# Patient Record
Sex: Female | Born: 1960 | Race: White | Hispanic: No | Marital: Married | Smoking: Current every day smoker
Health system: Southern US, Community
[De-identification: ages and names within clinical notes are randomized; demographics above are authoritative.]

## PROBLEM LIST (undated history)

## (undated) DIAGNOSIS — I890 Lymphedema, not elsewhere classified: Secondary | ICD-10-CM

## (undated) DIAGNOSIS — F329 Major depressive disorder, single episode, unspecified: Secondary | ICD-10-CM

## (undated) DIAGNOSIS — C50919 Malignant neoplasm of unspecified site of unspecified female breast: Secondary | ICD-10-CM

## (undated) DIAGNOSIS — N63 Unspecified lump in unspecified breast: Secondary | ICD-10-CM

## (undated) DIAGNOSIS — B009 Herpesviral infection, unspecified: Secondary | ICD-10-CM

## (undated) DIAGNOSIS — C7951 Secondary malignant neoplasm of bone: Secondary | ICD-10-CM

## (undated) DIAGNOSIS — N950 Postmenopausal bleeding: Secondary | ICD-10-CM

## (undated) DIAGNOSIS — A4902 Methicillin resistant Staphylococcus aureus infection, unspecified site: Secondary | ICD-10-CM

## (undated) DIAGNOSIS — F32A Depression, unspecified: Secondary | ICD-10-CM

## (undated) DIAGNOSIS — R238 Other skin changes: Secondary | ICD-10-CM

## (undated) DIAGNOSIS — A6000 Herpesviral infection of urogenital system, unspecified: Secondary | ICD-10-CM

## (undated) DIAGNOSIS — R609 Edema, unspecified: Secondary | ICD-10-CM

## (undated) DIAGNOSIS — E538 Deficiency of other specified B group vitamins: Secondary | ICD-10-CM

## (undated) DIAGNOSIS — I10 Essential (primary) hypertension: Secondary | ICD-10-CM

## (undated) DIAGNOSIS — T148XXA Other injury of unspecified body region, initial encounter: Secondary | ICD-10-CM

## (undated) DIAGNOSIS — F419 Anxiety disorder, unspecified: Secondary | ICD-10-CM

## (undated) HISTORY — DX: Herpesviral infection of urogenital system, unspecified: A60.00

## (undated) HISTORY — DX: Deficiency of other specified B group vitamins: E53.8

## (undated) HISTORY — PX: WISDOM TOOTH EXTRACTION: SHX21

## (undated) HISTORY — DX: Edema, unspecified: R60.9

## (undated) HISTORY — PX: TUBAL LIGATION: SHX77

## (undated) HISTORY — DX: Unspecified lump in unspecified breast: N63.0

## (undated) HISTORY — DX: Essential (primary) hypertension: I10

## (undated) HISTORY — DX: Major depressive disorder, single episode, unspecified: F32.9

## (undated) HISTORY — DX: Malignant neoplasm of unspecified site of unspecified female breast: C50.919

## (undated) HISTORY — DX: Methicillin resistant Staphylococcus aureus infection, unspecified site: A49.02

## (undated) HISTORY — DX: Lymphedema, not elsewhere classified: I89.0

## (undated) HISTORY — DX: Depression, unspecified: F32.A

## (undated) HISTORY — DX: Other injury of unspecified body region, initial encounter: T14.8XXA

## (undated) HISTORY — DX: Secondary malignant neoplasm of bone: C79.51

## (undated) HISTORY — DX: Postmenopausal bleeding: N95.0

## (undated) HISTORY — DX: Herpesviral infection, unspecified: B00.9

## (undated) HISTORY — DX: Other skin changes: R23.8

## (undated) HISTORY — DX: Anxiety disorder, unspecified: F41.9

---

## 2010-05-09 ENCOUNTER — Other Ambulatory Visit: Payer: Self-pay | Admitting: Family Medicine

## 2010-05-09 DIAGNOSIS — R234 Changes in skin texture: Secondary | ICD-10-CM

## 2010-05-23 ENCOUNTER — Ambulatory Visit
Admission: RE | Admit: 2010-05-23 | Discharge: 2010-05-23 | Disposition: A | Discharge status: Home / Self Care | Payer: Self-pay | Source: Ambulatory Visit | Attending: Family Medicine | Admitting: Family Medicine

## 2010-05-23 ENCOUNTER — Other Ambulatory Visit: Payer: Self-pay | Admitting: Family Medicine

## 2010-05-23 DIAGNOSIS — R234 Changes in skin texture: Secondary | ICD-10-CM

## 2010-05-28 ENCOUNTER — Other Ambulatory Visit: Payer: Self-pay | Admitting: Family Medicine

## 2010-05-28 ENCOUNTER — Ambulatory Visit
Admission: RE | Admit: 2010-05-28 | Discharge: 2010-05-28 | Disposition: A | Discharge status: Home / Self Care | Payer: Self-pay | Source: Ambulatory Visit | Attending: Family Medicine | Admitting: Family Medicine

## 2010-05-28 ENCOUNTER — Other Ambulatory Visit: Payer: Self-pay | Admitting: Diagnostic Radiology

## 2010-05-28 DIAGNOSIS — C50919 Malignant neoplasm of unspecified site of unspecified female breast: Secondary | ICD-10-CM

## 2010-05-28 DIAGNOSIS — N6320 Unspecified lump in the left breast, unspecified quadrant: Secondary | ICD-10-CM

## 2010-05-28 DIAGNOSIS — R234 Changes in skin texture: Secondary | ICD-10-CM

## 2010-05-28 HISTORY — DX: Malignant neoplasm of unspecified site of unspecified female breast: C50.919

## 2010-05-29 ENCOUNTER — Other Ambulatory Visit: Payer: Self-pay | Admitting: Family Medicine

## 2010-05-29 DIAGNOSIS — C50912 Malignant neoplasm of unspecified site of left female breast: Secondary | ICD-10-CM

## 2010-06-04 ENCOUNTER — Other Ambulatory Visit: Payer: Self-pay | Admitting: Physician Assistant

## 2010-06-04 ENCOUNTER — Ambulatory Visit
Admission: RE | Admit: 2010-06-04 | Discharge: 2010-06-04 | Disposition: A | Discharge status: Home / Self Care | Payer: Self-pay | Source: Ambulatory Visit | Attending: Family Medicine | Admitting: Family Medicine

## 2010-06-04 ENCOUNTER — Other Ambulatory Visit: Payer: Self-pay | Admitting: Family Medicine

## 2010-06-04 DIAGNOSIS — C50912 Malignant neoplasm of unspecified site of left female breast: Secondary | ICD-10-CM

## 2010-06-04 DIAGNOSIS — R928 Other abnormal and inconclusive findings on diagnostic imaging of breast: Secondary | ICD-10-CM

## 2010-06-04 MED ORDER — GADOBENATE DIMEGLUMINE 529 MG/ML IV SOLN
14.0000 mL | Freq: Once | INTRAVENOUS | Status: AC | PRN
  Administered 2010-06-04: 14 mL via INTRAVENOUS

## 2010-06-05 ENCOUNTER — Other Ambulatory Visit: Payer: Self-pay | Admitting: Diagnostic Radiology

## 2010-06-05 ENCOUNTER — Ambulatory Visit
Admission: RE | Admit: 2010-06-05 | Discharge: 2010-06-05 | Disposition: A | Discharge status: Home / Self Care | Payer: No Typology Code available for payment source | Source: Ambulatory Visit | Attending: Family Medicine | Admitting: Family Medicine

## 2010-06-05 DIAGNOSIS — R928 Other abnormal and inconclusive findings on diagnostic imaging of breast: Secondary | ICD-10-CM

## 2010-06-06 ENCOUNTER — Other Ambulatory Visit: Payer: Self-pay | Admitting: Oncology

## 2010-06-06 ENCOUNTER — Encounter (HOSPITAL_BASED_OUTPATIENT_CLINIC_OR_DEPARTMENT_OTHER): Payer: No Typology Code available for payment source | Admitting: Oncology

## 2010-06-06 DIAGNOSIS — F17200 Nicotine dependence, unspecified, uncomplicated: Secondary | ICD-10-CM

## 2010-06-06 DIAGNOSIS — C50919 Malignant neoplasm of unspecified site of unspecified female breast: Secondary | ICD-10-CM

## 2010-06-06 DIAGNOSIS — C50119 Malignant neoplasm of central portion of unspecified female breast: Secondary | ICD-10-CM

## 2010-06-06 LAB — COMPREHENSIVE METABOLIC PANEL
ALT: 38 U/L — ABNORMAL HIGH (ref 0–35)
AST: 66 U/L — ABNORMAL HIGH (ref 0–37)
Albumin: 4.2 g/dL (ref 3.5–5.2)
BUN: 7 mg/dL (ref 6–23)
CO2: 30 mEq/L (ref 19–32)
Calcium: 8.8 mg/dL (ref 8.4–10.5)
Chloride: 90 mEq/L — ABNORMAL LOW (ref 96–112)
Potassium: 3 mEq/L — ABNORMAL LOW (ref 3.5–5.3)

## 2010-06-06 LAB — CBC WITH DIFFERENTIAL/PLATELET
BASO%: 1.5 % (ref 0.0–2.0)
Basophils Absolute: 0.1 10*3/uL (ref 0.0–0.1)
EOS%: 1.2 % (ref 0.0–7.0)
HGB: 13.9 g/dL (ref 11.6–15.9)
MCH: 33.9 pg (ref 25.1–34.0)
MONO#: 0.6 10*3/uL (ref 0.1–0.9)
RDW: 13.6 % (ref 11.2–14.5)
WBC: 5.4 10*3/uL (ref 3.9–10.3)
lymph#: 1.3 10*3/uL (ref 0.9–3.3)

## 2010-06-13 ENCOUNTER — Encounter (HOSPITAL_COMMUNITY): Payer: Self-pay

## 2010-06-13 ENCOUNTER — Encounter (HOSPITAL_COMMUNITY)
Admission: RE | Admit: 2010-06-13 | Discharge: 2010-06-13 | Disposition: A | Discharge status: Home / Self Care | Payer: No Typology Code available for payment source | Source: Ambulatory Visit | Attending: Oncology | Admitting: Oncology

## 2010-06-13 DIAGNOSIS — R599 Enlarged lymph nodes, unspecified: Secondary | ICD-10-CM | POA: Insufficient documentation

## 2010-06-13 DIAGNOSIS — C50919 Malignant neoplasm of unspecified site of unspecified female breast: Secondary | ICD-10-CM | POA: Insufficient documentation

## 2010-06-13 DIAGNOSIS — C773 Secondary and unspecified malignant neoplasm of axilla and upper limb lymph nodes: Secondary | ICD-10-CM | POA: Insufficient documentation

## 2010-06-13 DIAGNOSIS — Z79899 Other long term (current) drug therapy: Secondary | ICD-10-CM | POA: Insufficient documentation

## 2010-06-13 DIAGNOSIS — J984 Other disorders of lung: Secondary | ICD-10-CM | POA: Insufficient documentation

## 2010-06-13 MED ORDER — FLUDEOXYGLUCOSE F - 18 (FDG) INJECTION
16.4000 | Freq: Once | INTRAVENOUS | Status: AC | PRN
  Administered 2010-06-13: 16.4 via INTRAVENOUS

## 2010-06-15 ENCOUNTER — Encounter (HOSPITAL_BASED_OUTPATIENT_CLINIC_OR_DEPARTMENT_OTHER): Payer: No Typology Code available for payment source | Admitting: Oncology

## 2010-06-15 DIAGNOSIS — Z5111 Encounter for antineoplastic chemotherapy: Secondary | ICD-10-CM

## 2010-06-15 DIAGNOSIS — C773 Secondary and unspecified malignant neoplasm of axilla and upper limb lymph nodes: Secondary | ICD-10-CM

## 2010-06-15 DIAGNOSIS — C50119 Malignant neoplasm of central portion of unspecified female breast: Secondary | ICD-10-CM

## 2010-06-16 ENCOUNTER — Encounter (HOSPITAL_BASED_OUTPATIENT_CLINIC_OR_DEPARTMENT_OTHER): Payer: No Typology Code available for payment source | Admitting: Oncology

## 2010-06-16 DIAGNOSIS — C50119 Malignant neoplasm of central portion of unspecified female breast: Secondary | ICD-10-CM

## 2010-06-16 DIAGNOSIS — Z5189 Encounter for other specified aftercare: Secondary | ICD-10-CM

## 2010-06-16 DIAGNOSIS — C773 Secondary and unspecified malignant neoplasm of axilla and upper limb lymph nodes: Secondary | ICD-10-CM

## 2010-06-20 ENCOUNTER — Ambulatory Visit (HOSPITAL_COMMUNITY): Payer: No Typology Code available for payment source | Attending: Oncology

## 2010-06-26 ENCOUNTER — Other Ambulatory Visit: Payer: Self-pay | Admitting: Oncology

## 2010-06-26 ENCOUNTER — Encounter (HOSPITAL_BASED_OUTPATIENT_CLINIC_OR_DEPARTMENT_OTHER): Payer: No Typology Code available for payment source | Admitting: Oncology

## 2010-06-26 DIAGNOSIS — C50119 Malignant neoplasm of central portion of unspecified female breast: Secondary | ICD-10-CM

## 2010-06-26 LAB — CBC WITH DIFFERENTIAL/PLATELET
Basophils Absolute: 0 10*3/uL (ref 0.0–0.1)
Eosinophils Absolute: 0 10*3/uL (ref 0.0–0.5)
MCH: 33.8 pg (ref 25.1–34.0)
MONO#: 0.8 10*3/uL (ref 0.1–0.9)
NEUT%: 88.1 % — ABNORMAL HIGH (ref 38.4–76.8)
RDW: 13.6 % (ref 11.2–14.5)
lymph#: 1.5 10*3/uL (ref 0.9–3.3)

## 2010-06-26 LAB — COMPREHENSIVE METABOLIC PANEL
Albumin: 4.2 g/dL (ref 3.5–5.2)
BUN: 3 mg/dL — ABNORMAL LOW (ref 6–23)
CO2: 28 mEq/L (ref 19–32)
Calcium: 9.3 mg/dL (ref 8.4–10.5)
Chloride: 86 mEq/L — ABNORMAL LOW (ref 96–112)
Creatinine, Ser: 0.72 mg/dL (ref 0.40–1.20)
Potassium: 3.2 mEq/L — ABNORMAL LOW (ref 3.5–5.3)

## 2010-06-29 ENCOUNTER — Encounter (HOSPITAL_BASED_OUTPATIENT_CLINIC_OR_DEPARTMENT_OTHER): Payer: No Typology Code available for payment source | Admitting: Oncology

## 2010-06-29 DIAGNOSIS — C50119 Malignant neoplasm of central portion of unspecified female breast: Secondary | ICD-10-CM

## 2010-06-29 DIAGNOSIS — Z5111 Encounter for antineoplastic chemotherapy: Secondary | ICD-10-CM

## 2010-06-29 DIAGNOSIS — C773 Secondary and unspecified malignant neoplasm of axilla and upper limb lymph nodes: Secondary | ICD-10-CM

## 2010-06-30 ENCOUNTER — Encounter: Payer: No Typology Code available for payment source | Admitting: Oncology

## 2010-06-30 DIAGNOSIS — Z5189 Encounter for other specified aftercare: Secondary | ICD-10-CM

## 2010-07-06 ENCOUNTER — Other Ambulatory Visit: Payer: Self-pay | Admitting: Oncology

## 2010-07-06 ENCOUNTER — Encounter (HOSPITAL_BASED_OUTPATIENT_CLINIC_OR_DEPARTMENT_OTHER): Payer: No Typology Code available for payment source | Admitting: Oncology

## 2010-07-06 DIAGNOSIS — C773 Secondary and unspecified malignant neoplasm of axilla and upper limb lymph nodes: Secondary | ICD-10-CM

## 2010-07-06 DIAGNOSIS — F17200 Nicotine dependence, unspecified, uncomplicated: Secondary | ICD-10-CM

## 2010-07-06 DIAGNOSIS — C50119 Malignant neoplasm of central portion of unspecified female breast: Secondary | ICD-10-CM

## 2010-07-06 LAB — BASIC METABOLIC PANEL
CO2: 33 mEq/L — ABNORMAL HIGH (ref 19–32)
Chloride: 87 mEq/L — ABNORMAL LOW (ref 96–112)
Creatinine, Ser: 0.64 mg/dL (ref 0.40–1.20)
Potassium: 2.6 mEq/L — CL (ref 3.5–5.3)

## 2010-07-06 LAB — CBC WITH DIFFERENTIAL/PLATELET
BASO%: 0.3 % (ref 0.0–2.0)
Basophils Absolute: 0.1 10*3/uL (ref 0.0–0.1)
EOS%: 0.1 % (ref 0.0–7.0)
HCT: 36.7 % (ref 34.8–46.6)
HGB: 12.6 g/dL (ref 11.6–15.9)
MCH: 33.7 pg (ref 25.1–34.0)
MCHC: 34.4 g/dL (ref 31.5–36.0)
MONO#: 2.6 10*3/uL — ABNORMAL HIGH (ref 0.1–0.9)
NEUT%: 65.8 % (ref 38.4–76.8)
RDW: 13.6 % (ref 11.2–14.5)
WBC: 15.6 10*3/uL — ABNORMAL HIGH (ref 3.9–10.3)
lymph#: 2.7 10*3/uL (ref 0.9–3.3)

## 2010-07-13 ENCOUNTER — Other Ambulatory Visit: Payer: Self-pay | Admitting: Physician Assistant

## 2010-07-13 ENCOUNTER — Encounter (HOSPITAL_BASED_OUTPATIENT_CLINIC_OR_DEPARTMENT_OTHER): Payer: Self-pay | Admitting: Oncology

## 2010-07-13 DIAGNOSIS — C773 Secondary and unspecified malignant neoplasm of axilla and upper limb lymph nodes: Secondary | ICD-10-CM

## 2010-07-13 DIAGNOSIS — F17200 Nicotine dependence, unspecified, uncomplicated: Secondary | ICD-10-CM

## 2010-07-13 DIAGNOSIS — C50119 Malignant neoplasm of central portion of unspecified female breast: Secondary | ICD-10-CM

## 2010-07-13 DIAGNOSIS — Z5189 Encounter for other specified aftercare: Secondary | ICD-10-CM

## 2010-07-13 DIAGNOSIS — Z5111 Encounter for antineoplastic chemotherapy: Secondary | ICD-10-CM

## 2010-07-13 LAB — CBC WITH DIFFERENTIAL/PLATELET
Basophils Absolute: 0 10*3/uL (ref 0.0–0.1)
EOS%: 0 % (ref 0.0–7.0)
Eosinophils Absolute: 0 10*3/uL (ref 0.0–0.5)
HGB: 12.4 g/dL (ref 11.6–15.9)
LYMPH%: 4.4 % — ABNORMAL LOW (ref 14.0–49.7)
MCH: 32 pg (ref 25.1–34.0)
MCV: 92.8 fL (ref 79.5–101.0)
MONO%: 2.3 % (ref 0.0–14.0)
NEUT#: 27.8 10*3/uL — ABNORMAL HIGH (ref 1.5–6.5)
NEUT%: 93.2 % — ABNORMAL HIGH (ref 38.4–76.8)
Platelets: 211 10*3/uL (ref 145–400)

## 2010-07-13 LAB — COMPREHENSIVE METABOLIC PANEL
Alkaline Phosphatase: 119 U/L — ABNORMAL HIGH (ref 39–117)
BUN: 7 mg/dL (ref 6–23)
CO2: 17 mEq/L — ABNORMAL LOW (ref 19–32)
Creatinine, Ser: 0.52 mg/dL (ref 0.40–1.20)
Glucose, Bld: 124 mg/dL — ABNORMAL HIGH (ref 70–99)
Total Bilirubin: 0.3 mg/dL (ref 0.3–1.2)
Total Protein: 6.4 g/dL (ref 6.0–8.3)

## 2010-07-14 ENCOUNTER — Encounter (HOSPITAL_BASED_OUTPATIENT_CLINIC_OR_DEPARTMENT_OTHER): Payer: Self-pay | Admitting: Oncology

## 2010-07-14 DIAGNOSIS — Z5189 Encounter for other specified aftercare: Secondary | ICD-10-CM

## 2010-07-14 DIAGNOSIS — C773 Secondary and unspecified malignant neoplasm of axilla and upper limb lymph nodes: Secondary | ICD-10-CM

## 2010-07-14 DIAGNOSIS — C50119 Malignant neoplasm of central portion of unspecified female breast: Secondary | ICD-10-CM

## 2010-07-20 ENCOUNTER — Other Ambulatory Visit: Payer: Self-pay | Admitting: Physician Assistant

## 2010-07-20 ENCOUNTER — Encounter (HOSPITAL_BASED_OUTPATIENT_CLINIC_OR_DEPARTMENT_OTHER): Payer: Self-pay | Admitting: Oncology

## 2010-07-20 ENCOUNTER — Encounter (HOSPITAL_COMMUNITY)
Admission: RE | Admit: 2010-07-20 | Discharge: 2010-07-20 | Disposition: A | Discharge status: Home / Self Care | Payer: Self-pay | Source: Ambulatory Visit | Attending: Oncology | Admitting: Oncology

## 2010-07-20 DIAGNOSIS — D649 Anemia, unspecified: Secondary | ICD-10-CM | POA: Insufficient documentation

## 2010-07-20 DIAGNOSIS — C50119 Malignant neoplasm of central portion of unspecified female breast: Secondary | ICD-10-CM

## 2010-07-20 DIAGNOSIS — Z170 Estrogen receptor positive status [ER+]: Secondary | ICD-10-CM

## 2010-07-20 DIAGNOSIS — C773 Secondary and unspecified malignant neoplasm of axilla and upper limb lymph nodes: Secondary | ICD-10-CM

## 2010-07-20 DIAGNOSIS — F17200 Nicotine dependence, unspecified, uncomplicated: Secondary | ICD-10-CM

## 2010-07-20 DIAGNOSIS — Z5189 Encounter for other specified aftercare: Secondary | ICD-10-CM

## 2010-07-20 LAB — CBC WITH DIFFERENTIAL/PLATELET
Basophils Absolute: 0.1 10*3/uL (ref 0.0–0.1)
Eosinophils Absolute: 0 10*3/uL (ref 0.0–0.5)
HGB: 8.4 g/dL — ABNORMAL LOW (ref 11.6–15.9)
LYMPH%: 11.5 % — ABNORMAL LOW (ref 14.0–49.7)
MONO#: 2.4 10*3/uL — ABNORMAL HIGH (ref 0.1–0.9)
NEUT#: 22.5 10*3/uL — ABNORMAL HIGH (ref 1.5–6.5)
Platelets: 416 10*3/uL — ABNORMAL HIGH (ref 145–400)
RBC: 2.53 10*6/uL — ABNORMAL LOW (ref 3.70–5.45)
WBC: 28.4 10*3/uL — ABNORMAL HIGH (ref 3.9–10.3)
nRBC: 0 % (ref 0–0)

## 2010-07-20 LAB — TECHNOLOGIST REVIEW: Technologist Review: 4

## 2010-07-21 ENCOUNTER — Encounter: Payer: Self-pay | Admitting: Oncology

## 2010-07-21 DIAGNOSIS — D649 Anemia, unspecified: Secondary | ICD-10-CM

## 2010-07-22 LAB — CROSSMATCH
ABO/RH(D): O POS
Antibody Screen: NEGATIVE
Unit division: 0

## 2010-07-24 ENCOUNTER — Encounter (HOSPITAL_COMMUNITY): Payer: Self-pay

## 2010-07-30 ENCOUNTER — Encounter (HOSPITAL_COMMUNITY): Payer: Self-pay | Attending: Oncology

## 2010-07-30 DIAGNOSIS — D649 Anemia, unspecified: Secondary | ICD-10-CM | POA: Insufficient documentation

## 2010-08-03 ENCOUNTER — Other Ambulatory Visit: Payer: Self-pay | Admitting: Physician Assistant

## 2010-08-03 ENCOUNTER — Encounter (HOSPITAL_BASED_OUTPATIENT_CLINIC_OR_DEPARTMENT_OTHER): Payer: Self-pay | Admitting: Oncology

## 2010-08-03 ENCOUNTER — Encounter (HOSPITAL_COMMUNITY): Payer: Self-pay

## 2010-08-03 DIAGNOSIS — C773 Secondary and unspecified malignant neoplasm of axilla and upper limb lymph nodes: Secondary | ICD-10-CM

## 2010-08-03 DIAGNOSIS — F17200 Nicotine dependence, unspecified, uncomplicated: Secondary | ICD-10-CM

## 2010-08-03 DIAGNOSIS — C50119 Malignant neoplasm of central portion of unspecified female breast: Secondary | ICD-10-CM

## 2010-08-03 DIAGNOSIS — Z5111 Encounter for antineoplastic chemotherapy: Secondary | ICD-10-CM

## 2010-08-03 DIAGNOSIS — Z5189 Encounter for other specified aftercare: Secondary | ICD-10-CM

## 2010-08-03 DIAGNOSIS — A4902 Methicillin resistant Staphylococcus aureus infection, unspecified site: Secondary | ICD-10-CM

## 2010-08-03 LAB — CBC WITH DIFFERENTIAL/PLATELET
Basophils Absolute: 0 10*3/uL (ref 0.0–0.1)
EOS%: 0.3 % (ref 0.0–7.0)
HCT: 35.2 % (ref 34.8–46.6)
HGB: 11.7 g/dL (ref 11.6–15.9)
LYMPH%: 1.9 % — ABNORMAL LOW (ref 14.0–49.7)
MCH: 31.3 pg (ref 25.1–34.0)
MCV: 93.6 fL (ref 79.5–101.0)
MONO%: 0.2 % (ref 0.0–14.0)
NEUT%: 97.5 % — ABNORMAL HIGH (ref 38.4–76.8)

## 2010-08-03 LAB — COMPREHENSIVE METABOLIC PANEL
AST: 20 U/L (ref 0–37)
Alkaline Phosphatase: 77 U/L (ref 39–117)
BUN: 10 mg/dL (ref 6–23)
Calcium: 9.4 mg/dL (ref 8.4–10.5)
Creatinine, Ser: 0.65 mg/dL (ref 0.50–1.10)

## 2010-08-04 ENCOUNTER — Encounter: Payer: Self-pay | Admitting: Oncology

## 2010-08-09 ENCOUNTER — Ambulatory Visit (HOSPITAL_COMMUNITY)
Admission: RE | Admit: 2010-08-09 | Discharge: 2010-08-09 | Disposition: A | Discharge status: Home / Self Care | Payer: Self-pay | Source: Ambulatory Visit | Attending: Oncology | Admitting: Oncology

## 2010-08-09 ENCOUNTER — Other Ambulatory Visit (HOSPITAL_COMMUNITY): Payer: Self-pay

## 2010-08-09 DIAGNOSIS — C50919 Malignant neoplasm of unspecified site of unspecified female breast: Secondary | ICD-10-CM | POA: Insufficient documentation

## 2010-08-09 DIAGNOSIS — Z5111 Encounter for antineoplastic chemotherapy: Secondary | ICD-10-CM

## 2010-08-09 DIAGNOSIS — I10 Essential (primary) hypertension: Secondary | ICD-10-CM | POA: Insufficient documentation

## 2010-08-10 ENCOUNTER — Other Ambulatory Visit: Payer: Self-pay | Admitting: Physician Assistant

## 2010-08-10 ENCOUNTER — Encounter (HOSPITAL_BASED_OUTPATIENT_CLINIC_OR_DEPARTMENT_OTHER): Payer: Self-pay | Admitting: Oncology

## 2010-08-10 DIAGNOSIS — Z170 Estrogen receptor positive status [ER+]: Secondary | ICD-10-CM

## 2010-08-10 DIAGNOSIS — Z5189 Encounter for other specified aftercare: Secondary | ICD-10-CM

## 2010-08-10 DIAGNOSIS — F17200 Nicotine dependence, unspecified, uncomplicated: Secondary | ICD-10-CM

## 2010-08-10 DIAGNOSIS — C50119 Malignant neoplasm of central portion of unspecified female breast: Secondary | ICD-10-CM

## 2010-08-10 DIAGNOSIS — C773 Secondary and unspecified malignant neoplasm of axilla and upper limb lymph nodes: Secondary | ICD-10-CM

## 2010-08-10 DIAGNOSIS — D709 Neutropenia, unspecified: Secondary | ICD-10-CM

## 2010-08-10 DIAGNOSIS — D696 Thrombocytopenia, unspecified: Secondary | ICD-10-CM

## 2010-08-10 LAB — CBC WITH DIFFERENTIAL/PLATELET
Basophils Absolute: 0 10*3/uL (ref 0.0–0.1)
EOS%: 3.6 % (ref 0.0–7.0)
HCT: 28.2 % — ABNORMAL LOW (ref 34.8–46.6)
HGB: 9.4 g/dL — ABNORMAL LOW (ref 11.6–15.9)
MCH: 31 pg (ref 25.1–34.0)
MCV: 93.1 fL (ref 79.5–101.0)
NEUT%: 18.2 % — ABNORMAL LOW (ref 38.4–76.8)
lymph#: 0.4 10*3/uL — ABNORMAL LOW (ref 0.9–3.3)

## 2010-08-17 ENCOUNTER — Other Ambulatory Visit: Payer: Self-pay | Admitting: Physician Assistant

## 2010-08-17 ENCOUNTER — Encounter (HOSPITAL_BASED_OUTPATIENT_CLINIC_OR_DEPARTMENT_OTHER): Payer: Self-pay | Admitting: Oncology

## 2010-08-17 DIAGNOSIS — C50119 Malignant neoplasm of central portion of unspecified female breast: Secondary | ICD-10-CM

## 2010-08-17 DIAGNOSIS — Z170 Estrogen receptor positive status [ER+]: Secondary | ICD-10-CM

## 2010-08-17 DIAGNOSIS — Z5111 Encounter for antineoplastic chemotherapy: Secondary | ICD-10-CM

## 2010-08-17 LAB — CBC WITH DIFFERENTIAL/PLATELET
Basophils Absolute: 0 10*3/uL (ref 0.0–0.1)
EOS%: 0.1 % (ref 0.0–7.0)
HCT: 27.2 % — ABNORMAL LOW (ref 34.8–46.6)
HGB: 9.1 g/dL — ABNORMAL LOW (ref 11.6–15.9)
LYMPH%: 20.7 % (ref 14.0–49.7)
MCH: 31.6 pg (ref 25.1–34.0)
MCV: 94.4 fL (ref 79.5–101.0)
MONO%: 15.9 % — ABNORMAL HIGH (ref 0.0–14.0)
NEUT%: 63.2 % (ref 38.4–76.8)
Platelets: 277 10*3/uL (ref 145–400)
RDW: 23.1 % — ABNORMAL HIGH (ref 11.2–14.5)

## 2010-08-17 LAB — COMPREHENSIVE METABOLIC PANEL
Alkaline Phosphatase: 85 U/L (ref 39–117)
BUN: 7 mg/dL (ref 6–23)
Creatinine, Ser: 0.61 mg/dL (ref 0.50–1.10)
Glucose, Bld: 78 mg/dL (ref 70–99)
Total Bilirubin: 0.2 mg/dL — ABNORMAL LOW (ref 0.3–1.2)

## 2010-08-18 ENCOUNTER — Encounter (HOSPITAL_BASED_OUTPATIENT_CLINIC_OR_DEPARTMENT_OTHER): Payer: Self-pay | Admitting: Oncology

## 2010-08-18 DIAGNOSIS — Z170 Estrogen receptor positive status [ER+]: Secondary | ICD-10-CM

## 2010-08-18 DIAGNOSIS — C50119 Malignant neoplasm of central portion of unspecified female breast: Secondary | ICD-10-CM

## 2010-08-24 ENCOUNTER — Other Ambulatory Visit: Payer: Self-pay | Admitting: Physician Assistant

## 2010-08-24 ENCOUNTER — Encounter (HOSPITAL_BASED_OUTPATIENT_CLINIC_OR_DEPARTMENT_OTHER): Payer: Self-pay | Admitting: Oncology

## 2010-08-24 DIAGNOSIS — Z170 Estrogen receptor positive status [ER+]: Secondary | ICD-10-CM

## 2010-08-24 DIAGNOSIS — C50119 Malignant neoplasm of central portion of unspecified female breast: Secondary | ICD-10-CM

## 2010-08-24 LAB — CBC WITH DIFFERENTIAL/PLATELET
Basophils Absolute: 0 10*3/uL (ref 0.0–0.1)
Eosinophils Absolute: 0 10*3/uL (ref 0.0–0.5)
HGB: 7.8 g/dL — ABNORMAL LOW (ref 11.6–15.9)
LYMPH%: 60 % — ABNORMAL HIGH (ref 14.0–49.7)
MCV: 94.8 fL (ref 79.5–101.0)
MONO#: 0 10*3/uL — ABNORMAL LOW (ref 0.1–0.9)
MONO%: 2.5 % (ref 0.0–14.0)
NEUT#: 0.2 10*3/uL — CL (ref 1.5–6.5)
Platelets: 156 10*3/uL (ref 145–400)

## 2010-08-27 ENCOUNTER — Encounter (HOSPITAL_BASED_OUTPATIENT_CLINIC_OR_DEPARTMENT_OTHER): Payer: Self-pay | Admitting: Oncology

## 2010-08-27 ENCOUNTER — Other Ambulatory Visit: Payer: Self-pay | Admitting: Oncology

## 2010-08-27 ENCOUNTER — Encounter (HOSPITAL_COMMUNITY): Payer: Self-pay

## 2010-08-27 ENCOUNTER — Other Ambulatory Visit: Payer: Self-pay | Admitting: Physician Assistant

## 2010-08-27 ENCOUNTER — Inpatient Hospital Stay (HOSPITAL_COMMUNITY)
Admission: AD | Admit: 2010-08-27 | Discharge: 2010-09-06 | DRG: 600 | Disposition: A | Discharge status: Home / Self Care | Payer: Self-pay | Source: Ambulatory Visit | Attending: Oncology | Admitting: Oncology

## 2010-08-27 ENCOUNTER — Encounter (HOSPITAL_COMMUNITY)
Admission: RE | Admit: 2010-08-27 | Discharge: 2010-08-27 | Disposition: A | Discharge status: Home / Self Care | Payer: Self-pay | Source: Ambulatory Visit | Attending: Oncology | Admitting: Oncology

## 2010-08-27 DIAGNOSIS — I10 Essential (primary) hypertension: Secondary | ICD-10-CM | POA: Diagnosis present

## 2010-08-27 DIAGNOSIS — A4902 Methicillin resistant Staphylococcus aureus infection, unspecified site: Secondary | ICD-10-CM | POA: Diagnosis present

## 2010-08-27 DIAGNOSIS — R059 Cough, unspecified: Secondary | ICD-10-CM | POA: Diagnosis not present

## 2010-08-27 DIAGNOSIS — Z170 Estrogen receptor positive status [ER+]: Secondary | ICD-10-CM

## 2010-08-27 DIAGNOSIS — N61 Mastitis without abscess: Principal | ICD-10-CM | POA: Diagnosis present

## 2010-08-27 DIAGNOSIS — R062 Wheezing: Secondary | ICD-10-CM | POA: Diagnosis not present

## 2010-08-27 DIAGNOSIS — F17200 Nicotine dependence, unspecified, uncomplicated: Secondary | ICD-10-CM | POA: Diagnosis present

## 2010-08-27 DIAGNOSIS — D649 Anemia, unspecified: Secondary | ICD-10-CM | POA: Insufficient documentation

## 2010-08-27 DIAGNOSIS — L03119 Cellulitis of unspecified part of limb: Secondary | ICD-10-CM | POA: Diagnosis present

## 2010-08-27 DIAGNOSIS — D61818 Other pancytopenia: Secondary | ICD-10-CM | POA: Diagnosis present

## 2010-08-27 DIAGNOSIS — C50919 Malignant neoplasm of unspecified site of unspecified female breast: Secondary | ICD-10-CM | POA: Diagnosis present

## 2010-08-27 DIAGNOSIS — L02419 Cutaneous abscess of limb, unspecified: Secondary | ICD-10-CM | POA: Diagnosis present

## 2010-08-27 DIAGNOSIS — Z9221 Personal history of antineoplastic chemotherapy: Secondary | ICD-10-CM

## 2010-08-27 DIAGNOSIS — C50119 Malignant neoplasm of central portion of unspecified female breast: Secondary | ICD-10-CM

## 2010-08-27 DIAGNOSIS — N390 Urinary tract infection, site not specified: Secondary | ICD-10-CM

## 2010-08-27 DIAGNOSIS — M79609 Pain in unspecified limb: Secondary | ICD-10-CM | POA: Diagnosis present

## 2010-08-27 DIAGNOSIS — IMO0002 Reserved for concepts with insufficient information to code with codable children: Secondary | ICD-10-CM | POA: Diagnosis present

## 2010-08-27 DIAGNOSIS — R7881 Bacteremia: Secondary | ICD-10-CM

## 2010-08-27 DIAGNOSIS — A4901 Methicillin susceptible Staphylococcus aureus infection, unspecified site: Secondary | ICD-10-CM

## 2010-08-27 DIAGNOSIS — R05 Cough: Secondary | ICD-10-CM | POA: Diagnosis not present

## 2010-08-27 LAB — CBC WITH DIFFERENTIAL/PLATELET
Basophils Absolute: 0 10*3/uL (ref 0.0–0.1)
EOS%: 0 % (ref 0.0–7.0)
Eosinophils Absolute: 0 10*3/uL (ref 0.0–0.5)
HCT: 22 % — ABNORMAL LOW (ref 34.8–46.6)
HGB: 7.6 g/dL — ABNORMAL LOW (ref 11.6–15.9)
MCH: 33.3 pg (ref 25.1–34.0)
MCV: 97.1 fL (ref 79.5–101.0)
NEUT#: 0.6 10*3/uL — ABNORMAL LOW (ref 1.5–6.5)
NEUT%: 76.5 % (ref 38.4–76.8)
lymph#: 0.1 10*3/uL — ABNORMAL LOW (ref 0.9–3.3)

## 2010-08-27 LAB — HOLD TUBE, BLOOD BANK

## 2010-08-28 DIAGNOSIS — C50919 Malignant neoplasm of unspecified site of unspecified female breast: Secondary | ICD-10-CM

## 2010-08-28 DIAGNOSIS — A4902 Methicillin resistant Staphylococcus aureus infection, unspecified site: Secondary | ICD-10-CM

## 2010-08-28 LAB — CROSSMATCH
Antibody Screen: NEGATIVE
Unit division: 0

## 2010-08-28 LAB — COMPREHENSIVE METABOLIC PANEL
ALT: 16 U/L (ref 0–35)
AST: 13 U/L (ref 0–37)
Albumin: 2.7 g/dL — ABNORMAL LOW (ref 3.5–5.2)
CO2: 25 mEq/L (ref 19–32)
Calcium: 9 mg/dL (ref 8.4–10.5)
Chloride: 105 mEq/L (ref 96–112)
Sodium: 139 mEq/L (ref 135–145)

## 2010-08-28 LAB — CBC
MCH: 31.4 pg (ref 26.0–34.0)
Platelets: 58 10*3/uL — ABNORMAL LOW (ref 150–400)
RBC: 2.87 MIL/uL — ABNORMAL LOW (ref 3.87–5.11)
RDW: 21.7 % — ABNORMAL HIGH (ref 11.5–15.5)
WBC: 3 10*3/uL — ABNORMAL LOW (ref 4.0–10.5)

## 2010-08-29 ENCOUNTER — Inpatient Hospital Stay (HOSPITAL_COMMUNITY): Payer: Self-pay

## 2010-08-29 DIAGNOSIS — R21 Rash and other nonspecific skin eruption: Secondary | ICD-10-CM

## 2010-08-29 DIAGNOSIS — D61818 Other pancytopenia: Secondary | ICD-10-CM

## 2010-08-29 DIAGNOSIS — C50919 Malignant neoplasm of unspecified site of unspecified female breast: Secondary | ICD-10-CM

## 2010-08-29 DIAGNOSIS — A419 Sepsis, unspecified organism: Secondary | ICD-10-CM

## 2010-08-29 LAB — WOUND CULTURE

## 2010-08-29 LAB — CBC
HCT: 25.1 % — ABNORMAL LOW (ref 36.0–46.0)
Hemoglobin: 8.5 g/dL — ABNORMAL LOW (ref 12.0–15.0)
MCH: 31.5 pg (ref 26.0–34.0)
MCHC: 33.9 g/dL (ref 30.0–36.0)
MCV: 93 fL (ref 78.0–100.0)
RBC: 2.7 MIL/uL — ABNORMAL LOW (ref 3.87–5.11)

## 2010-08-30 LAB — CREATININE, SERUM: GFR calc non Af Amer: >60 mL/min (ref >60)

## 2010-08-30 LAB — CBC
HCT: 25.3 % — ABNORMAL LOW (ref 36.0–46.0)
Hemoglobin: 8.5 g/dL — ABNORMAL LOW (ref 12.0–15.0)
MCH: 31.5 pg (ref 26.0–34.0)
MCHC: 33.6 g/dL (ref 30.0–36.0)

## 2010-08-31 ENCOUNTER — Inpatient Hospital Stay (HOSPITAL_COMMUNITY): Payer: Self-pay

## 2010-08-31 DIAGNOSIS — IMO0002 Reserved for concepts with insufficient information to code with codable children: Secondary | ICD-10-CM

## 2010-08-31 DIAGNOSIS — C50919 Malignant neoplasm of unspecified site of unspecified female breast: Secondary | ICD-10-CM

## 2010-08-31 DIAGNOSIS — L03119 Cellulitis of unspecified part of limb: Secondary | ICD-10-CM

## 2010-08-31 DIAGNOSIS — A4902 Methicillin resistant Staphylococcus aureus infection, unspecified site: Secondary | ICD-10-CM

## 2010-08-31 DIAGNOSIS — Z1611 Resistance to penicillins: Secondary | ICD-10-CM

## 2010-08-31 DIAGNOSIS — L02419 Cutaneous abscess of limb, unspecified: Secondary | ICD-10-CM

## 2010-08-31 LAB — BASIC METABOLIC PANEL
CO2: 24 mEq/L (ref 19–32)
Chloride: 109 mEq/L (ref 96–112)
Creatinine, Ser: 0.48 mg/dL — ABNORMAL LOW (ref 0.50–1.10)
Potassium: 3.6 mEq/L (ref 3.5–5.1)

## 2010-08-31 LAB — CBC
HCT: 27.8 % — ABNORMAL LOW (ref 36.0–46.0)
MCV: 94.6 fL (ref 78.0–100.0)
Platelets: 111 10*3/uL — ABNORMAL LOW (ref 150–400)
RBC: 2.94 MIL/uL — ABNORMAL LOW (ref 3.87–5.11)
WBC: 6.5 10*3/uL (ref 4.0–10.5)

## 2010-09-01 DIAGNOSIS — C50919 Malignant neoplasm of unspecified site of unspecified female breast: Secondary | ICD-10-CM

## 2010-09-01 LAB — CBC
Hemoglobin: 10.2 g/dL — ABNORMAL LOW (ref 12.0–15.0)
MCH: 32 pg (ref 26.0–34.0)
Platelets: 188 10*3/uL (ref 150–400)
RBC: 3.19 MIL/uL — ABNORMAL LOW (ref 3.87–5.11)
WBC: 5.5 10*3/uL (ref 4.0–10.5)

## 2010-09-01 LAB — DIFFERENTIAL
Basophils Relative: 0 % (ref 0–1)
Eosinophils Relative: 0 % (ref 0–5)
Monocytes Absolute: 1.1 10*3/uL — ABNORMAL HIGH (ref 0.1–1.0)
Monocytes Relative: 20 % — ABNORMAL HIGH (ref 3–12)
Neutrophils Relative %: 67 % (ref 43–77)

## 2010-09-02 DIAGNOSIS — C50919 Malignant neoplasm of unspecified site of unspecified female breast: Secondary | ICD-10-CM

## 2010-09-02 LAB — CBC
MCH: 31.5 pg (ref 26.0–34.0)
MCHC: 32.7 g/dL (ref 30.0–36.0)
MCV: 96.4 fL (ref 78.0–100.0)
Platelets: 215 10*3/uL (ref 150–400)

## 2010-09-02 LAB — DIFFERENTIAL
Basophils Absolute: 0 10*3/uL (ref 0.0–0.1)
Lymphs Abs: 1.2 10*3/uL (ref 0.7–4.0)
Monocytes Absolute: 1.5 10*3/uL — ABNORMAL HIGH (ref 0.1–1.0)
Neutro Abs: 5.2 10*3/uL (ref 1.7–7.7)

## 2010-09-03 ENCOUNTER — Inpatient Hospital Stay (HOSPITAL_COMMUNITY): Payer: Self-pay

## 2010-09-03 DIAGNOSIS — D61818 Other pancytopenia: Secondary | ICD-10-CM

## 2010-09-03 DIAGNOSIS — C50919 Malignant neoplasm of unspecified site of unspecified female breast: Secondary | ICD-10-CM

## 2010-09-03 DIAGNOSIS — A4902 Methicillin resistant Staphylococcus aureus infection, unspecified site: Secondary | ICD-10-CM

## 2010-09-03 LAB — DIFFERENTIAL
Basophils Relative: 1 % (ref 0–1)
Eosinophils Absolute: 0 10*3/uL (ref 0.0–0.7)
Eosinophils Relative: 0 % (ref 0–5)
Lymphs Abs: 1.2 10*3/uL (ref 0.7–4.0)
Monocytes Absolute: 0.7 10*3/uL (ref 0.1–1.0)
Neutrophils Relative %: 70 % (ref 43–77)

## 2010-09-03 LAB — COMPREHENSIVE METABOLIC PANEL
CO2: 28 mEq/L (ref 19–32)
Calcium: 8.4 mg/dL (ref 8.4–10.5)
Creatinine, Ser: 0.67 mg/dL (ref 0.50–1.10)
GFR calc Af Amer: >60 mL/min (ref >60)
GFR calc non Af Amer: >60 mL/min (ref >60)
Glucose, Bld: 86 mg/dL (ref 70–99)
Sodium: 140 mEq/L (ref 135–145)
Total Protein: 5.1 g/dL — ABNORMAL LOW (ref 6.0–8.3)

## 2010-09-03 LAB — CBC
Hemoglobin: 9.5 g/dL — ABNORMAL LOW (ref 12.0–15.0)
MCH: 31.3 pg (ref 26.0–34.0)
MCHC: 31.9 g/dL (ref 30.0–36.0)
MCV: 98 fL (ref 78.0–100.0)
RBC: 3.04 MIL/uL — ABNORMAL LOW (ref 3.87–5.11)

## 2010-09-03 NOTE — Consult Note (Signed)
NAMEJOELEEN, WORTLEY NO.:  [id number]  MEDICAL RECORD NO.:  [id number]  LOCATION:  1308                         FACILITY:  [facility]  PHYSICIAN:  Currie Paris, M.D.DATE OF BIRTH:  Jul 27, 1960  DATE OF CONSULTATION: DATE OF DISCHARGE:                                CONSULTATION   REASON FOR CONSULTATION:  Question of breast abscess.  HISTORY OF PRESENT ILLNESS:  This patient was admitted on August 27, 2010 with a recurrent MRSA infection.  She presented in April with the locally advanced breast cancer with ulceration.  She has also had known previous history of MRSA infection and the ulcerated area looked contaminated with MRSA.  There was a discrete mass in the left breast. She was biopsied and found to have a grade II Her2 negative, ER positive breast cancer with some adjacent spiculated masses.  Because of the open area and because of the size of tumor, it was elected to treat her with neoadjuvant chemotherapy using Taxotere and FEC.  She had neutropenia after her most recent treatment was returned and at that point was noted to have discrete fluctuant follicular areas under both axilla, right thigh on the lateral aspect.  She was admitted thinking that this was a more disseminated superficial MRSA.  She has now been on vancomycin and followed on by her oncologist with my ID.  Today, she had an ultrasound of the breast and there are multiple tiny fluid collections within the breast, but a large one measuring about 1.2 cm in largest size and apparently with a clip artifact next to it was found and question an abscess.  We were asked to evaluate for this.  Past history, family history, review of systems are known to me from having seen her originally and recently in the office and have already been dictated into other people notes, so I have not re-dictated them here, but I have reviewed them.  PHYSICAL EXAMINATION:  GENERAL:  The patient is alert,  oriented, not at all uncomfortable or any in any sort of distress. SKIN:  She has multiple what looked like healing skin lesions and both axillas, left breast, right breast and on the right leg.  He has also baseline pustules.  She continues have an open area of the left breast, which is about 1.5 cm x 3 3 cm and this is in the lower inner quadrant at the edge of the areola.  She is tender in the 12 o'clock position. There is not really a discrete mass today.  DATA REVIEW:  I looked over the lab studies as well as reviewed the ultrasound.  I have discussed the ultrasound with the interventional radiology, Dr. Richarda Overlie.  He was in the midst of a procedure, so we will review of films subsequently for the potential of aspiration.  IMPRESSION: 1. Methicillin-resistant Staphylococcus aureus infections involving     the skin. 2. Possible abscess at what appears to be the biopsy site and perhaps     from a small hematoma that developed after the biopsy. 3. Stage III breast cancer with open wound.  PLAN:  Again as noted, we will try to aspirate that.  I do not  think it is amenable for an actual drain.  I think once this infection is completely settled down, we need to go ahead with a mastectomy.  I do not believe the open area in the breast is going to heal regardless and she will probably need beyond perioperative and postoperative antibiotics because of her ongoing MRSA issues.  She is obviously at high risk for MRSA wound infection.     Currie Paris, M.D.     CJS/MEDQ  D:  08/31/2010  T:  08/31/2010  Job:  [id number]  Electronically Signed by Cyndia Bent M.D. on 09/03/2010 09:50:58 AM

## 2010-09-04 LAB — BODY FLUID CULTURE
Culture: NO GROWTH
Gram Stain: NONE SEEN

## 2010-09-04 LAB — CBC
HCT: 29.7 % — ABNORMAL LOW (ref 36.0–46.0)
Hemoglobin: 9.5 g/dL — ABNORMAL LOW (ref 12.0–15.0)
RBC: 3.04 MIL/uL — ABNORMAL LOW (ref 3.87–5.11)
WBC: 6.2 10*3/uL (ref 4.0–10.5)

## 2010-09-05 LAB — CBC
HCT: 30.3 % — ABNORMAL LOW (ref 36.0–46.0)
Hemoglobin: 9.6 g/dL — ABNORMAL LOW (ref 12.0–15.0)
MCH: 31.2 pg (ref 26.0–34.0)
MCV: 98.4 fL (ref 78.0–100.0)
Platelets: 454 10*3/uL — ABNORMAL HIGH (ref 150–400)
RBC: 3.08 MIL/uL — ABNORMAL LOW (ref 3.87–5.11)
WBC: 6.8 10*3/uL (ref 4.0–10.5)

## 2010-09-06 LAB — CBC
HCT: 34 % — ABNORMAL LOW (ref 36.0–46.0)
Hemoglobin: 10.6 g/dL — ABNORMAL LOW (ref 12.0–15.0)
MCHC: 31.2 g/dL (ref 30.0–36.0)
MCV: 99.1 fL (ref 78.0–100.0)

## 2010-09-07 ENCOUNTER — Other Ambulatory Visit: Payer: Self-pay | Admitting: Physician Assistant

## 2010-09-07 ENCOUNTER — Encounter (HOSPITAL_BASED_OUTPATIENT_CLINIC_OR_DEPARTMENT_OTHER): Payer: Self-pay | Admitting: Oncology

## 2010-09-07 DIAGNOSIS — C50119 Malignant neoplasm of central portion of unspecified female breast: Secondary | ICD-10-CM

## 2010-09-07 DIAGNOSIS — Z170 Estrogen receptor positive status [ER+]: Secondary | ICD-10-CM

## 2010-09-07 DIAGNOSIS — A4902 Methicillin resistant Staphylococcus aureus infection, unspecified site: Secondary | ICD-10-CM

## 2010-09-07 LAB — CBC WITH DIFFERENTIAL/PLATELET
Basophils Absolute: 0 10*3/uL (ref 0.0–0.1)
EOS%: 0 % (ref 0.0–7.0)
Eosinophils Absolute: 0 10*3/uL (ref 0.0–0.5)
LYMPH%: 14 % (ref 14.0–49.7)
MCH: 32.4 pg (ref 25.1–34.0)
MCV: 97.1 fL (ref 79.5–101.0)
MONO%: 17.3 % — ABNORMAL HIGH (ref 0.0–14.0)
NEUT#: 6.5 10*3/uL (ref 1.5–6.5)
Platelets: 775 10*3/uL — ABNORMAL HIGH (ref 145–400)
RBC: 3.33 10*6/uL — ABNORMAL LOW (ref 3.70–5.45)

## 2010-09-07 LAB — COMPREHENSIVE METABOLIC PANEL
Alkaline Phosphatase: 77 U/L (ref 39–117)
BUN: 9 mg/dL (ref 6–23)
Glucose, Bld: 90 mg/dL (ref 70–99)
Sodium: 137 mEq/L (ref 135–145)
Total Bilirubin: 0.2 mg/dL — ABNORMAL LOW (ref 0.3–1.2)

## 2010-09-14 ENCOUNTER — Encounter (HOSPITAL_BASED_OUTPATIENT_CLINIC_OR_DEPARTMENT_OTHER): Payer: Self-pay | Admitting: Oncology

## 2010-09-14 ENCOUNTER — Other Ambulatory Visit: Payer: Self-pay | Admitting: Physician Assistant

## 2010-09-14 DIAGNOSIS — Z170 Estrogen receptor positive status [ER+]: Secondary | ICD-10-CM

## 2010-09-14 DIAGNOSIS — C50119 Malignant neoplasm of central portion of unspecified female breast: Secondary | ICD-10-CM

## 2010-09-14 DIAGNOSIS — A4902 Methicillin resistant Staphylococcus aureus infection, unspecified site: Secondary | ICD-10-CM

## 2010-09-14 LAB — CBC WITH DIFFERENTIAL/PLATELET
Basophils Absolute: 0 10*3/uL (ref 0.0–0.1)
Eosinophils Absolute: 0 10*3/uL (ref 0.0–0.5)
HGB: 11.6 g/dL (ref 11.6–15.9)
LYMPH%: 16.9 % (ref 14.0–49.7)
MCV: 96.2 fL (ref 79.5–101.0)
MONO%: 17.3 % — ABNORMAL HIGH (ref 0.0–14.0)
NEUT#: 3.7 10*3/uL (ref 1.5–6.5)
NEUT%: 65.1 % (ref 38.4–76.8)
Platelets: 683 10*3/uL — ABNORMAL HIGH (ref 145–400)

## 2010-09-14 LAB — COMPREHENSIVE METABOLIC PANEL
Albumin: 3.8 g/dL (ref 3.5–5.2)
Alkaline Phosphatase: 72 U/L (ref 39–117)
BUN: 6 mg/dL (ref 6–23)
Creatinine, Ser: 0.72 mg/dL (ref 0.50–1.10)
Glucose, Bld: 76 mg/dL (ref 70–99)
Total Bilirubin: 0.5 mg/dL (ref 0.3–1.2)

## 2010-09-20 ENCOUNTER — Encounter (INDEPENDENT_AMBULATORY_CARE_PROVIDER_SITE_OTHER): Payer: Self-pay | Admitting: Surgery

## 2010-09-21 NOTE — Progress Notes (Signed)
NAMEVILDA, ZOLLNER NO.:  [id number]  MEDICAL RECORD NO.:  [id number]  LOCATION:                                 FACILITY:  PHYSICIAN:  Pierce Crane, M.D., F.R.C.P.C.DATE OF BIRTH:  03-29-1960                                PROGRESS NOTE   PROBLEM: Locally advanced breast cancer with recurrent MRSA infection.  HISTORY OF PRESENT ILLNESS: Brittany Blair is otherwise 50 year old woman from [place] who presented to [place] in April 2012 with locally advanced breast cancer involving the left breast.  She had previous history of MRSA infections and the left breast appeared to have had recurrent infections and ulcerations. Ultimately, physical exam showed a discrete mass in her left breast. She sought medical attention for this and had a mammogram and ultrasound on May 15, 2010, and a biopsy ultimately showing grade 3 HER2 negative, ER positive, PR positive breast cancer.  An MRI of her breasts showed 2 adjacent spiculated masses, lower in aspect of the breast, measuring a total of 5.7 cm, large left axillary lymph nodes were seen as well.  The patient has undergone neoadjuvant therapy, which has resulted in reduction of the left breast mass.  There is still an open area in the inferior portion of the breast, but the mass itself is much reduced in size.  She has received Taxotere chemotherapy and most recently has received her last cycle of FEC, which she received on August 17, 2010.  She did receive Neulasta following treatment.  She saw [place] last week, had a fairly marked neutropenia.  She was afebrile and feeling otherwise well.  She returned today for repeat CBC and consideration of transfusion.  She did point out a number of areas of discrete fluctuant follicular areas under both axilla and her right thigh on the lateral aspect.  One of these areas was aspirated and sent for culture.  She has been having tenderness in both axilla.  She has not had a fever, but feels  unwell, feels fatigued and tired, is unable to take care of her as well she would like.  For this reason, she was brought in to the hospital for further management.  PAST MEDICAL HISTORY: She has a history of hypertension since her 41s, she is not taking medication for this.  She has a history of previous herpetic exposures as well as anxiety.  PAST SURGICAL HISTORY: Tubal ligation.  ALLERGIES: Pollen.  HABITS: She recently re-started smoking after quitting 5 years ago.  She has about 2-4 glasses a day.  MEDICATIONS: As listed in the inpatient record.  FAMILY HISTORY: One sister and one brother alive and well.  One brother died of suicide. She has 4 children, ages 2, 15, 55, and 64.  This is her 3rd marriage to [place].  There is no other family history of breast cancer.  REVIEW OF SYSTEMS: Otherwise negative.  PHYSICAL EXAMINATION: GENERAL:  Today, pleasant woman, looking somewhat pale. VITAL SIGNS:  Blood pressure is 128/87, temperature 98.6, pulse is 130, respiratory rate 20, weight is 142.6. HEENT:  She has no palpable adenopathy in head and neck area. Oropharynx normal.  Head and neck exam is unremarkable. LUNGS:  Clear. CARDIAC:  Heart sounds are normal. BREASTS:  Under the right arm, there are 2 vesicular-appearing lesions as well as 1 on the left side.  Her right breast is normal.  Left breast has an open area measuring about 3-4 cm.  There is some clear drainage, but the mass effect is much decreased in size.  Left axilla is negative. ABDOMEN:  Soft.  There is no palpable hepatosplenomegaly, no peripheral edema.  CBC today, white count is 0.7, ANC is 600.  Hemoglobin 7.6, platelet count of 67,000.  IMPRESSION AND PLAN: Locally advanced breast cancer, history of methicillin-resistant Staphylococcus aureus infection now with history of marked neutropenia, marked anemia, and vesicular-like lesions, which may represent a cutaneous methicillin-resistant  Staphylococcus aureus infection versus a herpetic outbreak, but she has been on acyclovir prophylactically.  We will admitting her to hospital, starting her on vancomycin, we will have ID see her as well.  We will follow up on the results of the aspiration of the right axillary area.  She is a full code.     Pierce Crane, M.D., F.R.C.P.C.     PR/MEDQ  D:  08/27/2010  T:  08/28/2010  Job:  [id number]  cc:   Baruch Merl, M.D. Fax: [phone]  Electronically Signed by Pierce Crane MD on 09/21/2010 08:47:52 AM

## 2010-09-21 NOTE — Discharge Summary (Signed)
Brittany Blair, Brittany Blair NO.:  [id number]  MEDICAL RECORD NO.:  [id number]  LOCATION:  1308                         FACILITY:  [facility]  PHYSICIAN:  Brittany Blair, M.D., F.R.C.P.C.DATE OF BIRTH:  08/27/1960  DATE OF ADMISSION:  08/27/2010 DATE OF DISCHARGE:  09/06/2010                              DISCHARGE SUMMARY   DISCHARGE DIAGNOSES: 1. History of stage III breast cancer, locally advanced, status post     FEC.  Chemo is currently on hold until further instructions. 2. Methicillin-resistant Staphylococcus aureus skin infection,     discharge after vancomycin day #9.  The patient is to be discharged     on Bactrim DS for 10 days. 3. Questionable bronchitis, the patient is to be discharged on Bactrim     DS after the use of 2 days of Zithromax. 4. Pain, controlled with present medications. 5. Anxiety. 6. Hypertension. 7. Generalized fatigue, improved with the help of mobilization. 8. Venous thromboembolism prophylaxis. 9. Full code.  CONDITION ON DISCHARGE:  Improved.  LABORATORY DATA ON DISCHARGE:  White count 8.0, hemoglobin 10.6, hematocrit 34, platelets 587, MCV 99.1, ANC 4.5 for a white count of 6.5 on September 03, 2010. Lymphocytes 1.2, monocytes 0.7.  Sodium 140, potassium 3.7, BUN 6, creatinine 0.67, glucose 86.  Total bilirubin 0.2, alkaline phosphatase 79, AST 14, ALT 11, total protein 5.1, albumin 2.7, calcium 8.4.  Bloody fluid culture of the left breast is negative.  RADIOLOGICAL STUDIES: 1. Status post chest x-ray, September 03, 2010, with no definite pneumonia.     Linear atelectasis developed at the left base. 2. Status post chest ultrasound on August 31, 2010, performed for the     evaluation of abscess, not breast pathology.  Scattered soft tissue     edema with small pockets of fluid in the left breast. 3. Status post ultrasound-guided breast fluid collection aspiration on     August 31, 2010, with no sample started for pathology. 4. Status post chest  x-ray on August 29, 2010, no active lung disease.  MEDICATIONS ON DISCHARGE: 1. Bactrim DS 2 tablets by mouth twice daily for 10 days. 2. HCTZ 25 mg p.o. daily. 3. Acyclovir 400 mg 1 p.o. b.i.d. 4. Advil 200 mg p.o. q.8 h. p.r.n. chemotherapy as directed by Dr.     Donnie Blair. 5. Claritin 10 mg p.o. daily. 6. Prozac 20 mg p.o. q.a.m. 7. Vasotec 5 mg 1 p.o. daily. 8. Vicodin (hydrocodone with APAP 5-500) 1-2 tablets p.o. q.6-8 h.     p.r.n. for pain. 9. Xanax 1 mg tablet, half to one tablet p.o. q.6-8 h. p.r.n. for     anxiety. 10.A prescription for Bactrim DS, HCTZ 25 mg, and Vicodin 5/500 has     been provided to the patient.    HISTORY OF PRESENT ILLNESS:  Brittany Blair is a very pleasant 50 year old white female from [place], [place], who initially presented to Dr. Renelda Blair office in June 07, 2010, with locally advanced breast cancer, involving the left breast.  The patient has a prior history of MRSA infection and the left breast appeared to have recurrent infections and ulcerations.  Ultimately, a physical exam demonstrated a discrete mass on her left  breast.  She sought medical attention for this and had a mammogram with ultrasound on May 15, 2010, and a biopsy ultimately demonstrated grade 3, HER2 negative, ER positive, PR positive breast cancer.  MRI of her breast demonstrated 2 adjacent spiculated masses, in the lower aspect of the breast, measuring 5.7 cm, large left axillary lymph nodes were seen as well.  The patient has undergone neoadjuvant chemotherapy, which has resulted in reduction of the left breast mass. There is still an open area in the inferior portion of the left breast, but the mass itself has much reduced in size.  She received Taxotere chemotherapy and most recently has received the last cycle of FEC, which she received on August 17, 2010.  The patient also received Neulasta following the treatment.  She saw Dr. Donnie Blair a week prior to admission, with  fairly marked neutropenia.  She was afebrile and feeling otherwise well.  She returned on August 27, 2010, to the office of the cancer center for a repeat CBC and consideration of transfusion.  She did point out a number of areas of discrete, fluctuance, follicular areas under both axilla and her right thigh of the lateral aspect.  One of these area was aspirated and sent for culture.  She was having tenderness in both axillary areas.  She did not have any fever, but felt unwell, fatigue, and tired, unable to take care of her as well as she would at night. Thus, she was brought in to the hospital for further management.  HOSPITAL COURSE:  On admission, in the setting of a locally advanced breast cancer, with a history of MRSA Staphylococcus aureus infection, with additionally marked neutropenia and marked anemia with a white count of 0.7 and an ANC of 600 and hemoglobin of 76 and platelets of 67,000 as well as vesicular like lesion, the patient after admission to the hospital initiated on vancomycin.  While waiting for the results of the aspiration, Infectious Disease was requested to see the patient, for further evaluation.  Brittany Blair on August 31, 2010, noticed multiple healing skin lesions, in both axillas, left breast, and right breast and on the right leg.  He also noted baseline pustules.  She continued to have open areas in the left breast, about 1.5 x 3.3 cm, in the lower inner quadrant at the edge of the areola.  This area was tender.  There was not really a discrete mass at the time of evaluation, however.  An ultrasound had been performed by Interventional Radiology and no pathology was sent.  However, the cultures returned negative for organisms.  The rest of her hospitalization was geared towards the treatment of these areas, proper wound care, and vancomycin as directed by Infectious Disease and Pharmacy.  Eventually, her skin lesions began to improve.  She was still debilitated,  complaining of generalized pain, with the help of the proper dose of Vicodin, this was sufficient to stabilize the symptoms.  She also was very weak and her counts did show to be low.  Her hemoglobin and hematocrit as of July 3rd had been 9 and 25.9, dropping to 8.5 and 25.1 on July 4th for which she continued to have these counts monitored, after administration of 1 unit of packed RBCs.  At the time of discharge, her counts were low, but improved since admission at the value of 10.6 and 34 respectively.  Her white count, as mentioned before, on admission had been significantly low for which she was taking Neulasta for support.  During the rest of the hospitalization, these white count showed improvement.  As of July 8th, her white count was 7.9, with an ANC of 5.2.  Thus, her neutropenia had resolved as well.  As of September 06, 2010, her vital signs were stable, she was afebrile, and her counts as mentioned before were normalized.  Her cough, which per chest x-ray showed no acute disease, had been treated with antitussives and Zithromax as well.  On July 12th, her Zithromax prior to discharge is to be discontinued and as recommended by Infectious Disease after discontinuation of vancomycin as well for the treatment of the skin lesions, the patient is to be discharged on Bactrim DS 2 p.o. b.i.d. for 10 days in order to protect her from skin abscess development as well as respiratory disease.  The patient is to follow up at the office of the cancer center on September 07, 2010, but she knows to call the office of the cancer center prior to that, if she has any questions or concerns.  In addition, as for her chemo plans, this will be discussed once her overall status and her skin lesions heal.  Dr. Donnie Blair will decide on the route to take regarding chemotherapy treatment. Plan has been discussed with Dr Drue Second for Dr. Donnie Blair who is out of office,  prior to discharge, and agreed.     Marlowe Kays, PA-C   ______________________________ Brittany Blair, M.D., F.R.C.P.C.    SW/MEDQ  D:  09/09/2010  T:  09/10/2010  Job:  [id number]  Electronically Signed by Marlowe Kays P.A. on 09/10/2010 08:43:25 AM Electronically Signed by Brittany Crane MD on 09/21/2010 08:47:46 AM

## 2010-09-28 ENCOUNTER — Ambulatory Visit (INDEPENDENT_AMBULATORY_CARE_PROVIDER_SITE_OTHER): Payer: Self-pay | Admitting: Surgery

## 2010-09-28 ENCOUNTER — Encounter (INDEPENDENT_AMBULATORY_CARE_PROVIDER_SITE_OTHER): Payer: Self-pay | Admitting: Surgery

## 2010-09-28 VITALS — BP 170/116 | HR 100 | Temp 97.6°F

## 2010-09-28 DIAGNOSIS — C50319 Malignant neoplasm of lower-inner quadrant of unspecified female breast: Secondary | ICD-10-CM

## 2010-10-01 ENCOUNTER — Encounter (INDEPENDENT_AMBULATORY_CARE_PROVIDER_SITE_OTHER): Payer: Self-pay | Admitting: Surgery

## 2010-10-01 NOTE — Progress Notes (Signed)
Brittany Blair is a 50 y.o. female.    Chief Complaint  Patient presents with  . Breast Cancer Long Term Follow Up    HPI HPI This patient presents today to talk about proceeding with a modified radical mastectomy for her left-sided breast cancer. She has been treated with neoadjuvant chemotherapy. When she initially presented she had an ulcerated lesion on the lower inner quadrant of the left breast. She noticed this has now resolved. She also was recently hospitalized with multiple superficial MRSA infections. These have now resolved.  Past Medical History  Diagnosis Date  . Anxiety   . Herpes   . MRSA (methicillin resistant Staphylococcus aureus)   . Breast lump     left  . Cancer     breast  . Hypertension     Past Surgical History  Procedure Date  . Tubal ligation   . Wisdom tooth extraction     Family History  Problem Relation Age of Onset  . Heart disease Father     heart attack    Social History History  Substance Use Topics  . Smoking status: Current Everyday Smoker -- 0.5 packs/day  . Smokeless tobacco: Not on file  . Alcohol Use: 12.6 oz/week    21 Glasses of wine per week    Allergies  Allergen Reactions  . Bee     Current Outpatient Prescriptions  Medication Sig Dispense Refill  . acyclovir (ZOVIRAX) 400 MG tablet Take 400 mg by mouth 2 (two) times daily.        Marland Kitchen ALPRAZolam (XANAX) 0.5 MG tablet Take 0.5 mg by mouth at bedtime as needed.        . enalapril (VASOTEC) 10 MG tablet Take 10 mg by mouth daily.        Marland Kitchen FLUoxetine (PROZAC) 20 MG tablet Take 40 mg by mouth daily.        Marland Kitchen HYDROcodone-acetaminophen (VICODIN) 5-500 MG per tablet Take 1 tablet by mouth every 6 (six) hours as needed.        . loratadine-pseudoephedrine (CLARITIN-D 24-HOUR) 10-240 MG per 24 hr tablet Take 1 tablet by mouth daily.        . hydrochlorothiazide 25 MG tablet Take 25 mg by mouth daily.          Review of Systems ROS  Physical Exam Physical Exam GENERAL:  The patient is alert, oriented, and generally healthy-appearing, NAD. Mood and affect are normal.  HEENT: The head is normocephalic, the eyes nonicteric, the pupils were round regular and equal. EOMs are normal. Pharynx normal. Dentition good.  NECK: The neck is supple and there are no masses or thyromegaly.  LUNGS: Normal respirations and clear to auscultation.  HEART: Regular rhythm, with no murmurs rubs or gallops. Pulses are intact carotid dorsalis pedis and posterior tibial. No significant varicosities are noted.  BREASTS: The right breast is normal. There remains a mass in the left breast centrally located extending into the lower inner quadrant. The ulcerated area now has almost completely healed over with only a little bit of a "scab ". There is no axillary or cervical vitreoretinopathy noted.  ABDOMEN: Soft, flat, and nontender. No masses or organomegaly is noted. No hernias are noted. Bowel sounds are normal.  EXTREMITIES: Good range of motion, no edema.  Blood pressure 170/116, pulse 100, temperature 97.6 F (36.4 C).  Data reviewed: I have reviewed all of our old notes, the nose from the alkaline phosphatase, and a recent hospitalization.  Assessment/Plan Impression: Stage III  left breast cancer status post neoadjuvant chemotherapy. MR SA infections.  Plan: We will proceed to a modified left radical mastectomy. I will make range was to have that scheduled. I've gone over the risks complications et Karie Soda. She understands that she will have drains and spend a day or two in the hospital. We will plan to treat her with some vancomycin perioperatively.  Tawonda Legaspi J 10/01/2010, 8:29 AM

## 2010-10-02 ENCOUNTER — Other Ambulatory Visit (INDEPENDENT_AMBULATORY_CARE_PROVIDER_SITE_OTHER): Payer: Self-pay | Admitting: Surgery

## 2010-10-02 ENCOUNTER — Telehealth (INDEPENDENT_AMBULATORY_CARE_PROVIDER_SITE_OTHER): Payer: Self-pay | Admitting: General Surgery

## 2010-10-02 ENCOUNTER — Ambulatory Visit (HOSPITAL_COMMUNITY)
Admission: RE | Admit: 2010-10-02 | Discharge: 2010-10-02 | Disposition: A | Discharge status: Home / Self Care | Payer: Self-pay | Source: Ambulatory Visit | Attending: Surgery | Admitting: Surgery

## 2010-10-02 ENCOUNTER — Encounter (HOSPITAL_COMMUNITY): Payer: Self-pay

## 2010-10-02 DIAGNOSIS — Z01818 Encounter for other preprocedural examination: Secondary | ICD-10-CM | POA: Insufficient documentation

## 2010-10-02 DIAGNOSIS — Z01812 Encounter for preprocedural laboratory examination: Secondary | ICD-10-CM | POA: Insufficient documentation

## 2010-10-02 DIAGNOSIS — Z01810 Encounter for preprocedural cardiovascular examination: Secondary | ICD-10-CM | POA: Insufficient documentation

## 2010-10-02 DIAGNOSIS — C50919 Malignant neoplasm of unspecified site of unspecified female breast: Secondary | ICD-10-CM

## 2010-10-02 LAB — URINALYSIS, ROUTINE W REFLEX MICROSCOPIC
Bilirubin Urine: NEGATIVE
Ketones, ur: NEGATIVE mg/dL
Nitrite: NEGATIVE
Protein, ur: NEGATIVE mg/dL
Urobilinogen, UA: 0.2 mg/dL (ref 0.0–1.0)

## 2010-10-02 LAB — COMPREHENSIVE METABOLIC PANEL
AST: 80 U/L — ABNORMAL HIGH (ref 0–37)
Albumin: 3.7 g/dL (ref 3.5–5.2)
Alkaline Phosphatase: 107 U/L (ref 39–117)
BUN: 5 mg/dL — ABNORMAL LOW (ref 6–23)
Chloride: 104 mEq/L (ref 96–112)
Potassium: 4.2 mEq/L (ref 3.5–5.1)
Total Bilirubin: 0.2 mg/dL — ABNORMAL LOW (ref 0.3–1.2)

## 2010-10-02 LAB — CBC
MCH: 34.3 pg — ABNORMAL HIGH (ref 26.0–34.0)
MCHC: 31.7 g/dL (ref 30.0–36.0)
MCV: 108.4 fL — ABNORMAL HIGH (ref 78.0–100.0)
Platelets: 176 10*3/uL (ref 150–400)
RDW: 24.1 % — ABNORMAL HIGH (ref 11.5–15.5)

## 2010-10-02 LAB — DIFFERENTIAL
Basophils Absolute: 0.1 10*3/uL (ref 0.0–0.1)
Eosinophils Relative: 11 % — ABNORMAL HIGH (ref 0–5)
Lymphocytes Relative: 35 % (ref 12–46)
Monocytes Relative: 12 % (ref 3–12)
Neutro Abs: 1.1 10*3/uL — ABNORMAL LOW (ref 1.7–7.7)

## 2010-10-02 LAB — URINE MICROSCOPIC-ADD ON

## 2010-10-02 LAB — SURGICAL PCR SCREEN: MRSA, PCR: NEGATIVE

## 2010-10-02 NOTE — Telephone Encounter (Signed)
Patient was at Pre-op at [facility] and spoke to Pella. She has a history of MRSA infection. Patient's grandson stepped on her toe this weekend and she has a small area on left fifth toe that she is worried about. Per nurse it looks like a blood blister. I spoke with Dr Jamey Ripa who advised this is okay, to put antibiotic ointment on the area. Patient is getting vancomycin prior to surgery. I left message for patient to call back for this information.

## 2010-10-02 NOTE — Telephone Encounter (Signed)
Pt told ok surgery  Per dr streck//[id number] Matthias Hughs

## 2010-10-02 NOTE — Telephone Encounter (Signed)
Talk with pt and she will start taking bactrim as well and using neomycin on her toe/ she should be ok for surgery per dr Jamey Ripa . Pt will call if toe worsens on antibiotic//[id number] eh

## 2010-10-02 NOTE — Telephone Encounter (Signed)
Pre op called and given ok for surgery per dr Jamey Ripa after reviewing labs by him// called [phone]/[id number] eh

## 2010-10-10 ENCOUNTER — Inpatient Hospital Stay (HOSPITAL_COMMUNITY)
Admission: RE | Admit: 2010-10-10 | Discharge: 2010-10-12 | DRG: 583 | Disposition: A | Discharge status: Home / Self Care | Payer: Self-pay | Source: Ambulatory Visit | Attending: Surgery | Admitting: Surgery

## 2010-10-10 ENCOUNTER — Other Ambulatory Visit (INDEPENDENT_AMBULATORY_CARE_PROVIDER_SITE_OTHER): Payer: Self-pay | Admitting: Surgery

## 2010-10-10 DIAGNOSIS — F411 Generalized anxiety disorder: Secondary | ICD-10-CM | POA: Diagnosis present

## 2010-10-10 DIAGNOSIS — C50319 Malignant neoplasm of lower-inner quadrant of unspecified female breast: Principal | ICD-10-CM | POA: Diagnosis present

## 2010-10-10 DIAGNOSIS — Z9221 Personal history of antineoplastic chemotherapy: Secondary | ICD-10-CM

## 2010-10-10 DIAGNOSIS — I10 Essential (primary) hypertension: Secondary | ICD-10-CM | POA: Diagnosis present

## 2010-10-10 DIAGNOSIS — C50919 Malignant neoplasm of unspecified site of unspecified female breast: Secondary | ICD-10-CM

## 2010-10-10 DIAGNOSIS — F17200 Nicotine dependence, unspecified, uncomplicated: Secondary | ICD-10-CM | POA: Diagnosis present

## 2010-10-10 DIAGNOSIS — Z8614 Personal history of Methicillin resistant Staphylococcus aureus infection: Secondary | ICD-10-CM

## 2010-10-10 DIAGNOSIS — Z01812 Encounter for preprocedural laboratory examination: Secondary | ICD-10-CM

## 2010-10-10 DIAGNOSIS — Z01810 Encounter for preprocedural cardiovascular examination: Secondary | ICD-10-CM

## 2010-10-10 DIAGNOSIS — Z01818 Encounter for other preprocedural examination: Secondary | ICD-10-CM

## 2010-10-10 HISTORY — PX: MASTECTOMY MODIFIED RADICAL: SUR848

## 2010-10-11 NOTE — Op Note (Signed)
Brittany Blair, Brittany Blair              ACCOUNT NO.:  [id number]  MEDICAL RECORD NO.:  [id number]  LOCATION:  1320                         FACILITY:  [facility]  PHYSICIAN:  Currie Paris, M.D.DATE OF BIRTH:  22-Sep-1960  DATE OF PROCEDURE:  10/10/2010 DATE OF DISCHARGE:                              OPERATIVE REPORT   PREOPERATIVE DIAGNOSIS:  Carcinoma, left breast, lower inner quadrant, status post neoadjuvant chemotherapy.  POSTOPERATIVE DIAGNOSIS:  Carcinoma, left breast, lower inner quadrant, status post neoadjuvant chemotherapy.  PROCEDURE:  Left modified radical mastectomy.  SURGEON:  Currie Paris, M.D.  ASSISTANT:  Ardeth Sportsman, M.D.  ANESTHESIA:  General.  CLINICAL HISTORY:  This is a 50 year old lady who presented several months ago with a fungating exophytic mass in the proximally 7.30 to 8 o'clock position around the left areola.  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  She also was growing MRSA on the surface of the fungating mass.  The patient was treated antibiotics plus chemotherapy and had shrinkage of the mass and eventually healing, although she was hospitalized about a month ago with multiple superficial staph or abscesses of the skin. When she was seen last week to schedule surgery, all of these had healed and in fact the open wound had healed over and closed.  DESCRIPTION OF PROCEDURE:  I saw the patient in the holding area and we confirmed the plans as noted above.  The left breast was marked as the operative side.  The patient was taken to the operating room and after satisfactory general anesthesia had been obtained, the left breast was prepped and draped and the time-out was done.  I made an elliptical incision taking a wide incision medially to go around the mass, although the inferior margin was almost at the inframammary fold.  I raised skin flaps in the usual fashion medially  to the sternum, superiorly to the clavicle, inferiorly to just beyond the inframammary fold, and laterally to the latissimus.  The breast was removed taking the fascia from medial to lateral using cautery.  The clavipectoral fascia was opened and the axillary fatty pad identified.  Blunt dissection was able to identify the inferior aspect of the axillary vein.  The axillary contents were then swept out from superior to inferior medial to lateral in the usual fashion.  I identified and preserved the long thoracic and thoracodorsal nerves. Second intercostal nerve was divided.  The blood supply to the pectoralis was preserved.  The final lateral attachments towards the axillary skin into the anterior edge of the latissimus were divided and the specimen handed off.  I irrigated and made sure everything was dry.  I carefully palpated into the axilla to make sure we did not have any residual tumor or other tissue that needed to be removed.  I pinched both long thoracic and thoracodorsal nerves.  They appeared to work fine.  Two 19 Blake drains were placed and secured with 2-0 nylons.  Another irrigation and check for hemostasis was made and everything appeared dry.  Incision was then closed with interrupted 3-0 Vicryl, running 4-0 Monocryl subcuticular, Dermabond and Steri-Strips.  The patient tolerated the  procedure well and there were no complications.  All counts were correct.  Estimated blood loss was 100 mL.     Currie Paris, M.D.     CJS/MEDQ  D:  10/10/2010  T:  10/11/2010  Job:  [id number]  Electronically Signed by Cyndia Bent M.D. on 10/11/2010 07:58:37 AM

## 2010-10-15 ENCOUNTER — Telehealth (INDEPENDENT_AMBULATORY_CARE_PROVIDER_SITE_OTHER): Payer: Self-pay | Admitting: Surgery

## 2010-10-15 ENCOUNTER — Telehealth (INDEPENDENT_AMBULATORY_CARE_PROVIDER_SITE_OTHER): Payer: Self-pay | Admitting: General Surgery

## 2010-10-15 NOTE — Telephone Encounter (Signed)
Message copied by Liliana Cline on Mon Oct 15, 2010  4:27 PM ------      Message from: Currie Paris      Created: Mon Oct 15, 2010  2:20 PM       Let her know there were no unexpected findings - she knows she has cancer with nodes involved. I will go over the report in detail when she comes into the office

## 2010-10-15 NOTE — Telephone Encounter (Signed)
Trying to contact her

## 2010-10-15 NOTE — Telephone Encounter (Signed)
Patient made aware that results were what we expected. She did have node involvement and Dr Jamey Ripa would go over that with her at her follow up appt. Patient is okay with this plan. She is worried about a post op hematoma. She states she has been putting ice on it and it hasn't gotten any smaller. I made appt for Wednesday for her to have this checked by Dr Jamey Ripa. Patient is to call back tomorrow if this gets worse.

## 2010-10-15 NOTE — Telephone Encounter (Signed)
No answer - tried to call about pathology

## 2010-10-17 ENCOUNTER — Ambulatory Visit (INDEPENDENT_AMBULATORY_CARE_PROVIDER_SITE_OTHER): Payer: Self-pay | Admitting: Surgery

## 2010-10-17 ENCOUNTER — Encounter (INDEPENDENT_AMBULATORY_CARE_PROVIDER_SITE_OTHER): Payer: Self-pay | Admitting: Surgery

## 2010-10-17 VITALS — BP 136/108 | HR 100

## 2010-10-17 DIAGNOSIS — C50319 Malignant neoplasm of lower-inner quadrant of unspecified female breast: Secondary | ICD-10-CM

## 2010-10-17 MED ORDER — HYDROMORPHONE HCL 2 MG PO TABS: 2.0000 mg | ORAL_TABLET | ORAL | Status: DC | PRN

## 2010-10-17 NOTE — Patient Instructions (Signed)
Drain needs to be less than 30 cc in 24 hours to be removed. Call if any problems

## 2010-10-17 NOTE — Progress Notes (Signed)
CC: Left modified radical mastectomy  HPI: This patient comes in for post op follow-up. She underwent left modified radical mastectomy on 10/10/10. She feels that she is doing ok but is concerned because of the bruising in the axillary area which has now extended down her left arm somewhat. She notes that the medial drain has been draining less and 20 cc per day although the lateral drain continues to drain more than 60 cc per day. She is about out of her pain medication, Dilaudid PE: General: The patient appears to be healthy, NAD  The mastectomy incision is clean and healthy. The medial drain is fairly clear. The lateral drain has a darker fluid in it. There is an ecchymosis at the anterior end of the axilla extending over the edge of the pectoralis with some ecchymosis extending down the medial left arm and a little bit extending across the chest midline into the right side. It is soft it is not feel infected and I think represents a small hematoma.  IMPRESSION: The patient is doing well S/P mastectomy, with the exception of what appears to be a small hematoma.Marland Kitchen  DATA REVIWED: I had already discussed her pathology report but I reviewed it again with her today.  PLAN: The medial drain was removed today. I will see her back next week and hopefully get the other drain out.

## 2010-10-19 ENCOUNTER — Encounter (INDEPENDENT_AMBULATORY_CARE_PROVIDER_SITE_OTHER): Payer: Self-pay | Admitting: General Surgery

## 2010-10-19 ENCOUNTER — Encounter (INDEPENDENT_AMBULATORY_CARE_PROVIDER_SITE_OTHER): Payer: Self-pay | Admitting: Surgery

## 2010-10-19 ENCOUNTER — Encounter (INDEPENDENT_AMBULATORY_CARE_PROVIDER_SITE_OTHER): Payer: Self-pay

## 2010-10-19 NOTE — Progress Notes (Signed)
Subjective:     Patient ID: Brittany Blair, female   DOB: Oct 22, 1960, 50 y.o.   MRN: [id number]  HPI This is Dr. Tenna Child patient who underwent left modified radical mastectomy on October 10, 2010. She was last seen by Dr. Jamey Ripa on October 17, 2010.  She came in today because there were some new redness and blistering of the upper skin flap. She has a history of MRSA and was concerned. She is taking Bactrim prophylactically. She also has bruising of the mastectomy skin flap, the axilla, and the left arm, but that is not new. She has not had any fever or chills.  Review of Systems     Objective:   Physical Exam Very pleasant, but very anxious and somewhat fearful patient. Her husband is with her. Temperature 97.0. Pulse 100. Blood pressure 160/94.  The upper flap of the mastectomy has some serpiginous erythema and overlying blister. I removed the Steri-Strips in this area and the blister opened up and so I debrided the blister. There is no underlying hematoma or necrosis. This looks like an ischemic change. This was dressed with Neosporin and 4 x 4's. There is a small hematoma in the left axilla that is a little bit tender but that does not look infected. There is some ecchymoses on the left arm but no signs of any cellulitis.   Assessment:     Suspect localized ischemic change to skin and superficial dermis of superior mastectomy flap. No evidence of necrosis.  It is possible, but less likely that this is an emerging MRSA infection.  Status post left modified radical mastectomy October 10, 2010.  Small hematoma left axilla.  Past history hospitalization for MRSA infection of the lower extremity.    Plan:     Continue Bactrim.  Upon Neosporin ointment to superior mastectomy skin flap daily and cover with dry 4 x 4's.  Keep her appointment with Dr. Jamey Ripa one week from today.  Call if the redness in the superior skin flap gets larger, or the skin turned very dark. Call if there is any  fever or chills.

## 2010-10-19 NOTE — Patient Instructions (Signed)
The blister on your mastectomy skin flap looks much more like a change in the circulation to the skin, and much less like MRSA. I removed the blister today. Continue taking the Bactrim. Apply Neosporin ointment to the skin daily. Keep your appointment with Dr. Jamey Ripa next Friday. Call if the area of redness begins to spread or if you develop fever.

## 2010-10-22 ENCOUNTER — Telehealth (INDEPENDENT_AMBULATORY_CARE_PROVIDER_SITE_OTHER): Payer: Self-pay | Admitting: General Surgery

## 2010-10-22 ENCOUNTER — Telehealth (INDEPENDENT_AMBULATORY_CARE_PROVIDER_SITE_OTHER): Payer: Self-pay

## 2010-10-22 NOTE — Telephone Encounter (Signed)
Patient called on Friday 10/19/10 at 3:00pm and was sent to my voicemail. Patient's husband stated that above where we removed her drain the area was red and hard. I called patient back this morning and apologized for her being put in my voicemail and explaining I left early on Friday and the office was informed not to put patients into my voicemail. Patient had actually called back and spoke with triage where she was brought into the office and seen by Dr Derrell Lolling. Patient is ok now and will keep her follow up on Friday 10/26/10.

## 2010-10-22 NOTE — Telephone Encounter (Signed)
Pt called requesting more Hydromorphone.  Dr Jamey Ripa wrote for it but she refused to pick it up because of her long distance drive.  Dr Jamey Ripa gave the ok for Hydrocodone protocol.  I called in Hydrocodone 5-325 one tab po q4-6prn pain #30 no refills,generic allowed.  Pt aware

## 2010-10-26 ENCOUNTER — Ambulatory Visit (INDEPENDENT_AMBULATORY_CARE_PROVIDER_SITE_OTHER): Payer: Self-pay | Admitting: Surgery

## 2010-10-26 ENCOUNTER — Encounter (INDEPENDENT_AMBULATORY_CARE_PROVIDER_SITE_OTHER): Payer: Self-pay | Admitting: Surgery

## 2010-10-26 VITALS — BP 136/100 | HR 100

## 2010-10-26 DIAGNOSIS — Z9889 Other specified postprocedural states: Secondary | ICD-10-CM

## 2010-10-26 MED ORDER — HYDROMORPHONE HCL 2 MG PO TABS: 2.0000 mg | ORAL_TABLET | ORAL | Status: DC | PRN

## 2010-10-26 MED ORDER — SULFAMETHOXAZOLE-TMP DS 800-160 MG PO TABS: 1.0000 | ORAL_TABLET | Freq: Two times a day (BID) | ORAL | Status: DC

## 2010-10-26 NOTE — Patient Instructions (Signed)
Ok to shower but redress the drain site with an antibiotic ointment and a sterile gauze dressing.

## 2010-10-26 NOTE — Progress Notes (Signed)
CC: Left modified radical mastectomy  HPI: This patient comes in for post op follow-up. She underwent left modified radical mastectomy on 10/10/10.She was then last week and saw Dr. Derrell Lolling because she had what sounds like some epidural lysis of the superior flap. This debridement seems to be doing well. Having a little bit less discomfort but still having pain where the hematoma was towards the axilla. She notices a drainage is now much more clear and less red. She also notices that the area of the hematoma has gotten smaller.She is still draining about 60cc/day  PE: General: The patient appears to be healthy, NAD  The mastectomy incision is clean and healthy. The lateral drain is now serous and the area of the hematoma is smaller and the eccymosis has resolved. IMPRESSION: The patient is doing well S/P mastectomy.  DATA REVIWED:   PLAN: She can shower now, but the drain will need to stay in. Will see her again next week.

## 2010-11-01 ENCOUNTER — Encounter (INDEPENDENT_AMBULATORY_CARE_PROVIDER_SITE_OTHER): Payer: Self-pay | Admitting: Surgery

## 2010-11-02 ENCOUNTER — Ambulatory Visit (INDEPENDENT_AMBULATORY_CARE_PROVIDER_SITE_OTHER): Payer: Self-pay | Admitting: Surgery

## 2010-11-02 ENCOUNTER — Encounter (INDEPENDENT_AMBULATORY_CARE_PROVIDER_SITE_OTHER): Payer: Self-pay | Admitting: Surgery

## 2010-11-02 VITALS — BP 154/102 | HR 84

## 2010-11-02 DIAGNOSIS — Z9889 Other specified postprocedural states: Secondary | ICD-10-CM

## 2010-11-02 NOTE — Progress Notes (Signed)
NAME: Brittany Blair                                            DOB: 16-Jun-1960 DATE: 11/02/2010                                                  MRN: [id number]  CC: Post op left mastectomy  HPI: This patient comes in for post op follow-up.Sheunderwent Left modified mastectomy on 09/28/10. She feels that she is doing well.Her final drain has slowed  PE: General: The patient appears to be healthy, NAD  DATA REVIEWED: No new data  IMPRESSION: The patient is doing well S/P mastectomy - drain ready to remove.    PLAN: Will see in one week. Gave info on ABC class

## 2010-11-02 NOTE — Patient Instructions (Signed)
I need to see you in one week to followup to her incision. Please try to make an appointment for the ABC class where they can give you some instructions on lymphedema prevention and some arm exercises for your left shoulder.

## 2010-11-06 ENCOUNTER — Ambulatory Visit (INDEPENDENT_AMBULATORY_CARE_PROVIDER_SITE_OTHER): Payer: Self-pay | Admitting: Surgery

## 2010-11-06 VITALS — BP 144/90 | HR 80

## 2010-11-06 DIAGNOSIS — Z9889 Other specified postprocedural states: Secondary | ICD-10-CM

## 2010-11-06 MED ORDER — HYDROCODONE-ACETAMINOPHEN 5-325 MG PO TABS: 1.0000 | ORAL_TABLET | Freq: Four times a day (QID) | ORAL | Status: DC | PRN

## 2010-11-06 NOTE — Progress Notes (Signed)
NAME: Brittany Blair                                            DOB: [birth year] DATE: 11/06/2010                                                  MRN: [id number]  CC: Post op left mastectomy  HPI: This patient comes in for post op follow-up.Sheunderwent Left modified mastectomy on 09/28/10. She feels that she is doing well. PE: General: The patient appears to be healthy, NAD Breast: There is a small fluid collection  DATA REVIEWED: No new data  IMPRESSION: The patient is doing well S/P mastectomy -small seroma   PLAN: Aspirated 17 cc. Clear fluid Will see in one week.

## 2010-11-08 ENCOUNTER — Encounter (HOSPITAL_BASED_OUTPATIENT_CLINIC_OR_DEPARTMENT_OTHER): Payer: Self-pay | Admitting: Oncology

## 2010-11-08 ENCOUNTER — Other Ambulatory Visit: Payer: Self-pay | Admitting: Physician Assistant

## 2010-11-08 DIAGNOSIS — Z170 Estrogen receptor positive status [ER+]: Secondary | ICD-10-CM

## 2010-11-08 DIAGNOSIS — Z5111 Encounter for antineoplastic chemotherapy: Secondary | ICD-10-CM

## 2010-11-08 DIAGNOSIS — C50119 Malignant neoplasm of central portion of unspecified female breast: Secondary | ICD-10-CM

## 2010-11-08 DIAGNOSIS — D709 Neutropenia, unspecified: Secondary | ICD-10-CM

## 2010-11-08 DIAGNOSIS — C380 Malignant neoplasm of heart: Secondary | ICD-10-CM

## 2010-11-08 DIAGNOSIS — D696 Thrombocytopenia, unspecified: Secondary | ICD-10-CM

## 2010-11-08 LAB — CBC WITH DIFFERENTIAL/PLATELET
BASO%: 0.3 % (ref 0.0–2.0)
EOS%: 1.9 % (ref 0.0–7.0)
MCH: 38 pg — ABNORMAL HIGH (ref 25.1–34.0)
MCHC: 33.9 g/dL (ref 31.5–36.0)
MCV: 112.1 fL — ABNORMAL HIGH (ref 79.5–101.0)
MONO%: 9.5 % (ref 0.0–14.0)
NEUT%: 65.8 % (ref 38.4–76.8)
RDW: 16.7 % — ABNORMAL HIGH (ref 11.2–14.5)
lymph#: 0.7 10*3/uL — ABNORMAL LOW (ref 0.9–3.3)

## 2010-11-08 LAB — COMPREHENSIVE METABOLIC PANEL
ALT: 31 U/L (ref 0–35)
AST: 40 U/L — ABNORMAL HIGH (ref 0–37)
Alkaline Phosphatase: 93 U/L (ref 39–117)
Calcium: 9.4 mg/dL (ref 8.4–10.5)
Chloride: 102 mEq/L (ref 96–112)
Creatinine, Ser: <0.47 mg/dL — ABNORMAL LOW (ref 0.50–1.10)
Potassium: 3.9 mEq/L (ref 3.5–5.3)

## 2010-11-08 NOTE — Discharge Summary (Signed)
  [name], Brittany Blair              ACCOUNT NO.:  [id number]  MEDICAL RECORD NO.:  [id number]  LOCATION:  1320                         FACILITY:  [facility] [address] [facility]  PHYSICIAN:  Currie Paris, M.D.DATE OF BIRTH:  12-Jan-1961  DATE OF ADMISSION:  10/10/2010 DATE OF DISCHARGE:  10/12/2010                              DISCHARGE SUMMARY   FINAL DIAGNOSIS:  Carcinoma, left breast, central-4.5 cm, high-grade with 7 of 8 lymph nodes positive, a T2/N2a cancer.  MEDICAL HISTORY:  [name] presented several months ago with a fungating mass in the left breast, which was an invasive carcinoma.  She also had multiple Staph infections and the fungating lesion was contaminated with Staph.  She was treated with neoadjuvant chemotherapy and had significant improvement with at least some evidence of healing of the primary.  She was admitted for modified radical mastectomy.  HOSPITAL COURSE:  The patient was admitted and taken to the operating room where she underwent modified radical mastectomy without difficulty. She tolerated the procedure well.  Postoperatively, she had moderate pain.  There was some ecchymoses near the axilla, but no evidence of frank hematoma.  The drains were somewhat reddish, but were slowing down and thinning up.  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  On August 17, she was feeling better and was able to be discharged.  She was discharged in satisfactory condition, to resume her usual home medications, given pain medication and to continue Bactrim.  She is to follow up in my office in 1 week.     Currie Paris, M.D.     CJS/MEDQ  D:  11/05/2010  T:  11/05/2010  Job:  [id number]  Electronically Signed by Cyndia Bent M.D. on 11/08/2010 05:15:29 PM

## 2010-11-11 ENCOUNTER — Encounter (INDEPENDENT_AMBULATORY_CARE_PROVIDER_SITE_OTHER): Payer: Self-pay | Admitting: Surgery

## 2010-11-13 ENCOUNTER — Encounter (INDEPENDENT_AMBULATORY_CARE_PROVIDER_SITE_OTHER): Payer: Self-pay | Admitting: Surgery

## 2010-11-13 ENCOUNTER — Ambulatory Visit (INDEPENDENT_AMBULATORY_CARE_PROVIDER_SITE_OTHER): Payer: Self-pay | Admitting: Surgery

## 2010-11-13 VITALS — BP 142/90 | HR 84 | Temp 98.0°F | Resp 16 | Ht 64.0 in | Wt 135.2 lb

## 2010-11-13 DIAGNOSIS — C50919 Malignant neoplasm of unspecified site of unspecified female breast: Secondary | ICD-10-CM

## 2010-11-13 DIAGNOSIS — Z9889 Other specified postprocedural states: Secondary | ICD-10-CM

## 2010-11-13 MED ORDER — HYDROCODONE-ACETAMINOPHEN 5-325 MG PO TABS: 1.0000 | ORAL_TABLET | Freq: Four times a day (QID) | ORAL | Status: DC | PRN

## 2010-11-13 NOTE — Patient Instructions (Signed)
You need to be evaluated for some physical therapy as I think you have a significant limitation in her shoulder mobility. I think when the mobility improves you her pain will get better.  I'll plan to see back here for a followup visit in about three weeks.

## 2010-11-13 NOTE — Progress Notes (Signed)
NAME: Brittany Blair                                            DOB: 1961/01/24 DATE: 11/13/2010                                                  MRN: [id number]  CC: Post op left mastectomy  HPI: This patient comes in for post op follow-up.Sheunderwent Left modified mastectomy on 09/28/10. She feels that she is doing well. She is still having a moderate amount of pain PE: General: The patient appears to be healthy, NAD Breast: No residul fluid. Arm: No lymphedema, but limited ROM L DATA REVIEWED: No new data  IMPRESSION: The patient is doing well S/P mastectomy Limited ROM L   PLAN: Will make OP PT referral Refill Norco Already has ABC referral Will see in three weeks

## 2010-11-15 ENCOUNTER — Ambulatory Visit
Admission: RE | Admit: 2010-11-15 | Discharge: 2010-11-15 | Disposition: A | Discharge status: Home / Self Care | Payer: Self-pay | Source: Ambulatory Visit | Attending: Radiation Oncology | Admitting: Radiation Oncology

## 2010-11-15 DIAGNOSIS — Z170 Estrogen receptor positive status [ER+]: Secondary | ICD-10-CM | POA: Insufficient documentation

## 2010-11-15 DIAGNOSIS — Z510 Encounter for antineoplastic radiation therapy: Secondary | ICD-10-CM | POA: Insufficient documentation

## 2010-11-15 DIAGNOSIS — Z9010 Acquired absence of unspecified breast and nipple: Secondary | ICD-10-CM | POA: Insufficient documentation

## 2010-11-15 DIAGNOSIS — C50319 Malignant neoplasm of lower-inner quadrant of unspecified female breast: Secondary | ICD-10-CM | POA: Insufficient documentation

## 2010-11-15 DIAGNOSIS — Z8614 Personal history of Methicillin resistant Staphylococcus aureus infection: Secondary | ICD-10-CM | POA: Insufficient documentation

## 2010-11-15 DIAGNOSIS — C773 Secondary and unspecified malignant neoplasm of axilla and upper limb lymph nodes: Secondary | ICD-10-CM | POA: Insufficient documentation

## 2010-12-07 ENCOUNTER — Other Ambulatory Visit: Payer: Self-pay | Admitting: Physician Assistant

## 2010-12-07 ENCOUNTER — Encounter (HOSPITAL_BASED_OUTPATIENT_CLINIC_OR_DEPARTMENT_OTHER): Payer: Self-pay | Admitting: Oncology

## 2010-12-07 DIAGNOSIS — Z170 Estrogen receptor positive status [ER+]: Secondary | ICD-10-CM

## 2010-12-07 DIAGNOSIS — I10 Essential (primary) hypertension: Secondary | ICD-10-CM

## 2010-12-07 DIAGNOSIS — D696 Thrombocytopenia, unspecified: Secondary | ICD-10-CM

## 2010-12-07 DIAGNOSIS — C50119 Malignant neoplasm of central portion of unspecified female breast: Secondary | ICD-10-CM

## 2010-12-07 DIAGNOSIS — D709 Neutropenia, unspecified: Secondary | ICD-10-CM

## 2010-12-07 LAB — CBC WITH DIFFERENTIAL/PLATELET
Basophils Absolute: 0 10*3/uL (ref 0.0–0.1)
Eosinophils Absolute: 0 10*3/uL (ref 0.0–0.5)
HGB: 14.4 g/dL (ref 11.6–15.9)
LYMPH%: 20.5 % (ref 14.0–49.7)
MCV: 109.1 fL — ABNORMAL HIGH (ref 79.5–101.0)
MONO%: 10.8 % (ref 0.0–14.0)
NEUT#: 2.5 10*3/uL (ref 1.5–6.5)
Platelets: 214 10*3/uL (ref 145–400)

## 2010-12-07 LAB — COMPREHENSIVE METABOLIC PANEL
Alkaline Phosphatase: 85 U/L (ref 39–117)
BUN: 18 mg/dL (ref 6–23)
Glucose, Bld: 102 mg/dL — ABNORMAL HIGH (ref 70–99)
Total Bilirubin: 0.5 mg/dL (ref 0.3–1.2)

## 2010-12-11 ENCOUNTER — Encounter (INDEPENDENT_AMBULATORY_CARE_PROVIDER_SITE_OTHER): Payer: Self-pay | Admitting: Surgery

## 2010-12-19 ENCOUNTER — Encounter: Payer: Self-pay | Admitting: *Deleted

## 2010-12-19 ENCOUNTER — Encounter (HOSPITAL_BASED_OUTPATIENT_CLINIC_OR_DEPARTMENT_OTHER): Payer: Self-pay | Admitting: Oncology

## 2010-12-19 ENCOUNTER — Other Ambulatory Visit: Payer: Self-pay | Admitting: Physician Assistant

## 2010-12-19 DIAGNOSIS — A4902 Methicillin resistant Staphylococcus aureus infection, unspecified site: Secondary | ICD-10-CM

## 2010-12-19 DIAGNOSIS — Z170 Estrogen receptor positive status [ER+]: Secondary | ICD-10-CM

## 2010-12-19 DIAGNOSIS — C50119 Malignant neoplasm of central portion of unspecified female breast: Secondary | ICD-10-CM

## 2010-12-19 DIAGNOSIS — I10 Essential (primary) hypertension: Secondary | ICD-10-CM

## 2010-12-19 LAB — CBC WITH DIFFERENTIAL/PLATELET
Basophils Absolute: 0 10*3/uL (ref 0.0–0.1)
Eosinophils Absolute: 0.1 10*3/uL (ref 0.0–0.5)
HGB: 13.1 g/dL (ref 11.6–15.9)
LYMPH%: 13.9 % — ABNORMAL LOW (ref 14.0–49.7)
MCV: 107.2 fL — ABNORMAL HIGH (ref 79.5–101.0)
MONO%: 11.3 % (ref 0.0–14.0)
NEUT#: 2.8 10*3/uL (ref 1.5–6.5)
Platelets: 209 10*3/uL (ref 145–400)
RDW: 13.9 % (ref 11.2–14.5)

## 2010-12-31 ENCOUNTER — Ambulatory Visit
Admission: RE | Admit: 2010-12-31 | Discharge: 2010-12-31 | Disposition: A | Discharge status: Home / Self Care | Payer: Self-pay | Source: Ambulatory Visit | Attending: Radiation Oncology | Admitting: Radiation Oncology

## 2010-12-31 DIAGNOSIS — C50919 Malignant neoplasm of unspecified site of unspecified female breast: Secondary | ICD-10-CM

## 2010-12-31 NOTE — Progress Notes (Signed)
[facility] Radiation Oncology Weekly Treatment Note    Name: Brittany Blair Date: 12/31/2010 MRN: [id number] DOB: 1960/04/14  Status:outpatient    Current dose: 2700cGy  Current fraction:15  Planned dose:6040cGy  Planned fraction:33   MEDICATIONS:@MEDADMINPROSE @   ALLERGIES: Bee   LABORATORY DATA:     NARRATIVE: Brittany Blair was seen today for weekly treatment management. The chart was checked and port films images were reviewed.No complaints. Continues with Xeloda.  PHYSICAL EXAMINATION: vitals were not taken for this visit.    Wt/ 138.7 lbs. Confluent moderate erythema of skin along L chest wall and axilla. No moist desquamation.       ASSESSMENT: Patient tolerating treatments well.    PLAN: Continue treatment as planned

## 2011-01-01 ENCOUNTER — Ambulatory Visit
Admission: RE | Admit: 2011-01-01 | Discharge: 2011-01-01 | Disposition: A | Discharge status: Home / Self Care | Payer: Self-pay | Source: Ambulatory Visit | Attending: Radiation Oncology | Admitting: Radiation Oncology

## 2011-01-02 ENCOUNTER — Other Ambulatory Visit: Payer: Self-pay | Admitting: Lab

## 2011-01-02 ENCOUNTER — Ambulatory Visit (HOSPITAL_BASED_OUTPATIENT_CLINIC_OR_DEPARTMENT_OTHER): Payer: Self-pay | Admitting: Physician Assistant

## 2011-01-02 ENCOUNTER — Other Ambulatory Visit: Payer: Self-pay | Admitting: Physician Assistant

## 2011-01-02 ENCOUNTER — Ambulatory Visit
Admission: RE | Admit: 2011-01-02 | Discharge: 2011-01-02 | Disposition: A | Discharge status: Home / Self Care | Payer: Self-pay | Source: Ambulatory Visit | Attending: Radiation Oncology | Admitting: Radiation Oncology

## 2011-01-02 VITALS — BP 163/110 | HR 84 | Temp 98.4°F | Ht 64.0 in | Wt 139.7 lb

## 2011-01-02 DIAGNOSIS — A4901 Methicillin susceptible Staphylococcus aureus infection, unspecified site: Secondary | ICD-10-CM

## 2011-01-02 DIAGNOSIS — I10 Essential (primary) hypertension: Secondary | ICD-10-CM

## 2011-01-02 DIAGNOSIS — Z170 Estrogen receptor positive status [ER+]: Secondary | ICD-10-CM

## 2011-01-02 DIAGNOSIS — C50319 Malignant neoplasm of lower-inner quadrant of unspecified female breast: Secondary | ICD-10-CM

## 2011-01-02 LAB — CBC WITH DIFFERENTIAL/PLATELET
Eosinophils Absolute: 0 10*3/uL (ref 0.0–0.5)
HCT: 37.5 % (ref 34.8–46.6)
LYMPH%: 2.8 % — ABNORMAL LOW (ref 14.0–49.7)
MCV: 107.3 fL — ABNORMAL HIGH (ref 79.5–101.0)
MONO%: 5.5 % (ref 0.0–14.0)
NEUT#: 6.9 10*3/uL — ABNORMAL HIGH (ref 1.5–6.5)
NEUT%: 91.7 % — ABNORMAL HIGH (ref 38.4–76.8)
Platelets: 203 10*3/uL (ref 145–400)
RBC: 3.5 10*6/uL — ABNORMAL LOW (ref 3.70–5.45)

## 2011-01-02 NOTE — Progress Notes (Deleted)
PROBLEM: 1. A 50-year-old [place], [place] woman with a history of     locally advanced, ER/PR positive, HER-2 negative left breast     carcinoma for which she completed neoadjuvant chemotherapy     comprised of 3 cycles of dose-dense Taxotere followed by 2 cycles     of neoadjuvant dose-dense FEC complicated by her history of     methicillin-resistant Staphylococcus aureus. 2. Severe hypertension on Vasotec 10 mg per day. 3. Status post left modified radical mastectomy with axillary node     dissection on 10/10/2010 revealing ypT2 ypN1 disease.  SUBJECTIVE:  Brittany Blair is seen today with her husband in accompaniment for followup pertaining to her history of ypT2 ypN1 disease for which she is now undergoing radiation therapy under the care Dr. Murray and is utilizing Xeloda 1 g p.o. b.i.d. on radiation days alone.  Overall she feels great.  She denies any fevers, chills, night sweats, shortness of breath, or chest pain.  No nausea, emesis, diarrhea, or constipation issues.  Overall energy level is doing quite well.  She denies any hand- foot changes.  No mouth sores.  Denies any heartburn exacerbations.  REVIEW OF SYSTEMS:  Otherwise negative.  ALLERGIES:  No known drug allergies.  CURRENT MEDICATIONS:  Reviewed and updated.  ECOG STATUS:  Zero.  OBJECTIVE/PHYSICAL EXAM:  Vital signs:  Blood pressure is 163/110, pulse 84, respirations 20, temp 98.4, weight 139 pounds.  HEENT:  Conjunctivae pink.  Sclerae anicteric.  Oropharynx is benign without oral mucositis or candidiasis.  Lungs:  Clear to auscultation without wheezing or rhonchi.  Heart:  Regular rate and rhythm without murmurs, rubs, gallops, or clicks.  Abdomen:  Soft, normal bowel sounds.  Extremities: Free of pedal edema or PPE changes.  Neurologic:  Exam is nonfocal.  LABORATORY DATA:  Hemoglobin 12.9 g, platelet count 203,000, WBC 7500 with an ANC of 6900.  IMPRESSION:  A 50-year-old , [place]  woman with a history of ypT2 ypN2, ER/PR positive, HER-2 negative left breast carcinoma with superimposed methicillin-resistant Staphylococcus aureus. She completed 3 cycles of neoadjuvant dose-dense Taxotere, followed by 2 of 4 planned cycles of neoadjuvant dose-dense FEC but due to recurrent hospitalizations for neutropenia and a MRSA outbreak, it was felt prudent to stop.  She subsequently underwent a left modified radical mastectomy with axillary node dissection revealing ypT2 ypN2 residual disease.  She had extracapsular extension within her axillary lymph node dissection sample.  She is now under radiation therapy with Xeloda for chemosensitization.  She also has a history of hypertension under fair to poor control.  Case has been reviewed with Dr. Rubin.  PLAN:  First off, Shelly will continue radiation under the care of Dr. Murray with Xeloda for chemosensitization.  Pertaining to her hypertension, I will increase her lisinopril to 20 mg per day.  That prescription has been provided.  I will see her in 2 weeks' time for followup.  She knows to contact us sooner if the need should arise.    ______________________________ Brittany Scherer, PA CS/MEDQ  D:  01/02/2011  T:  01/02/2011  Job:  [id number] 

## 2011-01-02 NOTE — Progress Notes (Signed)
Progress note dictated-CTS 

## 2011-01-03 ENCOUNTER — Telehealth: Payer: Self-pay | Admitting: Radiation Oncology

## 2011-01-03 ENCOUNTER — Ambulatory Visit
Admission: RE | Admit: 2011-01-03 | Discharge: 2011-01-03 | Disposition: A | Discharge status: Home / Self Care | Payer: Self-pay | Source: Ambulatory Visit | Attending: Radiation Oncology | Admitting: Radiation Oncology

## 2011-01-03 NOTE — Telephone Encounter (Signed)
ALIGHT $361.77 [facility] $400

## 2011-01-04 ENCOUNTER — Ambulatory Visit
Admission: RE | Admit: 2011-01-04 | Discharge: 2011-01-04 | Disposition: A | Discharge status: Home / Self Care | Payer: Self-pay | Source: Ambulatory Visit | Attending: Radiation Oncology | Admitting: Radiation Oncology

## 2011-01-04 NOTE — Progress Notes (Deleted)
PROBLEM: 1. A 50-year-old [place], [place] woman with a history of     locally advanced, ER/PR positive, HER-2 negative left breast     carcinoma for which she completed neoadjuvant chemotherapy     comprised of 3 cycles of dose-dense Taxotere followed by 2 cycles     of neoadjuvant dose-dense FEC complicated by her history of     methicillin-resistant Staphylococcus aureus. 2. Severe hypertension on Vasotec 10 mg per day. 3. Status post left modified radical mastectomy with axillary node     dissection on 10/10/2010 revealing ypT2 ypN1 disease.  SUBJECTIVE:  Brittany Blair is seen today with her husband in accompaniment for followup pertaining to her history of ypT2 ypN1 disease for which she is now undergoing radiation therapy under the care Dr. Murray and is utilizing Xeloda 1 g p.o. b.i.d. on radiation days alone.  Overall she feels great.  She denies any fevers, chills, night sweats, shortness of breath, or chest pain.  No nausea, emesis, diarrhea, or constipation issues.  Overall energy level is doing quite well.  She denies any hand- foot changes.  No mouth sores.  Denies any heartburn exacerbations.  REVIEW OF SYSTEMS:  Otherwise negative.  ALLERGIES:  No known drug allergies.  CURRENT MEDICATIONS:  Reviewed and updated.  ECOG STATUS:  Zero.  OBJECTIVE/PHYSICAL EXAM:  Vital signs:  Blood pressure is 163/110, pulse 84, respirations 20, temp 98.4, weight 139 pounds.  HEENT:  Conjunctivae pink.  Sclerae anicteric.  Oropharynx is benign without oral mucositis or candidiasis.  Lungs:  Clear to auscultation without wheezing or rhonchi.  Heart:  Regular rate and rhythm without murmurs, rubs, gallops, or clicks.  Abdomen:  Soft, normal bowel sounds.  Extremities: Free of pedal edema or PPE changes.  Neurologic:  Exam is nonfocal.  LABORATORY DATA:  Hemoglobin 12.9 g, platelet count 203,000, WBC 7500 with an ANC of 6900.  IMPRESSION:  A 50-year-old [place], [place]  woman with a history of ypT2 ypN2, ER/PR positive, HER-2 negative left breast carcinoma with superimposed methicillin-resistant Staphylococcus aureus. She completed 3 cycles of neoadjuvant dose-dense Taxotere, followed by 2 of 4 planned cycles of neoadjuvant dose-dense FEC but due to recurrent hospitalizations for neutropenia and a MRSA outbreak, it was felt prudent to stop.  She subsequently underwent a left modified radical mastectomy with axillary node dissection revealing ypT2 ypN2 residual disease.  She had extracapsular extension within her axillary lymph node dissection sample.  She is now under radiation therapy with Xeloda for chemosensitization.  She also has a history of hypertension under fair to poor control.  Case has been reviewed with Dr. Rubin.  PLAN:  First off, Brittany Blair will continue radiation under the care of Dr. Murray with Xeloda for chemosensitization.  Pertaining to her hypertension, I will increase her lisinopril to 20 mg per day.  That prescription has been provided.  I will see her in 2 weeks' time for followup.  She knows to contact us sooner if the need should arise.    ______________________________ Kadeja Granada, PA CS/MEDQ  D:  01/02/2011  T:  01/02/2011  Job:  [id number] 

## 2011-01-07 ENCOUNTER — Ambulatory Visit
Admission: RE | Admit: 2011-01-07 | Discharge: 2011-01-07 | Disposition: A | Discharge status: Home / Self Care | Payer: Self-pay | Source: Ambulatory Visit | Attending: Radiation Oncology | Admitting: Radiation Oncology

## 2011-01-07 VITALS — Wt 141.5 lb

## 2011-01-07 DIAGNOSIS — C50319 Malignant neoplasm of lower-inner quadrant of unspecified female breast: Secondary | ICD-10-CM

## 2011-01-07 NOTE — Progress Notes (Signed)
PROBLEM: 32. 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 2. Severe hypertension on Vasotec 10 mg per day. 3. Status post left modified radical mastectomy with axillary node     dissection on 10/10/2010 revealing ypT2 ypN1 disease.  SUBJECTIVE:  Merri is seen today with her husband in accompaniment for followup pertaining to her history of ypT2 ypN1 disease for which she is now undergoing radiation therapy under the care Dr. Dayton Scrape and is utilizing Xeloda 1 g p.o. b.i.d. on radiation days alone.  Overall she feels great.  She denies any fevers, chills, night sweats, shortness of breath, or chest pain.  No nausea, emesis, diarrhea, or constipation issues.  Overall energy level is doing quite well.  She denies any hand- foot changes.  No mouth sores.  Denies any heartburn exacerbations.  REVIEW OF SYSTEMS:  Otherwise negative.  ALLERGIES:  No known drug allergies.  CURRENT MEDICATIONS:  Reviewed and updated.  ECOG STATUS:  Zero.  OBJECTIVE/PHYSICAL EXAM:  Vital signs:  Blood pressure is 163/110, pulse 84, respirations 20, temp 98.4, weight 139 pounds.  HEENT:  Conjunctivae pink.  Sclerae anicteric.  Oropharynx is benign without oral mucositis or candidiasis.  Lungs:  Clear to auscultation without wheezing or rhonchi.  Heart:  Regular rate and rhythm without murmurs, rubs, gallops, or clicks.  Abdomen:  Soft, normal bowel sounds.  Extremities: Free of pedal edema or PPE changes.  Neurologic:  Exam is nonfocal.  LABORATORY DATA:  Hemoglobin 12.9 g, platelet count 203,000, WBC 7500 with an ANC of 6900.  IMPRESSION:  A 50 year old [place], [place]  woman with a history of ypT2 ypN2, ER/PR positive, HER-2 negative left breast carcinoma with superimposed methicillin-resistant Staphylococcus aureus. She completed 3 cycles of neoadjuvant dose-dense Taxotere, followed by 2 of 4 planned cycles of neoadjuvant dose-dense FEC but due to recurrent hospitalizations for neutropenia and a MRSA outbreak, it was felt prudent to stop.  She subsequently underwent a left modified radical mastectomy with axillary node dissection revealing ypT2 ypN2 residual disease.  She had extracapsular extension within her axillary lymph node dissection sample.  She is now under radiation therapy with Xeloda for chemosensitization.  She also has a history of hypertension under fair to poor control.  Case has been reviewed with Dr. Donnie Coffin.  PLAN:  First off, Syndey will continue radiation under the care of Dr. Dayton Scrape with Xeloda for chemosensitization.  Pertaining to her hypertension, I will increase her lisinopril to 20 mg per day.  That prescription has been provided.  I will see her in 2 weeks' time for followup.  She knows to contact us sooner if the need should arise.    ______________________________ Sharyl Nimrod, PA CS/MEDQ  D:  01/02/2011  T:  01/02/2011  Job:  [id number]

## 2011-01-07 NOTE — Progress Notes (Signed)
[facility] Radiation Oncology Weekly Treatment Note    Name: Brittany Blair Date: 01/07/2011 MRN: [id number] DOB: [birth year]  Status:outpatient    Current dose: 3600cGy  Current fraction:20  Planned dose:6040cGy  Planned fraction:33   MEDICATIONS:Current outpatient prescriptions:acyclovir (ZOVIRAX) 400 MG tablet, Take 400 mg by mouth daily. , Disp: , Rfl: ;  ALPRAZolam (XANAX) 0.5 MG tablet, Take 1 mg by mouth 3 (three) times daily as needed. , Disp: , Rfl: ;  enalapril (VASOTEC) 10 MG tablet, Take 5 mg by mouth daily. , Disp: , Rfl: ;  FLUoxetine (PROZAC) 20 MG tablet, Take 40 mg by mouth daily.  , Disp: , Rfl:  HYDROcodone-acetaminophen (NORCO) 5-325 MG per tablet, Take 1 tablet by mouth every 6 (six) hours as needed., Disp: 30 tablet, Rfl: 0;  lisinopril (PRINIVIL,ZESTRIL) 20 MG tablet, Take 20 mg by mouth daily.  , Disp: , Rfl: ;  loratadine (CLARITIN) 10 MG tablet, Take 10 mg by mouth daily.  , Disp: , Rfl:    ALLERGIES: Bee   LABORATORY DATA: Nov. 7 CBC satisfactory.    NARRATIVE: Brittany Blair was seen today for weekly treatment management. The chart was checked and port films images were reviewed. She is also receiving Xeloda with her radiation therapy. She reports a fair amount of pain along her left chest wall and she has been taking hydrocodone. She is using radio Plex gel, and we may change her to Biafine cream.  PHYSICAL EXAMINATION: weight is 141 lb 8 oz (64.184 kg).         There is marked erythema of skin along L chest wall with impending desquamation.    ASSESSMENT: Patient tolerating treatments well. 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 I spoke with Dr. Pierce Crane and he is in agreement. I will check her skin reaction again this Wednesday.   PLAN: Continue treatment as planned.

## 2011-01-07 NOTE — Progress Notes (Signed)
Noted bright red in tx area states pain 5/10, awakens her at night.  Using radiaplex

## 2011-01-08 ENCOUNTER — Ambulatory Visit
Admission: RE | Admit: 2011-01-08 | Discharge: 2011-01-08 | Disposition: A | Discharge status: Home / Self Care | Payer: Self-pay | Source: Ambulatory Visit | Attending: Radiation Oncology | Admitting: Radiation Oncology

## 2011-01-09 ENCOUNTER — Ambulatory Visit
Admission: RE | Admit: 2011-01-09 | Discharge: 2011-01-09 | Disposition: A | Discharge status: Home / Self Care | Payer: Self-pay | Source: Ambulatory Visit | Attending: Radiation Oncology | Admitting: Radiation Oncology

## 2011-01-10 ENCOUNTER — Ambulatory Visit
Admission: RE | Admit: 2011-01-10 | Discharge: 2011-01-10 | Disposition: A | Discharge status: Home / Self Care | Payer: Self-pay | Source: Ambulatory Visit | Attending: Radiation Oncology | Admitting: Radiation Oncology

## 2011-01-11 ENCOUNTER — Ambulatory Visit: Payer: Self-pay

## 2011-01-14 ENCOUNTER — Ambulatory Visit
Admission: RE | Admit: 2011-01-14 | Discharge: 2011-01-14 | Disposition: A | Discharge status: Home / Self Care | Payer: Self-pay | Source: Ambulatory Visit | Attending: Radiation Oncology | Admitting: Radiation Oncology

## 2011-01-14 ENCOUNTER — Ambulatory Visit: Admission: RE | Admit: 2011-01-14 | Payer: Self-pay | Source: Ambulatory Visit

## 2011-01-14 VITALS — Wt 143.5 lb

## 2011-01-14 DIAGNOSIS — C50319 Malignant neoplasm of lower-inner quadrant of unspecified female breast: Secondary | ICD-10-CM

## 2011-01-14 MED ORDER — HYDROCODONE-ACETAMINOPHEN 5-325 MG PO TABS: 2.0000 | ORAL_TABLET | Freq: Four times a day (QID) | ORAL | Status: DC | PRN

## 2011-01-14 NOTE — Progress Notes (Signed)
BRIGHT RED WITH NO DESQUAMATION NOTED.  SAID SHE DIDN'T COME Friday IT HURT SO BAD.  SAYS IT WAKES HER UP DURING NIGHT

## 2011-01-14 NOTE — Progress Notes (Signed)
The patient has thus far received 4140 cGy in 23 sessions to her left chest wall and regional lymph nodes. She is having more discomfort along her left chest wall. We stopped her Xeloda last week. She has been taking hydrocodone/APAP tablets her pain remains quite severe. She would like to stop radiation therapy if through this week and resume early next week. She has been using Radioplex gel. On examination there is marked erythema along her left chest wall with impending desquamation. Impression: She has severe radiation dermatitis and hands by sensitizing Xeloda. Plan: I will rest her to remainder of this week on November 26.

## 2011-01-15 ENCOUNTER — Ambulatory Visit: Payer: Self-pay

## 2011-01-16 ENCOUNTER — Other Ambulatory Visit: Payer: Self-pay | Admitting: Lab

## 2011-01-16 ENCOUNTER — Ambulatory Visit (HOSPITAL_BASED_OUTPATIENT_CLINIC_OR_DEPARTMENT_OTHER): Payer: Self-pay | Admitting: Physician Assistant

## 2011-01-16 ENCOUNTER — Other Ambulatory Visit: Payer: Self-pay

## 2011-01-16 ENCOUNTER — Telehealth: Payer: Self-pay | Admitting: *Deleted

## 2011-01-16 ENCOUNTER — Ambulatory Visit: Payer: Self-pay

## 2011-01-16 VITALS — BP 137/98 | HR 91 | Temp 97.9°F | Ht 64.0 in | Wt 144.1 lb

## 2011-01-16 DIAGNOSIS — Z170 Estrogen receptor positive status [ER+]: Secondary | ICD-10-CM

## 2011-01-16 DIAGNOSIS — R234 Changes in skin texture: Secondary | ICD-10-CM

## 2011-01-16 DIAGNOSIS — C50919 Malignant neoplasm of unspecified site of unspecified female breast: Secondary | ICD-10-CM

## 2011-01-16 DIAGNOSIS — C50319 Malignant neoplasm of lower-inner quadrant of unspecified female breast: Secondary | ICD-10-CM

## 2011-01-16 DIAGNOSIS — I10 Essential (primary) hypertension: Secondary | ICD-10-CM

## 2011-01-16 LAB — CBC WITH DIFFERENTIAL/PLATELET
EOS%: 0.9 % (ref 0.0–7.0)
Eosinophils Absolute: 0 10*3/uL (ref 0.0–0.5)
HGB: 11.8 g/dL (ref 11.6–15.9)
MCV: 106.4 fL — ABNORMAL HIGH (ref 79.5–101.0)
MONO%: 13.1 % (ref 0.0–14.0)
NEUT#: 3.7 10*3/uL (ref 1.5–6.5)
RBC: 3.19 10*6/uL — ABNORMAL LOW (ref 3.70–5.45)
RDW: 16.3 % — ABNORMAL HIGH (ref 11.2–14.5)
lymph#: 0.3 10*3/uL — ABNORMAL LOW (ref 0.9–3.3)

## 2011-01-16 NOTE — Progress Notes (Signed)
Progress note dictated-CTS 

## 2011-01-16 NOTE — Telephone Encounter (Signed)
Gave patient appointment for 02-04-2011

## 2011-01-16 NOTE — Progress Notes (Signed)
PROBLEM: 54. A 50 year old Brittany Blair, Brittany Blair, Brittany Blair with a history of a     locally advanced, ER/PR positive, HER2-negative left breast     carcinoma for which she completed neoadjuvant chemotherapy     comprised of 3 cycles of dose-dense Taxotere followed by 2 cycles     of neoadjuvant dose-dense FEC complicated by her history of MRSA. 2. Severe hypertension on Norvasc 20 mg per day. 3. S/P left modified radical mastectomy with axillary node dissection     on 10/10/2010 revealing residual ypT2 yPN2 disease on active     radiation therapy with Xeloda chemosensitization.  SUBJECTIVE:  Brittany Blair is seen today for followup pertaining to her history of a ypT2 yPN2 breast carcinoma for which she is now undergoing radiation under the care of Dr. Dayton Scrape.  She had been utilizing Xeloda but notes that it has really been held about 2 weeks now due to the fact that she has had quite a bit of skin change.  In fact, since 01/11/2011. She has had no radiation, to regroup on 01/21/2011.  Overall, though, she feels well denying any unexplained fevers, chills, night sweats, shortness of breath, or chest pain.  The pain in her radiation site seems to be well-controlled with Norco.  She continues on Xanax as needed.  Her appetite is good.  Her sense of taste has return. She denies any diarrhea or constipation symptoms.  REVIEW OF SYSTEMS:  Otherwise negative.  ALLERGIES:  No known drug allergies.  CURRENT MEDICATIONS:  Reviewed with the patient, as per EMR.  ECOG status of 1.  PHYSICAL EXAMINATION:  Vital signs:  Blood pressure is 137/98, pulse 91, respirations 20, temp 97.9, and weight 144 pounds. HEENT: Conjunctivae pink.  Sclerae anicteric.  Oropharynx is benign without oral mucositis or candidiasis.  Lungs are clear to auscultation.  No evidence of wheezing or rhonchi.  Heart:  Regular rate and rhythm.  The obvious radiation port site on the left anterior chest wall is present.  She does  have some evidence of skin breakdown in the left axilla region.  It appears fairly dry.  Abdomen:  Soft, nontender without organomegaly. Normal bowel sounds.  Extremities are free of pedal edema.  Neurologic exam is nonfocal.  The patient is alert and orient x3.  LABORATORY DATA:  Hemoglobin 11.8 g, platelet count 233,000,and WBC 4700 with an ANC of 3700.  IMPRESSION: 1. A 50 year old Brittany Cornell, Brittany Blair, Brittany Blair with a history of a     ypT2 yPN2 disease, ER/PR positive, and HER2-negative left breast     carcinoma with history of superimposed methicillin-resistant     Staphylococcus aureus.  She completed 3 cycles of neoadjuvant dose-     dense Taxotere followed by 2/4 planned cycles of neoadjuvant dose-     dense FEC.  She had recurrent hospitalizations for neutropenia and     a subsequent methicillin-resistant Staphylococcus aureus outbreak,     therefore we felt prudent to stop her neoadjuvant therapy.  She     underwent a left modified radical mastectomy with axillary node     dissection.  She had evidence of extracapsular extension within her     axillary node dissection sample. She is now under radiation with     Xeloda for chemosensitization, but actually both on hold. 2. History of hypertension.  Case has been reviewed with Dr. Donnie Coffin.  PLAN:  Brittany Blair will follow up with Rad/Onc on 01/21/2011 as scheduled. At this point, we will probably continue to hold her  Xeloda until she has completed radiation.  We will actually see her the week of December 10th for followup. She understands and agrees with this plan.    ______________________________ Brittany Nimrod, PA CS/MEDQ  D:  01/16/2011  T:  01/16/2011  Job:  [id number]

## 2011-01-18 ENCOUNTER — Ambulatory Visit: Payer: Self-pay

## 2011-01-21 ENCOUNTER — Ambulatory Visit
Admission: RE | Admit: 2011-01-21 | Discharge: 2011-01-21 | Disposition: A | Discharge status: Home / Self Care | Payer: Self-pay | Source: Ambulatory Visit | Attending: Radiation Oncology | Admitting: Radiation Oncology

## 2011-01-21 VITALS — Wt 144.1 lb

## 2011-01-21 DIAGNOSIS — C50319 Malignant neoplasm of lower-inner quadrant of unspecified female breast: Secondary | ICD-10-CM

## 2011-01-21 NOTE — Progress Notes (Signed)
Weekly Management Note:  Site:L chest wall and regional LNs Current Dose:  43420  cGy Projected Dose: 6040  cGy  Narrative: The patient is seen today for routine under treatment assessment. Port films were reviewed. The chart was reviewed.   The patient is doing much better after having a 10 day rest.She takes Vicodin when necessary discomfort. She is also using Radioplex gel when necessary.  Physical Examination: There were no vitals filed for this visit..  Weight: 144 lb 1.6 oz (65.363 kg). There is marked hyperpigmentation of the skin with dry desquamation. There are no areas of moist desquamation.  Impression: Tolerating radiation therapy well.  Plan: Continue radiation therapy as planned.

## 2011-01-21 NOTE — Progress Notes (Signed)
NOTED DARK RED SKIN WITH MODERATE AMT OF DRY DESQUAMATION

## 2011-01-22 ENCOUNTER — Ambulatory Visit
Admission: RE | Admit: 2011-01-22 | Discharge: 2011-01-22 | Disposition: A | Discharge status: Home / Self Care | Payer: Self-pay | Source: Ambulatory Visit | Attending: Radiation Oncology | Admitting: Radiation Oncology

## 2011-01-23 ENCOUNTER — Ambulatory Visit
Admission: RE | Admit: 2011-01-23 | Discharge: 2011-01-23 | Disposition: A | Discharge status: Home / Self Care | Payer: Self-pay | Source: Ambulatory Visit | Attending: Radiation Oncology | Admitting: Radiation Oncology

## 2011-01-23 ENCOUNTER — Encounter: Payer: Self-pay | Admitting: Radiation Oncology

## 2011-01-23 NOTE — Progress Notes (Signed)
[employer] Simulation Note:   The patient underwent electron beam simulation for her left chest wall/mastectomy scar boost. She was set up to LAO, en face. One custom block was constructed to shield the normal surrounding structures. A special port plan is requested. I am prescribing 1000 cGy in 5 sessions utilizing 6 MEV electrons. 0.5 cm of custom bolus will be constructed in apply to her skin on the first day of her treatment.

## 2011-01-24 ENCOUNTER — Other Ambulatory Visit: Payer: Self-pay | Admitting: Radiation Oncology

## 2011-01-24 ENCOUNTER — Ambulatory Visit
Admission: RE | Admit: 2011-01-24 | Discharge: 2011-01-24 | Disposition: A | Discharge status: Home / Self Care | Payer: Self-pay | Source: Ambulatory Visit | Attending: Radiation Oncology | Admitting: Radiation Oncology

## 2011-01-24 MED ORDER — HYDROCODONE-ACETAMINOPHEN 5-325 MG PO TABS: 2.0000 | ORAL_TABLET | Freq: Four times a day (QID) | ORAL | Status: DC | PRN

## 2011-01-24 NOTE — Progress Notes (Signed)
Email sent to Tami K to process funds for EPP.   Bills in the [address] [facility]  - $34.31  [employer] [phone] [employer] - $229.73 Also, $16.39 need to be taken from her [facility] funds due to she will now have exhausted all her [employer] funds at this time.  Please pay all by 02/22/2011 - I have done all promise to pays.  Note: On the [employer] I have put a note on it. Montez Morita said to send that note and copy of bill with pymt.

## 2011-01-25 ENCOUNTER — Ambulatory Visit
Admission: RE | Admit: 2011-01-25 | Discharge: 2011-01-25 | Disposition: A | Discharge status: Home / Self Care | Payer: Self-pay | Source: Ambulatory Visit | Attending: Radiation Oncology | Admitting: Radiation Oncology

## 2011-01-28 ENCOUNTER — Ambulatory Visit
Admission: RE | Admit: 2011-01-28 | Discharge: 2011-01-28 | Disposition: A | Discharge status: Home / Self Care | Payer: Self-pay | Source: Ambulatory Visit | Attending: Radiation Oncology | Admitting: Radiation Oncology

## 2011-01-28 VITALS — BP 141/105 | HR 88 | Temp 97.7°F | Wt 146.6 lb

## 2011-01-28 DIAGNOSIS — C50319 Malignant neoplasm of lower-inner quadrant of unspecified female breast: Secondary | ICD-10-CM

## 2011-01-28 NOTE — Progress Notes (Signed)
Weekly Management Note:  Site:L chestwall boost Current Dose:  5240  cGy Projected Dose: 6040  cGy  Narrative: The patient is seen today for routine under treatment assessment. CBCT/MVCT images/port films were reviewed. The chart was reviewed.   She is without new complaints today. She is comfortable on her hydrocodone/APAP.  Physical Examination:  Filed Vitals:   01/28/11 1622  BP: 141/105  Pulse: 88  Temp: 97.7 F (36.5 C)  .  Weight: 146 lb 9.6 oz (66.497 kg). There is marked hyperpigmentation, erythema and patchy dry desquamation the skin the left chest wall. There are no areas of moist desquamation.  Impression: Tolerating radiation therapy well. She'll finish her radiation therapy this Friday and I'll see her again this Thursday.  Plan: Continue radiation therapy as planned.

## 2011-01-28 NOTE — Progress Notes (Signed)
1/5 fractions left chest wall boost.  Dry desquamation left chest wall and left axilla with erythema.  C/o pain , level 2 -3 in axilla.  Using Hydrocodone with relief and biafine.

## 2011-01-29 ENCOUNTER — Ambulatory Visit
Admission: RE | Admit: 2011-01-29 | Discharge: 2011-01-29 | Disposition: A | Discharge status: Home / Self Care | Payer: Self-pay | Source: Ambulatory Visit | Attending: Radiation Oncology | Admitting: Radiation Oncology

## 2011-01-30 ENCOUNTER — Ambulatory Visit
Admission: RE | Admit: 2011-01-30 | Discharge: 2011-01-30 | Disposition: A | Discharge status: Home / Self Care | Payer: Self-pay | Source: Ambulatory Visit | Attending: Radiation Oncology | Admitting: Radiation Oncology

## 2011-01-31 ENCOUNTER — Encounter: Payer: Self-pay | Admitting: Radiation Oncology

## 2011-01-31 ENCOUNTER — Ambulatory Visit
Admission: RE | Admit: 2011-01-31 | Discharge: 2011-01-31 | Disposition: A | Discharge status: Home / Self Care | Payer: Self-pay | Source: Ambulatory Visit | Attending: Radiation Oncology | Admitting: Radiation Oncology

## 2011-01-31 NOTE — Progress Notes (Signed)
Weekly Management Note:  Site:L chest wall/mastectomy scar boost Current Dose:  5840  cGy Projected Dose: 6040  cGy  Narrative: The patient is seen today for routine under treatment assessment. CBCT/MVCT images/port films were reviewed. The chart was reviewed.   She is without complaints today.  Physical Examination: There were no vitals filed for this visit..  Weight:  . There remains patchy dry desquamation of the skin with marked hyperpigmentation. There are no areas of moist disclamation.  Impression: Tolerating radiation therapy well.  Plan: Continue radiation therapy as planned. She'll finish her radiation therapy tomorrow and see me for a followup visit in one month.

## 2011-02-01 ENCOUNTER — Ambulatory Visit
Admission: RE | Admit: 2011-02-01 | Discharge: 2011-02-01 | Disposition: A | Discharge status: Home / Self Care | Payer: Self-pay | Source: Ambulatory Visit | Attending: Radiation Oncology | Admitting: Radiation Oncology

## 2011-02-04 ENCOUNTER — Telehealth: Payer: Self-pay | Admitting: Oncology

## 2011-02-04 ENCOUNTER — Encounter: Payer: Self-pay | Admitting: Radiation Oncology

## 2011-02-04 ENCOUNTER — Other Ambulatory Visit (HOSPITAL_BASED_OUTPATIENT_CLINIC_OR_DEPARTMENT_OTHER): Payer: Self-pay | Admitting: Lab

## 2011-02-04 ENCOUNTER — Ambulatory Visit (HOSPITAL_BASED_OUTPATIENT_CLINIC_OR_DEPARTMENT_OTHER): Payer: Self-pay | Admitting: Physician Assistant

## 2011-02-04 ENCOUNTER — Encounter (INDEPENDENT_AMBULATORY_CARE_PROVIDER_SITE_OTHER): Payer: Self-pay | Admitting: Surgery

## 2011-02-04 VITALS — BP 144/105 | HR 112 | Temp 98.7°F | Ht 64.0 in | Wt 144.0 lb

## 2011-02-04 DIAGNOSIS — I10 Essential (primary) hypertension: Secondary | ICD-10-CM

## 2011-02-04 DIAGNOSIS — C50319 Malignant neoplasm of lower-inner quadrant of unspecified female breast: Secondary | ICD-10-CM

## 2011-02-04 DIAGNOSIS — Z170 Estrogen receptor positive status [ER+]: Secondary | ICD-10-CM

## 2011-02-04 DIAGNOSIS — F17200 Nicotine dependence, unspecified, uncomplicated: Secondary | ICD-10-CM

## 2011-02-04 DIAGNOSIS — C50119 Malignant neoplasm of central portion of unspecified female breast: Secondary | ICD-10-CM

## 2011-02-04 LAB — CBC WITH DIFFERENTIAL/PLATELET
Basophils Absolute: 0 10*3/uL (ref 0.0–0.1)
Eosinophils Absolute: 0 10*3/uL (ref 0.0–0.5)
HGB: 12.5 g/dL (ref 11.6–15.9)
MONO#: 0.9 10*3/uL (ref 0.1–0.9)
NEUT#: 5.9 10*3/uL (ref 1.5–6.5)
Platelets: 296 10*3/uL (ref 145–400)
RBC: 3.34 10*6/uL — ABNORMAL LOW (ref 3.70–5.45)
RDW: 17.8 % — ABNORMAL HIGH (ref 11.2–14.5)
WBC: 7.4 10*3/uL (ref 3.9–10.3)

## 2011-02-04 LAB — COMPREHENSIVE METABOLIC PANEL
Albumin: 4.1 g/dL (ref 3.5–5.2)
CO2: 27 mEq/L (ref 19–32)
Calcium: 10.1 mg/dL (ref 8.4–10.5)
Glucose, Bld: 93 mg/dL (ref 70–99)
Potassium: 4.7 mEq/L (ref 3.5–5.3)
Sodium: 135 mEq/L (ref 135–145)
Total Protein: 7.7 g/dL (ref 6.0–8.3)

## 2011-02-04 MED ORDER — LISINOPRIL 20 MG PO TABS: 20.0000 mg | ORAL_TABLET | Freq: Every day | ORAL | Status: DC

## 2011-02-04 MED ORDER — TAMOXIFEN CITRATE 20 MG PO TABS: 20.0000 mg | ORAL_TABLET | Freq: Every day | ORAL | Status: AC

## 2011-02-04 NOTE — Progress Notes (Signed)
Progress note dictated-CTS 

## 2011-02-04 NOTE — Telephone Encounter (Signed)
appt given to pt for 03/2011 aom

## 2011-02-04 NOTE — Progress Notes (Signed)
PROBLEM: 51. A 50 year old [place], [place] woman with a history of a     locally advanced, ER/PR positive, HER-2/neu negative left breast     carcinoma for which she completed 3 cycles of dose-dense Taxotere     followed by 2 cycles of neoadjuvant dose-dense FEC complicated by     her history of MRSA. 2. History of severe hypertension on Norvasc 20 mg per day. 3. S/P left modified radical mastectomy with axillary lymph node     dissection on 10/10/2010 revealing residual ypT2 ypN2 disease.     Radiation completed with chemotherapy with Xeloda as chemo     sensitization on 02/01/2011.  The Xeloda was held for the last     several treatments due to significant hypersensitivity.  SUBJECTIVE:  Brittany Blair is seen today with her husband in accompaniment for followup pertaining to her history of ypT2 ypN2, ER/PR positive, HER- 2/neu negative breast carcinoma, for which she has completed radiation therapy under the care Dr. Dayton Scrape on 02/01/2011.  Overall, she feels quite well.  She denies any fevers, chills, night sweats, shortness of breath, chest pain.  No nausea, emesis, diarrhea, or constipation issues.  REVIEW OF SYSTEMS:  Negative.  ALLERGIES:  No known drug allergies.  CURRENT MEDICATIONS:  As per EMR.  ECOG STATUS:  1.  PHYSICAL EXAM:  Vital Signs:  Blood pressure is 144/105, pulse 112, respirations 20, temp 98.7, weight 144 pounds.  HEENT:  Conjunctivae pink.  Sclerae anicteric.  Oropharynx is benign without oral mucositis or candidosis.  Lungs:  Clear to auscultation.  No wheezing or rhonchi. Heart:  Regular rate and rhythm without murmurs, rubs, gallops, or clicks.  The left anterior chest wall port site is healing beautifully. Full exam deferred.  LABORATORY DATA:  Hemoglobin is 12.5 g, platelet count 296,000 WBC 7400 with an ANC of 5900.  IMPRESSION: 51. A 50 year old [place], [place] woman with a history of a     locally advanced, ER/PR positive,  HER-2/neu negative left breast     carcinoma for which she completed neoadjuvant chemotherapy     comprised of 3 cycles of dose-dense Taxotere followed by 2 cycles     of neoadjuvant dose-dense FEC, complicated by a history of MRSA,     S/P left modified radical mastectomy with axillary node dissection     on 10/10/2010 revealing ypT2 ypN2 disease on radiation therapy with     Xeloda chemo sensitization completed on 02/01/2011. 2. History of severe hypertension on Norvasc 20 mg per day.  The case     has been reviewed with Dr. Donnie Coffin who also spoke with Brittany Blair.  PLAN:  At this point, we will initiate tamoxifen 20 mg p.o. daily.  Side effect profile has been reviewed including the risk of DVT.  Of note, the patient smokes about 3 cigarettes per day.  She and her husband will be serious about quitting.  We will plan on seeing her back in 2 months for followup.  She understands and agrees with this plan.    ______________________________ Sharyl Nimrod, PA CS/MEDQ  D:  02/04/2011  T:  02/04/2011  Job:  [id number]

## 2011-02-04 NOTE — Progress Notes (Signed)
[facility] Radiation Oncology  Name: Brittany Blair MRN: [id number]  Date: 02/04/2011  DOB: Jun 15, 1960  Status:outpatient    CC: [place], PA, PA  , Pierce Crane, MD, Cicero Duck, MD  REFERRING PHYSICIAN:  Ardeen Garland, MD     DIAGNOSIS: Stage IIIB [phone])  invasive ductal carcinoma of the left breast  INDICATION FOR TREATMENT: Curative   TREATMENT DATES: 12/11/2010 through 02/01/2011   SITE/DOSE: Left chest wall and regional lymph nodes 5040 cGy 28 sessions. 1.0 cm bolus applied to the left chest wall every other day.   BEAMS/ENERGY: Mixed 6 MV/18 MV photons with forward planning to her left chest wall. 6 MV photons RAO to the left supraclavicular/axillary region, doses prescribed at 3 cm depth.   NARRATIVE: The patient had a difficult time during the course of therapy in terms of her skin toxicity. She received concomitant Xeloda chemotherapy under the direction of Dr. Donnie Coffin. She developed a significant moist desquamation requiring a one-week rest towards the end of her therapy. She was treated with antibiotic ointments and hydrocodone/APAP when necessary pain.   PLAN: Routine followup in one month. Patient instructed to call if questions or worsening complaints in interim.

## 2011-03-05 ENCOUNTER — Encounter: Payer: Self-pay | Admitting: Radiation Oncology

## 2011-03-05 ENCOUNTER — Ambulatory Visit
Admission: RE | Admit: 2011-03-05 | Discharge: 2011-03-05 | Disposition: A | Discharge status: Home / Self Care | Payer: Self-pay | Source: Ambulatory Visit | Attending: Radiation Oncology | Admitting: Radiation Oncology

## 2011-03-05 VITALS — BP 164/134 | HR 116 | Temp 97.3°F | Resp 18 | Wt 147.4 lb

## 2011-03-05 DIAGNOSIS — C50319 Malignant neoplasm of lower-inner quadrant of unspecified female breast: Secondary | ICD-10-CM

## 2011-03-05 NOTE — Progress Notes (Signed)
Patient presents to the clinic today accompanied by her husband and grandson for a follow up appointment with Dr. Dayton Scrape. Patient reports her daughter has recently moved back in. Patient is alert and oriented to person, place, and time. No distress noted. Steady gait noted. Pleasant affect /noted. Patient denies pain at this time. Completed Xeloda 02/01/11. Patient reports she has the script for Tamoxifen and plans to start taking it next week. Patient reports trouble with frequent hot flashes. Patient reports she is unable to wear a bra because of the pain it causes. Patient scheduled to follow up with Dr. Donnie Coffin next month. Patient reports her PCP has retired and she needs to find a new one. Hyperpigmentation noted to left chest wall. Encouraged patient to use a lotion with vitamin e and coco butter twice daily to left chest wall. Patient verbalized understanding.

## 2011-03-05 NOTE — Progress Notes (Signed)
Followup note:  Brittany Blair returns today approximately 1 month following completion of radiation therapy to her left chest wall and regional lymph nodes. She still has left chest wall and axilla discomfort for which she takes an occasional Vicodin. Wearing a bra is uncomfortable. She is expecting to get a left breast prosthesis in the near future she starts adjuvant tamoxifen next week. She continues to have periodic hot flashes which have been present since her chemotherapy.  Physical examination: Head and neck examination: She does have regrowth of hair. Nodes: Without palpable cervical, supraclavicular, or axillary lymphadenopathy. There is likely to be nodular scarring along her anterior axilla adjacent to the superior aspect of her mastectomy scar. This area is quite tender to palpation. She's had complete reepithelialization. There is marked hyperpigmentation scanned with patchy dry desquamation. There is no visible or palpable evidence for recurrent disease along the left chest wall. Lungs clear. Right breast without masses or lesions. Abdomen without hepatomegaly. Extremities: Without edema.  Impression: Satisfactory progress, but persistent left chest wall/axilla discomfort, presumably related to her recent radiation therapy. Hopefully, this will improve the near future. Her left axillary nodular scarring will need to be followed closely. She will start her tamoxifen next week. She will see Dr. Donnie Coffin in February. I've not scheduled Brittany Blair for a formal followup visit, but I would be more than happy to see her in the future should the need arise.

## 2011-03-18 ENCOUNTER — Other Ambulatory Visit: Payer: Self-pay | Admitting: *Deleted

## 2011-03-18 MED ORDER — ALPRAZOLAM 0.5 MG PO TABS: 1.0000 mg | ORAL_TABLET | Freq: Three times a day (TID) | ORAL | Status: DC | PRN

## 2011-04-12 ENCOUNTER — Other Ambulatory Visit: Payer: Self-pay | Admitting: Lab

## 2011-04-12 ENCOUNTER — Ambulatory Visit: Payer: Self-pay | Admitting: Physician Assistant

## 2011-04-17 ENCOUNTER — Telehealth: Payer: Self-pay | Admitting: Oncology

## 2011-04-17 NOTE — Telephone Encounter (Signed)
lmonvm advising the pt of her r/s feb 2013 appts

## 2011-04-24 ENCOUNTER — Other Ambulatory Visit: Payer: Self-pay

## 2011-04-24 ENCOUNTER — Ambulatory Visit: Payer: Self-pay | Admitting: Physician Assistant

## 2011-05-17 ENCOUNTER — Other Ambulatory Visit (HOSPITAL_BASED_OUTPATIENT_CLINIC_OR_DEPARTMENT_OTHER): Payer: Self-pay

## 2011-05-17 ENCOUNTER — Telehealth: Payer: Self-pay | Admitting: *Deleted

## 2011-05-17 ENCOUNTER — Ambulatory Visit (HOSPITAL_BASED_OUTPATIENT_CLINIC_OR_DEPARTMENT_OTHER): Payer: Self-pay | Admitting: Physician Assistant

## 2011-05-17 ENCOUNTER — Encounter: Payer: Self-pay | Admitting: Physician Assistant

## 2011-05-17 VITALS — BP 165/119 | HR 114 | Temp 97.8°F | Ht 64.0 in | Wt 152.5 lb

## 2011-05-17 DIAGNOSIS — C50319 Malignant neoplasm of lower-inner quadrant of unspecified female breast: Secondary | ICD-10-CM

## 2011-05-17 DIAGNOSIS — C50919 Malignant neoplasm of unspecified site of unspecified female breast: Secondary | ICD-10-CM

## 2011-05-17 DIAGNOSIS — R232 Flushing: Secondary | ICD-10-CM

## 2011-05-17 DIAGNOSIS — N951 Menopausal and female climacteric states: Secondary | ICD-10-CM

## 2011-05-17 DIAGNOSIS — I890 Lymphedema, not elsewhere classified: Secondary | ICD-10-CM

## 2011-05-17 DIAGNOSIS — D696 Thrombocytopenia, unspecified: Secondary | ICD-10-CM

## 2011-05-17 DIAGNOSIS — F329 Major depressive disorder, single episode, unspecified: Secondary | ICD-10-CM

## 2011-05-17 LAB — COMPREHENSIVE METABOLIC PANEL
ALT: 16 U/L (ref 0–35)
BUN: 13 mg/dL (ref 6–23)
CO2: 26 mEq/L (ref 19–32)
Calcium: 9.6 mg/dL (ref 8.4–10.5)
Chloride: 99 mEq/L (ref 96–112)
Creatinine, Ser: 0.72 mg/dL (ref 0.50–1.10)
Glucose, Bld: 85 mg/dL (ref 70–99)
Total Bilirubin: 0.2 mg/dL — ABNORMAL LOW (ref 0.3–1.2)

## 2011-05-17 LAB — CANCER ANTIGEN 27.29: CA 27.29: 21 U/mL (ref 0–39)

## 2011-05-17 LAB — CBC WITH DIFFERENTIAL/PLATELET
Eosinophils Absolute: 0.1 10*3/uL (ref 0.0–0.5)
HCT: 38.7 % (ref 34.8–46.6)
LYMPH%: 19.1 % (ref 14.0–49.7)
MCV: 104 fL — ABNORMAL HIGH (ref 79.5–101.0)
MONO%: 6.1 % (ref 0.0–14.0)
NEUT#: 4.3 10*3/uL (ref 1.5–6.5)
NEUT%: 73.4 % (ref 38.4–76.8)
Platelets: 268 10*3/uL (ref 145–400)
RBC: 3.72 10*6/uL (ref 3.70–5.45)
nRBC: 0 % (ref 0–0)

## 2011-05-17 LAB — LACTATE DEHYDROGENASE: LDH: 153 U/L (ref 94–250)

## 2011-05-17 MED ORDER — VENLAFAXINE HCL ER 37.5 MG PO CP24: 37.5000 mg | ORAL_CAPSULE | Freq: Every day | ORAL | Status: DC

## 2011-05-17 NOTE — Telephone Encounter (Signed)
gave patient appointment for 06-07-2011 at 3:15pm printed out calendar and gave to the patient

## 2011-05-17 NOTE — Progress Notes (Signed)
Hematology and Oncology Follow Up Visit  Brittany Blair [id number] [birth year] 51 y.o. 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 2. S/p left modified radical mastectomy with axillary lymph node dissection on 10/10/2010 revealing residual ypT2, ypN2 disease. Radiation completed Xeloda as chemosensitization on 02/01/2011. Of note, Xeloda was held for last several treatments due to hypersensitivity. Patient initiated tamoxifen 20 mg by mouth daily on 02/05/2011. 3. History of severe hypertension on Norvasc 20 mg per day. 4  History of anxiety and depression .   Interim History:   Brittany Blair is seen today with her husband in accompaniment for follow pertaining to her history of a locally advanced ER/PR positive, HER-2 negative breast carcinoma. She is a bit tearful today, and admits that she has not initiate her tamoxifen as prescribed back in December of 2012 due to the fact that she is having significant hot flashes, so much so that they are keeping her awake at night. She also ran out of her Prozac, Xanax, and Norco recently, she actually wonders about changing her Prozac anyhow do something more effective possibly Effexor XR which she is heard may help with the depression and hot flashes. She is also having trouble with some mild lymphedema of the left upper extremity which has then. She is utilizing her narcotic he is comfortable. She wonders about a referral to physical therapy, possibly through [facility]. Otherwise, she denies any fevers, chills, shortness of breath, or frank chest pain. Her appetite has been excellent without nausea, emesis, diarrhea, or constipation.   A detailed review of systems is otherwise  noncontributory as noted below.  Review of Systems: Constitutional:  feels well and night sweats Eyes: uses glasses ENT: no complaints Cardiovascular: no chest pain or dyspnea on exertion Respiratory: no cough, shortness of breath, or wheezing Neurological: no TIA or stroke symptoms Dermatological: negative Gastrointestinal: no abdominal pain, change in bowel habits, or black or bloody stools Genito-Urinary: no dysuria, trouble voiding, or hematuria Hematological and Lymphatic: negative Breast: negative Musculoskeletal: positive for - pain in arm - left, secondary to lymphedema Remaining ROS negative.   Medications:   I have reviewed the patient's current medications.  Current Outpatient Prescriptions  Medication Sig Dispense Refill  . acyclovir (ZOVIRAX) 400 MG tablet Take 400 mg by mouth daily.       Marland Kitchen HYDROcodone-acetaminophen (NORCO) 5-325 MG per tablet Take 2 tablets by mouth every 6 (six) hours as needed.  100 tablet  1  . lisinopril (PRINIVIL,ZESTRIL) 20 MG tablet Take 1 tablet (20 mg total) by mouth daily.  30 tablet  3  . loratadine (CLARITIN) 10 MG tablet Take 10 mg by mouth daily.        Marland Kitchen ALPRAZolam (XANAX) 0.5 MG tablet Take 2 tablets (1 mg total) by mouth 3 (three) times daily as needed.  90 tablet  0  . FLUoxetine (PROZAC) 20 MG tablet Take 40 mg by mouth daily.          Allergies:  Allergies  Allergen Reactions  . Bee     Physical Exam: Filed Vitals:   05/17/11 1513  BP: 165/119  Pulse: 114  Temp: 97.8 F (36.6 C)    Body mass index is 26.18 kg/(m^2). Weight: 152  HEENT:  Sclerae anicteric, conjunctivae pink.  Oropharynx clear.  No mucositis or candidiasis.   Nodes:  No cervical, supraclavicular, or axillary lymphadenopathy palpated.  Breast Exam:  Left breast is surgically absent with a well-healed mastectomy scar, the skin actually looks quite good with some residual hyperpigmentation from radiation therapy but no evidence of skin breakdown. The axilla  is clear. The right breast is free of masses skin changes nipple inversion or discharge, axilla clear. Lungs:  Clear to auscultation bilaterally.  No crackles, rhonchi, or wheezes.   Heart:  Regular rate and rhythm.   Abdomen:  Soft, nontender.  Positive bowel sounds.  No organomegaly or masses palpated.   Musculoskeletal:  No focal spinal tenderness to palpation.  Extremities: Mild left upper extremity lymphedema changes without erythema or palpable cords.   Skin:  Benign.   Neuro:  Nonfocal, alert and oriented x 3.   Lab Results: Lab Results  Component Value Date   WBC 5.9 05/17/2011   HGB 13.1 05/17/2011   HCT 38.7 05/17/2011   MCV 104.0* 05/17/2011   PLT 268 05/17/2011   NEUTROABS 4.3 05/17/2011     Chemistry      Component Value Date/Time   NA 135 02/04/2011 1527   K 4.7 02/04/2011 1527   CL 97 02/04/2011 1527   CO2 27 02/04/2011 1527   BUN 18 02/04/2011 1527   CREATININE 0.66 02/04/2011 1527      Component Value Date/Time   CALCIUM 10.1 02/04/2011 1527   ALKPHOS 68 02/04/2011 1527   AST 17 02/04/2011 1527   ALT 14 02/04/2011 1527   BILITOT 0.6 02/04/2011 1527      Lab Results  Component Value Date   LABCA2 37 06/06/2010    Assessment:  Brittany Blair is a 51 year old [place] woman with a history of a locally advanced ER/PR positive, 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 2. S/p left modified radical mastectomy with axillary lymph node dissection on 10/10/2010 revealing residual ypT2, ypN2 disease. Radiation completed Xeloda as chemosensitization on 02/01/2011. Of note, Xeloda was held for last several treatments due to hypersensitivity. Patient initiated tamoxifen 20 mg by mouth daily on 02/05/2011. 3. History of severe hypertension on Norvasc 20 mg per day. 4  History of anxiety and depression .  5.  Mild lymphedema of left upper extremity.  Case reviewed with Dr. Pierce Crane.   Plan:  Brittany Blair will initiate Effexor XR 37.5 mg to be taken 1 by mouth daily to increase to 2 per day in one week's time. Refill for Xanax 0.5 mg tablets along with Norco 5/325 was also provided. She will hold off initiating tamoxifen for the time being until we get her hot flashes under better control. We will also put a call into physical therapy department at [facility] [phone]) about lymphedema referral.  I will see her back on 06/07/2011 for followup to discuss the possibility of initiating tamoxifen therapy at that time. She knows to contact us sooner if the need should arise.   This plan was reviewed with the patient, who voices understanding and agreement.  She knows to call with any changes or problems.    Aliceson Dolbow T, PA-C 05/17/2011

## 2011-06-06 ENCOUNTER — Ambulatory Visit (HOSPITAL_COMMUNITY): Payer: Self-pay | Admitting: Physical Therapy

## 2011-06-07 ENCOUNTER — Emergency Department (HOSPITAL_COMMUNITY): Payer: Self-pay

## 2011-06-07 ENCOUNTER — Ambulatory Visit (HOSPITAL_BASED_OUTPATIENT_CLINIC_OR_DEPARTMENT_OTHER): Payer: Self-pay | Admitting: Physician Assistant

## 2011-06-07 ENCOUNTER — Emergency Department (HOSPITAL_COMMUNITY)
Admission: EM | Admit: 2011-06-07 | Discharge: 2011-06-07 | Disposition: A | Discharge status: Home / Self Care | Payer: Self-pay | Attending: Emergency Medicine | Admitting: Emergency Medicine

## 2011-06-07 ENCOUNTER — Encounter (HOSPITAL_COMMUNITY): Payer: Self-pay | Admitting: Emergency Medicine

## 2011-06-07 ENCOUNTER — Telehealth: Payer: Self-pay | Admitting: *Deleted

## 2011-06-07 VITALS — BP 138/98 | HR 106 | Temp 98.6°F

## 2011-06-07 DIAGNOSIS — C50319 Malignant neoplasm of lower-inner quadrant of unspecified female breast: Secondary | ICD-10-CM

## 2011-06-07 DIAGNOSIS — M25559 Pain in unspecified hip: Secondary | ICD-10-CM

## 2011-06-07 DIAGNOSIS — Y92009 Unspecified place in unspecified non-institutional (private) residence as the place of occurrence of the external cause: Secondary | ICD-10-CM | POA: Insufficient documentation

## 2011-06-07 DIAGNOSIS — Z79899 Other long term (current) drug therapy: Secondary | ICD-10-CM | POA: Insufficient documentation

## 2011-06-07 DIAGNOSIS — C50119 Malignant neoplasm of central portion of unspecified female breast: Secondary | ICD-10-CM

## 2011-06-07 DIAGNOSIS — C773 Secondary and unspecified malignant neoplasm of axilla and upper limb lymph nodes: Secondary | ICD-10-CM

## 2011-06-07 DIAGNOSIS — M545 Low back pain, unspecified: Secondary | ICD-10-CM | POA: Insufficient documentation

## 2011-06-07 DIAGNOSIS — M549 Dorsalgia, unspecified: Secondary | ICD-10-CM

## 2011-06-07 DIAGNOSIS — M62838 Other muscle spasm: Secondary | ICD-10-CM | POA: Insufficient documentation

## 2011-06-07 DIAGNOSIS — W108XXA Fall (on) (from) other stairs and steps, initial encounter: Secondary | ICD-10-CM | POA: Insufficient documentation

## 2011-06-07 DIAGNOSIS — Z853 Personal history of malignant neoplasm of breast: Secondary | ICD-10-CM | POA: Insufficient documentation

## 2011-06-07 DIAGNOSIS — I10 Essential (primary) hypertension: Secondary | ICD-10-CM | POA: Insufficient documentation

## 2011-06-07 DIAGNOSIS — Z170 Estrogen receptor positive status [ER+]: Secondary | ICD-10-CM

## 2011-06-07 LAB — URINALYSIS, ROUTINE W REFLEX MICROSCOPIC
Bilirubin Urine: NEGATIVE
Ketones, ur: NEGATIVE mg/dL
Nitrite: NEGATIVE
Protein, ur: NEGATIVE mg/dL

## 2011-06-07 MED ORDER — HYDROMORPHONE HCL PF 1 MG/ML IJ SOLN
1.0000 mg | Freq: Once | INTRAMUSCULAR | Status: AC
  Administered 2011-06-07: 1 mg via INTRAVENOUS
  Filled 2011-06-07: qty 1

## 2011-06-07 MED ORDER — DIAZEPAM 5 MG/ML IJ SOLN
5.0000 mg | Freq: Once | INTRAMUSCULAR | Status: AC
  Administered 2011-06-07: 5 mg via INTRAVENOUS
  Filled 2011-06-07: qty 2

## 2011-06-07 MED ORDER — OXYCODONE-ACETAMINOPHEN 5-325 MG PO TABS: 1.0000 | ORAL_TABLET | Freq: Four times a day (QID) | ORAL | Status: AC | PRN

## 2011-06-07 MED ORDER — DIAZEPAM 5 MG PO TABS: 5.0000 mg | ORAL_TABLET | Freq: Four times a day (QID) | ORAL | Status: AC | PRN

## 2011-06-07 NOTE — ED Notes (Signed)
Pt fell about a week ago.  Was sent by Dr. Willa Rough for the lower left back pain she is having.  Pt is having difficulty walking d/t decreased weight bearing to LT leg.

## 2011-06-07 NOTE — ED Provider Notes (Signed)
History     CSN: [id number]  Arrival date & time 06/07/11  1603   First MD Initiated Contact with Patient 06/07/11 2005      Chief Complaint  Patient presents with  . Back Pain  . Fall    HPI  History provided by the patient and spouse. Patient is 51 year old female with history of breast cancer, hypertension who presents with complaints of increasing left lower back and hip pains after a fall 8 days ago. Patient reports missing a step coming into her kitchen at home and falling onto her left side hitting a wood step. Pain is worse with certain movements and with standing and walking. Patient reports having reduced range of motion and activity each day. Patient has been using home pain medications regularly without significant improvement. She is taking Vicodin 5 mg. Today patient had appointment with her oncologist who is concerned for more severe injury and recommended she come to emergency room for evaluation. Pain occasionally radiates down the left lower extremity. Patient denies any numbness, tingling or paralysis. She denies any urinary or fecal incontinence, urinary retention or perineal numbness.    Past Medical History  Diagnosis Date  . Anxiety   . Herpes   . MRSA (methicillin resistant Staphylococcus aureus)   . Breast lump     left  . Cancer     breast  . Hypertension   . Hematoma   . Swelling     left foot  . Multiple blisters     along surgical site    Past Surgical History  Procedure Date  . Tubal ligation   . Wisdom tooth extraction   . Mastectomy modified radical 10/10/10     left -Dr Jamey Ripa    Family History  Problem Relation Age of Onset  . Heart disease Father     heart attack    History  Substance Use Topics  . Smoking status: Current Everyday Smoker -- 0.2 packs/day  . Smokeless tobacco: Not on file  . Alcohol Use: 12.6 oz/week    21 Glasses of wine per week    OB History    Grav Para Term Preterm Abortions TAB SAB Ect Mult Living                Review of Systems  Constitutional: Negative for fever.  Respiratory: Negative for shortness of breath.   Cardiovascular: Negative for chest pain and palpitations.  Genitourinary: Negative for dysuria, frequency, hematuria and flank pain.  Musculoskeletal: Positive for back pain.  Neurological: Negative for weakness and numbness.    Allergies  Bee  Home Medications   Current Outpatient Rx  Name Route Sig Dispense Refill  . ACYCLOVIR 400 MG PO TABS Oral Take 400 mg by mouth daily.     Marland Kitchen ALPRAZOLAM 0.5 MG PO TABS Oral Take 2 tablets (1 mg total) by mouth 3 (three) times daily as needed. 90 tablet 0  . HYDROCODONE-ACETAMINOPHEN 5-325 MG PO TABS Oral Take 2 tablets by mouth every 6 (six) hours as needed. pain    . LISINOPRIL 20 MG PO TABS Oral Take 1 tablet (20 mg total) by mouth daily. 30 tablet 3  . LORATADINE 10 MG PO TABS Oral Take 10 mg by mouth daily.      . VENLAFAXINE HCL ER 37.5 MG PO CP24 Oral Take 1 capsule (37.5 mg total) by mouth daily. 60 capsule 3    BP 142/100  Pulse 91  Temp(Src) 98.1 F (36.7 C) (Oral)  Resp 15  SpO2 100%  Physical Exam  Nursing note and vitals reviewed. Constitutional: She is oriented to person, place, and time. She appears well-developed and well-nourished. No distress.  HENT:  Head: Normocephalic and atraumatic.       No battle sign or raccoon eyes  Eyes: Conjunctivae and EOM are normal. Pupils are equal, round, and reactive to light.  Neck: Normal range of motion. Neck supple.  Cardiovascular: Normal rate and regular rhythm.   Pulmonary/Chest: Effort normal and breath sounds normal. No respiratory distress. She has no wheezes. She has no rales.  Abdominal: Soft. She exhibits no distension. There is no tenderness. There is no rebound and no guarding.  Musculoskeletal: She exhibits no edema and no tenderness.       Cervical back: Normal.       Thoracic back: Normal.       Lumbar back: She exhibits tenderness.        Back:       Pain with range of motion in the low back secondary to pain.  Neurological: She is alert and oriented to person, place, and time. She has normal strength. No sensory deficit.  Skin: Skin is warm and dry. No rash noted.  Psychiatric: She has a normal mood and affect. Her behavior is normal.    ED Course  Procedures   Results for orders placed during the hospital encounter of 06/07/11  URINALYSIS, ROUTINE W REFLEX MICROSCOPIC      Component Value Range   Color, Urine YELLOW  YELLOW    APPearance CLOUDY (*) CLEAR    Specific Gravity, Urine 1.010  1.005 - 1.030    pH 6.0  5.0 - 8.0    Glucose, UA NEGATIVE  NEGATIVE (mg/dL)   Hgb urine dipstick NEGATIVE  NEGATIVE    Bilirubin Urine NEGATIVE  NEGATIVE    Ketones, ur NEGATIVE  NEGATIVE (mg/dL)   Protein, ur NEGATIVE  NEGATIVE (mg/dL)   Urobilinogen, UA 0.2  0.0 - 1.0 (mg/dL)   Nitrite NEGATIVE  NEGATIVE    Leukocytes, UA MODERATE (*) NEGATIVE   URINE MICROSCOPIC-ADD ON      Component Value Range   Squamous Epithelial / LPF RARE  RARE    WBC, UA 3-6  <3 (WBC/hpf)   RBC / HPF 0-2  <3 (RBC/hpf)   Bacteria, UA RARE  RARE       Dg Lumbar Spine Complete  06/07/2011  *RADIOLOGY REPORT*  Clinical Data: Fall with low back pain. History of breast cancer.  LUMBAR SPINE - COMPLETE 4+ VIEW  Comparison: None  Findings: Five non-rib bearing lumbar type vertebra are identified. There is no evidence of acute fracture or subluxation. The disc spaces are maintained. No focal bony lesions or spondylolysis noted.  IMPRESSION: No acute or significant abnormalities identified.  Original Report Authenticated By: Rosendo Gros, M.D.   Dg Hip Complete Left  06/07/2011  *RADIOLOGY REPORT*  Clinical Data: Fall with left hip pain.  LEFT HIP - COMPLETE 2+ VIEW  Comparison: None  Findings: No evidence of acute fracture, subluxation or dislocation identified.  No radio-opaque foreign bodies are present.  No focal bony lesions are noted.  The joint  spaces are unremarkable.  IMPRESSION: Unremarkable exam.  Original Report Authenticated By: Rosendo Gros, M.D.     1. Muscle spasm   2. Back pain       MDM  8:05 PM patient seen and evaluated. Patient no acute distress.   Patient feeling much better after pain medications now. Able  to move much better. Patient without urinary symptoms. UA is likely contaminant. Patient agrees with plan to try any medications to treat symptoms including muscle relaxants. Patient will followup with PCP next week.       Angus Seller, [place] 06/07/11 2253

## 2011-06-07 NOTE — Progress Notes (Signed)
Hematology and Oncology Follow Up Visit  Brittany Blair [id number] Feb 12, 1961 51 y.o. 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 2. S/p left modified radical mastectomy with axillary lymph node dissection on 10/10/2010 revealing residual ypT2, ypN2 disease. Radiation completed Xeloda as chemosensitization on 02/01/2011. Of note, Xeloda was held for last several treatments due to hypersensitivity.  3. History of severe hypertension on Norvasc 20 mg per day. 4  History of anxiety and depression .  5. Hot flashes, initiated Effexor XR 37.5mg  po daily on 05/17/11.  Interim History:   Brittany Blair is seen today to assess tolerance of Effexor XR, initiated on 05/17/11 due to significant issues with hot flashes and depression.  From the hot flash and depression standpoint she is doing great, but about 1 week ago, she sustained a fall at home, landing on her L posterior hip region after falling down stairs.  This region has remained quite painful despite use of ice, rest, and eventual use of Vicodin.  The region is painful to touch, yet no bruising has occurred.  No difficulty with urination, no hematuria, no bowel issues. A detailed review of systems is otherwise noncontributory as noted below.  Review of Systems: Constitutional:  feels well and night sweats Eyes: uses glasses ENT: no complaints Cardiovascular: no chest pain or dyspnea on exertion Respiratory: no cough, shortness of breath, or wheezing Neurological: no TIA or stroke symptoms Dermatological: negative Gastrointestinal: no abdominal pain, change in bowel habits, or black or bloody stools Genito-Urinary: no dysuria, trouble voiding, or hematuria Hematological  and Lymphatic: negative Breast: negative Musculoskeletal: positive for - pain in arm - left, secondary to lymphedema, L posterior hip region. Remaining ROS negative.   Medications:   I have reviewed the patient's current medications.  Current Outpatient Prescriptions  Medication Sig Dispense Refill  . acyclovir (ZOVIRAX) 400 MG tablet Take 400 mg by mouth daily.       Marland Kitchen ALPRAZolam (XANAX) 0.5 MG tablet Take 2 tablets (1 mg total) by mouth 3 (three) times daily as needed.  90 tablet  0  . lisinopril (PRINIVIL,ZESTRIL) 20 MG tablet Take 1 tablet (20 mg total) by mouth daily.  30 tablet  3  . loratadine (CLARITIN) 10 MG tablet Take 10 mg by mouth daily.        Marland Kitchen venlafaxine (EFFEXOR-XR) 37.5 MG 24 hr capsule Take 1 capsule (37.5 mg total) by mouth daily.  60 capsule  3  . HYDROcodone-acetaminophen (NORCO) 5-325 MG per tablet Take 2 tablets by mouth every 6 (six) hours as needed. pain      . DISCONTD: FLUoxetine (PROZAC) 20 MG tablet Take 40 mg by mouth daily.          Allergies:  Allergies  Allergen Reactions  . Bee     Physical Exam: Filed Vitals:   06/07/11 1519  BP: 138/98  Pulse: 106  Temp: 98.6 F (37 C)    There is no height or weight on file to calculate BMI. Weight: 152 lbs. Full exam deferred. Region just above the L posterior hip region examined.  Slight redness, but no evidence of ecchymosis.  No deformity. Tender to palpation.  Lab Results: Lab Results  Component Value Date   WBC 5.9 05/17/2011  HGB 13.1 05/17/2011   HCT 38.7 05/17/2011   MCV 104.0* 05/17/2011   PLT 268 05/17/2011   NEUTROABS 4.3 05/17/2011     Chemistry      Component Value Date/Time   NA 136 05/17/2011 1448   K 4.0 05/17/2011 1448   CL 99 05/17/2011 1448   CO2 26 05/17/2011 1448   BUN 13 05/17/2011 1448   CREATININE 0.72 05/17/2011 1448      Component Value Date/Time   CALCIUM 9.6 05/17/2011 1448   ALKPHOS 83 05/17/2011 1448   AST 24 05/17/2011 1448   ALT 16 05/17/2011 1448   BILITOT 0.2*  05/17/2011 1448      Lab Results  Component Value Date   LABCA2 21 05/17/2011    Assessment:  Brittany Blair is a 51 year old [place] woman with a history of a locally advanced ER/PR positive, 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 2. S/p left modified radical mastectomy with axillary lymph node dissection on 10/10/2010 revealing residual ypT2, ypN2 disease. Radiation completed Xeloda as chemosensitization on 02/01/2011. Of note, Xeloda was held for last several treatments due to hypersensitivity.  3. History of severe hypertension on Norvasc 20 mg per day. 4  History of anxiety and depression .  5. Mild lymphedema of left upper extremity. 6. Fall onto L posterior hip region over 1 week ago, with persistant pain.  Case reviewed with Dr. Pierce Crane.   Plan:  Brittany Blair will continue on Effexor XR 37.5mg  po daily. She will be referred to the ED for assessment pertaining to persistent pain over the L posterior hip region.  We have recommended she initiate Tamoxifen 20mg  daily as previously prescribed. She is officially scheduled for followup in one month's time, though sooner if the need should arise.  This plan was reviewed with the patient, who voices understanding and agreement.  She knows to call with any changes or problems.    Brittany Blair T, PA-C 06/07/2011

## 2011-06-07 NOTE — ED Notes (Signed)
Per Pt: fell one week ago while walking on stairs and hit lower left back on stairs. MD Clide Dales sent patient to ED due to pain getting worse over past week. Pt has recently finished breast cancer sx, chemo, and radiation. Has some lymphedema and vision changes since treatment. Pt reports increased use in narcotic medications to control pain.

## 2011-06-07 NOTE — Discharge Instructions (Signed)
Your x-rays today do not show any signs for broken bones or dislocations to cause your symptoms. At this time your providers feel your symptoms are caused her muscle strain and soreness and spasm. You may given new prescriptions to help treat your symptoms. Please contact your doctor next week for further evaluation and treatment. Return to emergency room for any worsening symptoms.   Back Pain, Adult Low back pain is very common. About 1 in 5 people have back pain.The cause of low back pain is rarely dangerous. The pain often gets better over time.About half of people with a sudden onset of back pain feel better in just 2 weeks. About 8 in 10 people feel better by 6 weeks.  CAUSES Some common causes of back pain include:  Strain of the muscles or ligaments supporting the spine.   Wear and tear (degeneration) of the spinal discs.   Arthritis.   Direct injury to the back.  DIAGNOSIS Most of the time, the direct cause of low back pain is not known.However, back pain can be treated effectively even when the exact cause of the pain is unknown.Answering your caregiver's questions about your overall health and symptoms is one of the most accurate ways to make sure the cause of your pain is not dangerous. If your caregiver needs more information, he or she may order lab work or imaging tests (X-rays or MRIs).However, even if imaging tests show changes in your back, this usually does not require surgery. HOME CARE INSTRUCTIONS For many people, back pain returns.Since low back pain is rarely dangerous, it is often a condition that people can learn to [facility] their own.   Remain active. It is stressful on the back to sit or stand in one place. Do not sit, drive, or stand in one place for more than 30 minutes at a time. Take short walks on level surfaces as soon as pain allows.Try to increase the length of time you walk each day.   Do not stay in bed.Resting more than 1 or 2 days can delay your  recovery.   Do not avoid exercise or work.Your body is made to move.It is not dangerous to be active, even though your back may hurt.Your back will likely heal faster if you return to being active before your pain is gone.   Pay attention to your body when you bend and lift. Many people have less discomfortwhen lifting if they bend their knees, keep the load close to their bodies,and avoid twisting. Often, the most comfortable positions are those that put less stress on your recovering back.   Find a comfortable position to sleep. Use a firm mattress and lie on your side with your knees slightly bent. If you lie on your back, put a pillow under your knees.   Only take over-the-counter or prescription medicines as directed by your caregiver. Over-the-counter medicines to reduce pain and inflammation are often the most helpful.Your caregiver may prescribe muscle relaxant drugs.These medicines help dull your pain so you can more quickly return to your normal activities and healthy exercise.   Put ice on the injured area.   Put ice in a plastic bag.   Place a towel between your skin and the bag.   Leave the ice on for 15 to 20 minutes, 3 to 4 times a day for the first 2 to 3 days. After that, ice and heat may be alternated to reduce pain and spasms.   Ask your caregiver about trying back exercises  and gentle massage. This may be of some benefit.   Avoid feeling anxious or stressed.Stress increases muscle tension and can worsen back pain.It is important to recognize when you are anxious or stressed and learn ways to manage it.Exercise is a great option.  SEEK MEDICAL CARE IF:  You have pain that is not relieved with rest or medicine.   You have pain that does not improve in 1 week.   You have new symptoms.   You are generally not feeling well.  SEEK IMMEDIATE MEDICAL CARE IF:   You have pain that radiates from your back into your legs.   You develop new bowel or bladder  control problems.   You have unusual weakness or numbness in your arms or legs.   You develop nausea or vomiting.   You develop abdominal pain.   You feel faint.  Document Released: 02/11/2005 Document Revised: 01/31/2011 Document Reviewed: 07/02/2010 [facility] Patient Information 2012 [place], [place].    Muscle Cramps Muscle cramps are due to sudden involuntary muscle contraction. This means you have no control over the tightening of a muscle (or muscles). Often there are no obvious causes. Muscle cramps may occur with overexertion. They may also occur with chilling of the muscles. An example of a muscle chilling activity is swimming. It is uncommon for cramps to be due to a serious underlying disorder. In most cases, muscle cramps improve (or leave) within minutes. CAUSES  Some common causes are:  Injury.   Infections, especially viral.   Abnormal levels of the salts and ions in your blood (electrolytes). This could happen if you are taking water pills (diuretics).   Blood vessel disease where not enough blood is getting to the muscles (intermittent claudication).  Some uncommon causes are:  Side effects of some medicine (such as lithium).   Alcohol abuse.   Diseases where there is soreness (inflammation) of the muscular system.  HOME CARE INSTRUCTIONS   It may be helpful to massage, stretch, and relax the affected muscle.   Taking a dose of over-the-counter diphenhydramine is helpful for night leg cramps.  SEEK MEDICAL CARE IF:  Cramps are frequent and not relieved with medicine. MAKE SURE YOU:   Understand these instructions.   Will watch your condition.   Will get help right away if you are not doing well or get worse.  Document Released: 08/03/2001 Document Revised: 01/31/2011 Document Reviewed: 02/03/2008 [facility] Patient Information 2012 [place], [place].

## 2011-06-07 NOTE — Telephone Encounter (Signed)
gave patient appointment for 06-28-2011 printed out calendar and gave to the patient 

## 2011-06-09 NOTE — ED Provider Notes (Signed)
Medical screening examination/treatment/procedure(s) were performed by non-physician practitioner and as supervising physician I was immediately available for consultation/collaboration.   Deeann Servidio E Olanda Downie, MD 06/09/11 1535 

## 2011-06-15 ENCOUNTER — Other Ambulatory Visit: Payer: Self-pay | Admitting: Internal Medicine

## 2011-06-26 ENCOUNTER — Other Ambulatory Visit: Payer: Self-pay | Admitting: Lab

## 2011-06-26 ENCOUNTER — Ambulatory Visit: Payer: Self-pay | Admitting: Physician Assistant

## 2011-06-27 ENCOUNTER — Encounter: Payer: Self-pay | Admitting: Physician Assistant

## 2011-06-27 ENCOUNTER — Other Ambulatory Visit (HOSPITAL_BASED_OUTPATIENT_CLINIC_OR_DEPARTMENT_OTHER): Payer: Self-pay | Admitting: Lab

## 2011-06-27 ENCOUNTER — Ambulatory Visit (HOSPITAL_BASED_OUTPATIENT_CLINIC_OR_DEPARTMENT_OTHER): Payer: Self-pay | Admitting: Physician Assistant

## 2011-06-27 ENCOUNTER — Telehealth: Payer: Self-pay | Admitting: *Deleted

## 2011-06-27 VITALS — BP 158/110 | HR 106 | Temp 98.7°F | Ht 64.0 in | Wt 157.3 lb

## 2011-06-27 DIAGNOSIS — M545 Low back pain, unspecified: Secondary | ICD-10-CM

## 2011-06-27 DIAGNOSIS — C50319 Malignant neoplasm of lower-inner quadrant of unspecified female breast: Secondary | ICD-10-CM

## 2011-06-27 DIAGNOSIS — M25559 Pain in unspecified hip: Secondary | ICD-10-CM

## 2011-06-27 DIAGNOSIS — Z170 Estrogen receptor positive status [ER+]: Secondary | ICD-10-CM

## 2011-06-27 DIAGNOSIS — N959 Unspecified menopausal and perimenopausal disorder: Secondary | ICD-10-CM

## 2011-06-27 LAB — COMPREHENSIVE METABOLIC PANEL
ALT: 15 U/L (ref 0–35)
AST: 24 U/L (ref 0–37)
Albumin: 4.3 g/dL (ref 3.5–5.2)
Alkaline Phosphatase: 79 U/L (ref 39–117)
Glucose, Bld: 98 mg/dL (ref 70–99)
Potassium: 3.9 mEq/L (ref 3.5–5.3)
Sodium: 134 mEq/L — ABNORMAL LOW (ref 135–145)
Total Bilirubin: 0.6 mg/dL (ref 0.3–1.2)
Total Protein: 7.6 g/dL (ref 6.0–8.3)

## 2011-06-27 LAB — CBC WITH DIFFERENTIAL/PLATELET
BASO%: 0.4 % (ref 0.0–2.0)
EOS%: 4.2 % (ref 0.0–7.0)
HGB: 13 g/dL (ref 11.6–15.9)
MCH: 35.3 pg — ABNORMAL HIGH (ref 25.1–34.0)
MCV: 104 fL — ABNORMAL HIGH (ref 79.5–101.0)
MONO%: 12.2 % (ref 0.0–14.0)
RBC: 3.68 10*6/uL — ABNORMAL LOW (ref 3.70–5.45)
RDW: 13.6 % (ref 11.2–14.5)
lymph#: 1 10*3/uL (ref 0.9–3.3)
nRBC: 0 % (ref 0–0)

## 2011-06-27 LAB — CANCER ANTIGEN 27.29: CA 27.29: 26 U/mL (ref 0–39)

## 2011-06-27 NOTE — Telephone Encounter (Signed)
gave patient appointment for mri of the lumbar spine for tuesday arrival time 6:45[m arrival printed out calendar and gave to the patient

## 2011-06-27 NOTE — Progress Notes (Signed)
Hematology and Oncology Follow Up Visit  Brittany Blair [id number] December 15, 1960 51 y.o. 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 2. S/p left modified radical mastectomy with axillary lymph node dissection on 10/10/2010 revealing residual ypT2, ypN2 disease. Radiation completed Xeloda as chemosensitization on 02/01/2011. Of note, Xeloda was held for last several treatments due to hypersensitivity.  3. History of severe hypertension on Norvasc 20 mg per day. 4  History of anxiety and depression .  5. Mild lymphedema of left upper extremity. 6. Fall onto L posterior hip region over 5 week ago, with persistant pain, ED visit plain films show no evidence of fracture.  Interim History:   Brittany Blair is seen today to assess tolerance of Effexor XR, initiated on 05/17/11 due to significant issues with hot flashes and depression.  Of note, her low back region over the left hip is starting to improve albeit extremely slowly. She is walking quite guarded. The ED note from 06/07/2011 was reviewed, there was no evidence of fracture on plain films. She admits that she has not stayed on tamoxifen though she did started, she had significant hot flashes flares, she had only been on Effexor XR  at 37.5 mg due to the fact that she try to increase it to 75 mg she felt woozy. She has since resumed the Effexor at 75 mg, and states that she will restart her tamoxifen in one week's time. A detailed review of systems is otherwise noncontributory as noted below.  Review of Systems: Constitutional:  feels well and night sweats Eyes: uses glasses ENT: no complaints Cardiovascular: no chest pain or dyspnea on exertion Respiratory: no cough,  shortness of breath, or wheezing Neurological: no TIA or stroke symptoms Dermatological: negative Gastrointestinal: no abdominal pain, change in bowel habits, or black or bloody stools Genito-Urinary: no dysuria, trouble voiding, or hematuria Hematological and Lymphatic: negative Breast: negative Musculoskeletal: positive for - pain in arm - left, secondary to lymphedema, L posterior hip region. Remaining ROS negative.   Medications:   I have reviewed the patient's current medications.  Current Outpatient Prescriptions  Medication Sig Dispense Refill  . acyclovir (ZOVIRAX) 400 MG tablet Take 400 mg by mouth daily.       Marland Kitchen ALPRAZolam (XANAX) 0.5 MG tablet Take 2 tablets (1 mg total) by mouth 3 (three) times daily as needed.  90 tablet  0  . lisinopril (PRINIVIL,ZESTRIL) 20 MG tablet Take 1 tablet (20 mg total) by mouth daily.  30 tablet  3  . loratadine (CLARITIN) 10 MG tablet Take 10 mg by mouth daily.        . tamoxifen (NOLVADEX) 20 MG tablet Take 20 mg by mouth daily.      Marland Kitchen venlafaxine (EFFEXOR-XR) 37.5 MG 24 hr capsule Take 1 capsule (37.5 mg total) by mouth daily.  60 capsule  3  . DISCONTD: FLUoxetine (PROZAC) 20 MG tablet Take 40 mg by mouth daily.          Allergies:  Allergies  Allergen Reactions  . Nutritional Supplements     Physical Exam: Filed Vitals:   06/27/11 1511  BP: 158/110  Pulse: 106  Temp: 98.7 F (37.1 C)    Body mass index is 27.00 kg/(m^2). Weight: 152  lbs.  Full exam deferred.   Lab Results: Lab Results  Component Value Date   WBC 6.1 06/27/2011   HGB 13.0 06/27/2011   HCT 38.2 06/27/2011   MCV 104.0* 06/27/2011   PLT 269 06/27/2011   NEUTROABS 4.1 06/27/2011     Chemistry      Component Value Date/Time   NA 136 05/17/2011 1448   K 4.0 05/17/2011 1448   CL 99 05/17/2011 1448   CO2 26 05/17/2011 1448   BUN 13 05/17/2011 1448   CREATININE 0.72 05/17/2011 1448      Component Value Date/Time   CALCIUM 9.6 05/17/2011 1448   ALKPHOS 83 05/17/2011  1448   AST 24 05/17/2011 1448   ALT 16 05/17/2011 1448   BILITOT 0.2* 05/17/2011 1448      Lab Results  Component Value Date   LABCA2 21 05/17/2011    Assessment:  Brittany Blair is a 51 year old [place] woman with a history of a locally advanced ER/PR positive, 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 2. S/p left modified radical mastectomy with axillary lymph node dissection on 10/10/2010 revealing residual ypT2, ypN2 disease. Radiation completed Xeloda as chemosensitization on 02/01/2011. Of note, Xeloda was held for last several treatments due to hypersensitivity.  3. History of severe hypertension on Norvasc 20 mg per day. 4  History of anxiety and depression .  5. Mild lymphedema of left upper extremity. 6. Fall onto L posterior hip region over 5 week ago, with persistant pain, ED visit plain films show no evidence of fracture.  Case reviewed with Dr. Pierce Crane.   Plan:  Brittany Blair will continue on Effexor XR 75mg  po daily. She will resume tamoxifen 20 mg by mouth daily in about one week. We will also refer her for an MRI of the LS spine region due to the persistence we will officially see her back in 6 weeks for followup though sooner if the need should arise.  This plan was reviewed with the patient, who voices understanding and agreement.  She knows to call with any changes or problems.    Garhett Bernhard T, PA-C 06/27/2011

## 2011-07-02 ENCOUNTER — Ambulatory Visit (HOSPITAL_COMMUNITY): Payer: Self-pay

## 2011-07-02 ENCOUNTER — Ambulatory Visit (HOSPITAL_COMMUNITY)
Admission: RE | Admit: 2011-07-02 | Discharge: 2011-07-02 | Disposition: A | Discharge status: Home / Self Care | Payer: Self-pay | Source: Ambulatory Visit | Attending: Physician Assistant | Admitting: Physician Assistant

## 2011-07-02 DIAGNOSIS — M949 Disorder of cartilage, unspecified: Secondary | ICD-10-CM | POA: Insufficient documentation

## 2011-07-02 DIAGNOSIS — C50319 Malignant neoplasm of lower-inner quadrant of unspecified female breast: Secondary | ICD-10-CM

## 2011-07-02 DIAGNOSIS — M545 Low back pain, unspecified: Secondary | ICD-10-CM

## 2011-07-02 DIAGNOSIS — M899 Disorder of bone, unspecified: Secondary | ICD-10-CM | POA: Insufficient documentation

## 2011-07-02 DIAGNOSIS — W19XXXA Unspecified fall, initial encounter: Secondary | ICD-10-CM | POA: Insufficient documentation

## 2011-07-02 DIAGNOSIS — M5126 Other intervertebral disc displacement, lumbar region: Secondary | ICD-10-CM | POA: Insufficient documentation

## 2011-07-02 DIAGNOSIS — Z853 Personal history of malignant neoplasm of breast: Secondary | ICD-10-CM | POA: Insufficient documentation

## 2011-07-02 DIAGNOSIS — M47817 Spondylosis without myelopathy or radiculopathy, lumbosacral region: Secondary | ICD-10-CM | POA: Insufficient documentation

## 2011-07-02 DIAGNOSIS — R937 Abnormal findings on diagnostic imaging of other parts of musculoskeletal system: Secondary | ICD-10-CM | POA: Insufficient documentation

## 2011-07-02 MED ORDER — GADOBENATE DIMEGLUMINE 529 MG/ML IV SOLN
14.0000 mL | Freq: Once | INTRAVENOUS | Status: AC | PRN
  Administered 2011-07-02: 14 mL via INTRAVENOUS

## 2011-07-04 ENCOUNTER — Telehealth: Payer: Self-pay | Admitting: *Deleted

## 2011-07-04 ENCOUNTER — Other Ambulatory Visit: Payer: Self-pay | Admitting: Physician Assistant

## 2011-07-04 DIAGNOSIS — C50319 Malignant neoplasm of lower-inner quadrant of unspecified female breast: Secondary | ICD-10-CM

## 2011-07-04 NOTE — Telephone Encounter (Signed)
Patient called reporting she was told to call our office for a refill of hydrocodone 5/325mg .  This med not on patient's med list.  Instructed her to call the pharmacy, request the refill and the pharmacy will call, fax or request the refill electronically.  Reports she'll need to drive from [place] to pick up the prescription.  Number given to [facility].

## 2011-07-19 ENCOUNTER — Encounter: Payer: Self-pay | Admitting: *Deleted

## 2011-07-19 NOTE — Progress Notes (Unsigned)
Pt request an excuse note for jury duty. Per MD, has been given

## 2011-08-16 ENCOUNTER — Ambulatory Visit (HOSPITAL_BASED_OUTPATIENT_CLINIC_OR_DEPARTMENT_OTHER): Payer: Self-pay | Admitting: Oncology

## 2011-08-16 VITALS — BP 96/63 | HR 118 | Temp 98.6°F | Ht 64.0 in | Wt 160.0 lb

## 2011-08-16 DIAGNOSIS — R071 Chest pain on breathing: Secondary | ICD-10-CM

## 2011-08-16 DIAGNOSIS — C50119 Malignant neoplasm of central portion of unspecified female breast: Secondary | ICD-10-CM

## 2011-08-16 DIAGNOSIS — F329 Major depressive disorder, single episode, unspecified: Secondary | ICD-10-CM

## 2011-08-16 DIAGNOSIS — C50319 Malignant neoplasm of lower-inner quadrant of unspecified female breast: Secondary | ICD-10-CM

## 2011-08-16 DIAGNOSIS — C773 Secondary and unspecified malignant neoplasm of axilla and upper limb lymph nodes: Secondary | ICD-10-CM

## 2011-08-16 MED ORDER — ANASTROZOLE 1 MG PO TABS: 1.0000 mg | ORAL_TABLET | Freq: Every day | ORAL | Status: DC

## 2011-08-16 MED ORDER — VENLAFAXINE HCL ER 75 MG PO CP24: 75.0000 mg | ORAL_CAPSULE | Freq: Every day | ORAL | Status: DC

## 2011-08-16 MED ORDER — HYDROCODONE-ACETAMINOPHEN 5-325 MG PO TABS: 1.0000 | ORAL_TABLET | Freq: Four times a day (QID) | ORAL | Status: DC | PRN

## 2011-08-16 MED ORDER — ALPRAZOLAM 0.5 MG PO TABS: 0.5000 mg | ORAL_TABLET | Freq: Three times a day (TID) | ORAL | Status: DC | PRN

## 2011-08-16 NOTE — Patient Instructions (Signed)
Mammogram and bone density next week Begin zoladex-injections to shut down ovaries Following month begin pill to further decrease estrogen production ( arimidex)

## 2011-08-16 NOTE — Progress Notes (Signed)
Hematology and Oncology Follow Up Visit  Brittany Blair [id number] May 12, 1960 51 y.o. 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 2. S/p left modified radical mastectomy with axillary lymph node dissection on 10/10/2010 revealing residual ypT2, ypN2 disease. Radiation completed Xeloda as chemosensitization on 02/01/2011. Of note, Xeloda was held for last several treatments due to hypersensitivity.  3. History of severe hypertension on Norvasc 20 mg per day. 4  History of anxiety and depression .  5. Mild lymphedema of left upper extremity. 6. Fall onto L posterior hip region over 5 week ago, with persistant pain, ED visit plain films show no evidence of fracture.  Interim History:   Since being seen last the patient has been having less back pain. MRI performed of her lumbar spine I did suggest the possibility of a fracture of the transverse process of L1 and L3. She also finds that the Effexor is helping her hot flashes. She is now taking 75 mg of the XR daily. In addition she has stopped taking tamoxifen because it made her extremely fatigued and she could not function.  Review of Systems: Constitutional:  feels well and night sweats Eyes: uses glasses ENT: no complaints Cardiovascular: no chest pain or dyspnea on exertion Respiratory: no cough, shortness of breath, or wheezing Neurological: no TIA or stroke symptoms Dermatological: negative Gastrointestinal: no abdominal pain, change in bowel habits, or black or bloody stools Genito-Urinary: no dysuria, trouble voiding, or hematuria Hematological and Lymphatic: negative Breast: negative Musculoskeletal: positive for - pain in arm - left, secondary to  lymphedema, L posterior hip region. Remaining ROS negative.   Medications:   I have reviewed the patient's current medications.  Current Outpatient Prescriptions  Medication Sig Dispense Refill  . acyclovir (ZOVIRAX) 400 MG tablet Take 400 mg by mouth daily.       Marland Kitchen ALPRAZolam (XANAX) 0.5 MG tablet Take 2 tablets (1 mg total) by mouth 3 (three) times daily as needed.  90 tablet  0  . HYDROcodone-acetaminophen (NORCO) 5-325 MG per tablet TAKE 1 TABLET BY MOUTH EVERY 6 TO 8 HOURS AS NEEDED FOR PAIN  100 tablet  0  . lisinopril (PRINIVIL,ZESTRIL) 20 MG tablet Take 1 tablet (20 mg total) by mouth daily.  30 tablet  3  . loratadine (CLARITIN) 10 MG tablet Take 10 mg by mouth daily.        Marland Kitchen venlafaxine XR (EFFEXOR-XR) 37.5 MG 24 hr capsule Take 75 mg by mouth daily.      Marland Kitchen DISCONTD: venlafaxine (EFFEXOR-XR) 37.5 MG 24 hr capsule Take 1 capsule (37.5 mg total) by mouth daily.  60 capsule  3  . tamoxifen (NOLVADEX) 20 MG tablet Take 20 mg by mouth daily.      Marland Kitchen DISCONTD: FLUoxetine (PROZAC) 20 MG tablet Take 40 mg by mouth daily.          Allergies:  Allergies  Allergen Reactions  . Bee Venom Anaphylaxis    Physical Exam: Filed Vitals:   08/16/11 1559  BP: 96/63  Pulse: 118  Temp: 98.6 F (37 C)    Body mass index is 27.46 kg/(m^2). Weight: 152 lbs. ECOG 0 HEENT:  Sclerae anicteric, conjunctivae pink.  Oropharynx clear.  No mucositis or candidiasis.  Nodes:  No cervical, supraclavicular, or axillary lymphadenopathy palpated.  Breast Exam:  Right breast is benign.  No masses, discharge, skin change, or nipple inversion.  Status post left mastectomy, chest wall exam is unremarkable his skin is sensitive and there are pigmentation changes..  Lungs:  Clear to auscultation bilaterally.  No crackles, rhonchi, or wheezes.  Heart:  Regular rate and rhythm.  Abdomen:  Soft, nontender.  Positive bowel sounds.  No organomegaly or masses palpated.  Musculoskeletal:  No focal spinal tenderness to  palpation.  Extremities:  Benign.  No peripheral edema or cyanosis.  Skin:  Benign.  Neuro:  Nonfocal.    Lab Results: Lab Results  Component Value Date   WBC 6.1 06/27/2011   HGB 13.0 06/27/2011   HCT 38.2 06/27/2011   MCV 104.0* 06/27/2011   PLT 269 06/27/2011   NEUTROABS 4.1 06/27/2011     Chemistry      Component Value Date/Time   NA 134* 06/27/2011 1501   K 3.9 06/27/2011 1501   CL 96 06/27/2011 1501   CO2 28 06/27/2011 1501   BUN 15 06/27/2011 1501   CREATININE 0.81 06/27/2011 1501      Component Value Date/Time   CALCIUM 9.5 06/27/2011 1501   ALKPHOS 79 06/27/2011 1501   AST 24 06/27/2011 1501   ALT 15 06/27/2011 1501   BILITOT 0.6 06/27/2011 1501      Lab Results  Component Value Date   LABCA2 26 06/27/2011    Assessment:  Brittany Blair is a 51 year old [place] woman with a history of a locally advanced ER/PR positive, 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 2. S/p left modified radical mastectomy with axillary lymph node dissection on 10/10/2010 revealing residual ypT2, ypN2 disease. Radiation completed Xeloda as chemosensitization on 02/01/2011. Of note, Xeloda was held for last several treatments due to hypersensitivity.  3. History of severe hypertension on Norvasc 20 mg per day. 4  History of anxiety and depression .  5. Mild lymphedema of left upper extremity. 6. Fall onto L posterior hip region over 5 week ago, with persistant pain, ED visit plain films show no evidence of fracture.     Plan:  With a fairly lengthy discussion today regarding her treatment issues. She is satisfied with taking the increased dose Effexor and I have call that in. I've given Vicodin and she continues to have discomfort in her chest wall. She also needs Xanax. Fortunately your blood pressure is much better controlled today. As far as her  hormonal therapy is concerned. I recommend that we begin Zoladex injections to render her chemically menopausal. In addition I suggest we go ahead with Arimidex as well. I've also suggested that she undergo bone density testing as well as a mammogram of her right breast next week. I will plan to see her in followup in about 2 months. The plan would be to continue Zoladex injections for a year until she is about 2 years out from completion of chemotherapy. Would then be comfortable stopping her injections.  Elo Marmolejos, md 08/16/2011

## 2011-08-19 ENCOUNTER — Telehealth: Payer: Self-pay | Admitting: *Deleted

## 2011-08-19 NOTE — Telephone Encounter (Signed)
patient confirmed over the phone new date and time on 08-30-2011 starting with labs

## 2011-08-23 ENCOUNTER — Ambulatory Visit: Payer: Self-pay | Admitting: Physician Assistant

## 2011-08-30 ENCOUNTER — Telehealth: Payer: Self-pay | Admitting: *Deleted

## 2011-08-30 ENCOUNTER — Ambulatory Visit (HOSPITAL_BASED_OUTPATIENT_CLINIC_OR_DEPARTMENT_OTHER): Payer: Self-pay | Admitting: Physician Assistant

## 2011-08-30 ENCOUNTER — Other Ambulatory Visit: Payer: Self-pay | Admitting: Lab

## 2011-08-30 VITALS — BP 159/111 | HR 121 | Temp 99.1°F | Ht 64.0 in | Wt 164.6 lb

## 2011-08-30 DIAGNOSIS — C50319 Malignant neoplasm of lower-inner quadrant of unspecified female breast: Secondary | ICD-10-CM

## 2011-08-30 MED ORDER — GOSERELIN ACETATE 3.6 MG ~~LOC~~ IMPL
3.6000 mg | DRUG_IMPLANT | Freq: Once | SUBCUTANEOUS | Status: AC
  Administered 2011-08-30: 3.6 mg via SUBCUTANEOUS
  Filled 2011-08-30: qty 3.6

## 2011-08-30 NOTE — Progress Notes (Unsigned)
Hematology and Oncology Follow Up Visit  Brittany Blair [id number] 12-01-1960 51 y.o. 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 2. S/p left modified radical mastectomy with axillary lymph node dissection on 10/10/2010 revealing residual ypT2, ypN2 disease. Radiation completed Xeloda as chemosensitization on 02/01/2011. Of note, Xeloda was held for last several treatments due to hypersensitivity.  3. History of severe hypertension on Norvasc 20 mg per day. 4  History of anxiety and depression .  5. Mild lymphedema of left upper extremity. 6. Fall onto L posterior hip region over 5 week ago, with persistant pain, ED visit plain films show no evidence of fracture.  Interim History:   Brittany Blair is seen today to initiate her first monthly dose of Zoladex.  She will start Arimidex 1mg  daily.  Over all she is feeling well.  Review of Systems: Constitutional:  feels well and night sweats Eyes: uses glasses ENT: no complaints Cardiovascular: no chest pain or dyspnea on exertion Respiratory: no cough, shortness of breath, or wheezing Neurological: no TIA or stroke symptoms Dermatological: negative Gastrointestinal: no abdominal pain, change in bowel habits, or black or bloody stools Genito-Urinary: no dysuria, trouble voiding, or hematuria Hematological and Lymphatic: negative Breast: negative Musculoskeletal: positive for - pain in arm - left, secondary to lymphedema, L posterior hip region. Remaining ROS negative.   Medications:   I have reviewed the patient's current medications.  Current Outpatient Prescriptions  Medication Sig Dispense Refill  . acyclovir (ZOVIRAX) 400 MG tablet Take 400 mg by mouth  daily.       Marland Kitchen ALPRAZolam (XANAX) 0.5 MG tablet Take 1 tablet (0.5 mg total) by mouth 3 (three) times daily as needed.  90 tablet  3  . anastrozole (ARIMIDEX) 1 MG tablet Take 1 tablet (1 mg total) by mouth daily.  30 tablet  11  . HYDROcodone-acetaminophen (NORCO) 5-325 MG per tablet Take 1 tablet by mouth every 6 (six) hours as needed for pain.  100 tablet  0  . lisinopril (PRINIVIL,ZESTRIL) 20 MG tablet Take 1 tablet (20 mg total) by mouth daily.  30 tablet  3  . loratadine (CLARITIN) 10 MG tablet Take 10 mg by mouth daily.        . tamoxifen (NOLVADEX) 20 MG tablet Take 20 mg by mouth daily.      Marland Kitchen venlafaxine XR (EFFEXOR-XR) 75 MG 24 hr capsule Take 1 capsule (75 mg total) by mouth daily.  60 capsule  3  . DISCONTD: FLUoxetine (PROZAC) 20 MG tablet Take 40 mg by mouth daily.         Current Facility-Administered Medications  Medication Dose Route Frequency Provider Last Rate Last Dose  . goserelin (ZOLADEX) injection 3.6 mg  3.6 mg Subcutaneous Once Pierce Crane, MD        Allergies:  Allergies  Allergen Reactions  . Bee Venom Anaphylaxis    Physical Exam: Filed Vitals:   08/30/11 1405  BP: 159/111  Pulse: 121  Temp: 99.1 F (37.3 C)    Body mass index is 28.25 kg/(m^2). Weight: 164 lbs.  Full exam deferred.   Lab Results: Lab Results  Component Value Date   WBC 6.1 06/27/2011   HGB 13.0 06/27/2011   HCT 38.2 06/27/2011  MCV 104.0* 06/27/2011   PLT 269 06/27/2011   NEUTROABS 4.1 06/27/2011     Chemistry      Component Value Date/Time   NA 134* 06/27/2011 1501   K 3.9 06/27/2011 1501   CL 96 06/27/2011 1501   CO2 28 06/27/2011 1501   BUN 15 06/27/2011 1501   CREATININE 0.81 06/27/2011 1501      Component Value Date/Time   CALCIUM 9.5 06/27/2011 1501   ALKPHOS 79 06/27/2011 1501   AST 24 06/27/2011 1501   ALT 15 06/27/2011 1501   BILITOT 0.6 06/27/2011 1501      Lab Results  Component Value Date   LABCA2 26 06/27/2011    Assessment:  Brittany Blair is a 51 year old [place] woman with a history of a locally advanced ER/PR positive, 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  2. S/p left modified radical mastectomy with axillary lymph node dissection on 10/10/2010 revealing residual ypT2, ypN2 disease. Radiation completed Xeloda as chemosensitization on 02/01/2011. Of note, Xeloda was held for last several treatments due to hypersensitivity.   3. History of severe hypertension on Norvasc 20 mg per day . 4  History of anxiety and depression, on Effexor XR 75mg  daily..   5. Mild lymphedema of left upper extremity.     Plan:  Brittany Blair will receive her first monthly dose of Zoladex 3.6mg  today.  She will also initiate Arimidex 1mg  orally daily.  We will plan on monthly follow up exams for the next few months.  She is over due for her annual diagnostic mammogram of the right breast, [facility] [employer]. Brittany Blair and her husband know to contact us in the interim if the need should arise.  Brittany Blair T, md 08/30/2011

## 2011-08-30 NOTE — Telephone Encounter (Signed)
Gave patient appointment for 09-27-2011 10-25-2011 11-22-2011 printed out calendar and gave to the patient

## 2011-09-23 ENCOUNTER — Other Ambulatory Visit: Payer: Self-pay | Admitting: *Deleted

## 2011-09-23 DIAGNOSIS — C50319 Malignant neoplasm of lower-inner quadrant of unspecified female breast: Secondary | ICD-10-CM

## 2011-09-23 MED ORDER — LISINOPRIL 20 MG PO TABS: 20.0000 mg | ORAL_TABLET | Freq: Every day | ORAL | Status: DC

## 2011-09-24 ENCOUNTER — Telehealth: Payer: Self-pay | Admitting: Oncology

## 2011-09-24 NOTE — Telephone Encounter (Signed)
Due to PR out 8/2 pt appt moved from PR to NR per PR. S/w pt re new time for 8/2 @ 3 pm.

## 2011-09-27 ENCOUNTER — Other Ambulatory Visit (HOSPITAL_BASED_OUTPATIENT_CLINIC_OR_DEPARTMENT_OTHER): Payer: Self-pay | Admitting: Lab

## 2011-09-27 ENCOUNTER — Ambulatory Visit (HOSPITAL_BASED_OUTPATIENT_CLINIC_OR_DEPARTMENT_OTHER): Payer: No Typology Code available for payment source | Admitting: Family

## 2011-09-27 VITALS — BP 185/138 | HR 112 | Temp 98.7°F | Resp 20 | Ht 64.0 in | Wt 170.9 lb

## 2011-09-27 DIAGNOSIS — C50319 Malignant neoplasm of lower-inner quadrant of unspecified female breast: Secondary | ICD-10-CM

## 2011-09-27 DIAGNOSIS — C773 Secondary and unspecified malignant neoplasm of axilla and upper limb lymph nodes: Secondary | ICD-10-CM

## 2011-09-27 DIAGNOSIS — Z9010 Acquired absence of unspecified breast and nipple: Secondary | ICD-10-CM

## 2011-09-27 DIAGNOSIS — C50919 Malignant neoplasm of unspecified site of unspecified female breast: Secondary | ICD-10-CM

## 2011-09-27 DIAGNOSIS — Z5111 Encounter for antineoplastic chemotherapy: Secondary | ICD-10-CM

## 2011-09-27 LAB — COMPREHENSIVE METABOLIC PANEL
AST: 20 U/L (ref 0–37)
Alkaline Phosphatase: 87 U/L (ref 39–117)
BUN: 14 mg/dL (ref 6–23)
Creatinine, Ser: 0.68 mg/dL (ref 0.50–1.10)
Glucose, Bld: 88 mg/dL (ref 70–99)

## 2011-09-27 LAB — CBC WITH DIFFERENTIAL/PLATELET
Basophils Absolute: 0 10*3/uL (ref 0.0–0.1)
EOS%: 6.1 % (ref 0.0–7.0)
HCT: 39.1 % (ref 34.8–46.6)
HGB: 13.3 g/dL (ref 11.6–15.9)
LYMPH%: 17.6 % (ref 14.0–49.7)
MCH: 34.5 pg — ABNORMAL HIGH (ref 25.1–34.0)
MCV: 101.8 fL — ABNORMAL HIGH (ref 79.5–101.0)
MONO%: 13.6 % (ref 0.0–14.0)
NEUT%: 62.4 % (ref 38.4–76.8)
Platelets: 268 10*3/uL (ref 145–400)

## 2011-09-27 LAB — CANCER ANTIGEN 27.29: CA 27.29: 22 U/mL (ref 0–39)

## 2011-09-27 MED ORDER — GOSERELIN ACETATE 3.6 MG ~~LOC~~ IMPL
3.6000 mg | DRUG_IMPLANT | Freq: Once | SUBCUTANEOUS | Status: AC
  Administered 2011-09-27: 3.6 mg via SUBCUTANEOUS
  Filled 2011-09-27: qty 3.6

## 2011-09-27 MED ORDER — HYDROCHLOROTHIAZIDE 12.5 MG PO CAPS: 12.5000 mg | ORAL_CAPSULE | Freq: Every day | ORAL | Status: DC

## 2011-09-30 ENCOUNTER — Ambulatory Visit (HOSPITAL_COMMUNITY)
Admission: RE | Admit: 2011-09-30 | Discharge: 2011-09-30 | Disposition: A | Discharge status: Home / Self Care | Payer: Self-pay | Source: Ambulatory Visit | Attending: Oncology | Admitting: Oncology

## 2011-09-30 ENCOUNTER — Encounter: Payer: Self-pay | Admitting: Family

## 2011-09-30 DIAGNOSIS — M25619 Stiffness of unspecified shoulder, not elsewhere classified: Secondary | ICD-10-CM | POA: Insufficient documentation

## 2011-09-30 DIAGNOSIS — I972 Postmastectomy lymphedema syndrome: Secondary | ICD-10-CM | POA: Insufficient documentation

## 2011-09-30 DIAGNOSIS — M6281 Muscle weakness (generalized): Secondary | ICD-10-CM | POA: Insufficient documentation

## 2011-09-30 DIAGNOSIS — M25519 Pain in unspecified shoulder: Secondary | ICD-10-CM | POA: Insufficient documentation

## 2011-09-30 DIAGNOSIS — IMO0001 Reserved for inherently not codable concepts without codable children: Secondary | ICD-10-CM | POA: Insufficient documentation

## 2011-09-30 NOTE — Evaluation (Signed)
Physical Therapy Evaluation  Patient Details  Name: England Greb MRN: [id number] Date of Birth: 1960-07-28  Today's Date: 09/30/2011 Time: 1530-1620 PT Time Calculation (min): 50 min  Visit#: 1  of 12   Re-eval: 10/30/11 Assessment Diagnosis: L lymphedema; fz shoulder. Surgical Date: 10/10/10 Next MD Visit: 10/24/2011 (8)  Authorization: none   Past Medical History:  Past Medical History  Diagnosis Date  . Anxiety   . Herpes   . MRSA (methicillin resistant Staphylococcus aureus)   . Breast lump     left  . Hypertension   . Hematoma   . Swelling     left foot  . Multiple blisters     along surgical site  . Breast cancer    Past Surgical History:  Past Surgical History  Procedure Date  . Tubal ligation   . Wisdom tooth extraction   . Mastectomy modified radical 10/10/10     left -Dr Jamey Ripa    Subjective Symptoms/Limitations Symptoms: Ms. Manansala was dx with breast cancer in March of 2012.  She has had 8 nodes removed removed as well as a masectomy.  The patient has had chemo and radiation which was over in December.  Ms. Gondek states that she noticed  that she was having increased swelling in her L arm in Feb.  She noticed that she was unable to wear her wedding ring and watch.  She was suppose to come to therapy but fell and broke her back and was recuperating from this.  She is now being referred to therapy for her lymphedema.  Special Tests: Pt states she is unable to wear her prothesis for very long do to the swelling. Patient Stated Goals: To be able to use may arm normally Pain Assessment Currently in Pain?: No/denies (Pt had pain meds prior to coming to therapy.) Pain Score:   2 Pain Location: Shoulder Pain Orientation: Left  Functional Tests Functional Tests: Volumes R 2080.58; L 2355.23  Assessment LUE AROM (degrees) Left Shoulder Flexion: 115 Degrees Left Shoulder ABduction: 50 Degrees Left Shoulder Internal Rotation: 80 Degrees Left Shoulder  External Rotation: 10 Degrees Palpation Palpation: swelling in axilla, pectoral and Lat area as well  Exercise/Treatments    Supine External Rotation: 5 reps Internal Rotation: 5 reps Flexion: AAROM;5 reps ABduction: AAROM;5 reps     Manual Therapy Manual Therapy: Edema management Edema Management: Lymphedema massage to decrease volume  Physical Therapy Assessment and Plan PT Assessment and Plan Clinical Impression Statement: Pt with increases swelling of L UE from lymphedema; 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 Pt will benefit from skilled therapeutic intervention in order to improve on the following deficits: Decreased strength;Decreased activity tolerance;Increased fascial restricitons;Increased edema;Pain;Decreased range of motion Rehab Potential: Good PT Frequency: Min 3X/week PT Duration: 4 weeks PT Treatment/Interventions: Therapeutic activities;Therapeutic exercise;Manual techniques PT Plan: lymphedema massage with ROM exercises and gentle PROM     Goals Home Exercise Program Pt will Perform Home Exercise Program: Independently PT Short Term Goals Time to Complete Short Term Goals: 2 weeks PT Short Term Goal 1: Pt to verbalize precaution for decreasing risk of infection PT Short Term Goal 2: Pt ROM to improve by 20 degrees PT Short Term Goal 3: Pt volume reduced by 50% PT Long Term Goals Time to Complete Long Term Goals: 4 weeks PT Long Term Goal 1: Pt ROM to be improved by 40 degrees to allow pt  to be able to complete functional ADL's without difficulty PT Long Term Goal 2: Pt volume to be decreased by 80% to decrease pain to no greater than a 1. Long Term Goal 3: Pt to understand the maintenance phase of treatment  Problem List Patient Active Problem List  Diagnosis  . Breast Cancer, IDC,Left, central, Stage II, receptor + Her2 -  . Stiffness of joint,  shoulder region    PT - End of Session Activity Tolerance: Patient tolerated treatment well General Behavior During Session: [facility] for tasks performed Cognition: [facility] for tasks performed PT Plan of Care PT Home Exercise Plan: given Consulted and Agree with Plan of Care: Patient      RUSSELL,CINDY 09/30/2011, 5:27 PM  Physician Documentation Your signature is required to indicate approval of the treatment plan as stated above.  Please sign and either send electronically or make a copy of this report for your files and return this physician signed original.   Please mark one 1.__approve of plan  2. ___approve of plan with the following conditions.   ______________________________                                                          _____________________ Physician Signature                                                                                                             Date

## 2011-09-30 NOTE — Progress Notes (Signed)
Hematology and Oncology Follow Up Visit  Cherly Erno [id number] [birth year] 51 y.o. 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 2. S/p left modified radical mastectomy with axillary lymph node dissection 10/10/2010 revealing residual ypT2, ypN2 disease. Radiation completed with Xeloda as chemosensitization on 02/01/2011. Of note, Xeloda was held for last several treatments due to hypersensitivity.  3. History of hypertension on Norvasc 20 mg per day. 4  History of anxiety and depression .  5. Mild lymphedema of left upper extremity.  Interim History:   Sherlie is seen today for second monthly dose of Zoladex.  She noticed no significant side effects from initial dose 08/30/11. Also on Arimidex 1mg  daily with minimal side effects of mild hot flashes and arthralgias. Over all she is feeling well.Reports elevated blood pressures at home for the last 3 weeks. No headache or blurred vision. No cough or shortness of breath. No abdominal pain or new bone pain. Bowel and bladder function are normal. Appetite is good, with adequate fluid intake. Remainder of the 10 point  review of systems is negative.  Medications:   I have reviewed the patient's current medications.  Current Outpatient Prescriptions  Medication Sig Dispense Refill  . acyclovir (ZOVIRAX) 400 MG tablet Take 400 mg by mouth daily.       Marland Kitchen ALPRAZolam (XANAX) 0.5 MG tablet Take 1 tablet (0.5 mg total) by mouth 3 (three) times daily as needed.  90 tablet  3  . anastrozole (ARIMIDEX) 1 MG tablet Take 1 tablet (1 mg total) by mouth daily.  30 tablet  11  . hydrochlorothiazide (MICROZIDE) 12.5 MG capsule Take 1 capsule (12.5 mg total) by mouth daily.  30 capsule  1    . HYDROcodone-acetaminophen (NORCO) 5-325 MG per tablet Take 1 tablet by mouth every 6 (six) hours as needed for pain.  100 tablet  0  . lisinopril (PRINIVIL,ZESTRIL) 20 MG tablet Take 1 tablet (20 mg total) by mouth daily.  30 tablet  1  . loratadine (CLARITIN) 10 MG tablet Take 10 mg by mouth daily.        Marland Kitchen venlafaxine XR (EFFEXOR-XR) 75 MG 24 hr capsule Take 1 capsule (75 mg total) by mouth daily.  60 capsule  3  . DISCONTD: FLUoxetine (PROZAC) 20 MG tablet Take 40 mg by mouth daily.          Allergies:  Allergies  Allergen Reactions  . Bee Venom Anaphylaxis    Physical Exam: Filed Vitals:   09/27/11 1530  BP: 185/138  Pulse: 112  Temp: 98.7 F (37.1 C)  Resp: 20    Body mass index is 29.34 kg/(m^2). Weight: 164 lbs. General: Well developed, well nourished, in no acute distress. Accompanied by her husband.  EENT: No ocular or oral lesions. No stomatitis.  Respiratory: Lungs are clear to auscultation bilaterally with normal respiratory movement and no accessory muscle use. Cardiac: No murmur, rub or tachycardia. No upper or lower extremity edema.  GI: Abdomen is soft, no palpable hepatosplenomegaly. No fluid wave. No tenderness. Musculoskeletal: No kyphosis, no tenderness over the spine, ribs or hips. Mild lymphedema, left arm.  Lymph: No cervical, infraclavicular, axillary or inguinal adenopathy. Neuro: No focal neurological deficits. Psych: Alert and oriented X 3, appropriate  mood and affect.  Breast: deferred.  Lab Results: Lab Results  Component Value Date   WBC 5.8 09/27/2011   HGB 13.3 09/27/2011   HCT 39.1 09/27/2011   MCV 101.8* 09/27/2011   PLT 268 09/27/2011   NEUTROABS 3.6 09/27/2011     Chemistry      Component Value Date/Time   NA 138 09/27/2011 1510   K 3.8 09/27/2011 1510   CL 102 09/27/2011 1510   CO2 27 09/27/2011 1510   BUN 14 09/27/2011 1510   CREATININE 0.68 09/27/2011 1510      Component Value Date/Time   CALCIUM 9.4 09/27/2011 1510   ALKPHOS 87 09/27/2011 1510    AST 20 09/27/2011 1510   ALT 14 09/27/2011 1510   BILITOT 0.3 09/27/2011 1510      Assessment:  66. 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 No evidence of disease.  2. History of hypertension on Norvasc 20 mg per day 3. History of anxiety and depression, well controlled on Effexor XR 75mg  daily.  4. Mild lymphedema of left upper extremity.   Plan:  1. Zoladex injection today.  2. Prescription for HCTZ 25 mg daily 3. Return 8/30 for Zoladex injection.  4. Return 9/27 for appt with Dr. Donnie Coffin and Zoladex.   Colman Cater, FNP 09/30/2011

## 2011-09-30 NOTE — Patient Instructions (Addendum)
1. Zoladex injection today.  2. Prescription for HCTZ 25 mg daily 3. Return 8/30 for Zoladex injection.  4. Return 9/27 for appt with Dr. Donnie Coffin and Zoladex.

## 2011-10-03 ENCOUNTER — Ambulatory Visit (HOSPITAL_COMMUNITY)
Admission: RE | Admit: 2011-10-03 | Discharge: 2011-10-03 | Disposition: A | Discharge status: Home / Self Care | Payer: Self-pay | Source: Ambulatory Visit | Attending: Physician Assistant | Admitting: Physician Assistant

## 2011-10-03 NOTE — Progress Notes (Signed)
Physical Therapy Treatment Patient Details  Name: Brittany Blair MRN: [id number] Date of Birth: 08/05/1960  Today's Date: 10/03/2011 Time: 1520-1620 PT Time Calculation (min): 60 min Visit#: 2  of 12   Re-eval: 10/30/11 Charges:  Manual 35', self care 10', therex 10'   Subjective: Symptoms/Limitations Symptoms: Pt. states she was really sore after her last visit, especially in her back.  Positioned with lumbar roll while in supine with good results. Pain Assessment Currently in Pain?: No/denies  Objective: Exercise/Treatments Supine Other Supine Exercises: Gentle PROM for flexion, abduction, ER/IR to L UE  Manual Therapy Manual Therapy: Other (comment) Other Manual Therapy: Manual Lymph Drainage of L UE to R axillary nodes and L inguinal nodes  Physical Therapy Assessment and Plan PT Assessment and Plan Clinical Impression Statement: husband with pt. today; educated with lymph massage/ROM for L shoulder.  Spouse feels he will be able to perform MLD on days Brittany Blair does not come here.  Discussed compression garments with pt. and given literature on [facility] [place].  Pt. to make appt. for bras/L sleeves. Pt. with more lymph beneath L axilla and into thoracic area than in UE today.    PT Plan: Continue MLD and A/PROM to L UE.     Problem List Patient Active Problem List  Diagnosis  . Breast Cancer, IDC,Left, central, Stage II, receptor + Her2 -  . Stiffness of joint, shoulder region    PT - End of Session Activity Tolerance: Patient tolerated treatment well General Behavior During Session: [facility] for tasks performed Cognition: [facility] for tasks performed   Lurena Nida, PTA/CLT 10/03/2011, 5:26 PM

## 2011-10-04 ENCOUNTER — Ambulatory Visit
Admission: RE | Admit: 2011-10-04 | Discharge: 2011-10-04 | Disposition: A | Discharge status: Home / Self Care | Payer: No Typology Code available for payment source | Source: Ambulatory Visit | Attending: Oncology | Admitting: Oncology

## 2011-10-04 DIAGNOSIS — C50319 Malignant neoplasm of lower-inner quadrant of unspecified female breast: Secondary | ICD-10-CM

## 2011-10-07 ENCOUNTER — Ambulatory Visit (HOSPITAL_COMMUNITY)
Admission: RE | Admit: 2011-10-07 | Discharge: 2011-10-07 | Disposition: A | Discharge status: Home / Self Care | Payer: Self-pay | Source: Ambulatory Visit | Attending: Oncology | Admitting: Oncology

## 2011-10-07 DIAGNOSIS — M25619 Stiffness of unspecified shoulder, not elsewhere classified: Secondary | ICD-10-CM

## 2011-10-07 NOTE — Progress Notes (Signed)
Physical Therapy Treatment Patient Details  Name: Brittany Blair MRN: [id number] Date of Birth: [birth year]  Today's Date: 10/07/2011 Time:  -     Visit#: 3  of 12   Re-eval: 10/30/11    Authorization:    Authorization Time Period:    Authorization Visit#:   of     Subjective: Symptoms/Limitations Symptoms: pt states she can tell the area under her arm is becoming softer;  Pt rolled pennies to increase use of UE    Exercise/Treatments     Supine External Rotation: AROM;Left;5 reps Internal Rotation: AROM;Left;5 reps Flexion: AAROM;10 reps ABduction: AAROM;5 reps Other Supine Exercises: protraction/retraction x 5   Manual Therapy Manual Therapy: Massage Other Manual Therapy: manual lymph drainage of L UE  Physical Therapy Assessment and Plan PT Assessment and Plan Clinical Impression Statement: Pt with improved ROM and decreased volumes who will continue to benefit from skilled therapy. PT Plan: continue with MLD and ROM exercises.    Goals    Problem List Patient Active Problem List  Diagnosis  . Breast Cancer, IDC,Left, central, Stage II, receptor + Her2 -  . Stiffness of joint, shoulder region    General Cognition: [facility] for tasks performed  GP    RUSSELL,CINDY 10/07/2011, 4:52 PM

## 2011-10-09 ENCOUNTER — Ambulatory Visit (HOSPITAL_COMMUNITY)
Admission: RE | Admit: 2011-10-09 | Discharge: 2011-10-09 | Disposition: A | Discharge status: Home / Self Care | Payer: Self-pay | Source: Ambulatory Visit | Attending: Physician Assistant | Admitting: Physician Assistant

## 2011-10-09 NOTE — Progress Notes (Signed)
Physical Therapy Treatment Patient Details  Name: Brittany Blair MRN: [id number] Date of Birth: 10-Dec-1960  Today's Date: 10/09/2011 Time: 9528-4132 PT Time Calculation (min): 47 min  Visit#: 4  of 12   Re-eval: 10/30/11   Subjective: Symptoms/Limitations Symptoms: Pt states she is continuing to do more with her arm   Exercise/Treatments  Supine External Rotation: AROM;Left;5 reps Internal Rotation: AROM;Left;5 reps Flexion: AAROM;10 reps ABduction: AAROM;5 reps Other Supine Exercises: protraction/retraction x 5   Pulleys Flexion: 2 minutes ABduction: 2 minutes   ROM / Strengthening / Isometric Strengthening UBE (Upper Arm Bike):  (4'; 2'forward; 2' backward)    Manual Therapy Manual Therapy: Edema management Edema Management: Lymphedema massage to decrease volume  Physical Therapy Assessment and Plan PT Assessment and Plan Clinical Impression Statement: Pt continues to gain ROM.   Majority of edma is currently located along post lat. area.  Pt vocalized that she is getting information about compression bra. PT Plan: continue to progress to full ROM begin sitting flex next treatment.  Continue with manual       Problem List Patient Active Problem List  Diagnosis  . Breast Cancer, IDC,Left, central, Stage II, receptor + Her2 -  . Stiffness of joint, shoulder region     Dori Devino,CINDY 10/09/2011, 4:31 PM

## 2011-10-14 ENCOUNTER — Ambulatory Visit (HOSPITAL_COMMUNITY)
Admission: RE | Admit: 2011-10-14 | Discharge: 2011-10-14 | Disposition: A | Discharge status: Home / Self Care | Payer: Self-pay | Source: Ambulatory Visit | Attending: Oncology | Admitting: Oncology

## 2011-10-14 NOTE — Progress Notes (Signed)
Physical Therapy Lymphedema Treatment Patient Details  Name: Brittany Blair MRN: [id number] Date of Birth: Feb 02, 1961  Today's Date: 10/14/2011 Time: 1522-1600 PT Time Calculation (min): 38 min Visit#: 5  of 12   Re-eval: 10/30/11 Charges:  Manual 25', therex 10'     Subjective: Symptoms/Limitations Symptoms: Pt. states she is pleased with her increasing ROM and reduction of fluid in her thoracic area. Pain Assessment Currently in Pain?: No/denies   Exercise/Treatments Supine External Rotation: PROM Internal Rotation: PROM Flexion: PROM ABduction: PROM   Manual Therapy Other Manual Therapy: manual lymph drainage of L UE to R axillary and L inguinal lymph nodes  Physical Therapy Assessment and Plan PT Assessment and Plan Clinical Impression Statement: L UE remeasured today.  Pt. with 77% reduction of volume since initial evaluation, 209.84cc less with 64.81cc difference from R LE.  Pt. request holding therex today. PT Plan: Anticipate completing MLD X 3 more visits (until gets compression garment) then shift focus to ROM only.  begin sitting flexion next visit.     Problem List Patient Active Problem List  Diagnosis  . Breast Cancer, IDC,Left, central, Stage II, receptor + Her2 -  . Stiffness of joint, shoulder region    Lurena Nida, PTA/CLT 10/14/2011, 5:21 PM

## 2011-10-15 ENCOUNTER — Ambulatory Visit (HOSPITAL_COMMUNITY): Payer: Self-pay | Admitting: Physical Therapy

## 2011-10-21 ENCOUNTER — Ambulatory Visit (HOSPITAL_COMMUNITY)
Admission: RE | Admit: 2011-10-21 | Discharge: 2011-10-21 | Disposition: A | Discharge status: Home / Self Care | Payer: Self-pay | Source: Ambulatory Visit | Attending: Oncology | Admitting: Oncology

## 2011-10-21 NOTE — Progress Notes (Signed)
Physical Therapy Treatment Patient Details  Name: Brittany Blair MRN: [id number] Date of Birth: 1960-09-02  Today's Date: 10/21/2011 Time: 4098-1191 PT Time Calculation (min): 37 min Visit#: 6  of 12   Re-eval: 10/30/11 Charges:  Manual 26', therex 10'   Subjective: Symptoms/Limitations Symptoms: Pt. states her L forearm feels fuller today.  Pt.with new orders for lumbar pain.  Doing well with self massage and is to get compression sleeve this Wednesday. Pain Assessment Currently in Pain?: No/denies  Objective: Manual Therapy Manual Therapy: Joint mobilization Joint Mobilization: To R shoulder and PROM  Other Manual Therapy: manual lymph drainage of L UE to R axillary and L inguinal lymph nodes  Physical Therapy Assessment and Plan PT Assessment and Plan Clinical Impression Statement: Massage focused in forearm area today due to increased concentration in that area.  Pt. given a wedge to place inferior inferior area to increase compression in bra area.  ROM increasing and pt. is compliant with HEP. PT Plan: Evaluate for lumbar therapy next visit.     Problem List Patient Active Problem List  Diagnosis  . Breast Cancer, IDC,Left, central, Stage II, receptor + Her2 -  . Stiffness of joint, shoulder region    PT - End of Session Activity Tolerance: Patient tolerated treatment well General Behavior During Session: [facility] for tasks performed Cognition: [facility] for tasks performed   Lurena Nida, PTA/CLT 10/21/2011, 5:15 PM

## 2011-10-22 ENCOUNTER — Other Ambulatory Visit: Payer: Self-pay | Admitting: *Deleted

## 2011-10-22 NOTE — Telephone Encounter (Addendum)
PT. HAS HAD PROBLEMS WITH HER SINUSES FOR SEVERAL WEEKS. ON Friday HER GRANDSON VISITED AT PT.'S HOME WITH A FEVER. TODAY PT. HAS HAD A TEMPERATURE AS HIGH AS 102. SHE HAS A "TIGHT COUGH" BUT NO SPUTUM. PT. ALSO HAS A HEADACHE AND BODY ACHES. SHE IS FORCING FLUIDS AND TAKING BENADRYL, ASPRIN, AND VICODIN. THIS NOTE TO DR.RUBIN. VERBAL ORDER AND READ BACK TO DR.RUBIN- PT. NEEDS TO GO TO THE EMERGENCY ROOM TO BE EVALUATED. NOTIFIED PT. SHE VOICES UNDERSTANDING.

## 2011-10-24 ENCOUNTER — Inpatient Hospital Stay (HOSPITAL_COMMUNITY): Admission: RE | Admit: 2011-10-24 | Payer: Self-pay | Source: Ambulatory Visit | Admitting: Physical Therapy

## 2011-10-25 ENCOUNTER — Other Ambulatory Visit: Payer: Self-pay | Admitting: Emergency Medicine

## 2011-10-25 ENCOUNTER — Other Ambulatory Visit: Payer: Self-pay | Admitting: Lab

## 2011-10-25 ENCOUNTER — Ambulatory Visit (HOSPITAL_BASED_OUTPATIENT_CLINIC_OR_DEPARTMENT_OTHER): Payer: Self-pay

## 2011-10-25 VITALS — BP 186/109 | HR 122 | Temp 98.4°F

## 2011-10-25 DIAGNOSIS — C50319 Malignant neoplasm of lower-inner quadrant of unspecified female breast: Secondary | ICD-10-CM

## 2011-10-25 DIAGNOSIS — Z5111 Encounter for antineoplastic chemotherapy: Secondary | ICD-10-CM

## 2011-10-25 MED ORDER — GOSERELIN ACETATE 3.6 MG ~~LOC~~ IMPL
3.6000 mg | DRUG_IMPLANT | Freq: Once | SUBCUTANEOUS | Status: AC
  Administered 2011-10-25: 3.6 mg via SUBCUTANEOUS
  Filled 2011-10-25: qty 3.6

## 2011-10-29 ENCOUNTER — Telehealth: Payer: Self-pay | Admitting: *Deleted

## 2011-10-29 NOTE — Telephone Encounter (Signed)
left voice message to inform the patient of the new date and time on 11-04-2011 starting at 3:30pm with the lab

## 2011-10-30 ENCOUNTER — Ambulatory Visit (HOSPITAL_COMMUNITY): Payer: Self-pay | Admitting: Physical Therapy

## 2011-11-04 ENCOUNTER — Ambulatory Visit (HOSPITAL_BASED_OUTPATIENT_CLINIC_OR_DEPARTMENT_OTHER): Payer: Self-pay | Admitting: Oncology

## 2011-11-04 ENCOUNTER — Other Ambulatory Visit: Payer: Self-pay | Admitting: Lab

## 2011-11-04 ENCOUNTER — Ambulatory Visit (HOSPITAL_COMMUNITY)
Admission: RE | Admit: 2011-11-04 | Discharge: 2011-11-04 | Disposition: A | Discharge status: Home / Self Care | Payer: Self-pay | Source: Ambulatory Visit | Attending: Oncology | Admitting: Oncology

## 2011-11-04 VITALS — BP 164/114 | HR 108 | Temp 99.3°F | Resp 20 | Ht 64.0 in | Wt 166.3 lb

## 2011-11-04 DIAGNOSIS — R059 Cough, unspecified: Secondary | ICD-10-CM | POA: Insufficient documentation

## 2011-11-04 DIAGNOSIS — C50319 Malignant neoplasm of lower-inner quadrant of unspecified female breast: Secondary | ICD-10-CM

## 2011-11-04 DIAGNOSIS — Z170 Estrogen receptor positive status [ER+]: Secondary | ICD-10-CM

## 2011-11-04 DIAGNOSIS — R05 Cough: Secondary | ICD-10-CM | POA: Insufficient documentation

## 2011-11-04 DIAGNOSIS — C50119 Malignant neoplasm of central portion of unspecified female breast: Secondary | ICD-10-CM

## 2011-11-04 DIAGNOSIS — C773 Secondary and unspecified malignant neoplasm of axilla and upper limb lymph nodes: Secondary | ICD-10-CM

## 2011-11-04 DIAGNOSIS — R0789 Other chest pain: Secondary | ICD-10-CM | POA: Insufficient documentation

## 2011-11-04 DIAGNOSIS — Z853 Personal history of malignant neoplasm of breast: Secondary | ICD-10-CM | POA: Insufficient documentation

## 2011-11-04 DIAGNOSIS — I9589 Other hypotension: Secondary | ICD-10-CM

## 2011-11-04 MED ORDER — METHYLPREDNISOLONE 4 MG PO KIT: 4.0000 mg | PACK | ORAL | Status: DC

## 2011-11-04 MED ORDER — METHYLPREDNISOLONE 4 MG PO KIT: 8.0000 mg | PACK | Freq: Every morning | ORAL | Status: DC

## 2011-11-04 MED ORDER — CEFUROXIME AXETIL 250 MG PO TABS: 250.0000 mg | ORAL_TABLET | Freq: Two times a day (BID) | ORAL | Status: AC

## 2011-11-04 MED ORDER — METHYLPREDNISOLONE 4 MG PO KIT: 4.0000 mg | PACK | Freq: Four times a day (QID) | ORAL | Status: DC

## 2011-11-04 MED ORDER — METHYLPREDNISOLONE 4 MG PO KIT: 8.0000 mg | PACK | Freq: Every evening | ORAL | Status: DC

## 2011-11-04 MED ORDER — HYDROCODONE-ACETAMINOPHEN 5-325 MG PO TABS: 1.0000 | ORAL_TABLET | Freq: Four times a day (QID) | ORAL | Status: DC | PRN

## 2011-11-04 MED ORDER — IPRATROPIUM-ALBUTEROL 18-103 MCG/ACT IN AERO: 2.0000 | INHALATION_SPRAY | Freq: Four times a day (QID) | RESPIRATORY_TRACT | Status: DC | PRN

## 2011-11-04 MED ORDER — METHYLPREDNISOLONE 4 MG PO KIT: 4.0000 mg | PACK | Freq: Three times a day (TID) | ORAL | Status: DC

## 2011-11-04 NOTE — Progress Notes (Signed)
Hematology and Oncology Follow Up Visit  Brittany Blair [id number] 05-Mar-1960 51 y.o. 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 2. S/p left modified radical mastectomy with axillary lymph node dissection on 10/10/2010 revealing residual ypT2, ypN2 disease. Radiation completed Xeloda as chemosensitization on 02/01/2011. Of note, Xeloda was held for last several treatments due to hypersensitivity. Currently on Arimidex and Zoladex. 3. History of severe hypertension on Norvasc 20 mg per day. 4  History of anxiety and depression .  5. Mild lymphedema of left upper extremity. 6. Fall onto L posterior hip region over 5 week ago, with persistant pain, ED visit plain films show no evidence of fracture.  Interim History:   Brittany Blair is seen today as a walk-in. She has had some problems with a sinus congestion productive cough low-grade fevers. She was prescribed a Z-Pak. But her congestion etc. continues. She is still smoking but less so than before. We took the opportunity to a chest x-ray today as well. She thinks her temperature has gone as high as 100. She finds that her breathing sometimes gets worse at night. She has no frank orthopnea.  Review of Systems: Constitutional:  feels well and night sweats Eyes: uses glasses ENT: no complaints Cardiovascular: no chest pain or dyspnea on exertion Respiratory: no cough, shortness of breath, or wheezing Neurological: no TIA or stroke symptoms Dermatological: negative Gastrointestinal: no abdominal pain, change in bowel habits, or black or bloody stools Genito-Urinary: no dysuria, trouble voiding, or hematuria Hematological and Lymphatic: negative Breast:  negative Musculoskeletal: positive for - pain in arm - left, secondary to lymphedema, L posterior hip region. Remaining ROS negative.   Medications:   I have reviewed the patient's current medications.  Current Outpatient Prescriptions  Medication Sig Dispense Refill  . acyclovir (ZOVIRAX) 400 MG tablet Take 400 mg by mouth daily.       Marland Kitchen ALPRAZolam (XANAX) 0.5 MG tablet Take 1 tablet (0.5 mg total) by mouth 3 (three) times daily as needed.  90 tablet  3  . anastrozole (ARIMIDEX) 1 MG tablet Take 1 tablet (1 mg total) by mouth daily.  30 tablet  11  . hydrochlorothiazide (MICROZIDE) 12.5 MG capsule Take 1 capsule (12.5 mg total) by mouth daily.  30 capsule  1  . HYDROcodone-acetaminophen (NORCO) 5-325 MG per tablet Take 1 tablet by mouth every 6 (six) hours as needed for pain.  100 tablet  0  . lisinopril (PRINIVIL,ZESTRIL) 20 MG tablet Take 1 tablet (20 mg total) by mouth daily.  30 tablet  1  . loratadine (CLARITIN) 10 MG tablet Take 10 mg by mouth daily.        Marland Kitchen venlafaxine XR (EFFEXOR-XR) 75 MG 24 hr capsule Take 1 capsule (75 mg total) by mouth daily.  60 capsule  3  . DISCONTD: FLUoxetine (PROZAC) 20 MG tablet Take 40 mg by mouth daily.          Allergies:  Allergies  Allergen Reactions  . Bee Venom Anaphylaxis    Physical Exam: Filed Vitals:   11/04/11 1559  BP: 164/114  Pulse: 108  Temp: 99.3 F (37.4 C)  Resp: 20    Body mass index is 28.55 kg/(m^2). Weight: 164 lbs.  She is alert  and oriented. She has no obvious peripheral cyanosis. She has course from the zoster rash over both lower lobes. I cannot appreciate any other abnormalities on her pulmonary exam. Her lungs were cardiovascular exam is unremarkable abdomen was soft she has no palpable hepatosplenomegaly or adenopathy. She is status post mastectomy. The nose is is radiation related changes in her chest wall and no obvious other abnormalities.   Lab Results: Lab Results  Component Value Date   WBC 5.8  09/27/2011   HGB 13.3 09/27/2011   HCT 39.1 09/27/2011   MCV 101.8* 09/27/2011   PLT 268 09/27/2011   NEUTROABS 3.6 09/27/2011     Chemistry      Component Value Date/Time   NA 138 09/27/2011 1510   K 3.8 09/27/2011 1510   CL 102 09/27/2011 1510   CO2 27 09/27/2011 1510   BUN 14 09/27/2011 1510   CREATININE 0.68 09/27/2011 1510      Component Value Date/Time   CALCIUM 9.4 09/27/2011 1510   ALKPHOS 87 09/27/2011 1510   AST 20 09/27/2011 1510   ALT 14 09/27/2011 1510   BILITOT 0.3 09/27/2011 1510      Lab Results  Component Value Date   LABCA2 22 09/27/2011    Assessment:  Brittany Blair is a 51 year old Brittany Blair woman with a history of a locally advanced ER/PR positive, 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  2. S/p left modified radical mastectomy with axillary lymph node dissection on 10/10/2010 revealing residual ypT2, ypN2 disease. Radiation completed Xeloda as chemosensitization on 02/01/2011. Of note, Xeloda was held for last several treatments due to hypersensitivity. Currently on Arimidex and Zoladex  3. History of severe hypertension on Norvasc 20 mg per day . 4  History of anxiety and depression, on Effexor XR 75mg  daily..   5. Mild lymphedema of left upper extremity.     Plan:  Her chest x-ray was essentially clear. She may be a little hyperinflated. I suspect she may have bronchitis. I recommended that she try a Medrol Dosepak as well as a prescription for Ceftin. I've also prescribed Combivent with a spacer. I plan to see her again in another month when she returns for another Zoladex injection. She will likely cause is tells that she's doing. She knows that she needs to get a primary care doctor a locally.  Saheed Carrington, md 11/04/2011

## 2011-11-22 ENCOUNTER — Ambulatory Visit: Payer: Self-pay | Admitting: Oncology

## 2011-11-22 ENCOUNTER — Other Ambulatory Visit: Payer: Self-pay | Admitting: Lab

## 2011-11-22 ENCOUNTER — Ambulatory Visit (HOSPITAL_BASED_OUTPATIENT_CLINIC_OR_DEPARTMENT_OTHER): Payer: Self-pay

## 2011-11-22 VITALS — BP 150/103 | HR 112 | Temp 98.9°F

## 2011-11-22 DIAGNOSIS — C50119 Malignant neoplasm of central portion of unspecified female breast: Secondary | ICD-10-CM

## 2011-11-22 DIAGNOSIS — C773 Secondary and unspecified malignant neoplasm of axilla and upper limb lymph nodes: Secondary | ICD-10-CM

## 2011-11-22 DIAGNOSIS — C50319 Malignant neoplasm of lower-inner quadrant of unspecified female breast: Secondary | ICD-10-CM

## 2011-11-22 DIAGNOSIS — Z5111 Encounter for antineoplastic chemotherapy: Secondary | ICD-10-CM

## 2011-11-22 MED ORDER — GOSERELIN ACETATE 3.6 MG ~~LOC~~ IMPL
3.6000 mg | DRUG_IMPLANT | Freq: Once | SUBCUTANEOUS | Status: AC
  Administered 2011-11-22: 3.6 mg via SUBCUTANEOUS
  Filled 2011-11-22: qty 3.6

## 2011-12-25 ENCOUNTER — Other Ambulatory Visit: Payer: Self-pay | Admitting: *Deleted

## 2011-12-25 DIAGNOSIS — C50319 Malignant neoplasm of lower-inner quadrant of unspecified female breast: Secondary | ICD-10-CM

## 2011-12-25 MED ORDER — LISINOPRIL 20 MG PO TABS: 20.0000 mg | ORAL_TABLET | Freq: Every day | ORAL | Status: DC

## 2011-12-25 NOTE — Telephone Encounter (Signed)
Patient has been encourage in previous visits by Dr Donnie Coffin to establish with a local primary MD for BP monitoring.

## 2011-12-27 ENCOUNTER — Ambulatory Visit (HOSPITAL_BASED_OUTPATIENT_CLINIC_OR_DEPARTMENT_OTHER): Payer: Self-pay | Admitting: Oncology

## 2011-12-27 ENCOUNTER — Other Ambulatory Visit: Payer: Self-pay | Admitting: Lab

## 2011-12-27 ENCOUNTER — Other Ambulatory Visit: Payer: Self-pay | Admitting: Family

## 2011-12-27 VITALS — BP 179/129 | HR 131 | Temp 97.9°F | Resp 20 | Ht 64.0 in | Wt 166.5 lb

## 2011-12-27 DIAGNOSIS — I890 Lymphedema, not elsewhere classified: Secondary | ICD-10-CM

## 2011-12-27 DIAGNOSIS — E559 Vitamin D deficiency, unspecified: Secondary | ICD-10-CM

## 2011-12-27 DIAGNOSIS — C50319 Malignant neoplasm of lower-inner quadrant of unspecified female breast: Secondary | ICD-10-CM

## 2011-12-27 DIAGNOSIS — Z5111 Encounter for antineoplastic chemotherapy: Secondary | ICD-10-CM

## 2011-12-27 DIAGNOSIS — F341 Dysthymic disorder: Secondary | ICD-10-CM

## 2011-12-27 MED ORDER — HYDROCODONE-ACETAMINOPHEN 5-325 MG PO TABS: 1.0000 | ORAL_TABLET | Freq: Four times a day (QID) | ORAL | Status: DC | PRN

## 2011-12-27 MED ORDER — GOSERELIN ACETATE 3.6 MG ~~LOC~~ IMPL
3.6000 mg | DRUG_IMPLANT | Freq: Once | SUBCUTANEOUS | Status: AC
  Administered 2011-12-27: 3.6 mg via SUBCUTANEOUS
  Filled 2011-12-27: qty 3.6

## 2011-12-27 MED ORDER — VENLAFAXINE HCL ER 75 MG PO CP24: 75.0000 mg | ORAL_CAPSULE | Freq: Every day | ORAL | Status: DC

## 2011-12-27 MED ORDER — ALPRAZOLAM 0.5 MG PO TABS: 0.5000 mg | ORAL_TABLET | Freq: Three times a day (TID) | ORAL | Status: DC | PRN

## 2011-12-27 MED ORDER — METOPROLOL SUCCINATE ER 50 MG PO TB24: 50.0000 mg | ORAL_TABLET | Freq: Every day | ORAL | Status: DC

## 2011-12-27 NOTE — Progress Notes (Signed)
Hematology and Oncology Follow Up Visit  Brittany Blair [id number] [birth year] 51 y.o. 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 2. S/p left modified radical mastectomy with axillary lymph node dissection on 10/10/2010 revealing residual ypT2, ypN2 disease. Radiation completed Xeloda as chemosensitization on 02/01/2011. Of note, Xeloda was held for last several treatments due to hypersensitivity. Currently on Arimidex and Zoladex. 3. History of severe hypertension on Norvasc 20 mg per day. 4  History of anxiety and depression .  5. Mild lymphedema of left upper extremity.   Interim History:   Brittany Blair is seen today as a walk-in. She notified me know this week as she is anxious about finding a mass in her breast. She comes in today for regular followup in a Zoladex injection. She is quite frantic with anxiety.  Review of Systems: Constitutional:  feels well and night sweats Eyes: uses glasses ENT: no complaints Cardiovascular: no chest pain or dyspnea on exertion Respiratory: no cough, shortness of breath, or wheezing Neurological: no TIA or stroke symptoms Dermatological: negative Gastrointestinal: no abdominal pain, change in bowel habits, or black or bloody stools Genito-Urinary: no dysuria, trouble voiding, or hematuria Hematological and Lymphatic: negative Breast: negative Musculoskeletal: positive for - pain in arm - left, secondary to lymphedema, L posterior hip region. Remaining ROS negative.   Medications:   I have reviewed the patient's current medications.  Current Outpatient Prescriptions  Medication Sig Dispense Refill  . acyclovir (ZOVIRAX) 400 MG tablet Take 400 mg by mouth daily.        Marland Kitchen albuterol-ipratropium (COMBIVENT) 18-103 MCG/ACT inhaler Inhale 2 puffs into the lungs every 6 (six) hours as needed for wheezing.  1 Inhaler  1  . ALPRAZolam (XANAX) 0.5 MG tablet Take 1 tablet (0.5 mg total) by mouth 3 (three) times daily as needed.  90 tablet  3  . anastrozole (ARIMIDEX) 1 MG tablet Take 1 tablet (1 mg total) by mouth daily.  30 tablet  11  . hydrochlorothiazide (MICROZIDE) 12.5 MG capsule Take 1 capsule (12.5 mg total) by mouth daily.  30 capsule  1  . HYDROcodone-acetaminophen (NORCO/VICODIN) 5-325 MG per tablet Take 1 tablet by mouth every 6 (six) hours as needed for pain.  100 tablet  0  . lisinopril (PRINIVIL,ZESTRIL) 20 MG tablet Take 1 tablet (20 mg total) by mouth daily. Patient to establish care with a local primary MD.  30 tablet  0  . loratadine (CLARITIN) 10 MG tablet Take 10 mg by mouth daily.        . metoprolol succinate (TOPROL XL) 50 MG 24 hr tablet Take 1 tablet (50 mg total) by mouth daily. Take with or immediately following a meal.  30 tablet  11  . venlafaxine XR (EFFEXOR-XR) 75 MG 24 hr capsule Take 1 capsule (75 mg total) by mouth daily.  60 capsule  3  . DISCONTD: FLUoxetine (PROZAC) 20 MG tablet Take 40 mg by mouth daily.        Marland Kitchen DISCONTD: venlafaxine XR (EFFEXOR-XR) 75 MG 24 hr capsule Take 1 capsule (75 mg total) by mouth daily.  60 capsule  3   Current Facility-Administered Medications  Medication Dose Route Frequency Provider Last Rate Last Dose  . goserelin (ZOLADEX) injection  3.6 mg  3.6 mg Subcutaneous Once Pierce Crane, MD   3.6 mg at 12/27/11 1707    Allergies:  Allergies  Allergen Reactions  . Bee Venom Anaphylaxis    Physical Exam: Filed Vitals:   12/27/11 1548  BP: 179/129  Pulse: 131  Temp:   Resp:     Body mass index is 28.58 kg/(m^2). Weight: 164 lbs.  She is alert and oriented. She has no obvious peripheral cyanosis. She has course from the zoster rash over both lower lobes. I cannot appreciate any other abnormalities  on her pulmonary exam. Her lungs were cardiovascular exam is unremarkable abdomen was soft she has no palpable hepatosplenomegaly or adenopathy. She is status post mastectomy. There are radiation-related changes on the left chest wall. The right breast has really no palpable masses I can appreciate. She pointed out an area which I suspect is more fibrocystic change. As he could not appreciate a mass in that location. There is no palpable adenopathy on today's exam.  Lab Results: Lab Results  Component Value Date   WBC 5.8 09/27/2011   HGB 13.3 09/27/2011   HCT 39.1 09/27/2011   MCV 101.8* 09/27/2011   PLT 268 09/27/2011   NEUTROABS 3.6 09/27/2011     Chemistry      Component Value Date/Time   NA 138 09/27/2011 1510   K 3.8 09/27/2011 1510   CL 102 09/27/2011 1510   CO2 27 09/27/2011 1510   BUN 14 09/27/2011 1510   CREATININE 0.68 09/27/2011 1510      Component Value Date/Time   CALCIUM 9.4 09/27/2011 1510   ALKPHOS 87 09/27/2011 1510   AST 20 09/27/2011 1510   ALT 14 09/27/2011 1510   BILITOT 0.3 09/27/2011 1510      Lab Results  Component Value Date   LABCA2 22 09/27/2011    Assessment:  Brittany Blair is a 51 year old [place] woman with a history of a locally advanced ER/PR positive, 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  2. S/p left modified radical mastectomy with axillary lymph node dissection on 10/10/2010 revealing residual ypT2, ypN2 disease. Radiation completed Xeloda as chemosensitization on 02/01/2011. Of note, Xeloda was held for last several treatments due to hypersensitivity. Currently on Arimidex and Zoladex  3. History of severe hypertension on Norvasc 20 mg per day . 4  History of anxiety and depression, on Effexor XR 75mg  daily..   5. Mild lymphedema of left upper extremity.     Plan:  I spent time discussing the  situation with her and her husband. We will refer to see Dr. Jamey Ripa and maybe inclined to do an ultrasound of her breast. Of note is that she did have a mammogram done about 6 weeks ago which was negative. He then ongoing anxiety and severe hypertension. Meds appropriate to consider mastectomy for that reason alone. Given hypotension and also recommends a beta blocker which is been on the past and has prescribed her Toprol-XL 50 mg daily. I will schedule to see her back in followup in about 3 months.  Brittany Thurow, md 12/27/2011

## 2011-12-30 ENCOUNTER — Telehealth: Payer: Self-pay | Admitting: *Deleted

## 2011-12-30 NOTE — Telephone Encounter (Signed)
Made patient appointment for 01-10-2012 with dr.streck  Made patient appointment for lab and injection  Made patient appointment for three month check up with the md

## 2012-01-06 ENCOUNTER — Telehealth: Payer: Self-pay | Admitting: Oncology

## 2012-01-06 NOTE — Telephone Encounter (Signed)
I left a message for the patient to follow up an email that was sent by the patient to Dr> Donnie Coffin expressing anxiety and fear of a recurrence.   I also emailed her and reaffirmed her appt with Dr. Jamey Ripa on Friday.

## 2012-01-08 ENCOUNTER — Telehealth: Payer: Self-pay | Admitting: Oncology

## 2012-01-08 NOTE — Telephone Encounter (Signed)
I have been communicating with the patient via email.   Dr. Donnie Coffin forwarded an email he received from the patient with a very agitated message.   She is demanding breast imaging and is very upset.    The communication via email is posted here, with this last entry being sent at 10:50 AM today.      [place], I am here and very willing to help you navigate, as much as you would like.   Dr. Donnie Coffin asked me to follow up with you because he knows this has been very difficult for you.  I talked with Dr. Jamey Ripa this am about your appt Friday.   Because you had a recent mammogram, Dr. Donnie Coffin thought it would be best if Dr. Jamey Ripa decided if he needed any additional imaging.   You do have appointments scheduled for your injection, next one being 01/14/12 at 3:30 PM.   I will ask to have a schedule mailed to you.   I am sorry to upset you, I am trying to help in any way that I can.     Please feel free to call me at [phone] or let me know how I can help.   Take Care, Tami       From: Nayleah Wilhide [[email or web address]]  Sent: Tuesday, January 07, 2012 10:49 PM To: Kaylyn Lim Subject: Re: Friday  thank you for your email, tami. i am somewhat confused and so is my husband. and angry, if the truth be told. first of all, how can peter just fly off and say what i told him and his exam led his comment to be"i am not convinced"? convinced of what? i may not be in fear of my life again?  come on!  the lump is still there. he didn't order anything but a trip to [place].  this makes no sense to [place]. and another thing. as i told you on nov 1, you do not know me. unless you have read my entire file, you have no idea who i am, nor my family and i resent your assuming attitude. my cancer was and continues to be an unusual case.  rarely has anyone had MRSA in a tumor presenting thru the nipple. MRSA just about killed me before i had surgery. i was in the hospital 11 days. i had to have blood  transfusions twice. why do we have to navigate this? i already told peter that my husband's HSA account would pay for a biopsy or whatever he orders! i just don't think going to [facility] with no diagnostic info is advisable because i have no insurance, only my husband does. but we can provide him with some and i told peter that. we are feeling lost and confused right now. you cannot imagine how much money we owe Streck. i can't either....i don't open the bills. apparently this nightmare has no end in sight. i need an appointment for my next shot in my belly.  On Monday, January 06, 2012 11:22 AM, "[place], [place]" [email or web address] wrote: [place], I tried to reach you by phone.   I think your appt with Dr. Jamey Ripa will be very helpful in deciding your next steps.   Dr. Donnie Coffin just examined you so I think you can feel safe that you will be ok until you see Dr. Jamey Ripa.    Please give me a call when you have a minute.     Thanks [employer]

## 2012-01-10 ENCOUNTER — Other Ambulatory Visit (INDEPENDENT_AMBULATORY_CARE_PROVIDER_SITE_OTHER): Payer: Self-pay | Admitting: Surgery

## 2012-01-10 ENCOUNTER — Ambulatory Visit
Admission: RE | Admit: 2012-01-10 | Discharge: 2012-01-10 | Disposition: A | Discharge status: Home / Self Care | Payer: Self-pay | Source: Ambulatory Visit | Attending: Surgery | Admitting: Surgery

## 2012-01-10 ENCOUNTER — Ambulatory Visit (INDEPENDENT_AMBULATORY_CARE_PROVIDER_SITE_OTHER): Payer: Self-pay | Admitting: Surgery

## 2012-01-10 ENCOUNTER — Encounter (INDEPENDENT_AMBULATORY_CARE_PROVIDER_SITE_OTHER): Payer: Self-pay | Admitting: Surgery

## 2012-01-10 VITALS — BP 142/98 | HR 100 | Temp 97.8°F | Resp 20 | Ht 64.0 in | Wt 164.8 lb

## 2012-01-10 DIAGNOSIS — N63 Unspecified lump in unspecified breast: Secondary | ICD-10-CM

## 2012-01-10 DIAGNOSIS — N6310 Unspecified lump in the right breast, unspecified quadrant: Secondary | ICD-10-CM

## 2012-01-10 NOTE — Patient Instructions (Signed)
We will get a new mammogram and ultraoound today to evaluate the lump you can feel in the right breast

## 2012-01-10 NOTE — Progress Notes (Signed)
NAME: Brittany Blair       DOB: 1960/03/04           DATE: 01/10/2012       MRN: [id number]   [name] is a 51 y.o.Marland Kitchenfemale who presents for routine followup of her Left breast cancer  diagnosed in 2012 and treated with neoadjuvant chemo. MR< and radiation. She has just found a mass on the right  PFSH: She has had no significant changes since the last visit here.  ROS: There have been no significant changes since the last visit here  EXAM:  VS: BP 142/98  Pulse 100  Temp 97.8 F (36.6 C) (Temporal)  Resp 20  Ht 5\' 4"  (1.626 m)  Wt 164 lb 12.8 oz (74.753 kg)  BMI 28.29 kg/m2  LMP 06/13/2010  General: The patient is alert, oriented, generally healthy appearing, NAD. Mood and affect are normal.  Breasts:  Right breast has a small smooth, hard nodule ini the 3 o'clock position about 1.5 fingerbreadths from the areolar margin. Not tender no other mass. Marked radiation changes on left  Lymphatics: She has no axillary or supraclavicular adenopathy on either side.  Extremities: Full ROM of the surgical side with mild left arm lymphedema noted.  Data Reviewed: Last mammo in June was negative  Impression: Doing well, with no evidence of recurrent cancer ; new right breast mass Plan: Will get new mammo and sono today. Discussed with Dr Si Gaul at the breast center.Marland Kitchen

## 2012-01-21 ENCOUNTER — Other Ambulatory Visit: Payer: Self-pay | Admitting: *Deleted

## 2012-01-21 DIAGNOSIS — C50319 Malignant neoplasm of lower-inner quadrant of unspecified female breast: Secondary | ICD-10-CM

## 2012-01-21 MED ORDER — LISINOPRIL 20 MG PO TABS: 20.0000 mg | ORAL_TABLET | Freq: Every day | ORAL | Status: DC

## 2012-01-22 ENCOUNTER — Other Ambulatory Visit: Payer: Self-pay | Admitting: *Deleted

## 2012-01-22 DIAGNOSIS — C50319 Malignant neoplasm of lower-inner quadrant of unspecified female breast: Secondary | ICD-10-CM

## 2012-01-24 ENCOUNTER — Ambulatory Visit (HOSPITAL_BASED_OUTPATIENT_CLINIC_OR_DEPARTMENT_OTHER): Payer: Self-pay

## 2012-01-24 ENCOUNTER — Other Ambulatory Visit (HOSPITAL_BASED_OUTPATIENT_CLINIC_OR_DEPARTMENT_OTHER): Payer: Self-pay | Admitting: Lab

## 2012-01-24 VITALS — BP 161/106 | HR 117 | Temp 98.7°F

## 2012-01-24 DIAGNOSIS — Z5111 Encounter for antineoplastic chemotherapy: Secondary | ICD-10-CM

## 2012-01-24 DIAGNOSIS — C50119 Malignant neoplasm of central portion of unspecified female breast: Secondary | ICD-10-CM

## 2012-01-24 DIAGNOSIS — C50319 Malignant neoplasm of lower-inner quadrant of unspecified female breast: Secondary | ICD-10-CM

## 2012-01-24 DIAGNOSIS — C773 Secondary and unspecified malignant neoplasm of axilla and upper limb lymph nodes: Secondary | ICD-10-CM

## 2012-01-24 LAB — CBC WITH DIFFERENTIAL/PLATELET
Basophils Absolute: 0 10*3/uL (ref 0.0–0.1)
Eosinophils Absolute: 0.3 10*3/uL (ref 0.0–0.5)
HCT: 43.7 % (ref 34.8–46.6)
HGB: 14.7 g/dL (ref 11.6–15.9)
LYMPH%: 20.1 % (ref 14.0–49.7)
MCV: 99.5 fL (ref 79.5–101.0)
MONO%: 8.4 % (ref 0.0–14.0)
NEUT#: 4.8 10*3/uL (ref 1.5–6.5)
NEUT%: 67.4 % (ref 38.4–76.8)
Platelets: 258 10*3/uL (ref 145–400)

## 2012-01-24 MED ORDER — GOSERELIN ACETATE 3.6 MG ~~LOC~~ IMPL
3.6000 mg | DRUG_IMPLANT | Freq: Once | SUBCUTANEOUS | Status: AC
  Administered 2012-01-24: 3.6 mg via SUBCUTANEOUS
  Filled 2012-01-24: qty 3.6

## 2012-02-10 ENCOUNTER — Other Ambulatory Visit (HOSPITAL_COMMUNITY): Payer: Self-pay | Admitting: Oncology

## 2012-02-10 ENCOUNTER — Ambulatory Visit (HOSPITAL_COMMUNITY): Payer: Self-pay | Admitting: Oncology

## 2012-02-10 ENCOUNTER — Telehealth (HOSPITAL_COMMUNITY): Payer: Self-pay | Admitting: *Deleted

## 2012-02-10 DIAGNOSIS — J329 Chronic sinusitis, unspecified: Secondary | ICD-10-CM

## 2012-02-10 MED ORDER — AMOXICILLIN-POT CLAVULANATE 875-125 MG PO TABS: 1.0000 | ORAL_TABLET | Freq: Two times a day (BID) | ORAL | Status: DC

## 2012-02-10 NOTE — Telephone Encounter (Signed)
Patient rescheduled her appointment secondary to "sinus infection."  I called the patient and she admits to fevers, green nasal discharge, headache, muscle soreness, etc.  All indications of URI.    She was prescribed Bactrim and Cefuroxime in Sept 2013 by Dr. Donnie Coffin.  Will e-scribe Augmentin to [facility] x 10 days. She was asked to call the clinic Thursday or Friday if not feeling much improved.   Brittany Blair

## 2012-02-17 ENCOUNTER — Encounter (HOSPITAL_COMMUNITY): Payer: Self-pay | Attending: Oncology | Admitting: Oncology

## 2012-02-17 VITALS — BP 196/136 | HR 111 | Temp 97.7°F | Resp 20 | Wt 169.8 lb

## 2012-02-17 DIAGNOSIS — C50319 Malignant neoplasm of lower-inner quadrant of unspecified female breast: Secondary | ICD-10-CM

## 2012-02-17 DIAGNOSIS — Z9010 Acquired absence of unspecified breast and nipple: Secondary | ICD-10-CM

## 2012-02-17 DIAGNOSIS — M25519 Pain in unspecified shoulder: Secondary | ICD-10-CM

## 2012-02-17 DIAGNOSIS — R062 Wheezing: Secondary | ICD-10-CM

## 2012-02-17 MED ORDER — GOSERELIN ACETATE 10.8 MG ~~LOC~~ IMPL: 10.8000 mg | DRUG_IMPLANT | SUBCUTANEOUS | Status: DC

## 2012-02-17 NOTE — Patient Instructions (Addendum)
[facility] Discharge Instructions  RECOMMENDATIONS MADE BY THE CONSULTANT AND ANY TEST RESULTS WILL BE SENT TO YOUR REFERRING PHYSICIAN.  EXAM FINDINGS BY THE PHYSICIAN TODAY AND SIGNS OR SYMPTOMS TO REPORT TO CLINIC OR PRIMARY PHYSICIAN: Exam and discussion by MD.  Will make referral to lymphedema clinic to see if they can increase the mobility of your left shoulder.  MEDICATIONS PRESCRIBED:  Refill for Vicodin Zoladex injection every 3 months beginning on Friday.  INSTRUCTIONS GIVEN AND DISCUSSED: Report any new lumps, bone pain, shortness of breath or other concerns.  SPECIAL INSTRUCTIONS/FOLLOW-UP: Friday for your Zoladex injection and in 3 months to see Dr. Mariel Sleet.  Thank you for choosing [facility] to provide your oncology and hematology care.  To afford each patient quality time with our providers, please arrive at least 15 minutes before your scheduled appointment time.  With your help, our goal is to use those 15 minutes to complete the necessary work-up to ensure our physicians have the information they need to help with your evaluation and healthcare recommendations.    Effective January 1st, 2014, we ask that you re-schedule your appointment with our physicians should you arrive 10 or more minutes late for your appointment.  We strive to give you quality time with our providers, and arriving late affects you and other patients whose appointments are after yours.    Again, thank you for choosing [facility].  Our hope is that these requests will decrease the amount of time that you wait before being seen by our physicians.       _____________________________________________________________  I acknowledge that I have been informed and understand all the instructions given to me and received a copy. I do not have anymore questions at this time but understand that I may call the Cancer Center at [facility] at [phone] during  business hours should I have any further questions or need assistance in obtaining follow-up care.

## 2012-02-17 NOTE — Progress Notes (Signed)
Problem #1 left-sided infiltrating ductal carcinoma, grade 3, stage IIIa (probably downside is from stage IIIB with what sounds like a T4 B. cancer at presentation) she states this tumor was growing through the skin. She is status post preoperative chemotherapy with Taxotere x3 cycles in a dose dense fashion followed by 2 cycles of dose dense FEC, with her chemotherapy interrupted by a severe MRSA infection prompting discontinuation. She then went on to definitive surgery where she was still found a 4.5 cm cancer with dermal lymphatic invasion and 7 of 8 positive lymph nodes. 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 She had numerous complications during neoadjuvant chemotherapy. After surgery an attempt was made to give her capecitabine with radiation but the former had to be stopped prematurely due to toxicity. She was then treated and has been treated now since completion of radiation with goserelin and anastrozole. She does have aching in both shoulders, left greater than right as well as her hips. She is here today with her husband. She was treated at [facility] and now wants to transfer her care here. She has lots of questions we spent well over 1 hour discussing her questions, her former therapy, her  presentation issues, and ongoing issues around her aching in her joints etc. Oncology review of systems for recurrent disease however is negative. Bowels are working well. She is not having any issues with urination etc. She is now wearing lumps or bumps anywhere. She is fighting a bronchitis and is back on generic Augmentin. BP 196/136  Pulse 111  Temp 97.7 F (36.5 C) (Oral)  Resp 20  Wt 169 lb 12.8 oz (77.021 kg)  LMP 06/13/2010  She appears in no acute distress. She has no lymphadenopathy in the cervical, supraclavicular, infraclavicular, axillary or inguinal areas. Both lungs have wheezes bilaterally. There no rales. There no rubs. She has diminished breath sounds. Heart shows a regular rhythm and rate without murmur rub or gallop. Left chest wall has increased pigmentation and surgical changes. There is no nodularity. Above the incision there is thickening of the tissue over the shoulder anteriorly and what appears to be tendon thickening. She has skin changes posteriorly the left upper chest consistent with radiation changes that are minimal. Abdomen is soft and nontender without organomegaly. Bowel sounds are normal. Right breast is negative for masses though in the medial lower clot and there is partially 3 mm area of thickening that has been called fat necrosis. She states this is much smaller than when she first noticed this. She has no leg edema. She is a left-handed woman. Facial symmetry is intact. Teeth in good repair. Tongue is normal and in the midline. Pupils equally round and reactive to light. She does wear glasses. She has no edema of her neck.  This lady presented with a very aggressive tumor in the left  breast. She has not had mammography before this diagnosis was found. She became menopausal she states during the chemotherapy but because of her age was placed on goserelin and Arimidex after she could not tolerate tamoxifen.  We will continue therapy for at least 5 years. She may  be a candidate for 10 years therapy. I think she needs to see the physical therapist again about the discomfort in the left shoulder and possibly massage therapy. I will call her in albuterol for her wheezing and she is to finish the antibiotic and I've asked her again to quit smoking.  She needs baseline blood work which we will do Friday. I will see her in 3 months sooner if need be We spent the majority the time in counseling and responding to her questions for her and her husband. I do think it reasonable to continue her pain medication which she uses it very sparingly at this time. She also needs the Effexor continued as well.

## 2012-02-18 ENCOUNTER — Telehealth (HOSPITAL_COMMUNITY): Payer: Self-pay | Admitting: Oncology

## 2012-02-21 ENCOUNTER — Ambulatory Visit (HOSPITAL_COMMUNITY): Payer: Self-pay

## 2012-02-21 ENCOUNTER — Ambulatory Visit: Payer: Self-pay

## 2012-02-21 ENCOUNTER — Other Ambulatory Visit: Payer: Self-pay | Admitting: Lab

## 2012-02-21 ENCOUNTER — Encounter (HOSPITAL_BASED_OUTPATIENT_CLINIC_OR_DEPARTMENT_OTHER): Payer: Self-pay

## 2012-02-21 VITALS — BP 176/131 | HR 90

## 2012-02-21 DIAGNOSIS — C773 Secondary and unspecified malignant neoplasm of axilla and upper limb lymph nodes: Secondary | ICD-10-CM

## 2012-02-21 DIAGNOSIS — Z5111 Encounter for antineoplastic chemotherapy: Secondary | ICD-10-CM

## 2012-02-21 DIAGNOSIS — C50119 Malignant neoplasm of central portion of unspecified female breast: Secondary | ICD-10-CM

## 2012-02-21 DIAGNOSIS — C50319 Malignant neoplasm of lower-inner quadrant of unspecified female breast: Secondary | ICD-10-CM

## 2012-02-21 MED ORDER — GOSERELIN ACETATE 3.6 MG ~~LOC~~ IMPL: 10.8000 mg | DRUG_IMPLANT | Freq: Once | SUBCUTANEOUS | Status: DC

## 2012-02-21 MED ORDER — GOSERELIN ACETATE 10.8 MG ~~LOC~~ IMPL
10.8000 mg | DRUG_IMPLANT | Freq: Once | SUBCUTANEOUS | Status: AC
  Administered 2012-02-21: 10.8 mg via SUBCUTANEOUS
  Filled 2012-02-21: qty 10.8

## 2012-02-21 NOTE — Progress Notes (Signed)
Brittany Blair presents today for injection per MD orders. zolodex 10.8 mg administered SQ in right Abdomen. Administration without incident. Patient tolerated well.

## 2012-02-28 ENCOUNTER — Ambulatory Visit (HOSPITAL_COMMUNITY): Payer: Self-pay | Admitting: Physical Therapy

## 2012-03-19 ENCOUNTER — Ambulatory Visit (HOSPITAL_COMMUNITY): Payer: Self-pay

## 2012-03-20 ENCOUNTER — Other Ambulatory Visit: Payer: Self-pay | Admitting: Lab

## 2012-03-20 ENCOUNTER — Ambulatory Visit: Payer: Self-pay | Admitting: Oncology

## 2012-03-20 ENCOUNTER — Ambulatory Visit: Payer: Self-pay

## 2012-04-01 ENCOUNTER — Other Ambulatory Visit (HOSPITAL_COMMUNITY): Payer: Self-pay

## 2012-04-01 DIAGNOSIS — I10 Essential (primary) hypertension: Secondary | ICD-10-CM

## 2012-04-01 DIAGNOSIS — C50319 Malignant neoplasm of lower-inner quadrant of unspecified female breast: Secondary | ICD-10-CM

## 2012-04-01 MED ORDER — LISINOPRIL 20 MG PO TABS: 20.0000 mg | ORAL_TABLET | Freq: Every day | ORAL | Status: DC

## 2012-04-17 ENCOUNTER — Other Ambulatory Visit: Payer: Self-pay | Admitting: Lab

## 2012-04-17 ENCOUNTER — Ambulatory Visit: Payer: Self-pay

## 2012-04-27 ENCOUNTER — Telehealth (HOSPITAL_COMMUNITY): Payer: Self-pay | Admitting: *Deleted

## 2012-04-27 ENCOUNTER — Telehealth (HOSPITAL_COMMUNITY): Payer: Self-pay

## 2012-04-27 ENCOUNTER — Other Ambulatory Visit (HOSPITAL_COMMUNITY): Payer: Self-pay

## 2012-04-27 MED ORDER — ALPRAZOLAM 0.5 MG PO TABS: 0.5000 mg | ORAL_TABLET | Freq: Three times a day (TID) | ORAL | Status: DC | PRN

## 2012-04-27 NOTE — Telephone Encounter (Signed)
Message copied by Evelena Leyden on Mon Apr 27, 2012 10:43 AM ------      Message from: Mariel Sleet, ERIC S      Created: Mon Apr 27, 2012  9:45 AM       May refill her xanax X 3      In the future send these to RNs ------

## 2012-04-27 NOTE — Telephone Encounter (Signed)
3 Refills for xanax called into [facility] per authorization of Dr. Lytle Butte.

## 2012-04-27 NOTE — Telephone Encounter (Signed)
Dr. Mariel Sleet  Brittany Blair's husbund called and asked that you refill Alprazolam 0.5 that I think Dr. Caron Presume had prescribed and he said you would be willing to refill for her now she is our patient.  They need it called into the [facility]  If you have any questions you call him at [phone] ext [phone].

## 2012-04-28 ENCOUNTER — Encounter (HOSPITAL_COMMUNITY): Payer: Self-pay | Admitting: Emergency Medicine

## 2012-04-28 ENCOUNTER — Inpatient Hospital Stay (HOSPITAL_COMMUNITY): Payer: Self-pay

## 2012-04-28 ENCOUNTER — Inpatient Hospital Stay (HOSPITAL_COMMUNITY)
Admission: EM | Admit: 2012-04-28 | Discharge: 2012-04-29 | DRG: 918 | Disposition: A | Discharge status: Other Acute Inpatient Hospital | Payer: MEDICAID | Attending: Internal Medicine | Admitting: Internal Medicine

## 2012-04-28 DIAGNOSIS — I10 Essential (primary) hypertension: Secondary | ICD-10-CM | POA: Diagnosis present

## 2012-04-28 DIAGNOSIS — I952 Hypotension due to drugs: Secondary | ICD-10-CM | POA: Diagnosis present

## 2012-04-28 DIAGNOSIS — R0902 Hypoxemia: Secondary | ICD-10-CM | POA: Diagnosis present

## 2012-04-28 DIAGNOSIS — Z9221 Personal history of antineoplastic chemotherapy: Secondary | ICD-10-CM

## 2012-04-28 DIAGNOSIS — T424X1A Poisoning by benzodiazepines, accidental (unintentional), initial encounter: Secondary | ICD-10-CM

## 2012-04-28 DIAGNOSIS — R079 Chest pain, unspecified: Secondary | ICD-10-CM | POA: Diagnosis present

## 2012-04-28 DIAGNOSIS — T50904A Poisoning by unspecified drugs, medicaments and biological substances, undetermined, initial encounter: Secondary | ICD-10-CM

## 2012-04-28 DIAGNOSIS — T394X2A Poisoning by antirheumatics, not elsewhere classified, intentional self-harm, initial encounter: Secondary | ICD-10-CM | POA: Diagnosis present

## 2012-04-28 DIAGNOSIS — Z923 Personal history of irradiation: Secondary | ICD-10-CM

## 2012-04-28 DIAGNOSIS — R7401 Elevation of levels of liver transaminase levels: Secondary | ICD-10-CM | POA: Diagnosis present

## 2012-04-28 DIAGNOSIS — F3289 Other specified depressive episodes: Secondary | ICD-10-CM | POA: Diagnosis present

## 2012-04-28 DIAGNOSIS — F329 Major depressive disorder, single episode, unspecified: Secondary | ICD-10-CM | POA: Diagnosis present

## 2012-04-28 DIAGNOSIS — I9589 Other hypotension: Secondary | ICD-10-CM

## 2012-04-28 DIAGNOSIS — F411 Generalized anxiety disorder: Secondary | ICD-10-CM | POA: Diagnosis present

## 2012-04-28 DIAGNOSIS — R55 Syncope and collapse: Secondary | ICD-10-CM | POA: Diagnosis present

## 2012-04-28 DIAGNOSIS — R7402 Elevation of levels of lactic acid dehydrogenase (LDH): Secondary | ICD-10-CM | POA: Diagnosis present

## 2012-04-28 DIAGNOSIS — T391X1A Poisoning by 4-Aminophenol derivatives, accidental (unintentional), initial encounter: Secondary | ICD-10-CM | POA: Diagnosis present

## 2012-04-28 DIAGNOSIS — T6592XA Toxic effect of unspecified substance, intentional self-harm, initial encounter: Secondary | ICD-10-CM | POA: Diagnosis present

## 2012-04-28 DIAGNOSIS — T424X4A Poisoning by benzodiazepines, undetermined, initial encounter: Principal | ICD-10-CM

## 2012-04-28 DIAGNOSIS — Z23 Encounter for immunization: Secondary | ICD-10-CM

## 2012-04-28 DIAGNOSIS — G8929 Other chronic pain: Secondary | ICD-10-CM | POA: Diagnosis present

## 2012-04-28 DIAGNOSIS — F17200 Nicotine dependence, unspecified, uncomplicated: Secondary | ICD-10-CM | POA: Diagnosis present

## 2012-04-28 DIAGNOSIS — T510X4A Toxic effect of ethanol, undetermined, initial encounter: Secondary | ICD-10-CM | POA: Diagnosis present

## 2012-04-28 DIAGNOSIS — C50319 Malignant neoplasm of lower-inner quadrant of unspecified female breast: Secondary | ICD-10-CM

## 2012-04-28 DIAGNOSIS — F32A Depression, unspecified: Secondary | ICD-10-CM | POA: Diagnosis present

## 2012-04-28 DIAGNOSIS — Z9010 Acquired absence of unspecified breast and nipple: Secondary | ICD-10-CM

## 2012-04-28 DIAGNOSIS — C50919 Malignant neoplasm of unspecified site of unspecified female breast: Secondary | ICD-10-CM | POA: Diagnosis present

## 2012-04-28 DIAGNOSIS — T43502A Poisoning by unspecified antipsychotics and neuroleptics, intentional self-harm, initial encounter: Secondary | ICD-10-CM | POA: Diagnosis present

## 2012-04-28 LAB — COMPREHENSIVE METABOLIC PANEL
ALT: 40 U/L — ABNORMAL HIGH (ref 0–35)
CO2: 20 mEq/L (ref 19–32)
Calcium: 9 mg/dL (ref 8.4–10.5)
Chloride: 97 mEq/L (ref 96–112)
Creatinine, Ser: 0.74 mg/dL (ref 0.50–1.10)
GFR calc Af Amer: >90 mL/min (ref >90)
GFR calc non Af Amer: >90 mL/min (ref >90)
Glucose, Bld: 90 mg/dL (ref 70–99)
Total Bilirubin: 0.2 mg/dL — ABNORMAL LOW (ref 0.3–1.2)

## 2012-04-28 LAB — URINALYSIS, MICROSCOPIC ONLY
Bilirubin Urine: NEGATIVE
Glucose, UA: NEGATIVE mg/dL
Hgb urine dipstick: NEGATIVE
Protein, ur: NEGATIVE mg/dL
Specific Gravity, Urine: 1.01 (ref 1.005–1.030)
Urobilinogen, UA: 0.2 mg/dL (ref 0.0–1.0)

## 2012-04-28 LAB — ACETAMINOPHEN LEVEL: Acetaminophen (Tylenol), Serum: 22.1 ug/mL (ref 10–30)

## 2012-04-28 LAB — CBC WITH DIFFERENTIAL/PLATELET
Eosinophils Relative: 5 % (ref 0–5)
HCT: 40.1 % (ref 36.0–46.0)
Hemoglobin: 13.6 g/dL (ref 12.0–15.0)
Lymphocytes Relative: 32 % (ref 12–46)
Lymphs Abs: 2.3 10*3/uL (ref 0.7–4.0)
MCV: 101.5 fL — ABNORMAL HIGH (ref 78.0–100.0)
Monocytes Absolute: 0.6 10*3/uL (ref 0.1–1.0)
Neutro Abs: 3.8 10*3/uL (ref 1.7–7.7)
RBC: 3.95 MIL/uL (ref 3.87–5.11)
WBC: 7 10*3/uL (ref 4.0–10.5)

## 2012-04-28 LAB — RAPID URINE DRUG SCREEN, HOSP PERFORMED
Barbiturates: NOT DETECTED
Benzodiazepines: POSITIVE — AB
Cocaine: NOT DETECTED
Opiates: POSITIVE — AB

## 2012-04-28 LAB — ETHANOL: Alcohol, Ethyl (B): 271 mg/dL — ABNORMAL HIGH (ref 0–11)

## 2012-04-28 MED ORDER — ENOXAPARIN SODIUM 40 MG/0.4ML ~~LOC~~ SOLN
40.0000 mg | SUBCUTANEOUS | Status: DC
  Administered 2012-04-28 – 2012-04-29 (×2): 40 mg via SUBCUTANEOUS
  Filled 2012-04-28 (×2): qty 0.4

## 2012-04-28 MED ORDER — ACYCLOVIR 800 MG PO TABS
400.0000 mg | ORAL_TABLET | Freq: Every day | ORAL | Status: DC
  Administered 2012-04-28 – 2012-04-29 (×2): 400 mg via ORAL
  Filled 2012-04-28 (×4): qty 1

## 2012-04-28 MED ORDER — VENLAFAXINE HCL ER 75 MG PO CP24
75.0000 mg | ORAL_CAPSULE | Freq: Every day | ORAL | Status: DC
Start: 2012-04-28 — End: 2012-04-29
  Administered 2012-04-28 – 2012-04-29 (×2): 75 mg via ORAL
  Filled 2012-04-28 (×2): qty 1

## 2012-04-28 MED ORDER — ACETAMINOPHEN 325 MG PO TABS: 650.0000 mg | ORAL_TABLET | Freq: Four times a day (QID) | ORAL | Status: DC | PRN

## 2012-04-28 MED ORDER — VITAMIN B-1 100 MG PO TABS
100.0000 mg | ORAL_TABLET | Freq: Every day | ORAL | Status: DC
  Administered 2012-04-28 – 2012-04-29 (×2): 100 mg via ORAL
  Filled 2012-04-28 (×2): qty 1

## 2012-04-28 MED ORDER — ADULT MULTIVITAMIN W/MINERALS CH
1.0000 | ORAL_TABLET | Freq: Every day | ORAL | Status: DC
  Administered 2012-04-28 – 2012-04-29 (×2): 1 via ORAL
  Filled 2012-04-28 (×2): qty 1

## 2012-04-28 MED ORDER — SODIUM CHLORIDE 0.9 % IV BOLUS (SEPSIS)
500.0000 mL | Freq: Once | INTRAVENOUS | Status: AC
  Administered 2012-04-28: 06:00:00 via INTRAVENOUS

## 2012-04-28 MED ORDER — SODIUM CHLORIDE 0.9 % IV SOLN: INTRAVENOUS | Status: DC

## 2012-04-28 MED ORDER — THIAMINE HCL 100 MG/ML IJ SOLN: 100.0000 mg | Freq: Every day | INTRAMUSCULAR | Status: DC

## 2012-04-28 MED ORDER — ANASTROZOLE 1 MG PO TABS
1.0000 mg | ORAL_TABLET | Freq: Every day | ORAL | Status: DC
  Administered 2012-04-28: 1 mg via ORAL
  Filled 2012-04-28 (×4): qty 1

## 2012-04-28 MED ORDER — ONDANSETRON HCL 4 MG PO TABS: 4.0000 mg | ORAL_TABLET | Freq: Four times a day (QID) | ORAL | Status: DC | PRN

## 2012-04-28 MED ORDER — LISINOPRIL 10 MG PO TABS
20.0000 mg | ORAL_TABLET | Freq: Every day | ORAL | Status: DC
  Administered 2012-04-28 – 2012-04-29 (×2): 20 mg via ORAL
  Filled 2012-04-28 (×2): qty 2

## 2012-04-28 MED ORDER — INFLUENZA VIRUS VACC SPLIT PF IM SUSP
0.5000 mL | INTRAMUSCULAR | Status: DC
  Filled 2012-04-28 (×2): qty 0.5

## 2012-04-28 MED ORDER — HYDROCODONE-ACETAMINOPHEN 5-325 MG PO TABS
1.0000 | ORAL_TABLET | Freq: Four times a day (QID) | ORAL | Status: DC | PRN
  Administered 2012-04-28 – 2012-04-29 (×2): 1 via ORAL
  Filled 2012-04-28 (×3): qty 1

## 2012-04-28 MED ORDER — ALBUTEROL SULFATE (5 MG/ML) 0.5% IN NEBU: 2.5000 mg | INHALATION_SOLUTION | RESPIRATORY_TRACT | Status: DC | PRN

## 2012-04-28 MED ORDER — NICOTINE 14 MG/24HR TD PT24
14.0000 mg | MEDICATED_PATCH | Freq: Every day | TRANSDERMAL | Status: DC
  Administered 2012-04-28 – 2012-04-29 (×2): 14 mg via TRANSDERMAL
  Filled 2012-04-28 (×2): qty 1

## 2012-04-28 MED ORDER — PNEUMOCOCCAL VAC POLYVALENT 25 MCG/0.5ML IJ INJ
0.5000 mL | INJECTION | INTRAMUSCULAR | Status: DC
  Filled 2012-04-28 (×2): qty 0.5

## 2012-04-28 MED ORDER — ALPRAZOLAM 0.25 MG PO TABS
0.2500 mg | ORAL_TABLET | Freq: Three times a day (TID) | ORAL | Status: DC | PRN
  Administered 2012-04-28 – 2012-04-29 (×2): 0.25 mg via ORAL
  Filled 2012-04-28 (×2): qty 1

## 2012-04-28 MED ORDER — ONDANSETRON HCL 4 MG/2ML IJ SOLN: 4.0000 mg | Freq: Four times a day (QID) | INTRAMUSCULAR | Status: DC | PRN

## 2012-04-28 MED ORDER — CLONIDINE HCL 0.1 MG PO TABS
0.1000 mg | ORAL_TABLET | Freq: Once | ORAL | Status: AC
  Administered 2012-04-28: 0.1 mg via ORAL
  Filled 2012-04-28: qty 1

## 2012-04-28 MED ORDER — SODIUM CHLORIDE 0.9 % IV BOLUS (SEPSIS)
1000.0000 mL | Freq: Once | INTRAVENOUS | Status: AC
  Administered 2012-04-28: 1000 mL via INTRAVENOUS

## 2012-04-28 MED ORDER — SODIUM CHLORIDE 0.9 % IJ SOLN: 3.0000 mL | Freq: Two times a day (BID) | INTRAMUSCULAR | Status: DC

## 2012-04-28 MED ORDER — FOLIC ACID 1 MG PO TABS
1.0000 mg | ORAL_TABLET | Freq: Every day | ORAL | Status: DC
  Administered 2012-04-28 – 2012-04-29 (×2): 1 mg via ORAL
  Filled 2012-04-28 (×2): qty 1

## 2012-04-28 MED ORDER — ACETAMINOPHEN 650 MG RE SUPP: 650.0000 mg | Freq: Four times a day (QID) | RECTAL | Status: DC | PRN

## 2012-04-28 MED ORDER — NAPHAZOLINE HCL 0.1 % OP SOLN
1.0000 [drp] | Freq: Four times a day (QID) | OPHTHALMIC | Status: DC | PRN
  Filled 2012-04-28: qty 15

## 2012-04-28 MED ORDER — METOPROLOL SUCCINATE ER 50 MG PO TB24
50.0000 mg | ORAL_TABLET | Freq: Every day | ORAL | Status: DC
  Administered 2012-04-28 – 2012-04-29 (×2): 50 mg via ORAL
  Filled 2012-04-28 (×2): qty 1

## 2012-04-28 NOTE — H&P (Signed)
[facility] History and Physical  Brittany Blair MWN:[id number] DOB: 20-Jul-1960 DOA: 04/28/2012   PCP: Does not have one Specialists: Dr. Mariel Sleet is her oncologist  Chief Complaint: Drug overdose  HPI: Brittany Blair is a 52 y.o. female with a medical history of breast cancer, status post surgery, chemotherapy and radiation. She's currently on maintenance treatment. She also has a history of hypertension, and generalized anxiety disorder with depression as well. She was apparently in her usual state of health till about 10:30 or 11 PM last night. Her husband got a call from the sher[employer] and saying that his wife was talking with her daughter, and telling her to come over to see her before she "goes." The husband went to check on his wife and he found her on the kitchen floor. She thinks she may have passed out, but is not very sure. Denies any injuries. Did have a headache earlier, but none currently. Has chronic back pain. Also, has chronic left-sided chest pain at the surgical site. Apparently, she took about 6 tablets of Xanax 0.5 mg each. And she may have taken up to 5 tablets of Vicodin 5/325mg  each. She also had a bottle and a half of wine last night. The husband checked the patient's blood pressure and it was 110/80 and he repeated it after a while and it was 80/50. He got concerned and he called EMS. Patient was difficult to arouse to him. She denies any new chest pains. Denies any discomfort. She tells me that she is usually very anxious. Patient is currently drowsy but arousable. History is very limited from her.  Home Medications: Medication list needs to be verified Prior to Admission medications   Medication Sig Start Date End Date Taking? Authorizing Brittany Blair  acyclovir (ZOVIRAX) 400 MG tablet Take 400 mg by mouth daily.     Historical Brittany Willcox, MD  albuterol-ipratropium (COMBIVENT) 18-103 MCG/ACT inhaler Inhale 2 puffs into the lungs every 6 (six) hours as needed for  wheezing. 11/04/11 11/03/12  Brittany Crane, MD  ALPRAZolam Prudy Feeler) 0.5 MG tablet Take 1 tablet (0.5 mg total) by mouth 3 (three) times daily as needed. 04/27/12   Brittany An, MD  amoxicillin-clavulanate (AUGMENTIN) 875-125 MG per tablet Take 1 tablet by mouth 2 (two) times daily. 02/10/12   Brittany Newer, PA  anastrozole (ARIMIDEX) 1 MG tablet Take 1 tablet (1 mg total) by mouth daily. 08/16/11   Brittany Crane, MD  goserelin (ZOLADEX) 3.6 MG injection Inject 3.6 mg into the skin every 28 (twenty-eight) days. Last dose 11/29    Historical Brittany Callejo, MD  hydrochlorothiazide (MICROZIDE) 12.5 MG capsule Take 1 capsule (12.5 mg total) by mouth daily. 09/27/11 09/26/12  Brittany Barrio, Blair  HYDROcodone-acetaminophen (NORCO/VICODIN) 5-325 MG per tablet Take 1 tablet by mouth every 6 (six) hours as needed for pain. 12/27/11   Brittany Crane, MD  lisinopril (PRINIVIL,ZESTRIL) 20 MG tablet Take 1 tablet (20 mg total) by mouth daily. Patient to establish care with a local primary MD. 04/01/12   Brittany Newer, PA  loratadine (CLARITIN) 10 MG tablet Take 10 mg by mouth daily.      Historical Brittany Andrada, MD  metoprolol succinate (TOPROL XL) 50 MG 24 hr tablet Take 1 tablet (50 mg total) by mouth daily. Take with or immediately following a meal. 12/27/11   Brittany Crane, MD  venlafaxine XR (EFFEXOR-XR) 75 MG 24 hr capsule Take 1 capsule (75 mg total) by mouth daily. 12/27/11 12/26/12  Brittany Crane, MD  Allergies:  Allergies  Allergen Reactions  . Bee Venom Anaphylaxis    Past Medical History: Past Medical History  Diagnosis Date  . Anxiety   . Herpes   . MRSA (methicillin resistant Staphylococcus aureus)   . Breast lump     left  . Hypertension   . Hematoma   . Swelling     left foot  . Multiple blisters     along surgical site  . Breast cancer     Past Surgical History  Procedure Laterality Date  . Tubal ligation    . Wisdom tooth extraction    . Mastectomy modified radical  10/10/10     left -Dr Jamey Ripa     Social History:  reports that she has been smoking.  She does not have any smokeless tobacco history on file. She reports that she drinks about 12.6 ounces of alcohol per week. She reports that she does not use illicit drugs.  Living Situation: Lives with her husband Activity Level: Usually independent with daily activities   Family History:  Family History  Problem Relation Age of Onset  . Heart disease Father     heart attack     Review of Systems - Unable to obtain due to altered mentation  Physical Examination  Filed Vitals:   04/28/12 0300 04/28/12 0400 04/28/12 0411 04/28/12 0445  BP: 96/72 85/47 91/56    Pulse: 94 89 90 92  Temp:      TempSrc:      Resp: 16 19 22 9   SpO2: 99% 99% 100% 100%    General appearance: drowsy, arousable. appears stated age and no distress Head: Normocephalic, without obvious abnormality, atraumatic Eyes: conjunctivae/corneas clear. PERRL, EOM's intact. Throat: lips, mucosa, and tongue normal; teeth and gums normal Neck: no adenopathy, no carotid bruit, no JVD, supple, symmetrical, trachea midline and thyroid not enlarged, symmetric, no tenderness/mass/nodules Resp: clear to auscultation bilaterally with decreased air entry at bases. Chest wall: left sided chest wall tenderness.  Cardio: regular rate and rhythm, S1, S2 normal, no murmur, click, rub or gallop GI: soft, non-tender; bowel sounds normal; no masses,  no organomegaly Extremities: extremities normal, atraumatic, no cyanosis or edema Pulses: 2+ and symmetric Skin: Skin changes with hyperpigmentation at left chest. Lymph nodes: Cervical, supraclavicular, and axillary nodes normal. Neurologic: Drowsy. Follows commands. Moving all extremities. No focal deficits  Laboratory Data: Results for orders placed during the hospital encounter of 04/28/12 (from the past 48 hour(s))  CBC WITH DIFFERENTIAL     Status: Abnormal   Collection Time    04/28/12  2:33 AM      Result Value Range    WBC 7.0  4.0 - 10.5 K/uL   RBC 3.95  3.87 - 5.11 MIL/uL   Hemoglobin 13.6  12.0 - 15.0 g/dL   HCT 45.4  09.8 - 11.9 %   MCV 101.5 (*) 78.0 - 100.0 fL   MCH 34.4 (*) 26.0 - 34.0 pg   MCHC 33.9  30.0 - 36.0 g/dL   RDW 14.7  82.9 - 56.2 %   Platelets 315  150 - 400 K/uL   Neutrophils Relative 55  43 - 77 %   Neutro Abs 3.8  1.7 - 7.7 K/uL   Lymphocytes Relative 32  12 - 46 %   Lymphs Abs 2.3  0.7 - 4.0 K/uL   Monocytes Relative 8  3 - 12 %   Monocytes Absolute 0.6  0.1 - 1.0 K/uL   Eosinophils Relative 5  0 -  5 %   Eosinophils Absolute 0.4  0.0 - 0.7 K/uL   Basophils Relative 0  0 - 1 %   Basophils Absolute 0.0  0.0 - 0.1 K/uL  COMPREHENSIVE METABOLIC PANEL     Status: Abnormal   Collection Time    04/28/12  2:33 AM      Result Value Range   Sodium 135  135 - 145 mEq/L   Potassium 4.4  3.5 - 5.1 mEq/L   Chloride 97  96 - 112 mEq/L   CO2 20  19 - 32 mEq/L   Glucose, Bld 90  70 - 99 mg/dL   BUN 12  6 - 23 mg/dL   Creatinine, Ser 1.30  0.50 - 1.10 mg/dL   Calcium 9.0  8.4 - 86.5 mg/dL   Total Protein 7.9  6.0 - 8.3 g/dL   Albumin 4.1  3.5 - 5.2 g/dL   AST 49 (*) 0 - 37 U/L   ALT 40 (*) 0 - 35 U/L   Alkaline Phosphatase 98  39 - 117 U/L   Total Bilirubin 0.2 (*) 0.3 - 1.2 mg/dL   GFR calc non Af Amer >90  >90 mL/min   GFR calc Af Amer >90  >90 mL/min   Comment:            The eGFR has been calculated     using the CKD EPI equation.     This calculation has not been     validated in all clinical     situations.     eGFR's persistently     <90 mL/min signify     possible Chronic Kidney Disease.  ACETAMINOPHEN LEVEL     Status: None   Collection Time    04/28/12  2:33 AM      Result Value Range   Acetaminophen (Tylenol), Serum 22.1  10 - 30 ug/mL   Comment:            THERAPEUTIC CONCENTRATIONS VARY     SIGNIFICANTLY. A RANGE OF 10-30     ug/mL MAY BE Blair EFFECTIVE     CONCENTRATION FOR MANY PATIENTS.     HOWEVER, SOME ARE BEST TREATED     AT CONCENTRATIONS OUTSIDE  THIS     RANGE.     ACETAMINOPHEN CONCENTRATIONS     >150 ug/mL AT 4 HOURS AFTER     INGESTION AND >50 ug/mL AT 12     HOURS AFTER INGESTION ARE     OFTEN ASSOCIATED WITH TOXIC     REACTIONS.  ETHANOL     Status: Abnormal   Collection Time    04/28/12  2:33 AM      Result Value Range   Alcohol, Ethyl (B) 271 (*) 0 - 11 mg/dL   Comment:            LOWEST DETECTABLE LIMIT FOR     SERUM ALCOHOL IS 11 mg/dL     FOR MEDICAL PURPOSES ONLY  SALICYLATE LEVEL     Status: Abnormal   Collection Time    04/28/12  2:33 AM      Result Value Range   Salicylate Lvl <2.0 (*) 2.8 - 20.0 mg/dL  URINALYSIS, MICROSCOPIC ONLY     Status: Abnormal   Collection Time    04/28/12  3:13 AM      Result Value Range   Color, Urine STRAW (*) YELLOW   APPearance CLEAR  CLEAR   Specific Gravity, Urine 1.010  1.005 - 1.030  pH 5.5  5.0 - 8.0   Glucose, UA NEGATIVE  NEGATIVE mg/dL   Hgb urine dipstick NEGATIVE  NEGATIVE   Bilirubin Urine NEGATIVE  NEGATIVE   Ketones, ur NEGATIVE  NEGATIVE mg/dL   Protein, ur NEGATIVE  NEGATIVE mg/dL   Urobilinogen, UA 0.2  0.0 - 1.0 mg/dL   Nitrite NEGATIVE  NEGATIVE   Leukocytes, UA NEGATIVE  NEGATIVE   Bacteria, UA FEW (*) RARE   Squamous Epithelial / LPF FEW (*) RARE   Casts HYALINE CASTS (*) NEGATIVE  URINE RAPID DRUG SCREEN (HOSP PERFORMED)     Status: Abnormal   Collection Time    04/28/12  3:13 AM      Result Value Range   Opiates POSITIVE (*) NONE DETECTED   Cocaine NONE DETECTED  NONE DETECTED   Benzodiazepines POSITIVE (*) NONE DETECTED   Amphetamines NONE DETECTED  NONE DETECTED   Tetrahydrocannabinol NONE DETECTED  NONE DETECTED   Barbiturates NONE DETECTED  NONE DETECTED   Comment:            DRUG SCREEN FOR MEDICAL PURPOSES     ONLY.  IF CONFIRMATION IS NEEDED     FOR ANY PURPOSE, NOTIFY LAB     WITHIN 5 DAYS.                LOWEST DETECTABLE LIMITS     FOR URINE DRUG SCREEN     Drug Class       Cutoff (ng/mL)     Amphetamine      1000      Barbiturate      200     Benzodiazepine   200     Tricyclics       300     Opiates          300     Cocaine          300     THC              50    Radiology Reports: No results found.  Electrocardiogram: Sinus rhythm at 93 beats per minute. Normal axis. Intervals are normal. No Q-wave. No acute ST changes.   Problem List  Principal Problem:   Benzodiazepine overdose Active Problems:   Breast Cancer, IDC,Left, central, Stage II, receptor + Her2 -   Syncope   GAD (generalized anxiety disorder)   Chronic chest pain   Depression   Hypotension due to drugs   Assessment: This is a 52 year old, Caucasian female, with a history of breast cancer, anxiety, who apparently overdosed on benzodiazepine and Vicodin. She has mildly abnormal liver function tests well. She was hypotensive in the ED.  Plan: #1 syncope secondary to overdose: We will check a CT head to make sure there is no intracranial trauma. Doesn't seem to have any other body injuries at this time.  #2 hypoxia: Most likely due to poor respiratory effort: Oxygenation improved immediately after she was placed on oxygen nasal cannula. We will check a chest x-ray.  #3 intentional overdose: Most likely secondary to depression. She will be monitored in the step down unit due to borderline hypotension. Behavioral health team will be asked to see her. Supportive measures will be provided. Sitter will be utilized.  #4 hypotension due to drugs: Normal saline bolus will be given. She'll be monitored closely in the step down unit. Blood pressure tends to drop when she is sleeping. Has improved now that she is a little bit more arousable.  #  5 mild transaminitis: Initial acetaminophen level was normal. It was not quite a 4 hour level. We will repeat another level. Monitor liver function test.  #6 history of breast cancer: Continue with her maintenance treatment. She has chronic pain in the left side of the chest, where she had surgery and  radiation.  #7 history of generalized anxiety disorder and depression: We'll hold her anxiolytic agents due to the overdose. Behavioral health team will be consulted.  #8 History of daily Alcohol use: Thiamine. No ativan due to benzo OD. Monitor closely.   #9 History of Tobacco Abuse: Utilize Nicotine patch.   DVT Prophylaxis: Lovenox Code Status: She is a full code Family Communication: Discussed with the patient and husband at bedside  Disposition Plan: Unclear for now   Further management decisions will depend on results of further testing and patient's response to treatment.  [facility]  [facility] Pager [phone]  If 7PM-7AM, please contact night-coverage [email or web address] Password [facility]  04/28/2012, 5:37 AM

## 2012-04-28 NOTE — Progress Notes (Signed)
Spoke to [place] from ACT team. He is aware that pt needs to be seen.
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
## 2012-04-28 NOTE — ED Notes (Signed)
Per pt's husband pt drank 1.5 bottles of wine, took 5-7 xanax and hydrocodone 4-5.

## 2012-04-28 NOTE — ED Provider Notes (Signed)
History     CSN: [id number]  Arrival date & time 04/28/12  0213   First MD Initiated Contact with Patient 04/28/12 0234      Chief Complaint  Patient presents with  . Drug Overdose    (Consider location/radiation/quality/duration/timing/severity/associated sxs/prior treatment) HPI.   Level V caveat for drug overdose.   Patient allegedly drank a large amount of alcohol, consumed several Xanax and several Vicodin.  History obtained from husband. She's been depressed over her diagnosis of breast cancer and remote deaths of some relatives.  patient transported by EMS  Past Medical History  Diagnosis Date  . Anxiety   . Herpes   . MRSA (methicillin resistant Staphylococcus aureus)   . Breast lump     left  . Hypertension   . Hematoma   . Swelling     left foot  . Multiple blisters     along surgical site  . Breast cancer     Past Surgical History  Procedure Laterality Date  . Tubal ligation    . Wisdom tooth extraction    . Mastectomy modified radical  10/10/10     left -Dr Jamey Ripa    Family History  Problem Relation Age of Onset  . Heart disease Father     heart attack    History  Substance Use Topics  . Smoking status: Current Every Day Smoker -- 0.25 packs/day  . Smokeless tobacco: Not on file  . Alcohol Use: 12.6 oz/week    21 Glasses of wine per week    OB History   Grav Para Term Preterm Abortions TAB SAB Ect Mult Living                  Review of Systems  Unable to perform ROS: Acuity of condition    Allergies  Bee venom  Home Medications   Current Outpatient Rx  Name  Route  Sig  Dispense  Refill  . acyclovir (ZOVIRAX) 400 MG tablet   Oral   Take 400 mg by mouth daily.          Marland Kitchen albuterol-ipratropium (COMBIVENT) 18-103 MCG/ACT inhaler   Inhalation   Inhale 2 puffs into the lungs every 6 (six) hours as needed for wheezing.   1 Inhaler   1   . ALPRAZolam (XANAX) 0.5 MG tablet   Oral   Take 1 tablet (0.5 mg total) by mouth 3 (three)  times daily as needed.   90 tablet   3   . amoxicillin-clavulanate (AUGMENTIN) 875-125 MG per tablet   Oral   Take 1 tablet by mouth 2 (two) times daily.   20 tablet   0   . anastrozole (ARIMIDEX) 1 MG tablet   Oral   Take 1 tablet (1 mg total) by mouth daily.   30 tablet   11   . goserelin (ZOLADEX) 3.6 MG injection   Subcutaneous   Inject 3.6 mg into the skin every 28 (twenty-eight) days. Last dose 11/29         . hydrochlorothiazide (MICROZIDE) 12.5 MG capsule   Oral   Take 1 capsule (12.5 mg total) by mouth daily.   30 capsule   1   . HYDROcodone-acetaminophen (NORCO/VICODIN) 5-325 MG per tablet   Oral   Take 1 tablet by mouth every 6 (six) hours as needed for pain.   100 tablet   0   . lisinopril (PRINIVIL,ZESTRIL) 20 MG tablet   Oral   Take 1 tablet (20 mg total) by  mouth daily. Patient to establish care with a local primary MD.   30 tablet   1   . loratadine (CLARITIN) 10 MG tablet   Oral   Take 10 mg by mouth daily.           . metoprolol succinate (TOPROL XL) 50 MG 24 hr tablet   Oral   Take 1 tablet (50 mg total) by mouth daily. Take with or immediately following a meal.   30 tablet   11   . venlafaxine XR (EFFEXOR-XR) 75 MG 24 hr capsule   Oral   Take 1 capsule (75 mg total) by mouth daily.   60 capsule   3     BP 128/100  Pulse 100  Temp(Src) 96.8 F (36 C) (Rectal)  SpO2 87%  LMP 06/13/2010  Physical Exam  Nursing note and vitals reviewed. Constitutional:  Patient opens eyes to direct questions but does not answer.  HENT:  Head: Normocephalic and atraumatic.  Eyes: Conjunctivae and EOM are normal. Pupils are equal, round, and reactive to light.  Neck: Normal range of motion. Neck supple.  Cardiovascular: Normal rate, regular rhythm and normal heart sounds.   Pulmonary/Chest: Effort normal and breath sounds normal.  Abdominal: Soft. Bowel sounds are normal.  Musculoskeletal:  unable  Neurological:  unable  Skin: Skin is  warm and dry.  Psychiatric:  unable    ED Course  Procedures (including critical care time)  Labs Reviewed  CBC WITH DIFFERENTIAL - Abnormal; Notable for the following:    MCV 101.5 (*)    MCH 34.4 (*)    All other components within normal limits  COMPREHENSIVE METABOLIC PANEL - Abnormal; Notable for the following:    AST 49 (*)    ALT 40 (*)    Total Bilirubin 0.2 (*)    All other components within normal limits  ETHANOL - Abnormal; Notable for the following:    Alcohol, Ethyl (B) 271 (*)    All other components within normal limits  SALICYLATE LEVEL - Abnormal; Notable for the following:    Salicylate Lvl <2.0 (*)    All other components within normal limits  URINALYSIS, MICROSCOPIC ONLY - Abnormal; Notable for the following:    Color, Urine STRAW (*)    Bacteria, UA FEW (*)    Squamous Epithelial / LPF FEW (*)    Casts HYALINE CASTS (*)    All other components within normal limits  URINE RAPID DRUG SCREEN (HOSP PERFORMED) - Abnormal; Notable for the following:    Opiates POSITIVE (*)    Benzodiazepines POSITIVE (*)    All other components within normal limits  ACETAMINOPHEN LEVEL   No results found.   No diagnosis found.   Date: 04/28/2012  Rate: 95  Rhythm: normal sinus rhythm  QRS Axis: normal  Intervals: normal  ST/T Wave abnormalities: normal  Conduction Disutrbances: none  Narrative Interpretation: unremarkable  CRITICAL CARE Performed by: Donnetta Hutching   Total critical care time: 30  Critical care time was exclusive of separately billable procedures and treating other patients.  Critical care was necessary to treat or prevent imminent or life-threatening deterioration.  Critical care was time spent personally by me on the following activities: development of treatment plan with patient and/or surrogate as well as nursing, discussions with consultants, evaluation of patient's response to treatment, examination of patient, obtaining history from patient  or surrogate, ordering and performing treatments and interventions, ordering and review of laboratory studies, ordering and review of radiographic studies, pulse  oximetry and re-evaluation of patient's condition.   MDM  Patient will need medical admission to stabilize prior to psychiatric evaluation.   Alcohol level 271.   Drug screen positive for opiates and benzodiazepine.  Vital signs have been stable        Donnetta Hutching, MD 04/28/12 [phone]

## 2012-04-28 NOTE — Progress Notes (Addendum)
Pt consents to transfer to [facility]. All questions were answered. IV d/c. Foley d/c at 4pm, pt just voided well, no complications. Pts family is very supportive, at bedside & plan to go with her at discharge to [facility]. Report called to [place], Charity fundraiser at [facility]. Report called to [facility], they are on the way to pick up pt at this time. Sheryn Bison

## 2012-04-28 NOTE — Progress Notes (Addendum)
Pt accepted by Verne Spurr, NP to Dr. Daleen Bo  Room [phone] per Letitia Libra.  Called Pt's  Nurse, Abby and informed of acceptance. Abby will have pt to sign voluntary admission form and call [facility] to transport.

## 2012-04-28 NOTE — ED Notes (Signed)
Patient placed on oxygen via nasal canula at 2 liters. Room air oxygen saturation of 87%. Oxygen saturation of 98% on 2 liters via [place] at this time.

## 2012-04-28 NOTE — Progress Notes (Signed)
Patient admitted after midnight. Patient interviewed and examined. Chart reviewed. Blood pressure normal. Patient now alert and talkative. She is very tearful. She reports problems with isolation after her cancer diagnosis. She also voices concern about her change to Dr. Laurie Panda, and whether or not he will prescribe her psychiatric medications. She admits to drinking alcohol yesterday and taking Xanax and Vicodin as outlined in the history and physical. She denies overdose of anything else. She is quite tearful and dependent. Will discontinue telemetry, saline lock IV. Discontinue Foley catheter. Advance diet. Continue one-to-one suicide precautions. Transferred to the floor. Have discussed with the act team a. Patient stable for discharge when appropriate disposition arranged.

## 2012-04-28 NOTE — Discharge Summary (Signed)
Physician Discharge Summary  Patient ID: Brittany Blair MRN: [id number] DOB/AGE: 02/28/1960 52 y.o.  Admit date: 04/28/2012 Discharge date: 04/28/2012  Discharge Diagnoses:  Principal Problem:   Benzodiazepine overdose Active Problems:   Breast Cancer, IDC,Left, central, Stage II, receptor + Her2 -   Syncope   GAD (generalized anxiety disorder)   Chronic chest pain   Depression   Hypotension due to drugs   Transaminitis  Medications per psychiatry       Future Appointments [facility] Department Dept Phone   05/22/2012 12:00 PM Randall An, MD [facility] [phone]   05/22/2012 12:30 PM [facility] [phone]     Disposition: 01-Home or Self Care  Discharged Condition: medically stable  Consults:  ACT team  Labs:   Results for orders placed during the hospital encounter of 04/28/12 (from the past 48 hour(s))  CBC WITH DIFFERENTIAL     Status: Abnormal   Collection Time    04/28/12  2:33 AM      Result Value Range   WBC 7.0  4.0 - 10.5 K/uL   RBC 3.95  3.87 - 5.11 MIL/uL   Hemoglobin 13.6  12.0 - 15.0 g/dL   HCT 65.7  84.6 - 96.2 %   MCV 101.5 (*) 78.0 - 100.0 fL   MCH 34.4 (*) 26.0 - 34.0 pg   MCHC 33.9  30.0 - 36.0 g/dL   RDW 95.2  84.1 - 32.4 %   Platelets 315  150 - 400 K/uL   Neutrophils Relative 55  43 - 77 %   Neutro Abs 3.8  1.7 - 7.7 K/uL   Lymphocytes Relative 32  12 - 46 %   Lymphs Abs 2.3  0.7 - 4.0 K/uL   Monocytes Relative 8  3 - 12 %   Monocytes Absolute 0.6  0.1 - 1.0 K/uL   Eosinophils Relative 5  0 - 5 %   Eosinophils Absolute 0.4  0.0 - 0.7 K/uL   Basophils Relative 0  0 - 1 %   Basophils Absolute 0.0  0.0 - 0.1 K/uL  COMPREHENSIVE METABOLIC PANEL     Status: Abnormal   Collection Time    04/28/12  2:33 AM      Result Value Range   Sodium 135  135 - 145 mEq/L   Potassium 4.4  3.5 - 5.1 mEq/L   Chloride 97  96 - 112 mEq/L   CO2 20  19 - 32 mEq/L   Glucose, Bld 90  70 - 99 mg/dL   BUN 12  6  - 23 mg/dL   Creatinine, Ser 4.01  0.50 - 1.10 mg/dL   Calcium 9.0  8.4 - 02.7 mg/dL   Total Protein 7.9  6.0 - 8.3 g/dL   Albumin 4.1  3.5 - 5.2 g/dL   AST 49 (*) 0 - 37 U/L   ALT 40 (*) 0 - 35 U/L   Alkaline Phosphatase 98  39 - 117 U/L   Total Bilirubin 0.2 (*) 0.3 - 1.2 mg/dL   GFR calc non Af Amer >90  >90 mL/min   GFR calc Af Amer >90  >90 mL/min   Comment:            The eGFR has been calculated     using the CKD EPI equation.     This calculation has not been     validated in all clinical     situations.     eGFR's persistently     <  90 mL/min signify     possible Chronic Kidney Disease.  ACETAMINOPHEN LEVEL     Status: None   Collection Time    04/28/12  2:33 AM      Result Value Range   Acetaminophen (Tylenol), Serum 22.1  10 - 30 ug/mL   Comment:            THERAPEUTIC CONCENTRATIONS VARY     SIGNIFICANTLY. A RANGE OF 10-30     ug/mL MAY BE AN EFFECTIVE     CONCENTRATION FOR MANY PATIENTS.     HOWEVER, SOME ARE BEST TREATED     AT CONCENTRATIONS OUTSIDE THIS     RANGE.     ACETAMINOPHEN CONCENTRATIONS     >150 ug/mL AT 4 HOURS AFTER     INGESTION AND >50 ug/mL AT 12     HOURS AFTER INGESTION ARE     OFTEN ASSOCIATED WITH TOXIC     REACTIONS.  ETHANOL     Status: Abnormal   Collection Time    04/28/12  2:33 AM      Result Value Range   Alcohol, Ethyl (B) 271 (*) 0 - 11 mg/dL   Comment:            LOWEST DETECTABLE LIMIT FOR     SERUM ALCOHOL IS 11 mg/dL     FOR MEDICAL PURPOSES ONLY  SALICYLATE LEVEL     Status: Abnormal   Collection Time    04/28/12  2:33 AM      Result Value Range   Salicylate Lvl <2.0 (*) 2.8 - 20.0 mg/dL  URINALYSIS, MICROSCOPIC ONLY     Status: Abnormal   Collection Time    04/28/12  3:13 AM      Result Value Range   Color, Urine STRAW (*) YELLOW   APPearance CLEAR  CLEAR   Specific Gravity, Urine 1.010  1.005 - 1.030   pH 5.5  5.0 - 8.0   Glucose, UA NEGATIVE  NEGATIVE mg/dL   Hgb urine dipstick NEGATIVE  NEGATIVE    Bilirubin Urine NEGATIVE  NEGATIVE   Ketones, ur NEGATIVE  NEGATIVE mg/dL   Protein, ur NEGATIVE  NEGATIVE mg/dL   Urobilinogen, UA 0.2  0.0 - 1.0 mg/dL   Nitrite NEGATIVE  NEGATIVE   Leukocytes, UA NEGATIVE  NEGATIVE   Bacteria, UA FEW (*) RARE   Squamous Epithelial / LPF FEW (*) RARE   Casts HYALINE CASTS (*) NEGATIVE  URINE RAPID DRUG SCREEN (HOSP PERFORMED)     Status: Abnormal   Collection Time    04/28/12  3:13 AM      Result Value Range   Opiates POSITIVE (*) NONE DETECTED   Cocaine NONE DETECTED  NONE DETECTED   Benzodiazepines POSITIVE (*) NONE DETECTED   Amphetamines NONE DETECTED  NONE DETECTED   Tetrahydrocannabinol NONE DETECTED  NONE DETECTED   Barbiturates NONE DETECTED  NONE DETECTED   Comment:            DRUG SCREEN FOR MEDICAL PURPOSES     ONLY.  IF CONFIRMATION IS NEEDED     FOR ANY PURPOSE, NOTIFY LAB     WITHIN 5 DAYS.                LOWEST DETECTABLE LIMITS     FOR URINE DRUG SCREEN     Drug Class       Cutoff (ng/mL)     Amphetamine      1000     Barbiturate  200     Benzodiazepine   200     Tricyclics       300     Opiates          300     Cocaine          300     THC              50  MRSA PCR SCREENING     Status: None   Collection Time    04/28/12  8:08 AM      Result Value Range   MRSA by PCR NEGATIVE  NEGATIVE   Comment:            The GeneXpert MRSA Assay (FDA     approved for NASAL specimens     only), is one component of a     comprehensive MRSA colonization     surveillance program. It is not     intended to diagnose MRSA     infection nor to guide or     monitor treatment for     MRSA infections.    Diagnostics:  Dg Chest 1 View  04/28/2012  *RADIOLOGY REPORT*  Clinical Data: Hypoxia; status post fall.  History of smoking.  CHEST - 1 VIEW  Comparison: Chest radiograph performed 11/04/2011  Findings: The lungs are well-aerated and clear.  There is no evidence of focal opacification, pleural effusion or pneumothorax.  The  cardiomediastinal silhouette is within normal limits.  No acute osseous abnormalities are seen.  IMPRESSION: No acute cardiopulmonary process seen.  No displaced rib fractures identified.   Original Report Authenticated By: Tonia Ghent, M.D.    Ct Head Wo Contrast  04/28/2012  *RADIOLOGY REPORT*  Clinical Data: Syncope; fall.  CT HEAD WITHOUT CONTRAST  Technique:  Contiguous axial images were obtained from the base of the skull through the vertex without contrast.  Comparison: None.  Findings: There is no evidence of acute infarction, mass lesion, or intra- or extra-axial hemorrhage on CT.  Mild periventricular and subcortical white matter change raises question for small vessel ischemic microangiopathy.  The posterior fossa, including the cerebellum, brainstem and fourth ventricle, is within normal limits.  The third and lateral ventricles, and basal ganglia are unremarkable in appearance.  The cerebral hemispheres demonstrate grossly normal gray-white differentiation.  No mass effect or midline shift is seen.  There is no evidence of fracture; visualized osseous structures are unremarkable in appearance.  The visualized portions of the orbits are within normal limits.  There is partial opacification of the sphenoid sinus, and mucosal thickening within the ethmoid air cells.  The remaining paranasal sinuses and mastoid air cells are well-aerated.  No significant soft tissue abnormalities are seen.  IMPRESSION:  1.  No evidence of traumatic intracranial injury or fracture. 2.  Mild small vessel ischemic microangiopathy. 3.  Partial opacification of the sphenoid sinus, and mucosal thickening in the ethmoid air cells.   Original Report Authenticated By: Tonia Ghent, M.D.    EKG: NSR  Full Code   Hospital Course: See H&P for complete admission detail.  The patient is a 52 y.o. white female with history of depression and breast cancer. She had been drinking and reportedly called her daughter and told her to  come over to see her "before she goes". She had taken 5 tablets of Vicodin 5/325. She drank a bottle and a half of wine. She took 6 Xanax tablets 0.5 mg. Her husband reportedly got a cold from  the sheriff department and went to check on his wife. She was on the kitchen floor. She was lethargic. The husband checked her blood pressure and it range between 80 and 100 systolic. He called EMS. Initially, patient was unable to provide much history. Blood pressure in the emergency range at 85/47-96/72. Vital signs otherwise unremarkable. She was drowsy but arousable and able to protect her airway. Acetaminophen level was 22. Blood alcohol level 271. Urine drug screen positive for opiates and benzodiazepines. Salicylate level undetectable. CT brain negative. Chest x-ray negative.  Patient was given IV fluids. Monitored and step down with one-to-one suicide precautions. Patient woke up and admitted to taking the above-mentioned substances, but denied having taken anything else. She reports that she has been very sad and worried. She is concerned that she may be unable to continue her Effexor and Xanax, do to changing physicians. She reports that these medications have "changed her life". She feels very isolated since she was diagnosed with cancer, that she and her husband's life has come to a standstill due to the cancer. She does not see a psychiatrist. She has no previous history of overdose. She has been medically cleared and is interested in getting help for her emotional pain. She has been seen by the act team and transferred to behavioral Health Center has been arranged. Her blood pressure has remained normal. We have held all of her antihypertensives.  Discharge Exam:  Blood pressure 97/64, pulse 82, temperature 97.9 F (36.6 C), temperature source Oral, resp. rate 20, height 5\' 4"  (1.626 m), weight 87 kg (191 lb 12.8 oz), last menstrual period 06/13/2010, SpO2 100.00%.  Gen:  Tearful, alert, oriented Lungs:   CTA without WRR CV RRR without MGR Ext No CCE  Psych:  Cooperative, tremulous. tearful  Signed: SULLIVAN,CORINNA L 04/28/2012, 3:58 PM

## 2012-04-28 NOTE — Plan of Care (Signed)
Problem: Phase I Progression Outcomes Goal: Voiding-avoid urinary catheter unless indicated Outcome: Completed/Met Date Met:  04/28/12 Foley d/c this afternoon

## 2012-04-29 ENCOUNTER — Inpatient Hospital Stay (HOSPITAL_COMMUNITY)
Admission: AD | Admit: 2012-04-29 | Discharge: 2012-05-04 | DRG: 885 | Disposition: A | Discharge status: Home / Self Care | Payer: 59 | Source: Intra-hospital | Attending: Psychiatry | Admitting: Psychiatry

## 2012-04-29 ENCOUNTER — Encounter (HOSPITAL_COMMUNITY): Payer: Self-pay

## 2012-04-29 DIAGNOSIS — F332 Major depressive disorder, recurrent severe without psychotic features: Principal | ICD-10-CM | POA: Diagnosis present

## 2012-04-29 DIAGNOSIS — C50319 Malignant neoplasm of lower-inner quadrant of unspecified female breast: Secondary | ICD-10-CM

## 2012-04-29 DIAGNOSIS — Z79899 Other long term (current) drug therapy: Secondary | ICD-10-CM

## 2012-04-29 DIAGNOSIS — F329 Major depressive disorder, single episode, unspecified: Secondary | ICD-10-CM

## 2012-04-29 DIAGNOSIS — F411 Generalized anxiety disorder: Secondary | ICD-10-CM | POA: Diagnosis present

## 2012-04-29 DIAGNOSIS — G8929 Other chronic pain: Secondary | ICD-10-CM

## 2012-04-29 DIAGNOSIS — I10 Essential (primary) hypertension: Secondary | ICD-10-CM | POA: Diagnosis present

## 2012-04-29 DIAGNOSIS — F4310 Post-traumatic stress disorder, unspecified: Secondary | ICD-10-CM | POA: Diagnosis present

## 2012-04-29 DIAGNOSIS — C50119 Malignant neoplasm of central portion of unspecified female breast: Secondary | ICD-10-CM | POA: Diagnosis present

## 2012-04-29 LAB — CBC
HCT: 33 % — ABNORMAL LOW (ref 36.0–46.0)
Hemoglobin: 11.1 g/dL — ABNORMAL LOW (ref 12.0–15.0)
MCH: 33.6 pg (ref 26.0–34.0)
MCV: 100.3 fL — ABNORMAL HIGH (ref 78.0–100.0)
MCV: 99.4 fL (ref 78.0–100.0)
Platelets: 229 10*3/uL (ref 150–400)
Platelets: 238 10*3/uL (ref 150–400)
RBC: 3.29 MIL/uL — ABNORMAL LOW (ref 3.87–5.11)
RDW: 13.2 % (ref 11.5–15.5)
WBC: 4.5 10*3/uL (ref 4.0–10.5)

## 2012-04-29 LAB — COMPREHENSIVE METABOLIC PANEL
AST: 41 U/L — ABNORMAL HIGH (ref 0–37)
Albumin: 3.5 g/dL (ref 3.5–5.2)
Albumin: 3.9 g/dL (ref 3.5–5.2)
BUN: 8 mg/dL (ref 6–23)
Calcium: 9.3 mg/dL (ref 8.4–10.5)
Calcium: 9.5 mg/dL (ref 8.4–10.5)
Creatinine, Ser: 0.69 mg/dL (ref 0.50–1.10)
Creatinine, Ser: 0.66 mg/dL (ref 0.50–1.10)
Total Protein: 6.3 g/dL (ref 6.0–8.3)
Total Protein: 7.7 g/dL (ref 6.0–8.3)

## 2012-04-29 MED ORDER — NAPROXEN 500 MG PO TABS
500.0000 mg | ORAL_TABLET | Freq: Two times a day (BID) | ORAL | Status: DC
  Administered 2012-04-29 – 2012-05-04 (×10): 500 mg via ORAL
  Filled 2012-04-29 (×16): qty 1

## 2012-04-29 MED ORDER — ONDANSETRON 4 MG PO TBDP: 4.0000 mg | ORAL_TABLET | Freq: Four times a day (QID) | ORAL | Status: DC | PRN

## 2012-04-29 MED ORDER — ANASTROZOLE 1 MG PO TABS
1.0000 mg | ORAL_TABLET | Freq: Every day | ORAL | Status: DC
  Administered 2012-04-29 – 2012-05-04 (×6): 1 mg via ORAL
  Filled 2012-04-29 (×8): qty 1

## 2012-04-29 MED ORDER — ACETAMINOPHEN 325 MG PO TABS
650.0000 mg | ORAL_TABLET | Freq: Four times a day (QID) | ORAL | Status: DC | PRN
  Administered 2012-04-29 – 2012-05-03 (×5): 650 mg via ORAL

## 2012-04-29 MED ORDER — VITAMIN B-1 100 MG PO TABS
100.0000 mg | ORAL_TABLET | Freq: Every day | ORAL | Status: DC
  Administered 2012-04-30 – 2012-05-04 (×5): 100 mg via ORAL
  Filled 2012-04-29 (×7): qty 1

## 2012-04-29 MED ORDER — ALPRAZOLAM 0.5 MG PO TABS
0.5000 mg | ORAL_TABLET | Freq: Three times a day (TID) | ORAL | Status: DC | PRN
  Administered 2012-04-29: 0.5 mg via ORAL
  Filled 2012-04-29: qty 1

## 2012-04-29 MED ORDER — ALUM & MAG HYDROXIDE-SIMETH 200-200-20 MG/5ML PO SUSP: 30.0000 mL | ORAL | Status: DC | PRN

## 2012-04-29 MED ORDER — CLONIDINE HCL 0.1 MG PO TABS
0.1000 mg | ORAL_TABLET | Freq: Two times a day (BID) | ORAL | Status: DC
  Administered 2012-04-29: 0.1 mg via ORAL
  Filled 2012-04-29: qty 1

## 2012-04-29 MED ORDER — LISINOPRIL 20 MG PO TABS
20.0000 mg | ORAL_TABLET | Freq: Every day | ORAL | Status: DC
  Administered 2012-04-30: 20 mg via ORAL
  Filled 2012-04-29 (×4): qty 1

## 2012-04-29 MED ORDER — ACYCLOVIR 200 MG PO CAPS
400.0000 mg | ORAL_CAPSULE | Freq: Every day | ORAL | Status: DC
  Administered 2012-04-30 – 2012-05-04 (×5): 400 mg via ORAL
  Filled 2012-04-29 (×3): qty 2
  Filled 2012-04-29: qty 14
  Filled 2012-04-29 (×4): qty 2

## 2012-04-29 MED ORDER — SODIUM CHLORIDE 0.9 % IJ SOLN: 3.0000 mL | Freq: Two times a day (BID) | INTRAMUSCULAR | Status: DC

## 2012-04-29 MED ORDER — LORATADINE 10 MG PO TABS
10.0000 mg | ORAL_TABLET | Freq: Every day | ORAL | Status: DC
  Administered 2012-04-29: 10 mg via ORAL
  Filled 2012-04-29: qty 1

## 2012-04-29 MED ORDER — CHLORDIAZEPOXIDE HCL 25 MG PO CAPS
25.0000 mg | ORAL_CAPSULE | Freq: Four times a day (QID) | ORAL | Status: DC | PRN
  Administered 2012-04-29 – 2012-05-03 (×7): 25 mg via ORAL
  Filled 2012-04-29 (×8): qty 1

## 2012-04-29 MED ORDER — ACYCLOVIR 400 MG PO TABS
400.0000 mg | ORAL_TABLET | Freq: Every day | ORAL | Status: DC
  Filled 2012-04-29: qty 1

## 2012-04-29 MED ORDER — ENOXAPARIN SODIUM 40 MG/0.4ML ~~LOC~~ SOLN
40.0000 mg | SUBCUTANEOUS | Status: DC
  Filled 2012-04-29: qty 0.4

## 2012-04-29 MED ORDER — ONDANSETRON HCL 4 MG/2ML IJ SOLN: 4.0000 mg | Freq: Four times a day (QID) | INTRAMUSCULAR | Status: DC | PRN

## 2012-04-29 MED ORDER — MAGNESIUM HYDROXIDE 400 MG/5ML PO SUSP: 30.0000 mL | Freq: Every day | ORAL | Status: DC | PRN

## 2012-04-29 MED ORDER — TRAZODONE HCL 50 MG PO TABS
50.0000 mg | ORAL_TABLET | Freq: Every evening | ORAL | Status: DC | PRN
  Administered 2012-04-29 – 2012-04-30 (×4): 50 mg via ORAL
  Filled 2012-04-29 (×8): qty 1

## 2012-04-29 MED ORDER — METOPROLOL SUCCINATE ER 50 MG PO TB24
50.0000 mg | ORAL_TABLET | Freq: Every day | ORAL | Status: DC
  Administered 2012-04-30: 50 mg via ORAL
  Filled 2012-04-29 (×3): qty 1

## 2012-04-29 MED ORDER — INFLUENZA VIRUS VACC SPLIT PF IM SUSP: 0.5000 mL | INTRAMUSCULAR | Status: AC

## 2012-04-29 MED ORDER — VENLAFAXINE HCL ER 75 MG PO CP24
75.0000 mg | ORAL_CAPSULE | Freq: Every day | ORAL | Status: DC
  Administered 2012-04-29 – 2012-04-30 (×2): 75 mg via ORAL
  Filled 2012-04-29 (×3): qty 1

## 2012-04-29 MED ORDER — NICOTINE 14 MG/24HR TD PT24
14.0000 mg | MEDICATED_PATCH | Freq: Every day | TRANSDERMAL | Status: DC
  Administered 2012-04-30 – 2012-05-04 (×5): 14 mg via TRANSDERMAL
  Filled 2012-04-29 (×8): qty 1

## 2012-04-29 MED ORDER — PNEUMOCOCCAL VAC POLYVALENT 25 MCG/0.5ML IJ INJ: 0.5000 mL | INJECTION | INTRAMUSCULAR | Status: AC

## 2012-04-29 MED ORDER — ADULT MULTIVITAMIN W/MINERALS CH
1.0000 | ORAL_TABLET | Freq: Every day | ORAL | Status: DC
  Administered 2012-04-30 – 2012-05-04 (×5): 1 via ORAL
  Filled 2012-04-29 (×7): qty 1

## 2012-04-29 MED ORDER — FOLIC ACID 1 MG PO TABS
1.0000 mg | ORAL_TABLET | Freq: Every day | ORAL | Status: DC
  Administered 2012-04-29 – 2012-05-04 (×6): 1 mg via ORAL
  Filled 2012-04-29 (×8): qty 1

## 2012-04-29 MED ORDER — ALBUTEROL SULFATE (5 MG/ML) 0.5% IN NEBU: 2.5000 mg | INHALATION_SOLUTION | RESPIRATORY_TRACT | Status: DC | PRN

## 2012-04-29 NOTE — Progress Notes (Signed)
D: Patient in her room on approach.  Patient states she has had anxiety all day.  Patient states she feels that she is in a foreign place.  Patient states, "I don't think I should have come here because you guys did not want me here. Patient denies SI/HI and denies AVH.  Patient B/P was elevated tonight but blood pressure was taken manually and blood pressure was better.  Patient appears anxious and complains on constant anxiety. A: Staff to monitor Q 15 mins for safety.  Encouragement and support offered.  Scheduled medications administered per orders.  Librium administered prn for anxiety.  Writer explained to patient that she was wanted her but her blood pressure had to be stable before she could be admitted to [facility]. R: Patient remains safe on the unit.  Patient attended group tonight.  Patient cooperative and taking administered medications.  Patient B/P decreased some after patient relaxed in her room.

## 2012-04-29 NOTE — Progress Notes (Signed)
Pt remained here at [facility] throughout the night, due to BPs remaining elevated. At this time, pts BP has met parameters per [facility]. Report called to Lupita Leash, RN at [facility]. [facility] is in route. Sheryn Bison

## 2012-04-29 NOTE — Progress Notes (Signed)
UR Chart Review Completed  

## 2012-04-29 NOTE — Progress Notes (Signed)
After giving clonidine 0.1 mg patient's BP is still elevated, 167/111. No acute symptoms. MD called. No further antihypertensives ordered per MD request. Will continue to monitor.
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
## 2012-04-29 NOTE — Progress Notes (Signed)
Patient pleasant and cooperative during admission assessment. Patient denies SI/HI at this time.Patient denies AVH. Patient verbalizes "on Monday night I took too many xanax, I'm not sure if I wanted to harm myself, I just wanted to go to sleep and not think for a while." Patient stressors include she was diagnosed with stage III breast cancer in April 2012, also her brother committed suicide in April 2011. Patient is wearing pink restricted extremity bracelet to designate "save" left arm precautions. Patient states "I have limited range of motion in my left arm and that is the best it will ever get." Patient verbalizes "lots of things happened and they just piled up all at once."  Patient states "I have about 3 glasses of red wine a night."  Patient informed of fall risk status, fall risk assessed "low" at this time. Patient oriented to unit/staff/room. Patient denies any questions/concerns at this time. Patient safe on unit with Q15 minute checks for safety.

## 2012-04-29 NOTE — Progress Notes (Signed)
Pt could not transfer to behavioral health due to high diastolic blood pressure per shift change report today. Blood pressure was to be checked x3 and patient would be able to transfer if diastolic blood pressure is below 100. First blood pressure was 167/109, rechecked again and was 172/115. Patient states that "lower number has been varying." Hospitalist was called and clonidine 0.1 mg was ordered and given. No acute symptoms present. Will continue to monitor.

## 2012-04-29 NOTE — Tx Team (Signed)
Initial Interdisciplinary Treatment Plan  PATIENT STRENGTHS: (choose at least two) Ability for insight Average or above average intelligence Financial means General fund of knowledge Motivation for treatment/growth Supportive family/friends  PATIENT STRESSORS: Health problems Traumatic event   PROBLEM LIST: Problem List/Patient Goals Date to be addressed Date deferred Reason deferred Estimated date of resolution  Anxiety      Depression      Suicidal Ideations                                           DISCHARGE CRITERIA:  Medical problems require only outpatient monitoring Reduction of life-threatening or endangering symptoms to within safe limits  PRELIMINARY DISCHARGE PLAN: Attend aftercare/continuing care group Outpatient therapy  PATIENT/FAMIILY INVOLVEMENT: This treatment plan has been presented to and reviewed with the patient, Brittany Blair, and/or family member, .  The patient and family have been given the opportunity to ask questions and make suggestions.  Noah Charon 04/29/2012, 2:24 PM

## 2012-04-30 DIAGNOSIS — F329 Major depressive disorder, single episode, unspecified: Secondary | ICD-10-CM

## 2012-04-30 MED ORDER — HYDROCODONE-ACETAMINOPHEN 5-325 MG PO TABS
1.0000 | ORAL_TABLET | Freq: Two times a day (BID) | ORAL | Status: DC | PRN
  Administered 2012-04-30 – 2012-05-04 (×6): 1 via ORAL
  Filled 2012-04-30 (×6): qty 1

## 2012-04-30 MED ORDER — HYDRALAZINE HCL 10 MG PO TABS
10.0000 mg | ORAL_TABLET | Freq: Three times a day (TID) | ORAL | Status: DC | PRN
  Administered 2012-04-30: 10 mg via ORAL
  Filled 2012-04-30: qty 1

## 2012-04-30 MED ORDER — VENLAFAXINE HCL ER 150 MG PO CP24
150.0000 mg | ORAL_CAPSULE | Freq: Every day | ORAL | Status: DC
  Administered 2012-05-01 – 2012-05-04 (×4): 150 mg via ORAL
  Filled 2012-04-30 (×4): qty 1
  Filled 2012-04-30: qty 14
  Filled 2012-04-30 (×2): qty 1

## 2012-04-30 MED ORDER — LISINOPRIL 20 MG PO TABS
40.0000 mg | ORAL_TABLET | Freq: Every day | ORAL | Status: DC
  Administered 2012-05-01 – 2012-05-04 (×4): 40 mg via ORAL
  Filled 2012-04-30: qty 2
  Filled 2012-04-30: qty 14
  Filled 2012-04-30: qty 1
  Filled 2012-04-30 (×5): qty 2
  Filled 2012-04-30: qty 1

## 2012-04-30 MED ORDER — METOPROLOL SUCCINATE ER 50 MG PO TB24
75.0000 mg | ORAL_TABLET | Freq: Every day | ORAL | Status: DC
  Administered 2012-05-01 – 2012-05-04 (×4): 75 mg via ORAL
  Filled 2012-04-30: qty 1
  Filled 2012-04-30: qty 11
  Filled 2012-04-30 (×2): qty 1.5
  Filled 2012-04-30: qty 1
  Filled 2012-04-30 (×4): qty 1.5

## 2012-04-30 NOTE — Progress Notes (Signed)
Adult Psychoeducational Group Note  Date:  04/30/2012 Time:  1100  Group Topic/Focus:  Rediscovering Joy:   The focus of this group is to explore various ways to relieve stress in a positive manner.  Participation Level:  Active  Participation Quality:  Appropriate, Attentive, Sharing and Supportive  Affect:  Appropriate  Cognitive:  Alert, Appropriate and Oriented  Insight: Appropriate  Engagement in Group:  Engaged and Supportive  Modes of Intervention:  Discussion, Education and Socialization  Additional Comments:  Patient sharing and supportive, verbalizes "I like to watch Jodelle Gross movies, they are so funny."   Noah Charon 04/30/2012, 12:34 PM
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
## 2012-04-30 NOTE — Consult Note (Signed)
[facility] Medical Consultation  Brittany Blair WUJ:[id number] DOB: 03/19/1960 DOA: 04/29/2012 PCP: Anne Ng   Requesting physician: Verne Spurr Date of consultation: 04/30/12 Reason for consultation: HTN  Impression/Recommendations Active Problems:   Breast Cancer, IDC,Left, central, Stage II, receptor + Her2 -   GAD (generalized anxiety disorder)   HTN (hypertension), benign   Depression    1. HTN- patient has fairly poorly controlled HTN at baseline when in hospital- reports better results at home but reports that when she would see her cancer dr her BP would be greater than 160/100.  Will increase toprol XL, lisinopril and add PRN hydralazine.  I believe depression/anxiety are affecting her BP as well as pain, drinks 2 glasses of wine daily (alcohol level 271 on admission to [facility]) 2. Depression- per primary 3. Anxiety- per primary 4. Alcohol use- would recommend CIWA protocol- ? Withdrawal- has tremors 5. Chronic pain- will start back vicodin 5/325 as pain can contribute to her elevated BP  I will followup again tomorrow. Please contact me if I can be of assistance in the meanwhile. Thank you for this consultation.  Chief Complaint: HTN  HPI: 52 yo female transferred to [facility] from [facility] after an attempted suicide with overdose of medications.  She reports a long history of poorly controlled BP  She is c/o back pain which is chronic and she also is c/o anxiety and is very worried about stopping her xanax "cold [place]"   Review of Systems:  All systems reviewed, negative unless stated above  Past Medical History  Diagnosis Date  . Anxiety   . Herpes   . MRSA (methicillin resistant Staphylococcus aureus)   . Breast lump     left  . Hypertension   . Hematoma   . Swelling     left foot  . Multiple blisters     along surgical site  . Breast cancer    Past Surgical History  Procedure Laterality Date  . Tubal ligation    . Wisdom tooth extraction    .  Mastectomy modified radical  10/10/10     left -Dr Jamey Ripa   Social History:  reports that she has been smoking.  She does not have any smokeless tobacco history on file. She reports that she drinks about 12.6 ounces of alcohol per week. She reports that she does not use illicit drugs.  Allergies  Allergen Reactions  . Bee Venom Anaphylaxis   Family History  Problem Relation Age of Onset  . Heart disease Father     heart attack    Prior to Admission medications   Medication Sig Start Date End Date Taking? Authorizing Oakleigh Hesketh  acyclovir (ZOVIRAX) 200 MG capsule Take 200 mg by mouth daily.   Yes Historical Aidah Forquer, MD  ALPRAZolam Prudy Feeler) 0.5 MG tablet Take 0.5 mg by mouth 3 (three) times daily as needed for anxiety. 04/27/12  Yes Randall An, MD  anastrozole (ARIMIDEX) 1 MG tablet Take 1 tablet (1 mg total) by mouth daily. 08/16/11  Yes Pierce Crane, MD  fexofenadine (ALLEGRA) 180 MG tablet Take 180 mg by mouth daily.   Yes Historical Sarika Baldini, MD  goserelin (ZOLADEX) 3.6 MG injection Inject 3.6 mg into the skin every 3 (three) months.   Yes Historical Vicktoria Muckey, MD  HYDROcodone-acetaminophen (NORCO/VICODIN) 5-325 MG per tablet Take 1 tablet by mouth every 6 (six) hours as needed for pain. 12/27/11  Yes Pierce Crane, MD  lisinopril (PRINIVIL,ZESTRIL) 20 MG tablet Take 20 mg by mouth daily.   Yes  Historical Domonique Cothran, MD  loratadine (CLARITIN) 10 MG tablet Take 10 mg by mouth daily.     Yes Historical Devynn Hessler, MD  metoprolol succinate (TOPROL XL) 50 MG 24 hr tablet Take 1 tablet (50 mg total) by mouth daily. Take with or immediately following a meal. 12/27/11  Yes Pierce Crane, MD  tetrahydrozoline-zinc (VISINE-AC) 0.05-0.25 % ophthalmic solution Place 2 drops into both eyes 3 (three) times daily as needed (Red Eyes).   Yes Historical Jovie Swanner, MD  venlafaxine XR (EFFEXOR-XR) 75 MG 24 hr capsule Take 1 capsule (75 mg total) by mouth daily. 12/27/11 12/26/12 Yes Pierce Crane, MD   Physical  Exam: Blood pressure 180/102, pulse 86, temperature 97.6 F (36.4 C), temperature source Oral, resp. rate 16, height 5\' 4"  (1.626 m), weight 81.647 kg (180 lb), last menstrual period 06/13/2010. Filed Vitals:   04/29/12 2301 04/30/12 0600 04/30/12 0601 04/30/12 1515  BP: 136/90 158/109 153/106 180/102  Pulse:  87 88 86  Temp:  97.6 F (36.4 C)    TempSrc:  Oral    Resp:  16  16  Height:      Weight:        General appearance: anxious appearing, tremulous Head: Normocephalic, without obvious abnormality, atraumatic  Eyes: conjunctivae/corneas clear. PERRL, EOM's intact.  Throat: lips, mucosa, and tongue normal; teeth and gums normal  Neck: no adenopathy, no carotid bruit, no JVD, supple, symmetrical, trachea midline and thyroid not enlarged, symmetric, no tenderness/mass/nodules  Resp: clear to auscultation bilaterally with decreased air entry at bases.  Chest wall: left sided chest wall tenderness.  Cardio: regular rate and rhythm, S1, S2 normal, no murmur, click, rub or gallop  GI: soft, non-tender; bowel sounds normal; no masses, no organomegaly  Extremities: extremities normal, atraumatic, no cyanosis or edema  Pulses: 2+ and symmetric  Skin: Skin changes with hyperpigmentation at left chest.  Lymph nodes: Cervical, supraclavicular, and axillary nodes normal.  Neurologic: Drowsy. Follows commands. Moving all extremities. No focal deficits   Labs on Admission:  Basic Metabolic Panel:  Recent Labs Lab 04/28/12 0233 04/29/12 0507 04/29/12 2034  NA 135 141 141  K 4.4 3.9 4.1  CL 97 106 104  CO2 20 25 25   GLUCOSE 90 90 102*  BUN 12 8 10   CREATININE 0.74 0.69 0.66  CALCIUM 9.0 9.3 9.5   Liver Function Tests:  Recent Labs Lab 04/28/12 0233 04/29/12 0507 04/29/12 2034  AST 49* 35 41*  ALT 40* 29 34  ALKPHOS 98 87 105  BILITOT 0.2* 0.5 0.3  PROT 7.9 6.3 7.7  ALBUMIN 4.1 3.5 3.9   No results found for this basename: LIPASE, AMYLASE,  in the last 168 hours No  results found for this basename: AMMONIA,  in the last 168 hours CBC:  Recent Labs Lab 04/28/12 0233 04/29/12 0507 04/29/12 2034  WBC 7.0 4.5 5.4  NEUTROABS 3.8  --   --   HGB 13.6 11.1* 12.2  HCT 40.1 33.0* 36.1  MCV 101.5* 100.3* 99.4  PLT 315 229 238   Cardiac Enzymes: No results found for this basename: CKTOTAL, CKMB, CKMBINDEX, TROPONINI,  in the last 168 hours BNP: No components found with this basename: POCBNP,  CBG: No results found for this basename: GLUCAP,  in the last 168 hours  Radiological Exams on Admission: No results found.    Time spent: 70 min  Benjamine Mola [name] [facility] Pager [phone]  If 7PM-7AM, please contact night-coverage [email or web address] Password [facility] 04/30/2012, 4:52 PM

## 2012-04-30 NOTE — Progress Notes (Signed)
Pt attended Karaoke group, was attentive and supportive of peers.  
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
## 2012-04-30 NOTE — Progress Notes (Signed)
[facility] Group Notes:  (Nursing/MHT/Case Management/Adjunct)  Date:  04/30/2012  Time:  1:14 AM  Type of Therapy:  Group Therapy  Participation Level:  Active  Participation Quality:  Appropriate  Affect:  Appropriate and Tearful  Cognitive:  Appropriate  Insight:  Appropriate  Engagement in Group:  Engaged  Modes of Intervention:  Socialization and Support  Summary of Progress/Problems: Pt. Stated "Get me Back" is the importance of her treatment.  Pt. Stated she wanted to focus on her needs.  Sondra Come 04/30/2012, 1:14 AM

## 2012-04-30 NOTE — Progress Notes (Signed)
  D) Patient pleasant and cooperative upon my assessment. Patient appears more bright and animated Patient completed Patient Self Inventory, reports slept "only after I got medication," and  appetite is "improving." Patient rates depression as   5/10, patient rates hopeless feelings as  5/10. Patient denies SI/HI, denies A/V hallucinations.   A) Patient offered support and encouragement, patient encouraged to discuss feelings/concerns with staff. Patient verbalized understanding. Patient monitored Q15 minutes for safety. Patient met with MD  to discuss today's goals and plan of care.  R) Patient visible in milieu, attending groups in day room and meals in dining room. Patient appropriate with staff and peers.   Patient taking medications as ordered. Patient has a plan to "improve sleep habits" moving forward.  Will continue to monitor.

## 2012-04-30 NOTE — Progress Notes (Signed)
[facility] LCSW Group Therapy     [facility] 1:15 - 2:30 PM   04/30/2012 3:28 PM  Type of Therapy:  Group Therapy  Participation Level:  Active  Participation Quality:  Appropriate and Attentive  Affect:  Appropriate, Depressed and Tearful  Cognitive:  Alert and Appropriate  Insight:  Engaged  Engagement in Therapy:  Engaged  Modes of Intervention:  Discussion, Education, Exploration, Problem-solving, Rapport Building and Support  Summary of Progress/Problems:  Patient listened attentively to speaker from [employer]. She asked speaker if there were times she believed others could be in recovery but not speaker.  Speaker shard the fact that patient was in group and crying showed she is in recovery.  Patient stated her story very similar to speakers.   Wynn Banker 04/30/2012, 3:28 PM

## 2012-04-30 NOTE — Progress Notes (Signed)
[facility] LCSW Aftercare Discharge Planning Group Note  04/30/2012 1:12 PM  Participation Quality:  Appropriate and Attentive  Affect:  Appropriate and Depressed  Cognitive:  Alert and Appropriate  Insight:  Developing/Improving  Engagement in Group:  Developing/Improving  Modes of Intervention:  Education, Exploration, Problem-solving, Rapport Building and Support  Summary of Progress/Problems:  Patient reports doing better today.  She denies SI/HI and rates depression and anxiety at four.  Patient reports having home and transportation.  She will need a referral for outpatient services.  Daily workbook provided.   Wynn Banker 04/30/2012, 1:12 PM

## 2012-05-01 DIAGNOSIS — F329 Major depressive disorder, single episode, unspecified: Secondary | ICD-10-CM

## 2012-05-01 MED ORDER — TRAZODONE HCL 100 MG PO TABS
100.0000 mg | ORAL_TABLET | Freq: Every evening | ORAL | Status: DC | PRN
  Administered 2012-05-01 – 2012-05-04 (×5): 100 mg via ORAL
  Filled 2012-05-01 (×4): qty 1
  Filled 2012-05-01: qty 28
  Filled 2012-05-01 (×7): qty 1
  Filled 2012-05-01: qty 28

## 2012-05-01 NOTE — Progress Notes (Signed)
Patient ID: Brittany Blair, female   DOB: Jun 10, 1960, 52 y.o.   MRN: [id number]  D: Patient pleasant on approach tonight. Very bright and polite. Talks openly about her medical issues with cancer. Patient wants to get as much groups and information as possible so she will not have to come back. Reports mood improved and denies any SI at present. A: Staff will monitor on q 15 minute checks, follow treatment plans, and give meds as ordered. R: Cooperative with staff and peers.
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
## 2012-05-01 NOTE — Progress Notes (Signed)
[facility] MD Progress Note  05/01/2012 1:10 PM Brittany Blair  MRN:  [id number] Subjective:  Patient reports feeling tired, difficulty sleeping. Continues to be depressed, tolerating increase in Effexor well.  Diagnosis:   Axis I: Major Depression, Recurrent severe Axis II: Deferred Axis III:  Past Medical History  Diagnosis Date  . Anxiety   . Herpes   . MRSA (methicillin resistant Staphylococcus aureus)   . Breast lump     left  . Hypertension   . Hematoma   . Swelling     left foot  . Multiple blisters     along surgical site  . Breast cancer    Axis IV: other psychosocial or environmental problems Axis V: 41-50 serious symptoms  ADL's:  Intact  Sleep: Poor  Appetite:  Fair   Psychiatric Specialty Exam: Review of Systems  Constitutional: Positive for malaise/fatigue.  HENT: Negative.   Eyes: Negative.   Respiratory: Negative.   Cardiovascular: Negative.   Gastrointestinal: Negative.   Genitourinary: Negative.   Musculoskeletal: Negative.   Skin: Negative.   Neurological: Negative.   Endo/Heme/Allergies: Negative.   Psychiatric/Behavioral: Positive for depression. The patient is nervous/anxious and has insomnia.     Blood pressure 150/100, pulse 80, temperature 97.6 F (36.4 C), temperature source Oral, resp. rate 16, height 5\' 4"  (1.626 m), weight 81.647 kg (180 lb), last menstrual period 06/13/2010.Body mass index is 30.88 kg/(m^2).  General Appearance: Casual  Eye Contact::  Fair  Speech:  Slow  Volume:  Decreased  Mood:  Depressed and Dysphoric  Affect:  Constricted and Depressed  Thought Process:  Coherent  Orientation:  Full (Time, Place, and Person)  Thought Content:  Rumination  Suicidal Thoughts:  No  Homicidal Thoughts:  No  Memory:  Immediate;   Fair Recent;   Fair Remote;   Fair  Judgement:  Fair  Insight:  Present  Psychomotor Activity:  Decreased  Concentration:  Fair  Recall:  Fair  Akathisia:  No  Handed:  Right  AIMS (if indicated):      Assets:  Communication Skills Desire for Improvement  Sleep:  Number of Hours: 3.25   Current Medications: Current Facility-Administered Medications  Medication Dose Route Frequency Provider Last Rate Last Dose  . acetaminophen (TYLENOL) tablet 650 mg  650 mg Oral Q6H PRN Verne Spurr, PA-C   650 mg at 04/30/12 2340  . acyclovir (ZOVIRAX) 200 MG capsule 400 mg  400 mg Oral Daily Himabindu Ravi, MD   400 mg at 05/01/12 0802  . albuterol (PROVENTIL) (5 MG/ML) 0.5% nebulizer solution 2.5 mg  2.5 mg Nebulization Q2H PRN Verne Spurr, PA-C      . alum & mag hydroxide-simeth (MAALOX/MYLANTA) 200-200-20 MG/5ML suspension 30 mL  30 mL Oral Q4H PRN Verne Spurr, PA-C      . anastrozole (ARIMIDEX) tablet 1 mg  1 mg Oral Daily Verne Spurr, PA-C   1 mg at 05/01/12 0803  . chlordiazePOXIDE (LIBRIUM) capsule 25 mg  25 mg Oral QID PRN Kerry Hough, PA-C   25 mg at 04/30/12 2144  . folic acid (FOLVITE) tablet 1 mg  1 mg Oral Daily Verne Spurr, PA-C   1 mg at 05/01/12 1308  . hydrALAZINE (APRESOLINE) tablet 10 mg  10 mg Oral Q8H PRN Joseph Art, DO   10 mg at 04/30/12 1622  . HYDROcodone-acetaminophen (NORCO/VICODIN) 5-325 MG per tablet 1 tablet  1 tablet Oral Q12H PRN Joseph Art, DO   1 tablet at 05/01/12 0809  . lisinopril (  PRINIVIL,ZESTRIL) tablet 40 mg  40 mg Oral Daily Joseph Art, DO   40 mg at 05/01/12 0802  . magnesium hydroxide (MILK OF MAGNESIA) suspension 30 mL  30 mL Oral Daily PRN Verne Spurr, PA-C      . metoprolol succinate (TOPROL-XL) 24 hr tablet 75 mg  75 mg Oral Daily Joseph Art, DO   75 mg at 05/01/12 0803  . multivitamin with minerals tablet 1 tablet  1 tablet Oral Daily Verne Spurr, PA-C   1 tablet at 05/01/12 0802  . naproxen (NAPROSYN) tablet 500 mg  500 mg Oral BID WC Kerry Hough, PA-C   500 mg at 05/01/12 0801  . nicotine (NICODERM CQ - dosed in mg/24 hours) patch 14 mg  14 mg Transdermal Daily Verne Spurr, PA-C   14 mg at 05/01/12 1610  .  ondansetron (ZOFRAN-ODT) disintegrating tablet 4 mg  4 mg Oral Q6H PRN Verne Spurr, PA-C      . thiamine (VITAMIN B-1) tablet 100 mg  100 mg Oral Daily Verne Spurr, PA-C   100 mg at 05/01/12 0801  . traZODone (DESYREL) tablet 100 mg  100 mg Oral QHS,MR X 1 Himabindu Ravi, MD      . venlafaxine XR (EFFEXOR-XR) 24 hr capsule 150 mg  150 mg Oral Daily Himabindu Ravi, MD   150 mg at 05/01/12 0801    Lab Results:  Results for orders placed during the hospital encounter of 04/29/12 (from the past 48 hour(s))  CBC     Status: Abnormal   Collection Time    04/29/12  8:34 PM      Result Value Range   WBC 5.4  4.0 - 10.5 K/uL   RBC 3.63 (*) 3.87 - 5.11 MIL/uL   Hemoglobin 12.2  12.0 - 15.0 g/dL   HCT 96.0  45.4 - 09.8 %   MCV 99.4  78.0 - 100.0 fL   MCH 33.6  26.0 - 34.0 pg   MCHC 33.8  30.0 - 36.0 g/dL   RDW 11.9  14.7 - 82.9 %   Platelets 238  150 - 400 K/uL  COMPREHENSIVE METABOLIC PANEL     Status: Abnormal   Collection Time    04/29/12  8:34 PM      Result Value Range   Sodium 141  135 - 145 mEq/L   Potassium 4.1  3.5 - 5.1 mEq/L   Chloride 104  96 - 112 mEq/L   CO2 25  19 - 32 mEq/L   Glucose, Bld 102 (*) 70 - 99 mg/dL   BUN 10  6 - 23 mg/dL   Creatinine, Ser 5.62  0.50 - 1.10 mg/dL   Calcium 9.5  8.4 - 13.0 mg/dL   Total Protein 7.7  6.0 - 8.3 g/dL   Albumin 3.9  3.5 - 5.2 g/dL   AST 41 (*) 0 - 37 U/L   ALT 34  0 - 35 U/L   Alkaline Phosphatase 105  39 - 117 U/L   Total Bilirubin 0.3  0.3 - 1.2 mg/dL   GFR calc non Af Amer >90  >90 mL/min   GFR calc Af Amer >90  >90 mL/min   Comment:            The eGFR has been calculated     using the CKD EPI equation.     This calculation has not been     validated in all clinical     situations.     eGFR's  persistently     <90 mL/min signify     possible Chronic Kidney Disease.  ACETAMINOPHEN LEVEL     Status: None   Collection Time    04/29/12  8:34 PM      Result Value Range   Acetaminophen (Tylenol), Serum <15.0  10 - 30  ug/mL   Comment:            THERAPEUTIC CONCENTRATIONS VARY     SIGNIFICANTLY. A RANGE OF 10-30     ug/mL MAY BE AN EFFECTIVE     CONCENTRATION FOR MANY PATIENTS.     HOWEVER, SOME ARE BEST TREATED     AT CONCENTRATIONS OUTSIDE THIS     RANGE.     ACETAMINOPHEN CONCENTRATIONS     >150 ug/mL AT 4 HOURS AFTER     INGESTION AND >50 ug/mL AT 12     HOURS AFTER INGESTION ARE     OFTEN ASSOCIATED WITH TOXIC     REACTIONS.    Physical Findings: AIMS: Facial and Oral Movements Muscles of Facial Expression: None, normal Lips and Perioral Area: None, normal Jaw: None, normal Tongue: None, normal,Extremity Movements Upper (arms, wrists, hands, fingers): None, normal, Trunk Movements Neck, shoulders, hips: None, normal, Overall Severity Severity of abnormal movements (highest score from questions above): None, normal Incapacitation due to abnormal movements: None, normal Patient's awareness of abnormal movements (rate only patient's report): No Awareness, Dental Status Current problems with teeth and/or dentures?: No Does patient usually wear dentures?: No  CIWA:    COWS:     Treatment Plan Summary: Daily contact with patient to assess and evaluate symptoms and progress in treatment Medication management  Plan: Increase Trazodone to 100mg  to help with sleep. BP elevated,  continue to monitor. Continue current plan of care. Encourage patient to reflect, participate in groups and develop strategies to deal with children being away from home.  Medical Decision Making Problem Points:  Established problem, stable/improving (1), Review of last therapy session (1) and Review of psycho-social stressors (1) Data Points:  Review of medication regiment & side effects (2) Review of new medications or change in dosage (2)  I certify that inpatient services furnished can reasonably be expected to improve the patient's condition.   RAVI, HIMABINDU 05/01/2012, 1:10 PM

## 2012-05-01 NOTE — Progress Notes (Signed)
[facility] LCSW Group Therapy        Feelings Around Relapse        1:15-2:30 PM         05/01/2012 4:30 PM  Type of Therapy:  Group Therapy  Participation Level:  Active  Participation Quality:  Appropriate and Attentive  Affect:  Appropriate, Depressed and Tearful  Cognitive:  Alert and Appropriate  Insight:  Engaged  Engagement in Therapy:  Engaged  Modes of Intervention:  Discussion, Education, Exploration, Problem-solving, Rapport Building and Support  Summary of Progress/Problems:  Patient shared relapsed for her would be isolating and spending 16 hours a day in bed.  Patient shared she is feeling much better and has a more positive outlook.  Wynn Banker 05/01/2012, 4:30 PM

## 2012-05-01 NOTE — Progress Notes (Signed)
BP better but will follow in AM to see how BP reacts after 24 hours of increased medications.  Call for questions [phone]  Marlin Canary DO

## 2012-05-01 NOTE — Progress Notes (Signed)
Recreation Therapy Notes  Date: 03.07.2014  Time: 3:00pm  Location: Art Room   Group Topic/Focus: Leisure Education   Participation Level:  Active   Participation Quality:  Appropriate   Affect:  Euthymic   Cognitive:  Oriented   Additional Comments: Patient played adapted boggle. Patient was given 1 minute to list as many leisure and recreation activities as possible that began with a letter of the alphabet. LRT selected letter from container for patients. Patient with peers then generated group list for the letters called out. At the conclusion of group patients shouted out activities for the letters in the alphabet that were not selected from the container.   Patient actively participated in group activity. Patient contributed to group list of leisure and recreation activities.   Marykay Lex Blanchfield, LRT/CTRS  Jearl Klinefelter 05/01/2012 4:22 PM

## 2012-05-02 MED ORDER — FUROSEMIDE 20 MG PO TABS
20.0000 mg | ORAL_TABLET | Freq: Once | ORAL | Status: AC
  Administered 2012-05-02: 20 mg via ORAL
  Filled 2012-05-02: qty 1

## 2012-05-02 MED ORDER — AMLODIPINE BESYLATE 5 MG PO TABS
5.0000 mg | ORAL_TABLET | Freq: Every day | ORAL | Status: DC
  Administered 2012-05-02 – 2012-05-04 (×3): 5 mg via ORAL
  Filled 2012-05-02: qty 7
  Filled 2012-05-02 (×5): qty 1

## 2012-05-02 NOTE — Progress Notes (Signed)
[facility] MD Progress Note  05/02/2012 4:14 PM Brittany Blair  MRN:  [id number] Subjective:  Pt took overdose of pills just to go to sleep and not think about all the recent events:  Two family suicides, and her breast cancer surgery, chemo, radiation Stage 3 C.  Diagnosis:   Axis I: Major Depression, Recurrent severe Axis II: Deferred Axis III:  Past Medical History  Diagnosis Date  . Anxiety   . Herpes   . MRSA (methicillin resistant Staphylococcus aureus)   . Breast lump     left  . Hypertension   . Hematoma   . Swelling     left foot  . Multiple blisters     along surgical site  . Breast cancer    Axis IV: economic problems, educational problems, other psychosocial or environmental problems and problems with access to health care services Axis V: 41-50 serious symptoms  ADL's:  Intact  Sleep: Poor  Appetite:  Fair  Suicidal Ideation:  Plan:  no thoughts Homicidal Ideation:  Plan:  no thoughts AEB (as evidenced by):  Psychiatric Specialty Exam: Review of Systems  Cardiovascular: Positive for leg swelling.    Blood pressure 132/89, pulse 91, temperature 97.5 F (36.4 C), temperature source Oral, resp. rate 16, height 5\' 4"  (1.626 m), weight 81.647 kg (180 lb), last menstrual period 06/13/2010.Body mass index is 30.88 kg/(m^2).  General Appearance: Casual, Fairly Groomed and Neat  Eye Contact::  Good  Speech:  Clear and Coherent and Normal Rate  Volume:  Normal  Mood:  Euthymic  Affect:  Appropriate  Thought Process:  Coherent, Goal Directed, Intact and Logical  Orientation:  Full (Time, Place, and Person)  Thought Content:  plans to have a moree active life  Suicidal Thoughts:  No  Homicidal Thoughts:  No  Memory:  Immediate;   Fair Recent;   Good Remote;   Good  Judgement:  Fair  Insight:  Good  Psychomotor Activity:  Normal  Concentration:  Good  Recall:  Fair  Akathisia:  NA  Handed:  Right  AIMS (if indicated):     Assets:  Communication Skills Desire  for Improvement Financial Resources/Insurance Housing Intimacy Leisure Time Talents/Skills Transportation Vocational/Educational  Sleep:  Number of Hours: 6   Current Medications: Current Facility-Administered Medications  Medication Dose Route Frequency Provider Last Rate Last Dose  . acetaminophen (TYLENOL) tablet 650 mg  650 mg Oral Q6H PRN Verne Spurr, PA-C   650 mg at 04/30/12 2340  . acyclovir (ZOVIRAX) 200 MG capsule 400 mg  400 mg Oral Daily Himabindu Ravi, MD   400 mg at 05/02/12 0856  . albuterol (PROVENTIL) (5 MG/ML) 0.5% nebulizer solution 2.5 mg  2.5 mg Nebulization Q2H PRN Verne Spurr, PA-C      . alum & mag hydroxide-simeth (MAALOX/MYLANTA) 200-200-20 MG/5ML suspension 30 mL  30 mL Oral Q4H PRN Verne Spurr, PA-C      . amLODipine (NORVASC) tablet 5 mg  5 mg Oral Daily Joseph Art, DO   5 mg at 05/02/12 1113  . anastrozole (ARIMIDEX) tablet 1 mg  1 mg Oral Daily Verne Spurr, PA-C   1 mg at 05/02/12 0856  . chlordiazePOXIDE (LIBRIUM) capsule 25 mg  25 mg Oral QID PRN Kerry Hough, PA-C   25 mg at 05/01/12 2222  . folic acid (FOLVITE) tablet 1 mg  1 mg Oral Daily Verne Spurr, PA-C   1 mg at 05/02/12 0856  . hydrALAZINE (APRESOLINE) tablet 10 mg  10 mg Oral  Q8H PRN Joseph Art, DO   10 mg at 04/30/12 1622  . HYDROcodone-acetaminophen (NORCO/VICODIN) 5-325 MG per tablet 1 tablet  1 tablet Oral Q12H PRN Joseph Art, DO   1 tablet at 05/01/12 2223  . lisinopril (PRINIVIL,ZESTRIL) tablet 40 mg  40 mg Oral Daily Joseph Art, DO   40 mg at 05/02/12 0856  . magnesium hydroxide (MILK OF MAGNESIA) suspension 30 mL  30 mL Oral Daily PRN Verne Spurr, PA-C      . metoprolol succinate (TOPROL-XL) 24 hr tablet 75 mg  75 mg Oral Daily Joseph Art, DO   75 mg at 05/02/12 0856  . multivitamin with minerals tablet 1 tablet  1 tablet Oral Daily Verne Spurr, PA-C   1 tablet at 05/02/12 0856  . naproxen (NAPROSYN) tablet 500 mg  500 mg Oral BID WC Kerry Hough,  PA-C   500 mg at 05/02/12 0856  . nicotine (NICODERM CQ - dosed in mg/24 hours) patch 14 mg  14 mg Transdermal Daily Verne Spurr, PA-C   14 mg at 05/02/12 0859  . ondansetron (ZOFRAN-ODT) disintegrating tablet 4 mg  4 mg Oral Q6H PRN Verne Spurr, PA-C      . thiamine (VITAMIN B-1) tablet 100 mg  100 mg Oral Daily Verne Spurr, PA-C   100 mg at 05/02/12 0856  . traZODone (DESYREL) tablet 100 mg  100 mg Oral QHS,MR X 1 Himabindu Ravi, MD   100 mg at 05/01/12 2222  . venlafaxine XR (EFFEXOR-XR) 24 hr capsule 150 mg  150 mg Oral Daily Himabindu Ravi, MD   150 mg at 05/02/12 1610    Lab Results: No results found for this or any previous visit (from the past 48 hour(s)).  Physical Findings: AIMS: Facial and Oral Movements Muscles of Facial Expression: None, normal Lips and Perioral Area: None, normal Jaw: None, normal Tongue: None, normal,Extremity Movements Upper (arms, wrists, hands, fingers): None, normal Lower (legs, knees, ankles, toes): None, normal, Trunk Movements Neck, shoulders, hips: None, normal, Overall Severity Severity of abnormal movements (highest score from questions above): None, normal Incapacitation due to abnormal movements: None, normal Patient's awareness of abnormal movements (rate only patient's report): No Awareness, Dental Status Current problems with teeth and/or dentures?: No Does patient usually wear dentures?: No  CIWA:    COWS:     Treatment Plan Summary: Pt has goals to reorganize couple's social life; and do volunteer work   Plan:  Systems developer Problem Points:  Established problem, stable/improving (1), Review of last therapy session (1) and Review of psycho-social stressors (1) Data Points:  Discuss tests with performing physician (1) Review or order medicine tests (1)  I certify that inpatient services furnished can reasonably be expected to improve the patient's condition.   BOGARD, PHYLLIS 05/02/2012, 4:15 PM

## 2012-05-02 NOTE — Progress Notes (Signed)
[facility] Group Notes:  (Nursing/MHT/Case Management/Adjunct)  Date:  05/01/2012 Time:  2000  Type of Therapy:  Psychoeducational Skills  Participation Level:  Active  Participation Quality:  Attentive  Affect:  Appropriate  Cognitive:  Appropriate  Insight:  Limited  Engagement in Group:  Limited  Modes of Intervention:  Exploration  Summary of Progress/Problems: The patient mentioned in group that she had a good day. She spent part of her day socializing with her peers which helped her to cope with her situation. Her goal for tomorrow is to get more rest. She had indicated that she only slept for three hours last evening.   GOODMAN, BENJAMIN S 05/02/2012, 12:26 AM

## 2012-05-02 NOTE — Clinical Social Work Note (Signed)
[facility] Group Notes:  (Clinical Social Work)  05/02/2012   3:00-4:00PM  Summary of Progress/Problems:   The main focus of today's process group was for the patient to identify something in their life that led to their hospitalization that they would like to change, then to discuss their motivation to change.  The Stages of Change were explained to the group, then each patient identified where they are in that process.  Scale was used with motivational interviewing to determine the patient's current motivation to change, with 1 being total lack of motivation and 10 being total commitment to change.  The patient expressed in a monologue all the issues which have been happening in her life such as suicides of relatives, cancer, problems with daughter.  She talked about previous therapist many years ago telling her "you're fine, you've always been fine, you just don't have the right tools in your toolbox" and related this to today.  Acted as in Psychologist, sport and exercise for other patients.  Stated her motivation is 10 out of 10 to change.  Type of Therapy:  Process Group  Participation Level:  Active  Participation Quality:  Attentive, Monopolizing, Sharing and Supportive  Affect:  Excited  Cognitive:  Oriented  Insight:  Improving  Engagement in Therapy:  Improving  Modes of Intervention:  Clarification, Support and Processing, Exploration, Discussion   Ambrose Mantle, LCSW 05/02/2012, 4:42 PM

## 2012-05-02 NOTE — Progress Notes (Signed)
[facility] PROGRESS NOTE  Brittany Blair ZOX:[id number] DOB: Mar 18, 1960 DOA: 04/29/2012 PCP: Brittany Co, PA-C  Assessment/Plan: 1. HTN- patient has fairly poorly controlled HTN at baseline when in hospital- reports better results at home but reports that when she would see her cancer dr her BP would be greater than 160/100. Will increase toprol XL, lisinopril and add PRN hydralazine.  drinks 2 glasses of wine daily (alcohol level 271 on admission to [facility])- add lasix x 1 and norvasc daily for help with diastolic dsfxn 2. Depression- per primary 3. Anxiety- per primary 4. Alcohol use- tremors gone 5. Chronic pain- will start back vicodin 5/325 as pain can contribute to her elevated BP  Will sign off- further titration per PCP as outpatient - for questions please call [phone] Code Status: full Family Communication: patient at bedside Disposition Plan: per primary     Procedures:  none  Antibiotics:  none  HPI/Subjective: Feeling better, c/o swelling in lower leg  Objective: Filed Vitals:   04/30/12 1940 04/30/12 2230 05/01/12 2150 05/01/12 2152  BP: 160/100 150/100 138/100 144/96  Pulse:   110 108  Temp:      TempSrc:      Resp:      Height:      Weight:       No intake or output data in the 24 hours ending 05/02/12 0929 Filed Weights   04/29/12 1400  Weight: 81.647 kg (180 lb)    Exam:   General:  A+Ox3, NAD  Cardiovascular: rrr  Respiratory: clear aterior  Abdomen: +BS, soft  Musculoskeletal: moves all 4 ext  Data Reviewed: Basic Metabolic Panel:  Recent Labs Lab 04/28/12 0233 04/29/12 0507 04/29/12 2034  NA 135 141 141  K 4.4 3.9 4.1  CL 97 106 104  CO2 20 25 25   GLUCOSE 90 90 102*  BUN 12 8 10   CREATININE 0.74 0.69 0.66  CALCIUM 9.0 9.3 9.5   Liver Function Tests:  Recent Labs Lab 04/28/12 0233 04/29/12 0507 04/29/12 2034  AST 49* 35 41*  ALT 40* 29 34  ALKPHOS 98 87 105  BILITOT 0.2* 0.5 0.3  PROT 7.9 6.3 7.7   ALBUMIN 4.1 3.5 3.9   No results found for this basename: LIPASE, AMYLASE,  in the last 168 hours No results found for this basename: AMMONIA,  in the last 168 hours CBC:  Recent Labs Lab 04/28/12 0233 04/29/12 0507 04/29/12 2034  WBC 7.0 4.5 5.4  NEUTROABS 3.8  --   --   HGB 13.6 11.1* 12.2  HCT 40.1 33.0* 36.1  MCV 101.5* 100.3* 99.4  PLT 315 229 238   Cardiac Enzymes: No results found for this basename: CKTOTAL, CKMB, CKMBINDEX, TROPONINI,  in the last 168 hours BNP (last 3 results) No results found for this basename: PROBNP,  in the last 8760 hours CBG: No results found for this basename: GLUCAP,  in the last 168 hours  Recent Results (from the past 240 hour(s))  MRSA PCR SCREENING     Status: None   Collection Time    04/28/12  8:08 AM      Result Value Range Status   MRSA by PCR NEGATIVE  NEGATIVE Final   Comment:            The GeneXpert MRSA Assay (FDA     approved for NASAL specimens     only), is one component of a     comprehensive MRSA colonization     surveillance program. It is not  intended to diagnose MRSA     infection nor to guide or     monitor treatment for     MRSA infections.     Studies: No results found.  Scheduled Meds: . acyclovir  400 mg Oral Daily  . amLODipine  5 mg Oral Daily  . anastrozole  1 mg Oral Daily  . folic acid  1 mg Oral Daily  . lisinopril  40 mg Oral Daily  . metoprolol succinate  75 mg Oral Daily  . multivitamin with minerals  1 tablet Oral Daily  . naproxen  500 mg Oral BID WC  . nicotine  14 mg Transdermal Daily  . thiamine  100 mg Oral Daily  . traZODone  100 mg Oral QHS,MR X 1  . venlafaxine XR  150 mg Oral Daily   Continuous Infusions:   Active Problems:   Breast Cancer, IDC,Left, central, Stage II, receptor + Her2 -   GAD (generalized anxiety disorder)   HTN (hypertension), benign   Depression    Time spent: 25    [facility], [name]  [facility] Pager [phone]. If 7PM-7AM, please  contact night-coverage at [email or web address], password [facility] 05/02/2012, 9:29 AM  LOS: 3 days

## 2012-05-02 NOTE — Progress Notes (Signed)
D) Pt has attended the groups and interacts with her peers appropriately. Rates her depression and her hopelessness both at a 2. Denies SI and HI. Has attempted to keep her feet up all day while in groups so that her edema would be less.  States that she wants to avoid isolation and interact with others as much as she can. A) Given support, reassurance and praise. Gvien encouragement. R) Denies SI and HI.

## 2012-05-03 NOTE — Progress Notes (Signed)
[facility] Group Notes:  (Nursing/MHT/Case Management/Adjunct)  Date:  05/02/2012 Time:  2000  Type of Therapy:  Psychoeducational Skills  Participation Level:  Active  Participation Quality:  Attentive  Affect:  Appropriate  Cognitive:  Appropriate  Insight:  Improving  Engagement in Group:  Engaged  Modes of Intervention:  Education  Summary of Progress/Problems: The patient acknowledged that she had a rough start to her day since she felt sick in the morning. She credits the improvement in her day to attending groups and to going outside with her peers. Her goal for tomorrow is to address the swelling in her arm and to inquire about taking new medication.   Caren Garske S 05/03/2012, 1:08 AM

## 2012-05-03 NOTE — Clinical Social Work Note (Signed)
[facility] Group Notes: (Clinical Social Work)   05/03/2012      Type of Therapy:  Group Therapy   Participation Level:  Did Not Attend  - was with P.A. Throughout group   Ambrose Mantle, LCSW 05/03/2012, 4:15 PM

## 2012-05-03 NOTE — Progress Notes (Signed)
Psychoeducational Group Note  Date:  05/03/2012 Time: 1015 Group Topic/Focus:  Making Healthy Choices:   The focus of this group is to help patients identify negative/unhealthy choices they were using prior to admission and identify positive/healthier coping strategies to replace them upon discharge.  Participation Level:  Active  Participation Quality:  Attentive  Affect:  Appropriate  Cognitive:  Appropriate  Insight:  Engaged  Engagement in Group:  Engaged  Additional Comments:    Brittany Blair 6:43 PM. 05/03/2012

## 2012-05-03 NOTE — Progress Notes (Signed)
Patient came to medication window to request med for pain she reported under her left arm and her back. Patient reported to Clinical research associate that she has been having swelling in her feet and the doctor informed her that she needed to wear her sneakers for swelling. Writer informed patient that doctor forgot to write an order and will call doctor for order. Patient was given tylenol for her pain and reports that she will take her vicodin later before she lies down for the night. Support and encouragement offered, writer will follow up with doctor for her sneakers to be worn. Patient denies si/hi/a/v hall. Safety maintained on unit, will continue to monitor.
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
## 2012-05-03 NOTE — Progress Notes (Signed)
Psychoeducational Group Note  Date: 05/03/2012 Time:  05/03/2012  Group Topic/Focus:  Gratefulness:  The focus of this group is to help patients identify what two things they are most grateful for in their lives. What helps ground them and to center them on their work to their recovery.  Participation Level:  Active  Participation Quality:  Appropriate  Affect:  Appropriate  Cognitive:  Appropriate  Insight:  Engaged  Engagement in Group:  Engaged  Additional Comments:    Judge, Christine A  
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
## 2012-05-04 MED ORDER — GOSERELIN ACETATE 3.6 MG ~~LOC~~ IMPL: 3.6000 mg | DRUG_IMPLANT | SUBCUTANEOUS | Status: DC

## 2012-05-04 MED ORDER — VENLAFAXINE HCL ER 150 MG PO CP24: 150.0000 mg | ORAL_CAPSULE | Freq: Every day | ORAL | Status: DC

## 2012-05-04 MED ORDER — AMLODIPINE BESYLATE 5 MG PO TABS: 5.0000 mg | ORAL_TABLET | Freq: Every day | ORAL | Status: DC

## 2012-05-04 MED ORDER — ACYCLOVIR 200 MG PO CAPS: 200.0000 mg | ORAL_CAPSULE | Freq: Every day | ORAL | Status: DC

## 2012-05-04 MED ORDER — TRAZODONE HCL 100 MG PO TABS: 100.0000 mg | ORAL_TABLET | Freq: Every evening | ORAL | Status: DC | PRN

## 2012-05-04 MED ORDER — METOPROLOL SUCCINATE ER 25 MG PO TB24: 75.0000 mg | ORAL_TABLET | Freq: Every day | ORAL | Status: DC

## 2012-05-04 MED ORDER — LISINOPRIL 40 MG PO TABS: 40.0000 mg | ORAL_TABLET | Freq: Every day | ORAL | Status: DC

## 2012-05-04 MED ORDER — ANASTROZOLE 1 MG PO TABS: 1.0000 mg | ORAL_TABLET | Freq: Every day | ORAL | Status: DC

## 2012-05-04 NOTE — Progress Notes (Signed)
[facility] Adult Case Management Discharge Plan :  Will you be returning to the same living situation after discharge: Yes,  Patient will return home with husband At discharge, do you have transportation home?:Yes,  Husband to transport patient home Do you have the ability to pay for your medications:No.  Patient assisted with indigent medications  Release of information consent forms completed and in the chart;  Patient's signature needed at discharge.  Patient to Follow up at: Follow-up Information   Follow up with [facility] On 05/06/2012. (You are scheduled with [facility] on Thursday, May 06, 2012 at 7;45 AM.  Referral # [id number])    Contact information:   [address] [place], [place]   [address]  [phone]      Patient denies SI/HI:   Yes,  Patient is not endorsing SI/HI or thoughts of self harm.    Safety Planning and Suicide Prevention discussed:  Yes,  Reviewed during aftercare care.  Wynn Banker 05/04/2012, 1:14 PM

## 2012-05-04 NOTE — Progress Notes (Signed)
Patient ID: Brittany Blair, female   DOB: 1960/07/23, 52 y.o.   MRN: [id number] Patient discharged home per MD order.  Patient received all personal belongings, medication samples and discharge instructions.  She left ambulatory with her husband without incident.  Patient was given patient satisfaction survey to complete.

## 2012-05-04 NOTE — [facility] Suicide Risk Assessment (Signed)
Suicide Risk Assessment  Discharge Assessment     Demographic Factors:  Female, Caucasian, married  Mental Status Per Nursing Assessment::   On Admission:  Suicidal ideation indicated by patient;Self-harm thoughts  Current Mental Status by Physician: Patient alert and oriented to 4. Denies AH/VH/SI/HI.  Loss Factors: NA  Historical Factors: Impulsivity  Risk Reduction Factors:   Sense of responsibility to family, Living with another person, especially a relative and Positive social support  Continued Clinical Symptoms:  Depression:   Recent sense of peace/wellbeing  Cognitive Features That Contribute To Risk:  Cognitively intact  Suicide Risk:  Minimal: No identifiable suicidal ideation.  Patients presenting with no risk factors but with morbid ruminations; may be classified as minimal risk based on the severity of the depressive symptoms  Discharge Diagnoses:   AXIS I:  Major Depression, Recurrent severe AXIS II:  Deferred AXIS III:   Past Medical History  Diagnosis Date  . Anxiety   . Herpes   . MRSA (methicillin resistant Staphylococcus aureus)   . Breast lump     left  . Hypertension   . Hematoma   . Swelling     left foot  . Multiple blisters     along surgical site  . Breast cancer    AXIS IV:  other psychosocial or environmental problems AXIS V:  61-70 mild symptoms  Plan Of Care/Follow-up recommendations:  Activity:  regular Diet:  low salt Follow up with appointments outpatient.  Is patient on multiple antipsychotic therapies at discharge:  No   Has Patient had three or more failed trials of antipsychotic monotherapy by history:  No  Recommended Plan for Multiple Antipsychotic Therapies: NA  Keymora Grillot 05/04/2012, 10:50 AM

## 2012-05-04 NOTE — Progress Notes (Signed)
Adult Psychoeducational Group Note  Date:  05/04/2012 Time:  1:22 PM  Group Topic/Focus:  Wellness Toolbox:   The focus of this group is to discuss various aspects of wellness, balancing those aspects and exploring ways to increase the ability to experience wellness.  Patients will create a wellness toolbox for use upon discharge.  Participation Level:  Active  Participation Quality:  Appropriate, Attentive and Sharing  Affect:  Appropriate  Cognitive:  Alert and Appropriate  Insight: Appropriate  Engagement in Group:  Engaged  Modes of Intervention:  Discussion  Additional Comments:  Pt was appropriate and attentive while attending group. Pt shared that she plans to stay on her medications and find a physical activity that she can participate in due to her limitations.   Sharyn Lull 05/04/2012, 1:22 PM

## 2012-05-04 NOTE — Tx Team (Signed)
Interdisciplinary Treatment Plan Update   Date Reviewed:  05/04/2012  Time Reviewed:  10:38 AM  Progress in Treatment:   Attending groups: Yes Participating in groups: Yes Taking medication as prescribed: Yes  Tolerating medication: Yes Family/Significant other contact made: Contact made with husband Patient understands diagnosis: Yes  Discussing patient identified problems/goals with staff: Yes Medical problems stabilized or resolved: Yes Denies suicidal/homicidal ideation: Yes Patient has not harmed self or others: Yes  For review of initial/current patient goals, please see plan of care.  Estimated Length of Stay:  Discharge home today.  Reasons for Continued Hospitalization:   New Problems/Goals identified:    Discharge Plan or Barriers:   Home with outpatient follow up at [facility]  Additional Comments:   Patient reports doing great today and ready to discharge home.  She rates all  Symptoms at zero.    Attendees:  Patient:   Brittany Blair 05/04/2012 10:38 AM   Signature: Patrick North, MD 05/04/2012 10:38 AM  Signature:Jan Delford Field, RN 05/04/2012 10:38 AM  Signature: 05/04/2012 10:38 AM  Signature:Beverly Terrilee Croak, RN 05/04/2012 10:38 AM  Signature:  Fransisca Kaufmann, [facility]  05/04/2012 10:38 AM  Signature:  Juline Patch, LCSW 05/04/2012 10:38 AM  Signature:  05/04/2012 10:38 AM  Signature:  05/04/2012 10:38 AM  Signature:    Signature:    Signature:    Signature:      Scribe for Treatment Team:   Juline Patch,  05/04/2012 10:38 AM

## 2012-05-04 NOTE — Progress Notes (Signed)
[facility] LCSW Group Therapy  05/04/2012 4:07 PM  Type of Therapy:  Group Therapy  Participation Level:  Active  Participation Quality:  Appropriate and Attentive  Affect:  Appropriate  Cognitive:  Alert and Appropriate  Insight:  Engaged  Engagement in Therapy:  Engaged  Modes of Intervention:  Education, Exploration, Problem-solving and Rapport Building  Summary of Progress/Problems:  Patient shared the obstacles she has to overcome is negative thinking and not communicating. Patient shared she will be changing her way of thinking and will be selfish when it comes to her well being.  Wynn Banker 05/04/2012, 4:07 PM

## 2012-05-04 NOTE — Progress Notes (Addendum)
Writer observed patient up and active in the dayroom. Patient requested help with putting on her compression sleeve for her left arm swelling. Her husband brought this in on today. Patient reports that her day has been better and has enjoyed the groups she attended today.Support and encouragement offered.  Patient voiced no other complaints, denies si/hi/a/v hallucinations. Safety maintained with 15 min checks, will continue to monitor.
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
## 2012-05-05 NOTE — Discharge Summary (Signed)
Reviewed

## 2012-05-08 NOTE — Progress Notes (Signed)
Patient Discharge Instructions:  After Visit Summary (AVS):   Faxed to:  05/08/12 Discharge Summary Note:   Faxed to:  05/08/12 Psychiatric Admission Assessment Note:   Faxed to:  05/08/12 Suicide Risk Assessment - Discharge Assessment:   Faxed to:  05/08/12 Faxed/Sent to the Next Level Care provider:  05/08/12 Faxed to [facility] @ [phone]  Jerelene Redden, 05/08/2012, 2:45 PM

## 2012-05-15 ENCOUNTER — Ambulatory Visit: Payer: Self-pay

## 2012-05-15 ENCOUNTER — Other Ambulatory Visit: Payer: Self-pay | Admitting: Lab

## 2012-05-15 ENCOUNTER — Ambulatory Visit (HOSPITAL_COMMUNITY): Payer: Self-pay

## 2012-05-18 ENCOUNTER — Ambulatory Visit (HOSPITAL_COMMUNITY): Payer: Self-pay | Admitting: Oncology

## 2012-05-22 ENCOUNTER — Encounter (HOSPITAL_COMMUNITY): Payer: Self-pay | Attending: Oncology | Admitting: Oncology

## 2012-05-22 ENCOUNTER — Encounter (HOSPITAL_COMMUNITY): Payer: Self-pay | Admitting: Oncology

## 2012-05-22 ENCOUNTER — Encounter (HOSPITAL_COMMUNITY): Payer: Self-pay

## 2012-05-22 VITALS — BP 125/92 | HR 93 | Resp 18 | Wt 179.6 lb

## 2012-05-22 DIAGNOSIS — C50319 Malignant neoplasm of lower-inner quadrant of unspecified female breast: Secondary | ICD-10-CM

## 2012-05-22 DIAGNOSIS — I890 Lymphedema, not elsewhere classified: Secondary | ICD-10-CM

## 2012-05-22 DIAGNOSIS — B379 Candidiasis, unspecified: Secondary | ICD-10-CM

## 2012-05-22 DIAGNOSIS — R071 Chest pain on breathing: Secondary | ICD-10-CM

## 2012-05-22 DIAGNOSIS — C773 Secondary and unspecified malignant neoplasm of axilla and upper limb lymph nodes: Secondary | ICD-10-CM

## 2012-05-22 DIAGNOSIS — C50312 Malignant neoplasm of lower-inner quadrant of left female breast: Secondary | ICD-10-CM

## 2012-05-22 MED ORDER — HYDROCODONE-ACETAMINOPHEN 5-325 MG PO TABS: 1.0000 | ORAL_TABLET | Freq: Four times a day (QID) | ORAL | Status: DC | PRN

## 2012-05-22 MED ORDER — AMLODIPINE BESYLATE 5 MG PO TABS: 5.0000 mg | ORAL_TABLET | Freq: Two times a day (BID) | ORAL | Status: DC

## 2012-05-22 MED ORDER — TAMOXIFEN CITRATE 10 MG PO TABS: 10.0000 mg | ORAL_TABLET | Freq: Two times a day (BID) | ORAL | Status: DC

## 2012-05-22 MED ORDER — NYSTATIN 100000 UNIT/GM EX POWD: Freq: Four times a day (QID) | CUTANEOUS | Status: DC

## 2012-05-22 NOTE — Patient Instructions (Addendum)
[facility] Discharge Instructions  RECOMMENDATIONS MADE BY THE CONSULTANT AND ANY TEST RESULTS WILL BE SENT TO YOUR REFERRING PHYSICIAN.  EXAM FINDINGS BY THE PHYSICIAN TODAY AND SIGNS OR SYMPTOMS TO REPORT TO CLINIC OR PRIMARY PHYSICIAN: Exam and discussion by MD.  Bonita Quin need to get to physical therapy to help improve your muscles and decrease the swelling in your arm. You have a yeast infection at your surgical site and in your groin areas.  Keep the areas clean and dry and use the Nystatin powder on the areas.  Wear cotton underwear.     MEDICATIONS PRESCRIBED:  Increase your amlodipine to twice daily (one pill in the am and one pill in the pm) Continue logging your BP Stop the Arimidex and start the Tamoxifen and take 10 mg twice daily Nystatin powder - apply light dusting to clean dry areas and you can do this 3 - 4 times daily. Refill for your hydrocodone - take as directed.  INSTRUCTIONS GIVEN AND DISCUSSED: Call us in 2 weeks and let us know how you are doing with the tamoxifen and with your BP.  Report new lumps, increased bone pain or other problems.  SPECIAL INSTRUCTIONS/FOLLOW-UP: Return for follow-up in 8 weeks.  Thank you for choosing [facility] to provide your oncology and hematology care.  To afford each patient quality time with our providers, please arrive at least 15 minutes before your scheduled appointment time.  With your help, our goal is to use those 15 minutes to complete the necessary work-up to ensure our physicians have the information they need to help with your evaluation and healthcare recommendations.    Effective January 1st, 2014, we ask that you re-schedule your appointment with our physicians should you arrive 10 or more minutes late for your appointment.  We strive to give you quality time with our providers, and arriving late affects you and other patients whose appointments are after yours.    Again, thank you for  choosing [facility].  Our hope is that these requests will decrease the amount of time that you wait before being seen by our physicians.       _____________________________________________________________  Should you have questions after your visit to [facility], please contact our office at [phone] between the hours of 8:30 a.m. and 5:00 p.m.  Voicemails left after 4:30 p.m. will not be returned until the following business day.  For prescription refill requests, have your pharmacy contact our office with your prescription refill request.

## 2012-05-22 NOTE — Progress Notes (Signed)
#  1 left-sided infiltrating ductal carcinoma, grade 3, stage IIIA (probably downsized from stage IIIB with what sounds like a T4 B. cancer presentation). Tumor was growing through the skin she states. She was given preoperative chemotherapy with Taxotere x3 cycles in a dose dense fashion followed by 2 cycles of dose dense FEC. Her chemotherapy was interrupted by severe MRSA infection prompting discontinuation. At the time of definitive surgery she had a 4.5 cm cancer with LV I and 7 of 8 positive lymph nodes. Her cancer was estrogen receptor +61%, progesterone receptor +100%, HER-2/neu nonamplified, Ki-67 marker high at 75% #2 hypertension and her blood pressure is not well controlled so we will increase her Norvasc 5 mg twice a day and she will continue check her blood pressures over the next 2 weeks and let us know how they're doing. She will continue the lisinopril as it is. #3 chronic left chest wall and axillary pain since the surgery #4 candidiasis the left axilla, right inframammary fold, both inguinal areas and we will treat her with soap and water cleansing followed by nystatin powder for 2 to 4 times per day for 2-4 weeks. #5 history tubal ligation #6 history smoking half a pack a day starting is 26 quitting 2006 #8 h/o depression with hospitalization recently.  She is here today with her husband. She is having tremendous muscle and joint aching with the Arimidex. We'll therefore stop that and stop the goserelin.  We will transfer her to tamoxifen which she took before her only a few days because she ought it made her very very sleepy. We will start with 10 mg for 2 weeks and then increase it to 10 mg twice a day or 20 mg at bedtime if it causes her to be sleepy.  She is very concerned about the left chest wall and axillary discomfort. She does have mild lymphedema the left arm but has not seen a physical therapist Jed. She needs to do that. The anterior fat near the anterior axillary area is  slightly edematous as well. The lymphedema left arm is mild.  There is a questionable left supraclavicular lymph node proximal 9 to 10 mm. Had not felt this before and it is not pulsatile artery or a vessel therefore I want her to come back in 8 weeks for reevaluation.  Her lungs were clear today. He inframammary fold and left axilla crease had candida along with or inguinal areas. She has no hepatosplenomegaly or abdominal exam. Bowel sounds are diminished. Right breast is negative for masses. Heart showed regular rhythm and rate without murmur rub or gallop. She no leg edema.  She was alert and oriented and relieved to know that we did try the tamoxifen rather than continue on with Arimidex and goserelin.  We'll see her back in 8 weeks. I am very concerned about this lady since she is at very high risk of recurrent disease.

## 2012-06-05 ENCOUNTER — Telehealth (HOSPITAL_COMMUNITY): Payer: Self-pay

## 2012-06-05 ENCOUNTER — Other Ambulatory Visit (HOSPITAL_COMMUNITY): Payer: Self-pay

## 2012-06-05 ENCOUNTER — Ambulatory Visit (HOSPITAL_COMMUNITY): Admission: RE | Admit: 2012-06-05 | Payer: Self-pay | Source: Ambulatory Visit | Admitting: Physical Therapy

## 2012-06-05 DIAGNOSIS — C50312 Malignant neoplasm of lower-inner quadrant of left female breast: Secondary | ICD-10-CM

## 2012-06-05 DIAGNOSIS — B379 Candidiasis, unspecified: Secondary | ICD-10-CM

## 2012-06-05 MED ORDER — METOPROLOL SUCCINATE ER 25 MG PO TB24: 75.0000 mg | ORAL_TABLET | Freq: Every day | ORAL | Status: DC

## 2012-06-05 NOTE — Telephone Encounter (Signed)
Message copied by Evelena Leyden on Fri Jun 05, 2012 11:59 AM ------      Message from: Mariel Sleet, ERIC S      Created: Fri Jun 05, 2012 11:39 AM       Refill her med and let's see the log ------

## 2012-06-05 NOTE — Telephone Encounter (Signed)
Call from patient stating "my BP is still all over the place and I will bring the log in for you to see today.  I need a refill for my Metoprolol 25 mg that I take 3 times daily for total of 75mg  called in to  [facility] as I don't have enough to get through the weekend."

## 2012-06-05 NOTE — Telephone Encounter (Signed)
Message left for patient that we received the BP log and Dr. Mariel Sleet is not going to make any changes but would like for her to just take and record her BP twice daily at the same time each day.  To call back with any questions.

## 2012-06-05 NOTE — Telephone Encounter (Signed)
Message left for patient that refills for Metoprolol were e-scribed to [facility] and that Dr. Mariel Sleet does want to see the log.

## 2012-06-15 ENCOUNTER — Ambulatory Visit (HOSPITAL_COMMUNITY): Admission: RE | Admit: 2012-06-15 | Payer: Self-pay | Source: Ambulatory Visit | Admitting: Physical Therapy

## 2012-06-24 ENCOUNTER — Telehealth (HOSPITAL_COMMUNITY): Payer: Self-pay

## 2012-06-24 ENCOUNTER — Other Ambulatory Visit (HOSPITAL_COMMUNITY): Payer: Self-pay

## 2012-06-24 MED ORDER — LISINOPRIL 40 MG PO TABS: 40.0000 mg | ORAL_TABLET | Freq: Every day | ORAL | Status: DC

## 2012-06-24 NOTE — Telephone Encounter (Signed)
Call from Ezrah, requesting refill for Lisinopril 40 mg.  Uses [facility].  Also, wanted Dr. Mariel Sleet that "all is going well for now".

## 2012-06-24 NOTE — Telephone Encounter (Signed)
Refills e-scribed to her pharmacy and patient notified.

## 2012-06-24 NOTE — Telephone Encounter (Signed)
Message copied by Evelena Leyden on Wed Jun 24, 2012  5:55 PM ------      Message from: Mariel Sleet, ERIC S      Created: Wed Jun 24, 2012  5:27 PM       Please refill x 3(lisinopril) ------

## 2012-07-06 ENCOUNTER — Telehealth (HOSPITAL_COMMUNITY): Payer: Self-pay

## 2012-07-06 NOTE — Telephone Encounter (Signed)
Message left on voice mail from [place] on Saturday.  Stated "I want to see if I can go ahead and get my other breast removed.  I'm just not comfortable the way I am now.  I don't have any desire to have reconstruction."  Spoke with Sueellen and says that the reason she wants the other breast removed is because it's hard to find anything to fit, difficult to wear a bra due to lymphedema in back area.   Sees Dr. Mariel Sleet on 07/22/12.

## 2012-07-07 ENCOUNTER — Telehealth (HOSPITAL_COMMUNITY): Payer: Self-pay

## 2012-07-07 NOTE — Telephone Encounter (Signed)
Left message for patient to call regarding appointment either today @ 12 noon or tomorrow @ 3pm.

## 2012-07-08 ENCOUNTER — Other Ambulatory Visit (HOSPITAL_COMMUNITY): Payer: Self-pay

## 2012-07-08 ENCOUNTER — Ambulatory Visit (HOSPITAL_COMMUNITY): Payer: Self-pay | Admitting: Oncology

## 2012-07-08 MED ORDER — TRAZODONE HCL 100 MG PO TABS: 100.0000 mg | ORAL_TABLET | Freq: Every evening | ORAL | Status: DC | PRN

## 2012-07-21 ENCOUNTER — Ambulatory Visit (HOSPITAL_COMMUNITY): Payer: Self-pay | Admitting: Oncology

## 2012-07-22 ENCOUNTER — Encounter (HOSPITAL_COMMUNITY): Payer: Self-pay | Attending: Oncology | Admitting: Oncology

## 2012-07-22 ENCOUNTER — Encounter (HOSPITAL_COMMUNITY): Payer: Self-pay | Admitting: Dietician

## 2012-07-22 VITALS — BP 132/80 | HR 88 | Temp 98.3°F | Resp 18 | Wt 179.4 lb

## 2012-07-22 DIAGNOSIS — F329 Major depressive disorder, single episode, unspecified: Secondary | ICD-10-CM

## 2012-07-22 DIAGNOSIS — D0592 Unspecified type of carcinoma in situ of left breast: Secondary | ICD-10-CM

## 2012-07-22 DIAGNOSIS — C50319 Malignant neoplasm of lower-inner quadrant of unspecified female breast: Secondary | ICD-10-CM

## 2012-07-22 DIAGNOSIS — B372 Candidiasis of skin and nail: Secondary | ICD-10-CM

## 2012-07-22 DIAGNOSIS — I959 Hypotension, unspecified: Secondary | ICD-10-CM

## 2012-07-22 NOTE — Progress Notes (Signed)
Nutrition Note  Pt identified for weight loss on [facility] Nutrition Screen. Noted MST score of 3.   Wt Readings from Last 30 Encounters:  07/22/12 179 lb 6.4 oz (81.375 kg)  05/22/12 179 lb 9.6 oz (81.466 kg)  04/29/12 180 lb (81.647 kg)  04/28/12 191 lb 12.8 oz (87 kg)  02/17/12 169 lb 12.8 oz (77.021 kg)  01/10/12 164 lb 12.8 oz (74.753 kg)  12/27/11 166 lb 8 oz (75.524 kg)  11/04/11 166 lb 4.8 oz (75.433 kg)  09/27/11 170 lb 14.4 oz (77.52 kg)  08/30/11 164 lb 9.6 oz (74.662 kg)  08/16/11 160 lb (72.576 kg)  06/27/11 157 lb 4.8 oz (71.351 kg)  05/17/11 152 lb 8 oz (69.174 kg)  03/05/11 147 lb 6.4 oz (66.86 kg)  02/04/11 144 lb (65.318 kg)  01/28/11 146 lb 9.6 oz (66.497 kg)  01/21/11 144 lb 1.6 oz (65.363 kg)  01/16/11 144 lb 1.6 oz (65.363 kg)  01/14/11 143 lb 8 oz (65.091 kg)  01/07/11 141 lb 8 oz (64.184 kg)  01/02/11 139 lb 11.2 oz (63.368 kg)  11/13/10 135 lb 3.2 oz (61.326 kg)   Chart reviewed. Pt followed by [facility] for stage IIIA breast cancer, currently receiving chemo. S/p radiation. Also noted hx of psychiatric illness; pt recently d/c from [facility] on 3/14 due to severe depression. MD notes reveal pt also with low self-esteem.  Wt hx reveals steady increase over the past 1.5 years. UBW: 144#. Pt with 22# (14%) wt gain x 1 year, 15# (9.1%) wt gain x 6 months. Weight has been stable over the past 2 months.  No nutrition recommendations at this time.  If further nutrition concerns arise, please consult RD.  Melody Haver, RD, LDN Pager: [phone]

## 2012-07-22 NOTE — Patient Instructions (Addendum)
.  [facility] Discharge Instructions  RECOMMENDATIONS MADE BY THE CONSULTANT AND ANY TEST RESULTS WILL BE SENT TO YOUR REFERRING PHYSICIAN.  EXAM FINDINGS BY THE PHYSICIAN TODAY AND SIGNS OR SYMPTOMS TO REPORT TO CLINIC OR PRIMARY PHYSICIAN: Exam per Dr. Mariel Sleet  MEDICATIONS PRESCRIBED:  Decrease the norvasc to 1 a day  INSTRUCTIONS GIVEN AND DISCUSSED: Wash with soap and water, use hair dryer to dry on cool setting Nystatin 4 x a day Cornstarch over the nystatin 2-3 x a day Cotton shirts and loose fitting clothes  SPECIAL INSTRUCTIONS/FOLLOW-UP:  see [place] back in 3 months  Thank you for choosing [facility] to provide your oncology and hematology care.  To afford each patient quality time with our providers, please arrive at least 15 minutes before your scheduled appointment time.  With your help, our goal is to use those 15 minutes to complete the necessary work-up to ensure our physicians have the information they need to help with your evaluation and healthcare recommendations.    Effective January 1st, 2014, we ask that you re-schedule your appointment with our physicians should you arrive 10 or more minutes late for your appointment.  We strive to give you quality time with our providers, and arriving late affects you and other patients whose appointments are after yours.    Again, thank you for choosing [facility].  Our hope is that these requests will decrease the amount of time that you wait before being seen by our physicians.       _____________________________________________________________  Should you have questions after your visit to [facility], please contact our office at [phone] between the hours of 8:30 a.m. and 5:00 p.m.  Voicemails left after 4:30 p.m. will not be returned until the following business day.  For prescription refill requests, have your pharmacy contact our office with your prescription  refill request.

## 2012-07-22 NOTE — Progress Notes (Signed)
#  1 infiltrating ductal carcinoma the left breast, grade 3, stage IIIA (downside is from probable stage IIIB with what sounds like a T4 B. cancer at presentation). Her cancer was going through the skin she states. She was given Taxotere x3 cycles preoperatively in a dose dense fashion followed by 2 cycles of dose dense FEC. Her chemotherapy was interrupted because of the severe MRSA infection prompting discontinuation. At the time of definitive surgery she still had a 4.5 cm cancer with LV I and 7 of 8 positive lymph nodes. ER receptors 61% positive, PR receptors 100% positive, HER-2/neu nonamplified, Ki-67 are high at 75%.  #2 hypertension much better controlled and indeed she's had low blood pressure and to near syncopal episodes and we will change your Norvasc to just once a day and leave her other medications alone and have her call us back in 2 weeks with more blood pressure readings and let us know she's had any other changes.  #3 chronic left chest wall and axillary pain since surgery #4 candidiasis in the left axilla at decrease and the right inframammary fold medially. She occasionally has both inguinal areas involved as well. She has been using nystatin powder twice a which we will increase to 4 times a day after soap and water and then patting the area dry and she will try using a hair dryer. She will then apply the nystatin powder and some corn starch. This does not work, unfortunately we cannot send her to a dermatologist because of lack of insurance. I do not want to try her on Lotrisone cream because of the thinning of the skin possibility. We may have to try however for short burst versus Diflucan.  #5 significant depression #6 hot flashes which contribute to her sweating during the night in the moisture in the left axilla right breast and groin areas. #7 history smoking one half pack a day starting at age 6 and quitting in 2006  She is here today with her husband to go over how she has  done with the nystatin and for a followup checkup physically. There was a question of a left supraclavicular lymph node when I saw her last. I think she has a thickened area only in this left supraclavicular fossa that's small nonspecific not easy to measure and I suspect may be related to radiation therapy. Otherwise she has no other lymphadenopathy areas found whatsoever. The left axillary crease still has a rash partially 5 cm x 2.5 cm in the crease consistent with candidiasis. She has a similar rash medial inframammary fold. The right breast has no masses. Lungs are clear. She still is mildly depressed intermittently when she talks about her sense of self-esteem and how she cannot go out in public. Abdomen is soft and nontender. Heart shows a regular rhythm and rate without murmur rub or gallop. She has no leg edema but she does have arm edema on the left.  She states she cannot wear a bra due to the pain in the left posterior axillary and anterior axillary areas. We need to work on her self-esteem and I've started to do that with her.  She is continue her tamoxifen.  I don't think you need any blood work today. She is to see [place] in 3 months. He'll call us in 2 weeks for the blood pressure checks and let us know how the rash is doing

## 2012-08-04 ENCOUNTER — Other Ambulatory Visit (HOSPITAL_COMMUNITY): Payer: Self-pay | Admitting: Oncology

## 2012-08-04 ENCOUNTER — Telehealth (HOSPITAL_COMMUNITY): Payer: Self-pay

## 2012-08-04 ENCOUNTER — Encounter (HOSPITAL_COMMUNITY): Payer: Self-pay | Admitting: Oncology

## 2012-08-04 DIAGNOSIS — A6000 Herpesviral infection of urogenital system, unspecified: Secondary | ICD-10-CM | POA: Insufficient documentation

## 2012-08-04 DIAGNOSIS — C50312 Malignant neoplasm of lower-inner quadrant of left female breast: Secondary | ICD-10-CM

## 2012-08-04 DIAGNOSIS — B379 Candidiasis, unspecified: Secondary | ICD-10-CM

## 2012-08-04 HISTORY — DX: Herpesviral infection of urogenital system, unspecified: A60.00

## 2012-08-04 MED ORDER — ACYCLOVIR 400 MG PO TABS: 400.0000 mg | ORAL_TABLET | Freq: Two times a day (BID) | ORAL | Status: DC

## 2012-08-04 NOTE — Telephone Encounter (Signed)
Call from patient requesting refill for Acyclovir 400 mg.  Takes 1 daily.  Uses [facility].

## 2012-08-04 NOTE — Telephone Encounter (Signed)
We need to find out why she is requesting Acyclovir.  We have never prescribed her that medication.

## 2012-08-04 NOTE — Telephone Encounter (Signed)
Per patient she is on Acyclovir to prevent recurrence of genital herpes.

## 2012-08-11 ENCOUNTER — Other Ambulatory Visit (HOSPITAL_COMMUNITY): Payer: Self-pay | Admitting: Oncology

## 2012-08-11 ENCOUNTER — Telehealth (HOSPITAL_COMMUNITY): Payer: Self-pay

## 2012-08-11 DIAGNOSIS — C50312 Malignant neoplasm of lower-inner quadrant of left female breast: Secondary | ICD-10-CM

## 2012-08-11 DIAGNOSIS — B379 Candidiasis, unspecified: Secondary | ICD-10-CM

## 2012-08-11 MED ORDER — HYDROCODONE-ACETAMINOPHEN 5-325 MG PO TABS: 1.0000 | ORAL_TABLET | Freq: Four times a day (QID) | ORAL | Status: DC | PRN

## 2012-08-11 NOTE — Telephone Encounter (Signed)
Call from patient requesting refill for hydrocodone and prescription for acyclovir.  Discussed with Dellis Anes, PA-C and prescription for acyclovir was called to [facility] on 6/10 and a refill for her Hydrocodone was called in today.  Patient notified.

## 2012-08-17 ENCOUNTER — Telehealth (HOSPITAL_COMMUNITY): Payer: Self-pay

## 2012-08-17 NOTE — Telephone Encounter (Signed)
Call from [facility] requesting refills for Effexor.  Stated that she is now taking 225 mg daily and has been on this dosage since discharged from [facility] in February.  Has been getting it free from [facility] but can no longer get it.  Wants prescription sent to Outpatient pharmacy @ [facility].

## 2012-08-18 ENCOUNTER — Other Ambulatory Visit (HOSPITAL_COMMUNITY): Payer: Self-pay

## 2012-08-18 DIAGNOSIS — F329 Major depressive disorder, single episode, unspecified: Secondary | ICD-10-CM

## 2012-08-18 DIAGNOSIS — C50312 Malignant neoplasm of lower-inner quadrant of left female breast: Secondary | ICD-10-CM

## 2012-08-18 DIAGNOSIS — B379 Candidiasis, unspecified: Secondary | ICD-10-CM

## 2012-08-18 MED ORDER — VENLAFAXINE HCL ER 75 MG PO CP24: ORAL_CAPSULE | ORAL | Status: DC

## 2012-09-07 ENCOUNTER — Other Ambulatory Visit (HOSPITAL_COMMUNITY): Payer: Self-pay | Admitting: Oncology

## 2012-10-12 ENCOUNTER — Other Ambulatory Visit (HOSPITAL_COMMUNITY): Payer: Self-pay | Admitting: Oncology

## 2012-10-20 ENCOUNTER — Telehealth (HOSPITAL_COMMUNITY): Payer: Self-pay | Admitting: Oncology

## 2012-10-21 ENCOUNTER — Other Ambulatory Visit (HOSPITAL_COMMUNITY): Payer: Self-pay | Admitting: Oncology

## 2012-10-22 ENCOUNTER — Telehealth (HOSPITAL_COMMUNITY): Payer: Self-pay | Admitting: Oncology

## 2012-10-22 ENCOUNTER — Other Ambulatory Visit (HOSPITAL_COMMUNITY): Payer: Self-pay | Admitting: Oncology

## 2012-10-22 DIAGNOSIS — F329 Major depressive disorder, single episode, unspecified: Secondary | ICD-10-CM

## 2012-10-22 DIAGNOSIS — F32A Depression, unspecified: Secondary | ICD-10-CM

## 2012-10-22 MED ORDER — ALPRAZOLAM 0.5 MG PO TABS: 0.5000 mg | ORAL_TABLET | Freq: Three times a day (TID) | ORAL | Status: DC | PRN

## 2012-10-23 ENCOUNTER — Ambulatory Visit (HOSPITAL_COMMUNITY): Payer: Self-pay

## 2012-10-30 ENCOUNTER — Encounter (HOSPITAL_COMMUNITY): Payer: Self-pay | Attending: Hematology and Oncology

## 2012-10-30 VITALS — BP 110/79 | HR 106 | Temp 98.4°F | Resp 18 | Wt 182.3 lb

## 2012-10-30 DIAGNOSIS — C773 Secondary and unspecified malignant neoplasm of axilla and upper limb lymph nodes: Secondary | ICD-10-CM

## 2012-10-30 DIAGNOSIS — C50312 Malignant neoplasm of lower-inner quadrant of left female breast: Secondary | ICD-10-CM

## 2012-10-30 DIAGNOSIS — C50119 Malignant neoplasm of central portion of unspecified female breast: Secondary | ICD-10-CM

## 2012-10-30 DIAGNOSIS — F329 Major depressive disorder, single episode, unspecified: Secondary | ICD-10-CM

## 2012-10-30 DIAGNOSIS — B372 Candidiasis of skin and nail: Secondary | ICD-10-CM

## 2012-10-30 MED ORDER — CLOTRIMAZOLE 1 % EX CREA: TOPICAL_CREAM | Freq: Two times a day (BID) | CUTANEOUS | Status: DC

## 2012-10-30 NOTE — Progress Notes (Signed)
[facility] [phone]   [phone]  OFFICE PROGRESS NOTE  Georgian Co, PA-C [address] [place] [address]   DIAGNOSIS: Infiltrating ductal carcinoma the left breast, grade 3, stage IIIA     ONCOLOGIC HISTORY: Per Dr Thornton Papas note; "infiltrating ductal carcinoma the left breast, grade 3, stage IIIA (downside is from probable stage IIIB with what sounds like a T4 B. cancer at presentation). Her cancer was going through the skin she states. She was given Taxotere x3 cycles preoperatively in a dose dense fashion followed by 2 cycles of dose dense FEC. Her chemotherapy was interrupted because of the severe MRSA infection prompting discontinuation. At the time of definitive surgery she still had a 4.5 cm cancer with LV I and 7 of 8 positive lymph nodes. ER receptors 61% positive, PR receptors 100% positive, HER-2/neu nonamplified, Ki-67 are high at 75%"  She was diagnosed with breast cancer on 06/05/2010 .   INTERVAL HISTORY:   Brittany Blair 52 y.o. female returns to the clinic today for scheduled follow up in the company of her husband.  He tells me that she was recently hospitalized for depression with suicidal attempt.  Reportedly her antidepressants was increased at that time but she states that she has decreased to150 mg (Effexor). This also helps with that flashes according to the patient.she is otherwise tolerating tamoxifen at 10 mg twice a day Will.  She was also on the trazodone but tells me she has stopped this now. She is eating well and denies any new breast lumps or shortness of breath.  Denies any weight loss or pain at this time. A mammogram last year showed evidence of fat necrosis in the right breast.  Patient states that the lump has resolved.    MEDICAL HISTORY: Past Medical History  Diagnosis Date  . Anxiety   . Herpes   . MRSA (methicillin resistant Staphylococcus aureus)   . Breast lump     left  . Hypertension   .  Hematoma   . Swelling     left foot  . Multiple blisters     along surgical site  . Breast cancer   . Depression   . Genital herpes 08/04/2012    ALLERGIES:  is allergic to bee venom.  MEDICATIONS:  Current Outpatient Prescriptions  Medication Sig Dispense Refill  . acyclovir (ZOVIRAX) 400 MG tablet Take 1 tablet (400 mg total) by mouth 2 (two) times daily.  60 tablet  3  . ALPRAZolam (XANAX) 0.5 MG tablet Take 1 tablet (0.5 mg total) by mouth 3 (three) times daily as needed for anxiety.  90 tablet  1  . amLODipine (NORVASC) 5 MG tablet Take 1 tablet (5 mg total) by mouth 2 (two) times daily.  60 tablet  4  . HYDROcodone-acetaminophen (NORCO/VICODIN) 5-325 MG per tablet Take 1 tablet by mouth every 6 (six) hours as needed for pain.  100 tablet  0  . lisinopril (PRINIVIL,ZESTRIL) 40 MG tablet TAKE 1 TABLET BY MOUTH DAILY.  30 tablet  3  . metoprolol succinate (TOPROL-XL) 25 MG 24 hr tablet TAKE 3 TABLETS BY MOUTH DAILY WITH OR IMMEDIATELY FOLLOWING A MEAL  90 tablet  2  . nicotine (NICODERM CQ) 14 mg/24hr patch Place 1 patch onto the skin daily.      . tamoxifen (NOLVADEX) 10 MG tablet Take 1 tablet (10 mg total) by mouth 2 (two) times daily.  60 tablet  6  . tetrahydrozoline-zinc (VISINE-AC) 0.05-0.25 %  ophthalmic solution Place 2 drops into both eyes 3 (three) times daily as needed (Red Eyes).      . venlafaxine XR (EFFEXOR-XR) 75 MG 24 hr capsule Take 75 mg by mouth daily. Taking 2 tablets daily      . clotrimazole (LOTRIMIN) 1 % cream Apply topically 2 (two) times daily.  45 g  0  . nystatin (MYCOSTATIN) powder Apply topically 4 (four) times daily.  30 g  2  . traZODone (DESYREL) 100 MG tablet Take 1 tablet (100 mg total) by mouth at bedtime and may repeat dose one time if needed.  45 tablet  2  . [DISCONTINUED] FLUoxetine (PROZAC) 20 MG tablet Take 40 mg by mouth daily.         No current facility-administered medications for this visit.    SURGICAL HISTORY:  Past Surgical  History  Procedure Laterality Date  . Tubal ligation    . Wisdom tooth extraction    . Mastectomy modified radical  10/10/10     left -Dr Jamey Ripa     REVIEW OF SYSTEMS: 14 point review of system is as in the history above otherwise negative.  PHYSICAL EXAMINATION:  Blood pressure 110/79, pulse 106, temperature 98.4 F (36.9 C), temperature source Oral, resp. rate 18, weight 182 lb 4.8 oz (82.691 kg), last menstrual period 06/13/2010. GENERAL: No acute distress. SKIN:  No rashes or significant lesions . No ecchymosis or petechial rash. HEAD: Normocephalic, No masses, lesions, tenderness or abnormalities  LYMPH: No palpable lymphadenopathy, in the neck, supraclavicular areas or axilla. BREAST: left breast lump it  without definite or suspicious mass lesion. Max mastectomy scar healed with therapy no lesion to suggest local recurrence. left Axillary skin and has erythematous rash consistent with Candida intertrigo. LUNGS: Clear to auscultation , no crackles or wheezes HEART: regular rate & rhythm, no murmurs, no gallops, S1 normal and S2 normal and no S1. ABDOMEN: Abdomen soft, non-tender, no masses or organomegaly and no hepatosplenomegaly palpable EXTREMITIES: No edema, no skin discoloration or tenderness NEURO: Alert & oriented , no focal motor  deficits.     LABORATORY DATA: Lab Results  Component Value Date   WBC 5.4 04/29/2012   HGB 12.2 04/29/2012   HCT 36.1 04/29/2012   MCV 99.4 04/29/2012   PLT 238 04/29/2012      Chemistry      Component Value Date/Time   NA 141 04/29/2012 2034   K 4.1 04/29/2012 2034   CL 104 04/29/2012 2034   CO2 25 04/29/2012 2034   BUN 10 04/29/2012 2034   CREATININE 0.66 04/29/2012 2034      Component Value Date/Time   CALCIUM 9.5 04/29/2012 2034   ALKPHOS 105 04/29/2012 2034   AST 41* 04/29/2012 2034   ALT 34 04/29/2012 2034   BILITOT 0.3 04/29/2012 2034       RADIOGRAPHIC STUDIES: No results found.   ASSESSMENT:  Patient has no evidence of disease as  regards her breast cancer at this time. Patient has a left axilla candida intertrigo. History of depression, stable at this time without suicidal ideation.  PLAN:  1. Continue tamoxifen, I agree the patient would be a candidate for 10 years of therapy given her stage.    2. I prescribed the clotrimazole 1% cream and instructed her to continue application one month after the rashes results and way to prevent reinfection.  3. She'll return to clinic in 3 months.  4. Routine yearly mammogram.  We discussed the routine blood work and  Imaging are not helpful in breast cancer in terms of improving outcome.  5. I advised patient to walk closely with a mental health care providers especially in adjusting doses of her medications in this regard.   All questions were satisfactorily answered. Patient knows to call if  any concern arises.  I spent more than 50 % counseling the patient face to face. The total time spent in the appointment was 30 minutes.   Sherral Hammers, MD FACP. Hematology/Oncology.

## 2012-10-30 NOTE — Patient Instructions (Addendum)
.  [facility] Discharge Instructions  RECOMMENDATIONS MADE BY THE CONSULTANT AND ANY TEST RESULTS WILL BE SENT TO YOUR REFERRING PHYSICIAN.  EXAM FINDINGS BY THE PHYSICIAN TODAY AND SIGNS OR SYMPTOMS TO REPORT TO CLINIC OR PRIMARY PHYSICIAN: Exam per Dr. Sharolyn Douglas are doing good   MEDICATIONS PRESCRIBED:  clotrimazole (LOTRIMIN) 1 % cream    SPECIAL INSTRUCTIONS/FOLLOW-UP: 3 months  Thank you for choosing [facility] to provide your oncology and hematology care.  To afford each patient quality time with our providers, please arrive at least 15 minutes before your scheduled appointment time.  With your help, our goal is to use those 15 minutes to complete the necessary work-up to ensure our physicians have the information they need to help with your evaluation and healthcare recommendations.    Effective January 1st, 2014, we ask that you re-schedule your appointment with our physicians should you arrive 10 or more minutes late for your appointment.  We strive to give you quality time with our providers, and arriving late affects you and other patients whose appointments are after yours.    Again, thank you for choosing [facility].  Our hope is that these requests will decrease the amount of time that you wait before being seen by our physicians.       _____________________________________________________________  Should you have questions after your visit to [facility], please contact our office at [phone] between the hours of 8:30 a.m. and 5:00 p.m.  Voicemails left after 4:30 p.m. will not be returned until the following business day.  For prescription refill requests, have your pharmacy contact our office with your prescription refill request.

## 2012-11-09 ENCOUNTER — Other Ambulatory Visit (HOSPITAL_COMMUNITY): Payer: Self-pay | Admitting: Oncology

## 2012-11-09 ENCOUNTER — Telehealth (HOSPITAL_COMMUNITY): Payer: Self-pay | Admitting: Oncology

## 2012-12-15 ENCOUNTER — Other Ambulatory Visit (HOSPITAL_COMMUNITY): Payer: Self-pay | Admitting: Oncology

## 2012-12-21 ENCOUNTER — Other Ambulatory Visit (HOSPITAL_COMMUNITY): Payer: Self-pay | Admitting: Oncology

## 2013-01-04 ENCOUNTER — Telehealth (HOSPITAL_COMMUNITY): Payer: Self-pay | Admitting: *Deleted

## 2013-01-04 ENCOUNTER — Other Ambulatory Visit (HOSPITAL_COMMUNITY): Payer: Self-pay | Admitting: Oncology

## 2013-01-04 DIAGNOSIS — F329 Major depressive disorder, single episode, unspecified: Secondary | ICD-10-CM

## 2013-01-04 DIAGNOSIS — C50319 Malignant neoplasm of lower-inner quadrant of unspecified female breast: Secondary | ICD-10-CM

## 2013-01-04 DIAGNOSIS — F32A Depression, unspecified: Secondary | ICD-10-CM

## 2013-01-04 MED ORDER — ALPRAZOLAM 0.5 MG PO TABS: 0.5000 mg | ORAL_TABLET | Freq: Three times a day (TID) | ORAL | Status: DC | PRN

## 2013-01-04 NOTE — Telephone Encounter (Signed)
Patient reports lump in breast.  She is due for a mammogram this month, therefore, I have ordered a diagnostic mammogram and we will see her back as scheduled, sooner depending on mammogram results.  She also requests a refill on Xanax and this has been called in to her pharmacy.   Brittany Blair

## 2013-01-14 ENCOUNTER — Encounter (HOSPITAL_COMMUNITY): Payer: Self-pay

## 2013-01-25 ENCOUNTER — Other Ambulatory Visit (HOSPITAL_COMMUNITY): Payer: Self-pay | Admitting: Oncology

## 2013-01-27 ENCOUNTER — Other Ambulatory Visit (HOSPITAL_COMMUNITY): Payer: Self-pay | Admitting: Oncology

## 2013-01-27 ENCOUNTER — Ambulatory Visit (HOSPITAL_COMMUNITY)
Admission: RE | Admit: 2013-01-27 | Discharge: 2013-01-27 | Disposition: A | Discharge status: Home / Self Care | Payer: Self-pay | Source: Ambulatory Visit | Attending: Oncology | Admitting: Oncology

## 2013-01-27 DIAGNOSIS — C50319 Malignant neoplasm of lower-inner quadrant of unspecified female breast: Secondary | ICD-10-CM

## 2013-01-27 DIAGNOSIS — N63 Unspecified lump in unspecified breast: Secondary | ICD-10-CM | POA: Insufficient documentation

## 2013-01-29 ENCOUNTER — Encounter (HOSPITAL_COMMUNITY): Payer: Self-pay | Attending: Hematology and Oncology

## 2013-01-29 ENCOUNTER — Encounter (HOSPITAL_COMMUNITY): Payer: Self-pay

## 2013-01-29 VITALS — BP 106/70 | HR 98 | Temp 97.9°F | Resp 16 | Wt 186.7 lb

## 2013-01-29 DIAGNOSIS — A6000 Herpesviral infection of urogenital system, unspecified: Secondary | ICD-10-CM | POA: Insufficient documentation

## 2013-01-29 DIAGNOSIS — B368 Other specified superficial mycoses: Secondary | ICD-10-CM

## 2013-01-29 DIAGNOSIS — C50319 Malignant neoplasm of lower-inner quadrant of unspecified female breast: Secondary | ICD-10-CM | POA: Insufficient documentation

## 2013-01-29 DIAGNOSIS — C50312 Malignant neoplasm of lower-inner quadrant of left female breast: Secondary | ICD-10-CM

## 2013-01-29 DIAGNOSIS — F3289 Other specified depressive episodes: Secondary | ICD-10-CM | POA: Insufficient documentation

## 2013-01-29 DIAGNOSIS — F329 Major depressive disorder, single episode, unspecified: Secondary | ICD-10-CM | POA: Insufficient documentation

## 2013-01-29 DIAGNOSIS — R232 Flushing: Secondary | ICD-10-CM

## 2013-01-29 DIAGNOSIS — N951 Menopausal and female climacteric states: Secondary | ICD-10-CM | POA: Insufficient documentation

## 2013-01-29 LAB — CBC WITH DIFFERENTIAL/PLATELET
Basophils Absolute: 0 10*3/uL (ref 0.0–0.1)
Basophils Relative: 0 % (ref 0–1)
HCT: 36.7 % (ref 36.0–46.0)
Lymphs Abs: 1.4 10*3/uL (ref 0.7–4.0)
MCHC: 33 g/dL (ref 30.0–36.0)
Monocytes Absolute: 0.6 10*3/uL (ref 0.1–1.0)
Neutro Abs: 2.5 10*3/uL (ref 1.7–7.7)
Neutrophils Relative %: 51 % (ref 43–77)
Platelets: 313 10*3/uL (ref 150–400)
RDW: 13.5 % (ref 11.5–15.5)

## 2013-01-29 LAB — COMPREHENSIVE METABOLIC PANEL
ALT: 41 U/L — ABNORMAL HIGH (ref 0–35)
AST: 62 U/L — ABNORMAL HIGH (ref 0–37)
Albumin: 3.9 g/dL (ref 3.5–5.2)
BUN: 17 mg/dL (ref 6–23)
CO2: 28 mEq/L (ref 19–32)
Calcium: 9.3 mg/dL (ref 8.4–10.5)
Creatinine, Ser: 1.14 mg/dL — ABNORMAL HIGH (ref 0.50–1.10)
GFR calc Af Amer: 62 mL/min — ABNORMAL LOW (ref >90)
GFR calc non Af Amer: 54 mL/min — ABNORMAL LOW (ref >90)
Glucose, Bld: 94 mg/dL (ref 70–99)
Sodium: 136 mEq/L (ref 135–145)
Total Protein: 7.4 g/dL (ref 6.0–8.3)

## 2013-01-29 MED ORDER — TRAZODONE HCL 50 MG PO TABS: 50.0000 mg | ORAL_TABLET | Freq: Every day | ORAL | Status: DC

## 2013-01-29 NOTE — Progress Notes (Signed)
[facility]  OFFICE PROGRESS NOTE  [place], PA-C [address] [place] [address]  DIAGNOSIS: Cancer of lower-inner quadrant of female breast, left - Plan: CBC with Differential, Comprehensive metabolic panel, CEA, Cancer antigen 27.29, Follicle stimulating hormone, Luteinizing hormone, Estradiol, CBC with Differential, Comprehensive metabolic panel, CEA, Cancer antigen 27.29, Follicle stimulating hormone, Luteinizing hormone, Estradiol, Estradiol, Estradiol  Hot flashes - Plan: CBC with Differential, Comprehensive metabolic panel, CEA, Cancer antigen 27.29, Follicle stimulating hormone, Luteinizing hormone, Estradiol, CBC with Differential, Comprehensive metabolic panel, CEA, Cancer antigen 27.29, Follicle stimulating hormone, Luteinizing hormone, Estradiol, Estradiol, Estradiol  Genital herpes - Plan: CBC with Differential, Comprehensive metabolic panel, CEA, Cancer antigen 27.29, Follicle stimulating hormone, Luteinizing hormone, Estradiol, CBC with Differential, Comprehensive metabolic panel, CEA, Cancer antigen 27.29, Follicle stimulating hormone, Luteinizing hormone, Estradiol, Estradiol, Estradiol  Depression - Plan: CBC with Differential, Comprehensive metabolic panel, CEA, Cancer antigen 27.29, Follicle stimulating hormone, Luteinizing hormone, Estradiol, CBC with Differential, Comprehensive metabolic panel, CEA, Cancer antigen 27.29, Follicle stimulating hormone, Luteinizing hormone, Estradiol, Estradiol, Estradiol  Chief Complaint  Patient presents with  . Breast Cancer    CURRENT THERAPY: Tamoxifen 10 mg twice a day.  INTERVAL HISTORY: Brittany Blair 52 y.o. female returns for followup of locally advanced left breast cancer, ER positive, treated in the past with preoperative chemotherapy, modified radical mastectomy, postoperative chemotherapy, radiotherapy, currently taking tamoxifen 10 mg twice a day.  Primary problem has  been hot flashes both day and night. Effexor was not accomplishing its intended goal. Appetite has been good with no nausea, vomiting, diarrhea, constipation, melena, hematochezia, hematuria, lower extremity swelling or redness, chest pain, PND, orthopnea, palpitations, skin rash, headache, or seizures. She denies any vaginal discharge, vaginal bleeding, dysuria, hematuria, incontinence, easy satiety, cough, wheezing, or epistaxis. She's also had a total sleeping and will be started in the near future on Desiryl.  MEDICAL HISTORY: Past Medical History  Diagnosis Date  . Anxiety   . Herpes   . MRSA (methicillin resistant Staphylococcus aureus)   . Breast lump     left  . Hypertension   . Hematoma   . Swelling     left foot  . Multiple blisters     along surgical site  . Breast cancer   . Depression   . Genital herpes 08/04/2012    INTERIM HISTORY: has Breast Cancer, IDC,Left, central, Stage III, receptor + Her2 -; Syncope; GAD (generalized anxiety disorder); HTN (hypertension), benign; Chronic chest pain; Depression; Transaminitis; and Genital herpes on her problem list.   "infiltrating ductal carcinoma the left breast, grade 3, stage IIIA (downside is from probable stage IIIB with what sounds like a T4 B. cancer at presentation). Her cancer was going through the skin she states. She was given Taxotere x3 cycles preoperatively in a dose dense fashion followed by 2 cycles of dose dense FEC. Her chemotherapy was interrupted because of the severe MRSA infection prompting discontinuation. At the time of definitive surgery she still had a 4.5 cm cancer with LV I and 7 of 8 positive lymph nodes. ER receptors 61% positive, PR receptors 100% positive, HER-2/neu nonamplified, Ki-67 are high at 75%"  She was diagnosed with breast cancer on 06/05/2010   ALLERGIES:  is allergic to bee venom.  MEDICATIONS: has a current medication list which includes the following prescription(s): acyclovir, alprazolam,  amlodipine, clotrimazole, hydrocodone-acetaminophen, lisinopril, metoprolol succinate, tamoxifen, tetrahydrozoline-zinc, venlafaxine xr, and trazodone.  SURGICAL HISTORY:  Past Surgical History  Procedure Laterality Date  . Tubal ligation    . Wisdom tooth extraction    . Mastectomy modified radical  10/10/10     left -Dr Jamey Ripa    FAMILY HISTORY: family history includes Heart disease in her father.  SOCIAL HISTORY:  reports that she has been smoking.  She has never used smokeless tobacco. She reports that she drinks about 12.6 ounces of alcohol per week. She reports that she does not use illicit drugs.  REVIEW OF SYSTEMS:  Other than that discussed above is noncontributory.  PHYSICAL EXAMINATION: ECOG PERFORMANCE STATUS: 1 - Symptomatic but completely ambulatory  Blood pressure 106/70, pulse 98, temperature 97.9 F (36.6 C), temperature source Oral, resp. rate 16, weight 186 lb 11.2 oz (84.687 kg), last menstrual period 06/13/2010.  GENERAL:alert, no distress and comfortable SKIN: skin color, texture, turgor are normal, no rashes or significant lesions EYES: PERLA; Conjunctiva are pink and non-injected, sclera clear OROPHARYNX:no exudate, no erythema on lips, buccal mucosa, or tongue. NECK: supple, thyroid normal size, non-tender, without nodularity. No masses CHEST: Status post left mastectomy with significant induration from previous radiation without subcutaneous nodules. Right breast without mass. LYMPH:  no palpable lymphadenopathy in the cervical, axillary or inguinal LUNGS: clear to auscultation and percussion with normal breathing effort HEART: regular rate & rhythm and no murmurs. ABDOMEN:abdomen soft, non-tender and normal bowel sounds MUSCULOSKELETAL:no cyanosis of digits and no clubbing. Range of motion normal.  NEURO: alert & oriented x 3 with fluent speech, no focal motor/sensory deficits   LABORATORY DATA: Office Visit on 01/29/2013  Component Date Value Range  Status  . WBC 01/29/2013 4.8  4.0 - 10.5 K/uL Final  . RBC 01/29/2013 3.57* 3.87 - 5.11 MIL/uL Final  . Hemoglobin 01/29/2013 12.1  12.0 - 15.0 g/dL Final  . HCT 45/40/9811 36.7  36.0 - 46.0 % Final  . MCV 01/29/2013 102.8* 78.0 - 100.0 fL Final  . MCH 01/29/2013 33.9  26.0 - 34.0 pg Final  . MCHC 01/29/2013 33.0  30.0 - 36.0 g/dL Final  . RDW 91/47/8295 13.5  11.5 - 15.5 % Final  . Platelets 01/29/2013 313  150 - 400 K/uL Final  . Neutrophils Relative % 01/29/2013 51  43 - 77 % Final  . Neutro Abs 01/29/2013 2.5  1.7 - 7.7 K/uL Final  . Lymphocytes Relative 01/29/2013 28  12 - 46 % Final  . Lymphs Abs 01/29/2013 1.4  0.7 - 4.0 K/uL Final  . Monocytes Relative 01/29/2013 13* 3 - 12 % Final  . Monocytes Absolute 01/29/2013 0.6  0.1 - 1.0 K/uL Final  . Eosinophils Relative 01/29/2013 8* 0 - 5 % Final  . Eosinophils Absolute 01/29/2013 0.4  0.0 - 0.7 K/uL Final  . Basophils Relative 01/29/2013 0  0 - 1 % Final  . Basophils Absolute 01/29/2013 0.0  0.0 - 0.1 K/uL Final    PATHOLOGY: None new  Urinalysis    Component Value Date/Time   COLORURINE STRAW* 04/28/2012 0313   APPEARANCEUR CLEAR 04/28/2012 0313   LABSPEC 1.010 04/28/2012 0313   PHURINE 5.5 04/28/2012 0313   GLUCOSEU NEGATIVE 04/28/2012 0313   HGBUR NEGATIVE 04/28/2012 0313   BILIRUBINUR NEGATIVE 04/28/2012 0313   KETONESUR NEGATIVE 04/28/2012 0313   PROTEINUR NEGATIVE 04/28/2012 0313   UROBILINOGEN 0.2 04/28/2012 0313   NITRITE NEGATIVE 04/28/2012 0313   LEUKOCYTESUR NEGATIVE 04/28/2012 0313    RADIOGRAPHIC STUDIES: US Breast Right  01/27/2013   CLINICAL DATA:  Right breast lump. Patient notes that this lump is  around 2 o'clock and closer to the nipple compared with the lump she palpated in November 2013.  EXAM: DIGITAL DIAGNOSTIC  RIGHT MAMMOGRAM WITH CAD  ULTRASOUND RIGHT BREAST  COMPARISON:  Multiple priors  ACR Breast Density Category b: There are scattered areas of fibroglandular density.  FINDINGS: No suspicious microcalcifications,  masses, or architectural distortion. Specifically, no abnormality is seen underlying the palpable marker in the inner right breast.  Mammographic images were processed with CAD.  On physical exam,no discrete mass is palpated in the area of concern at 2 o'clock, 1 cm from the nipple.  Targeted ultrasound in the area of palpable concern is negative.  IMPRESSION: Negative examination  RECOMMENDATION: Further management of the patient's palpable abnormality should be on a clinical basis.Right screening mammogram in 1 year is recommended.  I have discussed the findings and recommendations with the patient. Results were also provided in writing at the conclusion of the visit.  BI-RADS CATEGORY  1: Negative   Electronically Signed   By: Jerene Dilling M.D.   On: 01/27/2013 14:25   Mm Digital Diagnostic Unilat R  01/27/2013   CLINICAL DATA:  Right breast lump. Patient notes that this lump is around 2 o'clock and closer to the nipple compared with the lump she palpated in November 2013.  EXAM: DIGITAL DIAGNOSTIC  RIGHT MAMMOGRAM WITH CAD  ULTRASOUND RIGHT BREAST  COMPARISON:  Multiple priors  ACR Breast Density Category b: There are scattered areas of fibroglandular density.  FINDINGS: No suspicious microcalcifications, masses, or architectural distortion. Specifically, no abnormality is seen underlying the palpable marker in the inner right breast.  Mammographic images were processed with CAD.  On physical exam,no discrete mass is palpated in the area of concern at 2 o'clock, 1 cm from the nipple.  Targeted ultrasound in the area of palpable concern is negative.  IMPRESSION: Negative examination  RECOMMENDATION: Further management of the patient's palpable abnormality should be on a clinical basis.Right screening mammogram in 1 year is recommended.  I have discussed the findings and recommendations with the patient. Results were also provided in writing at the conclusion of the visit.  BI-RADS CATEGORY  1: Negative    Electronically Signed   By: Jerene Dilling M.D.   On: 01/27/2013 14:25    ASSESSMENT:  #1. Locally advanced left breast cancer, no evidence of disease, significant vasomotor instability with tamoxifen 10 mg twice a day. #2. Depression with previous suicidal ideation, better controlled #3. 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    PLAN:  #1. Lab tests to include FSH, LH, and estradiol to determine menopausal status. The patient had been treated in the past with tamoxifen 20 mg daily which was totally intolerable. She was then switched to an [facility] agonist plus an aromatase inhibitor with no improvement in symptoms. She was then placed on tamoxifen which she has been taking for about a year and a half and which is producing rather significant hot flashes. Plan now is to decrease tamoxifen to 10 mg daily and the patient was told to call back in 10 days regarding the degree of symptomatology. #2. She was advised to dry the area in the left axilla and left anterior chest wall carefully after washing and then to apply the nystatin powder along with or with cornstarch as a substitute. #3. Followup in 6 months with lab tests   All questions were answered. The patient knows to call the clinic with any problems, questions or concerns. We can certainly see  the patient much sooner if necessary.   I spent 25 minutes counseling the patient face to face. The total time spent in the appointment was 30 minutes.    Maurilio Lovely, MD 01/29/2013 3:09 PM

## 2013-01-29 NOTE — Patient Instructions (Signed)
[facility] Discharge Instructions  RECOMMENDATIONS MADE BY THE CONSULTANT AND ANY TEST RESULTS WILL BE SENT TO YOUR REFERRING PHYSICIAN.  EXAM FINDINGS BY THE PHYSICIAN TODAY AND SIGNS OR SYMPTOMS TO REPORT TO CLINIC OR PRIMARY PHYSICIAN: Exam and findings as discussed by Dr. Daneen Schick.   Report any new lumps, bone pain, shortness of breath or other symptoms.  Will check some blood work today.  Call us in 10 days and let us know how you are doing.  MEDICATIONS PRESCRIBED:  Decrease Tamoxifen to 10 mg daily.    INSTRUCTIONS/FOLLOW-UP: Follow-up in 6 months with labs and MD visit.  Thank you for choosing [facility] to provide your oncology and hematology care.  To afford each patient quality time with our providers, please arrive at least 15 minutes before your scheduled appointment time.  With your help, our goal is to use those 15 minutes to complete the necessary work-up to ensure our physicians have the information they need to help with your evaluation and healthcare recommendations.    Effective January 1st, 2014, we ask that you re-schedule your appointment with our physicians should you arrive 10 or more minutes late for your appointment.  We strive to give you quality time with our providers, and arriving late affects you and other patients whose appointments are after yours.    Again, thank you for choosing [facility].  Our hope is that these requests will decrease the amount of time that you wait before being seen by our physicians.       _____________________________________________________________  Should you have questions after your visit to [facility], please contact our office at [phone] between the hours of 8:30 a.m. and 5:00 p.m.  Voicemails left after 4:30 p.m. will not be returned until the following business day.  For prescription refill requests, have your pharmacy contact our office with your  prescription refill request.

## 2013-01-30 LAB — LUTEINIZING HORMONE: LH: 15.6 m[IU]/mL

## 2013-01-30 LAB — CEA: CEA: 4.3 ng/mL (ref 0.0–5.0)

## 2013-01-30 LAB — FOLLICLE STIMULATING HORMONE: FSH: 16.4 m[IU]/mL

## 2013-01-30 LAB — CANCER ANTIGEN 27.29: CA 27.29: 24 U/mL (ref 0–39)

## 2013-02-22 ENCOUNTER — Other Ambulatory Visit (HOSPITAL_COMMUNITY): Payer: Self-pay | Admitting: Oncology

## 2013-03-19 ENCOUNTER — Telehealth (HOSPITAL_COMMUNITY): Payer: Self-pay

## 2013-03-19 ENCOUNTER — Other Ambulatory Visit (HOSPITAL_COMMUNITY): Payer: Self-pay | Admitting: Oncology

## 2013-03-19 ENCOUNTER — Telehealth (HOSPITAL_COMMUNITY): Payer: Self-pay | Admitting: *Deleted

## 2013-03-19 DIAGNOSIS — C50319 Malignant neoplasm of lower-inner quadrant of unspecified female breast: Secondary | ICD-10-CM

## 2013-03-19 MED ORDER — HYDROCODONE-ACETAMINOPHEN 5-325 MG PO TABS: 1.0000 | ORAL_TABLET | Freq: Four times a day (QID) | ORAL | Status: DC | PRN

## 2013-03-19 NOTE — Telephone Encounter (Signed)
Message copied by Mellissa Kohut on Fri Mar 19, 2013 10:37 AM ------      Message from: [facility]      Created: Fri Mar 19, 2013  9:30 AM       Hydrocodone can no longer be called into a pharmacy and no refills are allowed on the prescription either.  Therefore, there is an Rx printed and in the drawer for pick-up.  Please let Zari know.             Per Mazzie: They called and said they need refill for Chrisha on her Vicodan and needs to be called into [facility] please. ------

## 2013-03-19 NOTE — Telephone Encounter (Signed)
They called and said they need refill for Brittany Blair on her Vicodan and needs to be called into [facility] please.

## 2013-03-19 NOTE — Telephone Encounter (Signed)
Message left for patient as documented below.  To call clinic with any questions.

## 2013-03-26 ENCOUNTER — Other Ambulatory Visit (HOSPITAL_COMMUNITY): Payer: Self-pay | Admitting: Oncology

## 2013-04-26 ENCOUNTER — Other Ambulatory Visit (HOSPITAL_COMMUNITY): Payer: Self-pay | Admitting: Oncology

## 2013-06-01 ENCOUNTER — Other Ambulatory Visit (HOSPITAL_COMMUNITY): Payer: Self-pay | Admitting: Oncology

## 2013-06-01 DIAGNOSIS — C50319 Malignant neoplasm of lower-inner quadrant of unspecified female breast: Secondary | ICD-10-CM

## 2013-06-01 DIAGNOSIS — F411 Generalized anxiety disorder: Secondary | ICD-10-CM

## 2013-06-01 MED ORDER — ALPRAZOLAM 0.5 MG PO TABS: 0.5000 mg | ORAL_TABLET | Freq: Three times a day (TID) | ORAL | Status: DC | PRN

## 2013-06-09 ENCOUNTER — Telehealth (HOSPITAL_COMMUNITY): Payer: Self-pay

## 2013-06-09 NOTE — Telephone Encounter (Signed)
Message copied by Mellissa Kohut on Wed Jun 09, 2013 11:29 AM ------      Message from: Farrel Gobble A      Created: Wed Jun 09, 2013 11:20 AM       I'm sure no one told her to increase Tam back to 20mg  daily.  The purpose of lowering the dose was to decrease hot flashes.  She was supposed to call back in Dec 2014 after 10 days on the lower dose to report on hot flash status.  There is no contraindication to staying on Tam 20mg  daily now if hot flashes are not back to the worst state but in view of clots and vag bleeding, she needs to see her GYN to do D&C and stop the drug completely until endometrial tissue verifies hyperplasia only.   If you want me to talk to her, I will. Thanks. Dr.F ------

## 2013-06-09 NOTE — Telephone Encounter (Signed)
In January tamoxifen was decreased to 10 mg daily due to hot flashes. Approximately 6 weeks later had episode of cramping and vaginal spotting and resumed taking Tamoxifen 20 mg daily.  On 4/10 developed abdominal cramping and has been having vaginal bleeding - similar to a period with some clots. Has not had any menses since chemotherapy until now.  Wants to know what to do?

## 2013-06-09 NOTE — Telephone Encounter (Signed)
Spoke with [name] and instructed that Brittany Blair is to stop the Tamoxifen until endometrial tissue verifies hyperplasia only and to see her GYN as soon as possible for possible D&C.  Verbalized understanding of instructions.

## 2013-06-14 ENCOUNTER — Telehealth (HOSPITAL_COMMUNITY): Payer: Self-pay

## 2013-06-14 ENCOUNTER — Other Ambulatory Visit (HOSPITAL_COMMUNITY): Payer: Self-pay | Admitting: Oncology

## 2013-06-14 DIAGNOSIS — C50319 Malignant neoplasm of lower-inner quadrant of unspecified female breast: Secondary | ICD-10-CM

## 2013-06-14 MED ORDER — HYDROCODONE-ACETAMINOPHEN 5-325 MG PO TABS: 1.0000 | ORAL_TABLET | Freq: Four times a day (QID) | ORAL | Status: DC | PRN

## 2013-06-14 NOTE — Telephone Encounter (Signed)
Refill requested for Hydrocodone 5/325.   Will pick up when ready.  Call back [phone] ext [phone].

## 2013-06-14 NOTE — Telephone Encounter (Signed)
Spoke with [facility] and he will pick up the prescription.

## 2013-06-18 ENCOUNTER — Telehealth (HOSPITAL_COMMUNITY): Payer: Self-pay

## 2013-06-18 NOTE — Telephone Encounter (Signed)
Spoke with husband Brittany Blair, and Brittany Blair has appointment next Wednesday with GYN.

## 2013-06-18 NOTE — Telephone Encounter (Signed)
Message copied by Mellissa Kohut on Fri Jun 18, 2013  4:42 PM ------      Message from: Farrel Gobble A      Created: Fri Jun 18, 2013  3:47 PM       Tell her to go the ER to get started on some Rx.  They will refer her to the GYN on call.  Thanks. ------

## 2013-06-18 NOTE — Telephone Encounter (Signed)
Message left for patient to call office to let us know which GYN she wants to see so we can get an appointment scheduled.

## 2013-06-18 NOTE — Telephone Encounter (Signed)
Message left on voicemail from patient requesting that we contact GYN to expedite appointment regarding vaginal bleeding.  States "Discharge is now black and has foul odor, feel like something dying inside."     Call made to patient to find out who she wanted to see.  Unable to speak to patient and left message on voicemail for patient to call office.

## 2013-06-23 ENCOUNTER — Other Ambulatory Visit: Payer: Self-pay | Admitting: Adult Health

## 2013-06-23 ENCOUNTER — Telehealth (HOSPITAL_COMMUNITY): Payer: Self-pay

## 2013-06-23 ENCOUNTER — Other Ambulatory Visit (HOSPITAL_COMMUNITY)
Admission: RE | Admit: 2013-06-23 | Discharge: 2013-06-23 | Disposition: A | Discharge status: Home / Self Care | Payer: Self-pay | Source: Ambulatory Visit | Attending: Adult Health | Admitting: Adult Health

## 2013-06-23 ENCOUNTER — Encounter: Payer: Self-pay | Admitting: Adult Health

## 2013-06-23 ENCOUNTER — Other Ambulatory Visit (INDEPENDENT_AMBULATORY_CARE_PROVIDER_SITE_OTHER): Payer: Self-pay

## 2013-06-23 ENCOUNTER — Ambulatory Visit (INDEPENDENT_AMBULATORY_CARE_PROVIDER_SITE_OTHER): Payer: Self-pay | Admitting: Adult Health

## 2013-06-23 VITALS — BP 124/98 | Ht 64.0 in | Wt 195.0 lb

## 2013-06-23 DIAGNOSIS — N950 Postmenopausal bleeding: Secondary | ICD-10-CM

## 2013-06-23 DIAGNOSIS — Z124 Encounter for screening for malignant neoplasm of cervix: Secondary | ICD-10-CM | POA: Insufficient documentation

## 2013-06-23 DIAGNOSIS — Z853 Personal history of malignant neoplasm of breast: Secondary | ICD-10-CM

## 2013-06-23 DIAGNOSIS — Z1151 Encounter for screening for human papillomavirus (HPV): Secondary | ICD-10-CM | POA: Insufficient documentation

## 2013-06-23 DIAGNOSIS — C50319 Malignant neoplasm of lower-inner quadrant of unspecified female breast: Secondary | ICD-10-CM

## 2013-06-23 HISTORY — DX: Postmenopausal bleeding: N95.0
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
## 2013-06-23 NOTE — Patient Instructions (Signed)
Return in 2 days for endo bx with Dr Elonda Husky Postmenopausal Bleeding Postmenopausal bleeding is any bleeding a woman has after she has entered into menopause. Menopause is the end of a woman's fertile years. After menopause, a woman no longer ovulates or has menstrual periods.  Postmenopausal bleeding can be caused by various things. Any type of postmenopausal bleeding, even if it appears to be a typical menstrual period, is concerning. This should be evaluated by your health care provider. Any treatment will depend on the cause of the bleeding. HOME CARE INSTRUCTIONS Monitor your condition for any changes. The following actions may help to alleviate any discomfort you are experiencing:  Avoid the use of tampons and douches as directed by your health care provider.  Change your pads frequently.  Get regular pelvic exams and Pap tests.  Keep all follow-up appointments for diagnostic tests as directed by your health care provider. SEEK MEDICAL CARE IF:   Your bleeding lasts more than 1 week.  You have abdominal pain.  You have bleeding with sexual intercourse. SEEK IMMEDIATE MEDICAL CARE IF:   You have a fever, chills, headache, dizziness, muscle aches, and bleeding.  You have severe pain with bleeding.  You are passing blood clots.  You have bleeding and need more than 1 pad an hour.  You feel faint. MAKE SURE YOU:  Understand these instructions.  Will watch your condition.  Will get help right away if you are not doing well or get worse. Document Released: 05/22/2005 Document Revised: 12/02/2012 Document Reviewed: 09/10/2012 [facility] Patient Information 2014 [place], [place].

## 2013-06-23 NOTE — Telephone Encounter (Signed)
Call from [place] - was seen by GYN today and ultrasound showed thickening of endometrial wall.  To have biopsy tomorrow by Dr. Elonda Husky.  May need additional studies/procedures and patient prefers to have hysterectomy if needed.  Discussed with Dr. Barnet Glasgow.

## 2013-06-24 ENCOUNTER — Ambulatory Visit (INDEPENDENT_AMBULATORY_CARE_PROVIDER_SITE_OTHER): Payer: Self-pay | Admitting: Obstetrics & Gynecology

## 2013-06-24 ENCOUNTER — Encounter: Payer: Self-pay | Admitting: Obstetrics & Gynecology

## 2013-06-24 DIAGNOSIS — N950 Postmenopausal bleeding: Secondary | ICD-10-CM

## 2013-06-24 NOTE — Addendum Note (Signed)
Addended by: Farley Ly on: 06/24/2013 04:51 PM   Modules accepted: Orders
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
## 2013-06-25 ENCOUNTER — Other Ambulatory Visit (HOSPITAL_COMMUNITY): Payer: Self-pay | Admitting: Oncology

## 2013-06-28 ENCOUNTER — Other Ambulatory Visit: Payer: Self-pay

## 2013-06-28 ENCOUNTER — Other Ambulatory Visit: Payer: Self-pay | Admitting: Obstetrics & Gynecology

## 2013-07-01 ENCOUNTER — Ambulatory Visit (INDEPENDENT_AMBULATORY_CARE_PROVIDER_SITE_OTHER): Payer: Self-pay | Admitting: Obstetrics & Gynecology

## 2013-07-01 ENCOUNTER — Encounter: Payer: Self-pay | Admitting: Obstetrics & Gynecology

## 2013-07-01 VITALS — BP 118/80 | Ht 64.0 in | Wt 196.5 lb

## 2013-07-01 DIAGNOSIS — N950 Postmenopausal bleeding: Secondary | ICD-10-CM

## 2013-07-01 NOTE — Progress Notes (Signed)
Patient ID: Brittany Blair, female   DOB: 11/11/1960, 53 y.o.   MRN: [id number] Biopsy endometrial weakly proliferative no atypia Will discuss with oncologist regarding management and treatment Will call with follow up

## 2013-07-06 ENCOUNTER — Telehealth (HOSPITAL_COMMUNITY): Payer: Self-pay

## 2013-07-06 NOTE — Telephone Encounter (Signed)
Call to patient to discuss options from GYN regarding D& C vs hysterectomy.  Discussed with Dr. Barnet Glasgow and patient notified that per Dr. Barnet Glasgow this was an appropriate approach to take.  To do the D & C first then discuss possibility of hysterectomy.  Per patient Dr. Elonda Husky was suppose to contact oncologist to discuss and she has not heard back from GYN.  Encouraged patient to call his office to further discuss options.

## 2013-07-06 NOTE — Telephone Encounter (Signed)
Message received from patient stating that Dr. Elonda Husky wants to do [facility] and not a hysterectomy and that he was suppose to discuss this with the oncologist and wanted to know if he had done so. Also, wanted to let us know that she had a family member that was in the [employer] that committed suicide recently so she was having some emotional issues at present.  Requested call back late afternoon to discuss.

## 2013-07-13 ENCOUNTER — Telehealth (HOSPITAL_COMMUNITY): Payer: Self-pay

## 2013-07-13 NOTE — Telephone Encounter (Signed)
Call from patient stating that she has not heard anything back from Dr. Elonda Husky regarding follow-up.  Stated "I haven't heard anything from him and not sure I want to.  Want to meet with someone from oncology to discuss what's going on because he is not an oncologist and I don't think he really understands my situation.  I want to get some answers, I'm about ready to quit everything.  I'm still having a hard time about with the death of my family member and I just need some answers."  Patient called at number given - received no answer but left message that she could be seen at 2:30pm 5/18, 10 am on 5/19, 10 am on 5/20 or at 11:30 on 5/21 by [name], PA-C who would be able to spend time discussing issues with her.  Requested call back to let me know when she wanted to come in.

## 2013-07-14 ENCOUNTER — Telehealth (HOSPITAL_COMMUNITY): Payer: Self-pay

## 2013-07-14 NOTE — Telephone Encounter (Signed)
Message left for patient that an appointment is still available on 5/21 @ 11:30am and that next available will be in June.  Requested call back to discuss.

## 2013-07-22 ENCOUNTER — Other Ambulatory Visit (HOSPITAL_COMMUNITY): Payer: Self-pay

## 2013-07-22 DIAGNOSIS — C50319 Malignant neoplasm of lower-inner quadrant of unspecified female breast: Secondary | ICD-10-CM

## 2013-07-30 ENCOUNTER — Encounter (HOSPITAL_COMMUNITY): Payer: Self-pay | Attending: Oncology | Admitting: Oncology

## 2013-07-30 ENCOUNTER — Encounter (HOSPITAL_COMMUNITY): Payer: Self-pay | Admitting: Oncology

## 2013-07-30 ENCOUNTER — Encounter (HOSPITAL_COMMUNITY): Payer: Self-pay | Attending: Oncology

## 2013-07-30 DIAGNOSIS — C50919 Malignant neoplasm of unspecified site of unspecified female breast: Secondary | ICD-10-CM | POA: Insufficient documentation

## 2013-07-30 DIAGNOSIS — N939 Abnormal uterine and vaginal bleeding, unspecified: Secondary | ICD-10-CM

## 2013-07-30 DIAGNOSIS — F3289 Other specified depressive episodes: Secondary | ICD-10-CM | POA: Insufficient documentation

## 2013-07-30 DIAGNOSIS — Z170 Estrogen receptor positive status [ER+]: Secondary | ICD-10-CM

## 2013-07-30 DIAGNOSIS — Z79899 Other long term (current) drug therapy: Secondary | ICD-10-CM | POA: Insufficient documentation

## 2013-07-30 DIAGNOSIS — C779 Secondary and unspecified malignant neoplasm of lymph node, unspecified: Secondary | ICD-10-CM | POA: Insufficient documentation

## 2013-07-30 DIAGNOSIS — F329 Major depressive disorder, single episode, unspecified: Secondary | ICD-10-CM | POA: Insufficient documentation

## 2013-07-30 DIAGNOSIS — N898 Other specified noninflammatory disorders of vagina: Secondary | ICD-10-CM | POA: Insufficient documentation

## 2013-07-30 DIAGNOSIS — C50319 Malignant neoplasm of lower-inner quadrant of unspecified female breast: Secondary | ICD-10-CM

## 2013-07-30 DIAGNOSIS — F341 Dysthymic disorder: Secondary | ICD-10-CM

## 2013-07-30 DIAGNOSIS — Z9221 Personal history of antineoplastic chemotherapy: Secondary | ICD-10-CM | POA: Insufficient documentation

## 2013-07-30 LAB — CBC WITH DIFFERENTIAL/PLATELET
Basophils Absolute: 0 10*3/uL (ref 0.0–0.1)
Basophils Relative: 1 % (ref 0–1)
Eosinophils Absolute: 0.4 10*3/uL (ref 0.0–0.7)
Eosinophils Relative: 9 % — ABNORMAL HIGH (ref 0–5)
HCT: 34.9 % — ABNORMAL LOW (ref 36.0–46.0)
HEMOGLOBIN: 11.9 g/dL — AB (ref 12.0–15.0)
LYMPHS ABS: 1.9 10*3/uL (ref 0.7–4.0)
LYMPHS PCT: 41 % (ref 12–46)
MCH: 35.3 pg — ABNORMAL HIGH (ref 26.0–34.0)
MCHC: 34.1 g/dL (ref 30.0–36.0)
MCV: 103.6 fL — ABNORMAL HIGH (ref 78.0–100.0)
Monocytes Absolute: 0.5 10*3/uL (ref 0.1–1.0)
Monocytes Relative: 11 % (ref 3–12)
NEUTROS PCT: 38 % — AB (ref 43–77)
Neutro Abs: 1.7 10*3/uL (ref 1.7–7.7)
PLATELETS: 268 10*3/uL (ref 150–400)
RBC: 3.37 MIL/uL — AB (ref 3.87–5.11)
RDW: 13 % (ref 11.5–15.5)
WBC: 4.5 10*3/uL (ref 4.0–10.5)

## 2013-07-30 LAB — COMPREHENSIVE METABOLIC PANEL
ALK PHOS: 102 U/L (ref 39–117)
ALT: 79 U/L — ABNORMAL HIGH (ref 0–35)
AST: 131 U/L — ABNORMAL HIGH (ref 0–37)
Albumin: 3.6 g/dL (ref 3.5–5.2)
BUN: 7 mg/dL (ref 6–23)
CO2: 20 meq/L (ref 19–32)
Calcium: 8.9 mg/dL (ref 8.4–10.5)
Chloride: 91 mEq/L — ABNORMAL LOW (ref 96–112)
Creatinine, Ser: 0.92 mg/dL (ref 0.50–1.10)
GFR calc Af Amer: 81 mL/min — ABNORMAL LOW (ref >90)
GFR, EST NON AFRICAN AMERICAN: 70 mL/min — AB (ref >90)
GLUCOSE: 97 mg/dL (ref 70–99)
POTASSIUM: 4.5 meq/L (ref 3.7–5.3)
Sodium: 130 mEq/L — ABNORMAL LOW (ref 137–147)
TOTAL PROTEIN: 7 g/dL (ref 6.0–8.3)
Total Bilirubin: 0.4 mg/dL (ref 0.3–1.2)

## 2013-07-30 NOTE — Patient Instructions (Signed)
[facility] Discharge Instructions  RECOMMENDATIONS MADE BY THE CONSULTANT AND ANY TEST RESULTS WILL BE SENT TO YOUR REFERRING PHYSICIAN.  Follow up in 3 months with our clinic. Please follow up with Dr.Stringer or Dr. Leo Grosser.   Thank you for choosing [facility] to provide your oncology and hematology care.  To afford each patient quality time with our providers, please arrive at least 15 minutes before your scheduled appointment time.  With your help, our goal is to use those 15 minutes to complete the necessary work-up to ensure our physicians have the information they need to help with your evaluation and healthcare recommendations.    Effective January 1st, 2014, we ask that you re-schedule your appointment with our physicians should you arrive 10 or more minutes late for your appointment.  We strive to give you quality time with our providers, and arriving late affects you and other patients whose appointments are after yours.    Again, thank you for choosing [facility].  Our hope is that these requests will decrease the amount of time that you wait before being seen by our physicians.       _____________________________________________________________  Should you have questions after your visit to [facility], please contact our office at [phone] between the hours of 8:30 a.m. and 5:00 p.m.  Voicemails left after 4:30 p.m. will not be returned until the following business day.  For prescription refill requests, have your pharmacy contact our office with your prescription refill request.

## 2013-07-30 NOTE — Progress Notes (Signed)
Brittany Blair presented for labwork. Labs per MD order drawn via Peripheral Line 23 gauge needle inserted in right hand  Good blood return present. Procedure without incident.  Needle removed intact. Patient tolerated procedure well.   

## 2013-07-30 NOTE — Progress Notes (Signed)
Brittany Blair is seen as a work-in today.  Her oncology history follows below:  Infiltrating ductal carcinoma the left breast, grade 3, stage IIIA (probably downstaged from IIIB following neoadjuvant chemotherapy described below).  She was given Taxotere x3 cycles preoperatively in a dose dense fashion followed by 2 cycles of dose dense FEC. Her chemotherapy was interrupted because of the severe MRSA infection prompting discontinuation. At the time of definitive surgery she still had a 4.5 cm cancer with LV I and 7 of 8 positive lymph nodes. ER receptors 61% positive, PR receptors 100% positive, HER-2/neu nonamplified, Ki-67 are high at 75%.  She was started on Arimidex but was intolerant to the medication with severe side effects.  Then on 05/22/2012, she was transitioned to Tamoxifen.  She has tolerated that well thus far until recently she started to have vaginal bleeding with clots.  She was referred to a local gynecologist who performed an endometrial biopsy on 06/28/2013 that was negative for malignancy following a PAP smear on 06/23/2013 (that was negative as well).    Her bleeding has continued and therefore, her Tamoxifen has been held since (for approximately one month to date).  She reports that she has had a poor experience with the local gynecologist with him "taking a phone call from a drug rep for 10 minutes while performing my internal exam and telling me that 'it was stupid for me not to take an epidural during the delivery of my 4 kids.' "  She explains, "I do not want to ever see him again."  Additionally, she declines seeing another gynecologist in the immediate area.  She reports that the most recent gynecologist refuses to perform a hysterectomy and wants to perform a D&C instead.  "I am uninsured and this is all out of pocket expenses for me."  "Wouldn't it make more sense to perform a hysterectomy and remove everything instead of undergoing repeated D&...whatever they're called."  From an  oncology standpoint, it is appropriate for her to undergo a hysterectomy, but I do not fully understand the Gynecologic protocols for that procedure and therefore, I did not comment upon her reactions above.  I understand her concern about the financial impact of possibly undergoing repeated D&Cs.    Because she is having vaginal bleeding, Tamoxifen is on hold.  She was intolerant to aromatase inhibitors.  Tamoxifen does increase the risk of female tract malignancies and therefore it would be reasonable for her to undergo a total hysterectomy given the fact that she has vaginal bleeding, is on a medication that increases the risk of female tract malignancies, she is post-menopausal, and she desires to have the organs removed in total.  I must defer the decision to Gynecology and in an attempt for the patient to have a better experience and possibly her desired outcome, I will refer her to a Gynecologist in [place] at the patient's request.  She mentions the name of Dr. Raphael Gibney and [facility] who participated in the delivery of her children so I will refer her to them.   Recently, her nephew committed suicide upon his return from [employer] duty.  This is weighing on her mind heavily.   PATHOLOGY:  06/23/2013  Adequacy Reason Satisfactory for evaluation, endocervical/transformation zone component PRESENT. Diagnosis NEGATIVE FOR INTRAEPITHELIAL LESIONS OR MALIGNANCY. BENIGN REACTIVE/REPARATIVE CHANGES. Enid Cutter MD Pathologist, Electronic Signature (Case signed 06/28/2013)  06/28/2013  Diagnosis Endometrium, biopsy - ABUNDANT BLOOD AND BENIGN WEAKLY PROLIFERATIVE ENDOMETRIAL GLANDS AND FRAGMENTS OF STROMA. Aldona Bar MD Pathologist, Electronic  Signature (Case signed 06/29/2013)     Assessment: 1. Vaginal bleeding 2. Infiltrating ductal carcinoma the left breast, grade 3, stage IIIA (probably downstaged from IIIB following neoadjuvant chemotherapy described below).  She was given  Taxotere x3 cycles preoperatively in a dose dense fashion followed by 2 cycles of dose dense FEC. Her chemotherapy was interrupted because of the severe MRSA infection prompting discontinuation. At the time of definitive surgery she still had a 4.5 cm cancer with LV I and 7 of 8 positive lymph nodes. ER receptors 61% positive, PR receptors 100% positive, HER-2/neu nonamplified, Ki-67 are high at 75%.  She was started on Arimidex but was intolerant to the medication with severe side effects.  Then on 05/22/2012, she was transitioned to Tamoxifen.  She has tolerated that well thus far until recently she started to have vaginal bleeding with clots.  She was referred to a local gynecologist who performed an endometrial biopsy on 06/28/2013 that was negative for malignancy following a PAP smear on 06/23/2013 (that was negative as well).   3. Depression, reactive   Plan: 1. Hold Tamoxifen for the time being, secondary to vaginal bleeding. 2. Refer to Gyn in [place].  Oncology recommends total hysterectomy for the reasons outlined above, but will defer the ultimate decision to the patient and her gynecologist.  3. Labs today as ordered: CBC diff, CMET 4. Return in 3 months for follow-up.  Patient and plan discussed with Dr. Farrel Gobble and he is in agreement with the aforementioned.   More than 50% of the time spent with the patient was utilized for counseling and coordination of care.   [facility] 07/30/2013

## 2013-07-30 NOTE — Progress Notes (Signed)
Amelita Hamza's reason for visit today are for labs as scheduled per MD orders.  Venipuncture performed with a 23 gauge butterfly needle to right hand.  Brittany Blair tolerated venipuncture well and without incident; questions were answered and patient was discharged. Blood drawn by [employer]

## 2013-07-31 LAB — CEA: CEA: 8 ng/mL — ABNORMAL HIGH (ref 0.0–5.0)

## 2013-07-31 LAB — CANCER ANTIGEN 27.29: CA 27.29: 30 U/mL (ref 0–39)

## 2013-08-02 ENCOUNTER — Telehealth (HOSPITAL_COMMUNITY): Payer: Self-pay | Admitting: Lab

## 2013-08-04 ENCOUNTER — Other Ambulatory Visit (HOSPITAL_COMMUNITY): Payer: Self-pay | Admitting: Oncology

## 2013-08-04 DIAGNOSIS — C50319 Malignant neoplasm of lower-inner quadrant of unspecified female breast: Secondary | ICD-10-CM

## 2013-08-04 MED ORDER — HYDROCODONE-ACETAMINOPHEN 5-325 MG PO TABS: 1.0000 | ORAL_TABLET | Freq: Four times a day (QID) | ORAL | Status: DC | PRN

## 2013-08-05 ENCOUNTER — Other Ambulatory Visit (HOSPITAL_COMMUNITY): Payer: Self-pay | Admitting: Oncology

## 2013-08-05 DIAGNOSIS — F411 Generalized anxiety disorder: Secondary | ICD-10-CM

## 2013-08-05 MED ORDER — ALPRAZOLAM 0.5 MG PO TABS: 0.5000 mg | ORAL_TABLET | Freq: Three times a day (TID) | ORAL | Status: DC | PRN

## 2013-08-09 ENCOUNTER — Telehealth (HOSPITAL_COMMUNITY): Payer: Self-pay

## 2013-08-09 NOTE — Telephone Encounter (Signed)
Message left from patient wanting to know if she should have her ovaries removed when she has the hysterectomy.   [id number] patient contacted and notified that per dictation from last visit on 07/30/13 - note per [name], PA-C that a total hysterectomy was recommended from an oncology viewpoint and that a copy of this note had been sent to her surgeon when the referral was made.

## 2013-08-24 ENCOUNTER — Other Ambulatory Visit (HOSPITAL_COMMUNITY): Payer: Self-pay | Admitting: Hematology and Oncology

## 2013-08-24 MED ORDER — TRAZODONE HCL 50 MG PO TABS: 50.0000 mg | ORAL_TABLET | Freq: Every day | ORAL | Status: DC

## 2013-09-06 ENCOUNTER — Telehealth (HOSPITAL_COMMUNITY): Payer: Self-pay

## 2013-09-06 ENCOUNTER — Telehealth (HOSPITAL_COMMUNITY): Payer: Self-pay | Admitting: Lab

## 2013-09-06 NOTE — Telephone Encounter (Signed)
Message received early AM from patient stating that she has been seen by Dr. Raphael Gibney and is waiting on call back regarding surgical date.  States " they said they would call me in 1 - 2 days after I was seen but I've not heard anything."  Wants to know if we will call their office or if she should call.  Dr. Doran Stabler office was contacted by our scheduler and they will be in touch with the patient regarding surgery.  Message left on Brittany Blair's home phone voice mail that Dr. Doran Stabler office will contact them.  Call back confirmatio requested.

## 2013-09-08 ENCOUNTER — Other Ambulatory Visit (HOSPITAL_COMMUNITY): Payer: Self-pay | Admitting: Oncology

## 2013-09-08 ENCOUNTER — Telehealth (HOSPITAL_COMMUNITY): Payer: Self-pay

## 2013-09-08 DIAGNOSIS — C50319 Malignant neoplasm of lower-inner quadrant of unspecified female breast: Secondary | ICD-10-CM

## 2013-09-08 MED ORDER — HYDROCODONE-ACETAMINOPHEN 5-325 MG PO TABS: 1.0000 | ORAL_TABLET | Freq: Four times a day (QID) | ORAL | Status: DC | PRN

## 2013-09-08 NOTE — Telephone Encounter (Signed)
Surgery scheduled for 8/20.  Requests refill for vicodin and effexor.  Plans to come by today to pick-up.

## 2013-09-10 ENCOUNTER — Other Ambulatory Visit: Payer: Self-pay | Admitting: Obstetrics and Gynecology

## 2013-09-20 ENCOUNTER — Other Ambulatory Visit (HOSPITAL_COMMUNITY): Payer: Self-pay | Admitting: Oncology

## 2013-10-07 ENCOUNTER — Encounter (HOSPITAL_COMMUNITY)
Admission: RE | Admit: 2013-10-07 | Discharge: 2013-10-07 | Disposition: A | Discharge status: Home / Self Care | Payer: Self-pay | Source: Ambulatory Visit | Attending: Obstetrics and Gynecology | Admitting: Obstetrics and Gynecology

## 2013-10-07 ENCOUNTER — Encounter (HOSPITAL_COMMUNITY): Payer: Self-pay

## 2013-10-07 DIAGNOSIS — Z01812 Encounter for preprocedural laboratory examination: Secondary | ICD-10-CM | POA: Insufficient documentation

## 2013-10-07 LAB — CBC
HEMATOCRIT: 34.5 % — AB (ref 36.0–46.0)
HEMOGLOBIN: 11.8 g/dL — AB (ref 12.0–15.0)
MCH: 35.4 pg — ABNORMAL HIGH (ref 26.0–34.0)
MCHC: 34.2 g/dL (ref 30.0–36.0)
MCV: 103.6 fL — ABNORMAL HIGH (ref 78.0–100.0)
Platelets: 261 10*3/uL (ref 150–400)
RBC: 3.33 MIL/uL — AB (ref 3.87–5.11)
RDW: 12.7 % (ref 11.5–15.5)
WBC: 7.5 10*3/uL (ref 4.0–10.5)

## 2013-10-07 LAB — BASIC METABOLIC PANEL
Anion gap: 17 — ABNORMAL HIGH (ref 5–15)
BUN: 10 mg/dL (ref 6–23)
CHLORIDE: 92 meq/L — AB (ref 96–112)
CO2: 24 mEq/L (ref 19–32)
Calcium: 9.2 mg/dL (ref 8.4–10.5)
Creatinine, Ser: 1.02 mg/dL (ref 0.50–1.10)
GFR calc non Af Amer: 62 mL/min — ABNORMAL LOW (ref >90)
GFR, EST AFRICAN AMERICAN: 71 mL/min — AB (ref >90)
GLUCOSE: 102 mg/dL — AB (ref 70–99)
POTASSIUM: 4.7 meq/L (ref 3.7–5.3)
Sodium: 133 mEq/L — ABNORMAL LOW (ref 137–147)

## 2013-10-07 NOTE — Patient Instructions (Addendum)
   Your procedure is scheduled on: Oct 14, 2013 AT 145PM  Enter through the Main Entrance of [facility] at:1215PM Pick up the phone at the desk and dial [phone] and inform us of your arrival.  Please call this number if you have any problems the morning of surgery: [phone]  Remember: Do not eat food after midnight: Do not drink clear liquids after: 9AM Take these medicines the morning of surgery with a SIP OF WATER: TAKE BLOOD PRESSURE MEDICATIONS DAY OF SURGERY  Do not wear jewelry, make-up, or FINGER nail polish No metal in your hair or on your body. Do not wear lotions, powders, perfumes.  You may wear deodorant.  Do not bring valuables to the hospital. Contacts, dentures or bridgework may not be worn into surgery.  Leave suitcase in the car. After Surgery it may be brought to your room. For patients being admitted to the hospital, checkout time is 11:00am the day of discharge.    Patients discharged on the day of surgery will not be allowed to drive home.     n

## 2013-10-07 NOTE — Pre-Procedure Instructions (Signed)
ekg given to DR. Foster and pt needs cardiac clearance before her surgery. Adrienne at [place]. String[facility] office informed and aware. Pt is to see Dr. Raphael Gibney today.

## 2013-10-08 ENCOUNTER — Ambulatory Visit (INDEPENDENT_AMBULATORY_CARE_PROVIDER_SITE_OTHER): Payer: Self-pay | Admitting: Cardiovascular Disease

## 2013-10-08 ENCOUNTER — Encounter: Payer: Self-pay | Admitting: Cardiovascular Disease

## 2013-10-08 ENCOUNTER — Encounter: Payer: Self-pay | Admitting: *Deleted

## 2013-10-08 VITALS — BP 101/70 | HR 96 | Ht 64.0 in | Wt 193.0 lb

## 2013-10-08 DIAGNOSIS — R0609 Other forms of dyspnea: Secondary | ICD-10-CM

## 2013-10-08 DIAGNOSIS — Z720 Tobacco use: Secondary | ICD-10-CM

## 2013-10-08 DIAGNOSIS — T451X5A Adverse effect of antineoplastic and immunosuppressive drugs, initial encounter: Secondary | ICD-10-CM

## 2013-10-08 DIAGNOSIS — IMO0001 Reserved for inherently not codable concepts without codable children: Secondary | ICD-10-CM

## 2013-10-08 DIAGNOSIS — Z8249 Family history of ischemic heart disease and other diseases of the circulatory system: Secondary | ICD-10-CM

## 2013-10-08 DIAGNOSIS — R9431 Abnormal electrocardiogram [ECG] [EKG]: Secondary | ICD-10-CM

## 2013-10-08 DIAGNOSIS — C50919 Malignant neoplasm of unspecified site of unspecified female breast: Secondary | ICD-10-CM

## 2013-10-08 DIAGNOSIS — R0989 Other specified symptoms and signs involving the circulatory and respiratory systems: Secondary | ICD-10-CM

## 2013-10-08 DIAGNOSIS — IMO0002 Reserved for concepts with insufficient information to code with codable children: Secondary | ICD-10-CM

## 2013-10-08 DIAGNOSIS — F17200 Nicotine dependence, unspecified, uncomplicated: Secondary | ICD-10-CM

## 2013-10-08 DIAGNOSIS — C50912 Malignant neoplasm of unspecified site of left female breast: Secondary | ICD-10-CM

## 2013-10-08 NOTE — Progress Notes (Signed)
Spoke to adrienne and she informed that pt is having echo and stress test done next tues.. Still waiting or cardiac clearance

## 2013-10-08 NOTE — Patient Instructions (Signed)
Your physician recommends that you schedule a follow-up appointment pending your test results. Your physician recommends that you continue on your current medications as directed. Please refer to the Current Medication list given to you today. Your physician has requested that you have an echocardiogram. Echocardiography is a painless test that uses sound waves to create images of your heart. It provides your doctor with information about the size and shape of your heart and how well your heart's chambers and valves are working. This procedure takes approximately one hour. There are no restrictions for this procedure. Your physician has requested that you have a lexiscan myoview. For further information please visit [email or web address]. Please follow instruction sheet, as given.

## 2013-10-08 NOTE — Progress Notes (Addendum)
Patient ID: Brittany Blair, female   DOB: 08-Dec-1960, 53 y.o.   MRN: [id number]       CARDIOLOGY CONSULT NOTE  Patient ID: Brittany Blair MRN: [id number] DOB/AGE: [birth year] 53 y.o.  Admit date: (Not on file) Primary Physician No primary provider on file.  Reason for Consultation: abnormal ECG  HPI: The patient is a 53 year old woman with a history of infiltrating ductal carcinoma of the left breast and vaginal bleeding who is scheduled to undergo a hysterectomy. She also has a history of essential hypertension. An ECG performed on August 13 demonstrated normal sinus rhythm with a nonspecific ST segment and T wave abnormality. She has a history of tobacco abuse as well.  She denies exertional chest pain. She has a history of a generalized anxiety disorder and palpitations associated with that in the past. She intermittently gets short of breath but has not noticed any progressive dyspnea with exertion. She has no current problems with leg swelling, orthopnea nor paroxysmal nocturnal dyspnea. She denies dizziness and syncope. She has lymphedema of the left arm related to her breast cancer surgery. She was treated for her breast cancer with radiation, chemotherapy and surgery. She has smoked anywhere from 1/3-1/2 pack of cigarettes daily off-and-on over the past 30 years denies ever having been a heavy smoker. She has gained 50 pounds and says she is "barely ambulatory".  An echocardiogram was performed on 08/09/2010 which demonstrated normal left ventricular systolic function and regional wall motion, EF 55-60% with grade 1 diastolic dysfunction. An ECG performed on 10/02/2010 demonstrated normal sinus rhythm, as did an ECG on 04/28/2012.  Soc: Smokes 1/3 to 1/2 ppd off/on x 30 yrs. Married.  Fam: Father died of MI at 59, alcoholic.  Allergies  Allergen Reactions  . Bee Venom Anaphylaxis    Current Outpatient Prescriptions  Medication Sig Dispense Refill  . acetaminophen (TYLENOL) 500  MG tablet Take 1,000 mg by mouth every 6 (six) hours as needed for mild pain or moderate pain.      Marland Kitchen acyclovir (ZOVIRAX) 400 MG tablet TAKE 1 TABLET BY MOUTH TWO TIMES DAILY  60 tablet  3  . ALPRAZolam (XANAX) 0.5 MG tablet Take 1 tablet (0.5 mg total) by mouth 3 (three) times daily as needed for anxiety.  90 tablet  3  . amLODipine (NORVASC) 5 MG tablet tablet daily      . clotrimazole (LOTRIMIN) 1 % cream Apply topically 2 (two) times daily.  45 g  0  . diphenhydrAMINE (BENADRYL) 25 MG tablet Take 25 mg by mouth every 6 (six) hours as needed for allergies.      Marland Kitchen HYDROcodone-acetaminophen (NORCO/VICODIN) 5-325 MG per tablet Take 1 tablet by mouth every 6 (six) hours as needed for moderate pain.  100 tablet  0  . lisinopril (PRINIVIL,ZESTRIL) 40 MG tablet TAKE 1 TABLET BY MOUTH ONCE DAILY  30 tablet  3  . loratadine (CLARITIN) 10 MG tablet Take 10 mg by mouth daily.      . metoprolol succinate (TOPROL-XL) 25 MG 24 hr tablet TAKE 3 TABLETS BY MOUTH ONCE DAILY WITH OR IMMEDIATELY FOLLOWING A MEAL  90 tablet  2  . nicotine (NICODERM CQ - DOSED IN MG/24 HOURS) 14 mg/24hr patch Place 14 mg onto the skin daily.      . tamoxifen (NOLVADEX) 10 MG tablet TAKE 1 TABLET BY MOUTH TWICE DAILY  60 tablet  6  . tetrahydrozoline-zinc (VISINE-AC) 0.05-0.25 % ophthalmic solution Place 2 drops into both eyes 3 (three) times daily  as needed (Red Eyes).      . traZODone (DESYREL) 50 MG tablet Take 1 tablet (50 mg total) by mouth at bedtime.  30 tablet  6  . venlafaxine XR (EFFEXOR-XR) 75 MG 24 hr capsule TAKE 3 CAPSULES BY MOUTH DAILY  90 capsule  2  . [DISCONTINUED] FLUoxetine (PROZAC) 20 MG tablet Take 40 mg by mouth daily.         No current facility-administered medications for this visit.    Past Medical History  Diagnosis Date  . Anxiety   . Herpes   . MRSA (methicillin resistant Staphylococcus aureus)   . Breast lump     left  . Hypertension   . Hematoma   . Swelling     left foot  . Multiple  blisters     along surgical site  . Breast cancer   . Depression   . Genital herpes 08/04/2012  . PMB (postmenopausal bleeding) 06/23/2013  . Lymph edema     Past Surgical History  Procedure Laterality Date  . Tubal ligation    . Wisdom tooth extraction    . Mastectomy modified radical  10/10/10     left -Dr Margot Chimes    History   Social History  . Marital Status: Married    Spouse Name: N/A    Number of Children: N/A  . Years of Education: N/A   Occupational History  . Not on file.   Social History Main Topics  . Smoking status: Former Smoker -- 0.35 packs/day    Types: Cigarettes    Quit date: 09/26/2013  . Smokeless tobacco: Never Used  . Alcohol Use: 12.6 oz/week    21 Glasses of wine per week  . Drug Use: No  . Sexual Activity: Not Currently    Birth Control/ Protection: None   Other Topics Concern  . Not on file   Social History Narrative  . No narrative on file     No family history of premature CAD in 1st degree relatives.  Prior to Admission medications   Medication Sig Start Date End Date Taking? Authorizing Provider  acetaminophen (TYLENOL) 500 MG tablet Take 1,000 mg by mouth every 6 (six) hours as needed for mild pain or moderate pain.   Yes Historical Provider, MD  acyclovir (ZOVIRAX) 400 MG tablet TAKE 1 TABLET BY MOUTH TWO TIMES DAILY 03/26/13  Yes Baird Cancer, PA-C  ALPRAZolam Duanne Moron) 0.5 MG tablet Take 1 tablet (0.5 mg total) by mouth 3 (three) times daily as needed for anxiety. 08/05/13  Yes Baird Cancer, PA-C  amLODipine (NORVASC) 5 MG tablet tablet daily 01/25/13  Yes Manon Hilding Kefalas, PA-C  clotrimazole (LOTRIMIN) 1 % cream Apply topically 2 (two) times daily. 10/30/12  Yes Verlan Friends, MD  diphenhydrAMINE (BENADRYL) 25 MG tablet Take 25 mg by mouth every 6 (six) hours as needed for allergies.   Yes Historical Provider, MD  HYDROcodone-acetaminophen (NORCO/VICODIN) 5-325 MG per tablet Take 1 tablet by mouth every 6 (six) hours as  needed for moderate pain. 09/08/13  Yes Baird Cancer, PA-C  lisinopril (PRINIVIL,ZESTRIL) 40 MG tablet TAKE 1 TABLET BY MOUTH ONCE DAILY 06/25/13  Yes Baird Cancer, PA-C  loratadine (CLARITIN) 10 MG tablet Take 10 mg by mouth daily.   Yes Historical Provider, MD  metoprolol succinate (TOPROL-XL) 25 MG 24 hr tablet TAKE 3 TABLETS BY MOUTH ONCE DAILY WITH OR IMMEDIATELY FOLLOWING A MEAL 09/20/13  Yes Baird Cancer, PA-C  nicotine (NICODERM CQ -  DOSED IN MG/24 HOURS) 14 mg/24hr patch Place 14 mg onto the skin daily.   Yes Historical Provider, MD  tamoxifen (NOLVADEX) 10 MG tablet TAKE 1 TABLET BY MOUTH TWICE DAILY 12/21/12  Yes Baird Cancer, PA-C  tetrahydrozoline-zinc (VISINE-AC) 0.05-0.25 % ophthalmic solution Place 2 drops into both eyes 3 (three) times daily as needed (Red Eyes).   Yes Historical Provider, MD  traZODone (DESYREL) 50 MG tablet Take 1 tablet (50 mg total) by mouth at bedtime. 08/24/13  Yes Farrel Gobble, MD  venlafaxine XR (EFFEXOR-XR) 75 MG 24 hr capsule TAKE 3 CAPSULES BY MOUTH DAILY 09/08/13  Yes Baird Cancer, PA-C     Review of systems complete and found to be negative unless listed above in HPI     Physical exam Blood pressure 101/70, pulse 96, height 5\' 4"  (1.626 m), weight 193 lb (87.544 kg), last menstrual period 05/29/2013, SpO2 97.00%. General: NAD Neck: No JVD, no thyromegaly or thyroid nodule.  Lungs: Clear to auscultation bilaterally with normal respiratory effort. CV: Nondisplaced PMI. Regular rate and rhythm, normal S1/S2, no S3/S4, no murmur.  No pretibial edema. No carotid bruit.  Normal pedal pulses.  Abdomen: Soft, nontender, no hepatosplenomegaly, no distention.  Skin: Intact without lesions or rashes.  Neurologic: Alert and oriented x 3.  Psych: Normal affect. Extremities: No clubbing or cyanosis. Left arm lymphedema. HEENT: Normal.   Labs:   Lab Results  Component Value Date   WBC 7.5 10/07/2013   HGB 11.8* 10/07/2013   HCT 34.5*  10/07/2013   MCV 103.6* 10/07/2013   PLT 261 10/07/2013    Recent Labs Lab 10/07/13 0926  NA 133*  K 4.7  CL 92*  CO2 24  BUN 10  CREATININE 1.02  CALCIUM 9.2  GLUCOSE 102*   No results found for this basename: CKTOTAL, CKMB, CKMBINDEX, TROPONINI    No results found for this basename: CHOL   No results found for this basename: HDL   No results found for this basename: LDLCALC   No results found for this basename: TRIG   No results found for this basename: CHOLHDL   No results found for this basename: LDLDIRECT         Studies: No results found.  ASSESSMENT AND PLAN:  1. Abnormal ECG: Her cardiovascular risk factors include a family history of heart disease, intermittent tobacco use and essential hypertension. 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 While she denies chest pain, she has had dyspnea and fatigue and it is difficult to assess her functional capacity given her limited ambulatory status. I will obtain a Lexiscan Cardiolite stress test to evaluate for ischemia as her nonspecific ST segment and T-wave abnormalities are new when compared to ECGs from March 2014 and August 2012. I will also obtain an echocardiogram to assess for any cardiomyopathic features.  2. Essential HTN: Controlled on current therapy which includes amlodipine, metoprolol, and lisinopril.   Signed: Kate Sable, M.D., F.A.C.C.  10/08/2013, 9:41 AM  ADDENDUM: Low risk nuclear MPI study and vigorous LV systolic function. Can safely proceed with surgery as planned.

## 2013-10-12 ENCOUNTER — Telehealth: Payer: Self-pay | Admitting: *Deleted

## 2013-10-12 ENCOUNTER — Ambulatory Visit (HOSPITAL_COMMUNITY)
Admission: RE | Admit: 2013-10-12 | Discharge: 2013-10-12 | Disposition: A | Discharge status: Home / Self Care | Payer: Self-pay | Source: Ambulatory Visit | Attending: Cardiovascular Disease | Admitting: Cardiovascular Disease

## 2013-10-12 ENCOUNTER — Encounter (HOSPITAL_COMMUNITY): Payer: Self-pay

## 2013-10-12 ENCOUNTER — Encounter (HOSPITAL_COMMUNITY)
Admission: RE | Admit: 2013-10-12 | Discharge: 2013-10-12 | Disposition: A | Discharge status: Home / Self Care | Payer: Self-pay | Source: Ambulatory Visit | Attending: Cardiovascular Disease | Admitting: Cardiovascular Disease

## 2013-10-12 DIAGNOSIS — R0609 Other forms of dyspnea: Secondary | ICD-10-CM

## 2013-10-12 DIAGNOSIS — Z87891 Personal history of nicotine dependence: Secondary | ICD-10-CM | POA: Insufficient documentation

## 2013-10-12 DIAGNOSIS — I079 Rheumatic tricuspid valve disease, unspecified: Secondary | ICD-10-CM | POA: Insufficient documentation

## 2013-10-12 DIAGNOSIS — Z923 Personal history of irradiation: Secondary | ICD-10-CM | POA: Insufficient documentation

## 2013-10-12 DIAGNOSIS — I10 Essential (primary) hypertension: Secondary | ICD-10-CM | POA: Insufficient documentation

## 2013-10-12 DIAGNOSIS — Z853 Personal history of malignant neoplasm of breast: Secondary | ICD-10-CM | POA: Insufficient documentation

## 2013-10-12 DIAGNOSIS — I359 Nonrheumatic aortic valve disorder, unspecified: Secondary | ICD-10-CM | POA: Insufficient documentation

## 2013-10-12 DIAGNOSIS — I369 Nonrheumatic tricuspid valve disorder, unspecified: Secondary | ICD-10-CM

## 2013-10-12 DIAGNOSIS — R0989 Other specified symptoms and signs involving the circulatory and respiratory systems: Secondary | ICD-10-CM

## 2013-10-12 DIAGNOSIS — R9431 Abnormal electrocardiogram [ECG] [EKG]: Secondary | ICD-10-CM | POA: Insufficient documentation

## 2013-10-12 MED ORDER — REGADENOSON 0.4 MG/5ML IV SOLN
INTRAVENOUS | Status: AC
  Administered 2013-10-12: 0.4 mg via INTRAVENOUS
  Filled 2013-10-12: qty 5

## 2013-10-12 MED ORDER — SODIUM CHLORIDE 0.9 % IJ SOLN
INTRAMUSCULAR | Status: AC
  Administered 2013-10-12: 10 mL via INTRAVENOUS
  Filled 2013-10-12: qty 10

## 2013-10-12 MED ORDER — SODIUM CHLORIDE 0.9 % IJ SOLN
10.0000 mL | INTRAMUSCULAR | Status: DC | PRN
  Administered 2013-10-12: 10 mL via INTRAVENOUS

## 2013-10-12 MED ORDER — TECHNETIUM TC 99M SESTAMIBI - CARDIOLITE
30.0000 | Freq: Once | INTRAVENOUS | Status: AC | PRN
  Administered 2013-10-12: 09:00:00 30 via INTRAVENOUS

## 2013-10-12 MED ORDER — REGADENOSON 0.4 MG/5ML IV SOLN
0.4000 mg | Freq: Once | INTRAVENOUS | Status: AC
  Administered 2013-10-12: 0.4 mg via INTRAVENOUS

## 2013-10-12 MED ORDER — TECHNETIUM TC 99M SESTAMIBI GENERIC - CARDIOLITE
10.0000 | Freq: Once | INTRAVENOUS | Status: AC | PRN
  Administered 2013-10-12: 10 via INTRAVENOUS

## 2013-10-12 NOTE — Telephone Encounter (Signed)
ECHO -   Notes Recorded by Herminio Commons, MD on 10/12/2013 at 4:07 PM Good results. My office note has been addended.  STRESS TEST -   Notes Recorded by Herminio Commons, MD on 10/12/2013 at 4:05 PM Low risk study. Can safely proceed with surgery.

## 2013-10-12 NOTE — Telephone Encounter (Signed)
Husband [facility]) notified.

## 2013-10-12 NOTE — Progress Notes (Signed)
  Echocardiogram 2D Echocardiogram has been performed.  [place], [place]en 10/12/2013, 12:22 PM

## 2013-10-12 NOTE — Progress Notes (Signed)
Stress Lab Nurses Notes - Ezinne Yogi 10/12/2013 Reason for doing test: abnormal EKG, pre- op Type of test: Wille Glaser Nurse performing test: Gerrit Halls, RN Boneta Lucks RN  Nuclear Medicine Tech: Redmond Baseman Echo Tech: Not Applicable MD performing test: Myles Gip Family MD: Freeman Caldron PA Test explained and consent signed: Yes.   IV started: Saline lock started in radiology Symptoms: Headache, anxiety, chest pain, shaking, felt faint Treatment/Intervention: None Reason test stopped: protocol completed After recovery IV was: Discontinued via X-ray tech Patient to return to [place]. Med at : Patient discharged: Home Patient's Condition upon discharge was: stable Comments:  During test BP159/98 HR 117, symptoms resolved, pt eating, Recovery Vitals 161/96 HR 87 Cheyna Retana, Wells Guiles

## 2013-10-12 NOTE — Telephone Encounter (Signed)
No further follow up needed per Dr. Bronson Ing.

## 2013-10-13 NOTE — H&P (Signed)
Admission History and Physical Exam for a Gynecology Patient  [name] is a 53 y.o. female, G4P4, who presents for laparoscopy assisted vaginal hysterectomy, and bilateral salpingo-oophorectomy. She has been followed at the [facility] and Gynecology division of [facility]. The patient was diagnosed with breast cancer in 2012. She had a mastectomy followed by chemotherapy and radiation therapy. She was placed on tamoxifen. She experienced postmenopausal bleeding. An endometrial biopsy showed benign elements. Her Pap smear was normal in 2015. Her tumor is estrogen receptor positive. The patient wants to proceed with hysterectomy and oophorectomy.  OB History   Grav Para Term Preterm Abortions TAB SAB Ect Mult Living   4 4        4       Past Medical History  Diagnosis Date  . Anxiety   . Herpes   . MRSA (methicillin resistant Staphylococcus aureus)   . Breast lump     left  . Hypertension   . Hematoma   . Swelling     left foot  . Multiple blisters     along surgical site  . Breast cancer   . Depression   . Genital herpes 08/04/2012  . PMB (postmenopausal bleeding) 06/23/2013  . Lymph edema     No prescriptions prior to admission    Past Surgical History  Procedure Laterality Date  . Tubal ligation    . Wisdom tooth extraction    . Mastectomy modified radical  10/10/10     left -Dr Margot Chimes    Allergies  Allergen Reactions  . Bee Venom Anaphylaxis    Family History: family history includes Cancer in her maternal grandmother and paternal grandmother; Heart disease in her father; Hypertension in her daughter, maternal grandfather, paternal grandfather, and son.  Social History:  reports that she quit smoking about 2 weeks ago. Her smoking use included Cigarettes. She smoked 0.35 packs per day. She has never used smokeless tobacco. She reports that she drinks about 12.6 ounces of alcohol per week. She reports that she does not use  illicit drugs.  Review of systems: See HPI.  Admission Physical Exam:    There is no weight on file to calculate BMI.  Last menstrual period 05/29/2013.  HEENT:                 Within normal limits Chest:                   Clear Heart:                    Regular rate and rhythm Breasts:                No masses, skin changes, bleeding, or discharge present Abdomen:             Nontender, no masses Extremities:          Grossly normal Neurologic exam: Grossly normal  Pelvic exam:  External genitalia: abnormal pigmentation Vaginal: normal without tenderness, induration or masses and Atrophic Cervix: normal appearance Adnexa: normal bimanual exam Uterus: Normal size shape and consistency  Assessment:  Postmenopausal bleeding Desires to resume tamoxifen therapy History of estrogen receptor positive breast cancer Anxiety Anemia Hypertension MRSA Overweight Lymph edema Cigarette smoker  Plan:  The patient will undergo a laparoscopy assisted vaginal hysterectomy and laparoscopic bilateral salpingo-oophorectomy. She understands the indications for her surgical procedure. She understands the alternative treatment options. She accepts the risks of, but not limited  to, anesthetic complications, bleeding, infections, and possible damage to the surrounding organs.   Eli Hose 10/13/2013

## 2013-10-14 ENCOUNTER — Encounter (HOSPITAL_COMMUNITY)
Admission: RE | Disposition: A | Discharge status: Home / Self Care | Payer: Self-pay | Source: Ambulatory Visit | Attending: Obstetrics and Gynecology

## 2013-10-14 ENCOUNTER — Encounter (HOSPITAL_COMMUNITY): Payer: Self-pay | Admitting: Anesthesiology

## 2013-10-14 ENCOUNTER — Ambulatory Visit (HOSPITAL_COMMUNITY)
Admission: RE | Admit: 2013-10-14 | Discharge: 2013-10-15 | Disposition: A | Discharge status: Home / Self Care | Payer: Self-pay | Source: Ambulatory Visit | Attending: Obstetrics and Gynecology | Admitting: Obstetrics and Gynecology

## 2013-10-14 ENCOUNTER — Other Ambulatory Visit (HOSPITAL_COMMUNITY): Payer: Self-pay | Admitting: Oncology

## 2013-10-14 ENCOUNTER — Ambulatory Visit (HOSPITAL_COMMUNITY): Payer: Self-pay | Admitting: Anesthesiology

## 2013-10-14 ENCOUNTER — Encounter (HOSPITAL_COMMUNITY): Payer: Self-pay | Admitting: *Deleted

## 2013-10-14 DIAGNOSIS — D649 Anemia, unspecified: Secondary | ICD-10-CM | POA: Insufficient documentation

## 2013-10-14 DIAGNOSIS — I10 Essential (primary) hypertension: Secondary | ICD-10-CM | POA: Insufficient documentation

## 2013-10-14 DIAGNOSIS — C50919 Malignant neoplasm of unspecified site of unspecified female breast: Secondary | ICD-10-CM | POA: Insufficient documentation

## 2013-10-14 DIAGNOSIS — Z87891 Personal history of nicotine dependence: Secondary | ICD-10-CM | POA: Insufficient documentation

## 2013-10-14 DIAGNOSIS — Z8614 Personal history of Methicillin resistant Staphylococcus aureus infection: Secondary | ICD-10-CM | POA: Insufficient documentation

## 2013-10-14 DIAGNOSIS — N950 Postmenopausal bleeding: Secondary | ICD-10-CM | POA: Insufficient documentation

## 2013-10-14 DIAGNOSIS — C50319 Malignant neoplasm of lower-inner quadrant of unspecified female breast: Secondary | ICD-10-CM

## 2013-10-14 DIAGNOSIS — F329 Major depressive disorder, single episode, unspecified: Secondary | ICD-10-CM | POA: Insufficient documentation

## 2013-10-14 DIAGNOSIS — F411 Generalized anxiety disorder: Secondary | ICD-10-CM | POA: Insufficient documentation

## 2013-10-14 DIAGNOSIS — I890 Lymphedema, not elsewhere classified: Secondary | ICD-10-CM | POA: Insufficient documentation

## 2013-10-14 DIAGNOSIS — F3289 Other specified depressive episodes: Secondary | ICD-10-CM | POA: Insufficient documentation

## 2013-10-14 DIAGNOSIS — Z6833 Body mass index (BMI) 33.0-33.9, adult: Secondary | ICD-10-CM | POA: Insufficient documentation

## 2013-10-14 DIAGNOSIS — E669 Obesity, unspecified: Secondary | ICD-10-CM | POA: Insufficient documentation

## 2013-10-14 DIAGNOSIS — Z170 Estrogen receptor positive status [ER+]: Secondary | ICD-10-CM | POA: Insufficient documentation

## 2013-10-14 HISTORY — PX: CYSTOSCOPY: SHX5120

## 2013-10-14 HISTORY — PX: LAPAROSCOPIC LYSIS OF ADHESIONS: SHX5905

## 2013-10-14 HISTORY — PX: LAPAROSCOPIC BILATERAL SALPINGO OOPHERECTOMY: SHX5890

## 2013-10-14 HISTORY — PX: LAPAROSCOPIC ASSISTED VAGINAL HYSTERECTOMY: SHX5398

## 2013-10-14 LAB — CREATININE, SERUM: Creatinine, Ser: 0.79 mg/dL (ref 0.50–1.10)

## 2013-10-14 SURGERY — HYSTERECTOMY, VAGINAL, LAPAROSCOPY-ASSISTED
Anesthesia: General | Site: Bladder

## 2013-10-14 MED ORDER — METOPROLOL SUCCINATE ER 25 MG PO TB24
25.0000 mg | ORAL_TABLET | Freq: Every day | ORAL | Status: DC
  Administered 2013-10-15: 25 mg via ORAL
  Filled 2013-10-14 (×3): qty 1

## 2013-10-14 MED ORDER — MIDAZOLAM HCL 2 MG/2ML IJ SOLN: INTRAMUSCULAR | Status: DC | PRN

## 2013-10-14 MED ORDER — LIDOCAINE HCL (CARDIAC) 20 MG/ML IV SOLN
INTRAVENOUS | Status: AC
  Filled 2013-10-14: qty 5

## 2013-10-14 MED ORDER — DOCUSATE SODIUM 100 MG PO CAPS
100.0000 mg | ORAL_CAPSULE | Freq: Two times a day (BID) | ORAL | Status: DC
  Administered 2013-10-15: 100 mg via ORAL
  Filled 2013-10-14 (×2): qty 1

## 2013-10-14 MED ORDER — DIPHENHYDRAMINE HCL 50 MG/ML IJ SOLN: 12.5000 mg | Freq: Four times a day (QID) | INTRAMUSCULAR | Status: DC | PRN

## 2013-10-14 MED ORDER — DIPHENHYDRAMINE HCL 12.5 MG/5ML PO ELIX: 12.5000 mg | ORAL_SOLUTION | Freq: Four times a day (QID) | ORAL | Status: DC | PRN

## 2013-10-14 MED ORDER — MEPERIDINE HCL 25 MG/ML IJ SOLN: 6.2500 mg | INTRAMUSCULAR | Status: DC | PRN

## 2013-10-14 MED ORDER — PNEUMOCOCCAL VAC POLYVALENT 25 MCG/0.5ML IJ INJ
0.5000 mL | INJECTION | INTRAMUSCULAR | Status: AC
Start: 2013-10-15 — End: 2013-10-15
  Administered 2013-10-15: 0.5 mL via INTRAMUSCULAR
  Filled 2013-10-14: qty 0.5

## 2013-10-14 MED ORDER — GLYCOPYRROLATE 0.2 MG/ML IJ SOLN
INTRAMUSCULAR | Status: DC | PRN
  Administered 2013-10-14: 0.2 mg via INTRAVENOUS
  Administered 2013-10-14 (×2): 0.4 mg via INTRAVENOUS

## 2013-10-14 MED ORDER — HYDROMORPHONE HCL PF 1 MG/ML IJ SOLN
INTRAMUSCULAR | Status: DC | PRN
  Administered 2013-10-14: 1 mg via INTRAVENOUS
  Administered 2013-10-14: 0.5 mg via INTRAVENOUS

## 2013-10-14 MED ORDER — AMLODIPINE BESYLATE 5 MG PO TABS
5.0000 mg | ORAL_TABLET | Freq: Every day | ORAL | Status: DC
  Administered 2013-10-15: 5 mg via ORAL
  Filled 2013-10-14 (×3): qty 1

## 2013-10-14 MED ORDER — HYDROMORPHONE HCL PF 1 MG/ML IJ SOLN
INTRAMUSCULAR | Status: AC
  Filled 2013-10-14: qty 1

## 2013-10-14 MED ORDER — MIDAZOLAM HCL 2 MG/2ML IJ SOLN
INTRAMUSCULAR | Status: AC
  Filled 2013-10-14: qty 2

## 2013-10-14 MED ORDER — PROPOFOL 10 MG/ML IV BOLUS
INTRAVENOUS | Status: DC | PRN
  Administered 2013-10-14: 200 mg via INTRAVENOUS

## 2013-10-14 MED ORDER — NEOSTIGMINE METHYLSULFATE 10 MG/10ML IV SOLN
INTRAVENOUS | Status: DC | PRN
Start: 2013-10-14 — End: 2013-10-14
  Administered 2013-10-14: 1 mg via INTRAVENOUS
  Administered 2013-10-14 (×2): 2 mg via INTRAVENOUS

## 2013-10-14 MED ORDER — BUPIVACAINE-EPINEPHRINE 0.5% -1:200000 IJ SOLN
INTRAMUSCULAR | Status: DC | PRN
  Administered 2013-10-14: 17 mL

## 2013-10-14 MED ORDER — MIDAZOLAM HCL 2 MG/2ML IJ SOLN
INTRAMUSCULAR | Status: DC | PRN
  Administered 2013-10-14: 2 mg via INTRAVENOUS

## 2013-10-14 MED ORDER — HYDROCODONE-ACETAMINOPHEN 5-325 MG PO TABS: 1.0000 | ORAL_TABLET | Freq: Four times a day (QID) | ORAL | Status: DC | PRN

## 2013-10-14 MED ORDER — ROCURONIUM BROMIDE 100 MG/10ML IV SOLN
INTRAVENOUS | Status: AC
  Filled 2013-10-14: qty 1

## 2013-10-14 MED ORDER — METOCLOPRAMIDE HCL 5 MG/ML IJ SOLN: 10.0000 mg | Freq: Once | INTRAMUSCULAR | Status: DC | PRN

## 2013-10-14 MED ORDER — EPHEDRINE 5 MG/ML INJ
INTRAVENOUS | Status: AC
  Filled 2013-10-14: qty 10

## 2013-10-14 MED ORDER — KETOROLAC TROMETHAMINE 30 MG/ML IJ SOLN
INTRAMUSCULAR | Status: DC | PRN
  Administered 2013-10-14: 30 mg via INTRAVENOUS

## 2013-10-14 MED ORDER — HYDROMORPHONE HCL PF 1 MG/ML IJ SOLN: 0.2500 mg | INTRAMUSCULAR | Status: DC | PRN

## 2013-10-14 MED ORDER — ONDANSETRON HCL 4 MG/2ML IJ SOLN: 4.0000 mg | Freq: Four times a day (QID) | INTRAMUSCULAR | Status: DC | PRN

## 2013-10-14 MED ORDER — LACTATED RINGERS IV SOLN
INTRAVENOUS | Status: DC
  Administered 2013-10-14 (×3): via INTRAVENOUS

## 2013-10-14 MED ORDER — TRAZODONE HCL 50 MG PO TABS
50.0000 mg | ORAL_TABLET | Freq: Every day | ORAL | Status: DC
  Administered 2013-10-14: 50 mg via ORAL
  Filled 2013-10-14 (×2): qty 1

## 2013-10-14 MED ORDER — LORATADINE 10 MG PO TABS
10.0000 mg | ORAL_TABLET | Freq: Every day | ORAL | Status: DC
  Administered 2013-10-15: 10 mg via ORAL
  Filled 2013-10-14 (×3): qty 1

## 2013-10-14 MED ORDER — PHENAZOPYRIDINE HCL 200 MG PO TABS
200.0000 mg | ORAL_TABLET | Freq: Once | ORAL | Status: AC
  Administered 2013-10-14: 200 mg via ORAL
  Filled 2013-10-14: qty 1

## 2013-10-14 MED ORDER — CEFAZOLIN SODIUM-DEXTROSE 2-3 GM-% IV SOLR: 2.0000 g | INTRAVENOUS | Status: DC

## 2013-10-14 MED ORDER — VENLAFAXINE HCL ER 75 MG PO CP24
75.0000 mg | ORAL_CAPSULE | Freq: Every day | ORAL | Status: DC
  Administered 2013-10-15: 75 mg via ORAL
  Filled 2013-10-14 (×2): qty 1

## 2013-10-14 MED ORDER — HYDROMORPHONE HCL 2 MG PO TABS
1.0000 mg | ORAL_TABLET | Freq: Four times a day (QID) | ORAL | Status: DC | PRN
  Administered 2013-10-15: 1 mg via ORAL
  Filled 2013-10-14 (×2): qty 1

## 2013-10-14 MED ORDER — HYDROMORPHONE HCL PF 1 MG/ML IJ SOLN
0.2500 mg | INTRAMUSCULAR | Status: DC | PRN
  Administered 2013-10-14 (×3): 0.5 mg via INTRAVENOUS

## 2013-10-14 MED ORDER — SODIUM CHLORIDE 0.9 % IJ SOLN: 9.0000 mL | INTRAMUSCULAR | Status: DC | PRN

## 2013-10-14 MED ORDER — ROCURONIUM BROMIDE 100 MG/10ML IV SOLN
INTRAVENOUS | Status: DC | PRN
  Administered 2013-10-14 (×2): 15 mg via INTRAVENOUS
  Administered 2013-10-14: 50 mg via INTRAVENOUS
  Administered 2013-10-14: 10 mg via INTRAVENOUS

## 2013-10-14 MED ORDER — DEXAMETHASONE SODIUM PHOSPHATE 4 MG/ML IJ SOLN
INTRAMUSCULAR | Status: AC
  Filled 2013-10-14: qty 1

## 2013-10-14 MED ORDER — KETOROLAC TROMETHAMINE 60 MG/2ML IM SOLN
INTRAMUSCULAR | Status: DC | PRN
  Administered 2013-10-14: 30 mg via INTRAMUSCULAR

## 2013-10-14 MED ORDER — ONDANSETRON HCL 4 MG/2ML IJ SOLN
INTRAMUSCULAR | Status: DC | PRN
  Administered 2013-10-14: 4 mg via INTRAVENOUS

## 2013-10-14 MED ORDER — CEFAZOLIN SODIUM-DEXTROSE 2-3 GM-% IV SOLR
INTRAVENOUS | Status: AC
  Administered 2013-10-14: 2 g via INTRAVENOUS
  Filled 2013-10-14: qty 50

## 2013-10-14 MED ORDER — ONDANSETRON HCL 4 MG PO TABS: 4.0000 mg | ORAL_TABLET | Freq: Four times a day (QID) | ORAL | Status: DC | PRN

## 2013-10-14 MED ORDER — HYDROMORPHONE HCL PF 1 MG/ML IJ SOLN
INTRAMUSCULAR | Status: AC
  Administered 2013-10-14: 0.5 mg via INTRAVENOUS
  Filled 2013-10-14: qty 1

## 2013-10-14 MED ORDER — BUPIVACAINE-EPINEPHRINE (PF) 0.5% -1:200000 IJ SOLN
INTRAMUSCULAR | Status: AC
  Filled 2013-10-14: qty 30

## 2013-10-14 MED ORDER — DEXAMETHASONE SODIUM PHOSPHATE 10 MG/ML IJ SOLN
INTRAMUSCULAR | Status: DC | PRN
  Administered 2013-10-14: 5 mg via INTRAVENOUS

## 2013-10-14 MED ORDER — LISINOPRIL 40 MG PO TABS
40.0000 mg | ORAL_TABLET | Freq: Every day | ORAL | Status: DC
  Administered 2013-10-15: 40 mg via ORAL
  Filled 2013-10-14 (×3): qty 1

## 2013-10-14 MED ORDER — GLYCOPYRROLATE 0.2 MG/ML IJ SOLN
INTRAMUSCULAR | Status: AC
  Filled 2013-10-14: qty 2

## 2013-10-14 MED ORDER — GLYCOPYRROLATE 0.2 MG/ML IJ SOLN
INTRAMUSCULAR | Status: AC
  Filled 2013-10-14: qty 5

## 2013-10-14 MED ORDER — ALPRAZOLAM 0.5 MG PO TABS
0.5000 mg | ORAL_TABLET | Freq: Three times a day (TID) | ORAL | Status: DC | PRN
  Administered 2013-10-15 (×2): 0.5 mg via ORAL
  Filled 2013-10-14 (×2): qty 1

## 2013-10-14 MED ORDER — EPHEDRINE SULFATE 50 MG/ML IJ SOLN
INTRAMUSCULAR | Status: DC | PRN
  Administered 2013-10-14 (×2): 10 mg via INTRAVENOUS

## 2013-10-14 MED ORDER — HYDROMORPHONE 0.3 MG/ML IV SOLN
INTRAVENOUS | Status: DC
  Administered 2013-10-14: 1.8 mg via INTRAVENOUS
  Administered 2013-10-14: 19:00:00 via INTRAVENOUS
  Administered 2013-10-15: 3.3 mg via INTRAVENOUS
  Administered 2013-10-15: 0.9 mg via INTRAVENOUS
  Filled 2013-10-14: qty 25

## 2013-10-14 MED ORDER — PROPOFOL 10 MG/ML IV EMUL
INTRAVENOUS | Status: AC
  Filled 2013-10-14: qty 20

## 2013-10-14 MED ORDER — NICOTINE 14 MG/24HR TD PT24
14.0000 mg | MEDICATED_PATCH | Freq: Every day | TRANSDERMAL | Status: DC
Start: 2013-10-14 — End: 2013-10-15
  Administered 2013-10-14 – 2013-10-15 (×2): 14 mg via TRANSDERMAL
  Filled 2013-10-14 (×3): qty 1

## 2013-10-14 MED ORDER — IBUPROFEN 800 MG PO TABS
800.0000 mg | ORAL_TABLET | Freq: Three times a day (TID) | ORAL | Status: DC | PRN
  Administered 2013-10-15: 800 mg via ORAL
  Filled 2013-10-14: qty 1

## 2013-10-14 MED ORDER — SODIUM CHLORIDE 0.9 % IJ SOLN
INTRAMUSCULAR | Status: AC
  Filled 2013-10-14: qty 100

## 2013-10-14 MED ORDER — PHENYLEPHRINE HCL 10 MG/ML IJ SOLN
INTRAMUSCULAR | Status: DC | PRN
  Administered 2013-10-14 (×3): 40 mg via INTRAVENOUS

## 2013-10-14 MED ORDER — SODIUM CHLORIDE 0.9 % IJ SOLN
INTRAMUSCULAR | Status: DC | PRN
  Administered 2013-10-14: 10 mL

## 2013-10-14 MED ORDER — SIMETHICONE 80 MG PO CHEW: 80.0000 mg | CHEWABLE_TABLET | Freq: Four times a day (QID) | ORAL | Status: DC | PRN

## 2013-10-14 MED ORDER — PHENYLEPHRINE 40 MCG/ML (10ML) SYRINGE FOR IV PUSH (FOR BLOOD PRESSURE SUPPORT)
PREFILLED_SYRINGE | INTRAVENOUS | Status: AC
  Filled 2013-10-14: qty 5

## 2013-10-14 MED ORDER — NEOSTIGMINE METHYLSULFATE 10 MG/10ML IV SOLN
INTRAVENOUS | Status: AC
  Filled 2013-10-14: qty 1

## 2013-10-14 MED ORDER — ENOXAPARIN SODIUM 40 MG/0.4ML ~~LOC~~ SOLN
40.0000 mg | SUBCUTANEOUS | Status: DC
  Administered 2013-10-15: 40 mg via SUBCUTANEOUS
  Filled 2013-10-14 (×2): qty 0.4

## 2013-10-14 MED ORDER — SCOPOLAMINE 1 MG/3DAYS TD PT72
1.0000 | MEDICATED_PATCH | Freq: Once | TRANSDERMAL | Status: DC
  Administered 2013-10-14: 1.5 mg via TRANSDERMAL

## 2013-10-14 MED ORDER — GLYCOPYRROLATE 0.2 MG/ML IJ SOLN
INTRAMUSCULAR | Status: AC
  Filled 2013-10-14: qty 1

## 2013-10-14 MED ORDER — SODIUM CHLORIDE 0.9 % IJ SOLN
INTRAMUSCULAR | Status: AC
  Filled 2013-10-14: qty 10

## 2013-10-14 MED ORDER — VASOPRESSIN 20 UNIT/ML IJ SOLN
INTRAMUSCULAR | Status: AC
  Filled 2013-10-14: qty 1

## 2013-10-14 MED ORDER — ONDANSETRON HCL 4 MG/2ML IJ SOLN
INTRAMUSCULAR | Status: AC
  Filled 2013-10-14: qty 2

## 2013-10-14 MED ORDER — SCOPOLAMINE 1 MG/3DAYS TD PT72
MEDICATED_PATCH | TRANSDERMAL | Status: AC
  Administered 2013-10-14: 1.5 mg via TRANSDERMAL
  Filled 2013-10-14: qty 1

## 2013-10-14 MED ORDER — KETOROLAC TROMETHAMINE 30 MG/ML IJ SOLN
30.0000 mg | Freq: Four times a day (QID) | INTRAMUSCULAR | Status: DC
  Administered 2013-10-14 – 2013-10-15 (×2): 30 mg via INTRAVENOUS
  Filled 2013-10-14 (×2): qty 1

## 2013-10-14 MED ORDER — FENTANYL CITRATE 0.05 MG/ML IJ SOLN
INTRAMUSCULAR | Status: DC | PRN
  Administered 2013-10-14 (×2): 100 ug via INTRAVENOUS
  Administered 2013-10-14: 50 ug via INTRAVENOUS

## 2013-10-14 MED ORDER — LIDOCAINE HCL (CARDIAC) 20 MG/ML IV SOLN
INTRAVENOUS | Status: DC | PRN
  Administered 2013-10-14: 70 mg via INTRAVENOUS

## 2013-10-14 MED ORDER — FENTANYL CITRATE 0.05 MG/ML IJ SOLN
INTRAMUSCULAR | Status: AC
  Filled 2013-10-14: qty 5

## 2013-10-14 MED ORDER — DEXTROSE IN LACTATED RINGERS 5 % IV SOLN
INTRAVENOUS | Status: DC
  Administered 2013-10-14 – 2013-10-15 (×2): via INTRAVENOUS

## 2013-10-14 MED ORDER — NALOXONE HCL 0.4 MG/ML IJ SOLN: 0.4000 mg | INTRAMUSCULAR | Status: DC | PRN

## 2013-10-14 MED ORDER — VASOPRESSIN 20 UNIT/ML IJ SOLN
INTRAVENOUS | Status: DC | PRN
  Administered 2013-10-14: 15:00:00 via INTRAMUSCULAR

## 2013-10-14 MED ORDER — KETOROLAC TROMETHAMINE 30 MG/ML IJ SOLN: 30.0000 mg | Freq: Four times a day (QID) | INTRAMUSCULAR | Status: DC

## 2013-10-14 SURGICAL SUPPLY — 51 items
BLADE SURG 10 STRL SS (BLADE) ×5 IMPLANT
BLADE SURG 11 STRL SS (BLADE) ×10 IMPLANT
CABLE HIGH FREQUENCY MONO STRZ (ELECTRODE) IMPLANT
CATH ROBINSON RED A/P 16FR (CATHETERS) IMPLANT
CLOSURE WOUND 1/4 X3 (GAUZE/BANDAGES/DRESSINGS)
CLOTH BEACON ORANGE TIMEOUT ST (SAFETY) ×5 IMPLANT
CONT PATH 16OZ SNAP LID 3702 (MISCELLANEOUS) ×5 IMPLANT
COVER LIGHT HANDLE  1/PK (MISCELLANEOUS) ×2
COVER LIGHT HANDLE 1/PK (MISCELLANEOUS) IMPLANT
COVER TABLE BACK 60X90 (DRAPES) ×5 IMPLANT
DECANTER SPIKE VIAL GLASS SM (MISCELLANEOUS) IMPLANT
DRAPE LG THREE QUARTER DISP (DRAPES) ×10 IMPLANT
DRSG COVADERM PLUS 2X2 (GAUZE/BANDAGES/DRESSINGS) ×8 IMPLANT
DRSG OPSITE POSTOP 3X4 (GAUZE/BANDAGES/DRESSINGS) ×2 IMPLANT
DURAPREP 26ML APPLICATOR (WOUND CARE) ×5 IMPLANT
ELECT REM PT RETURN 9FT ADLT (ELECTROSURGICAL) ×5
ELECTRODE REM PT RTRN 9FT ADLT (ELECTROSURGICAL) ×3 IMPLANT
GAUZE SPONGE 4X4 16PLY XRAY LF (GAUZE/BANDAGES/DRESSINGS) ×5 IMPLANT
GAUZE VASELINE 3X9 (GAUZE/BANDAGES/DRESSINGS) IMPLANT
GLOVE BIOGEL PI IND STRL 6.5 (GLOVE) IMPLANT
GLOVE BIOGEL PI IND STRL 7.0 (GLOVE) ×6 IMPLANT
GLOVE BIOGEL PI IND STRL 8.5 (GLOVE) ×3 IMPLANT
GLOVE BIOGEL PI INDICATOR 6.5 (GLOVE)
GLOVE BIOGEL PI INDICATOR 7.0 (GLOVE) ×4
GLOVE BIOGEL PI INDICATOR 8.5 (GLOVE) ×2
GLOVE ECLIPSE 8.0 STRL XLNG CF (GLOVE) ×20 IMPLANT
GOWN STRL REUS W/ TWL LRG LVL3 (GOWN DISPOSABLE) ×21 IMPLANT
GOWN STRL REUS W/TWL LRG LVL3 (GOWN DISPOSABLE) ×35
NEEDLE MAYO .5 CIRCLE (NEEDLE) ×5 IMPLANT
NS IRRIG 1000ML POUR BTL (IV SOLUTION) ×5 IMPLANT
PACK LAVH (CUSTOM PROCEDURE TRAY) ×5 IMPLANT
PROTECTOR NERVE ULNAR (MISCELLANEOUS) ×5 IMPLANT
SET IRRIG TUBING LAPAROSCOPIC (IRRIGATION / IRRIGATOR) IMPLANT
SOLUTION ELECTROLUBE (MISCELLANEOUS) ×5 IMPLANT
STRIP CLOSURE SKIN 1/4X3 (GAUZE/BANDAGES/DRESSINGS) IMPLANT
SUT CHROMIC 1 CT1 27 (SUTURE) IMPLANT
SUT MNCRL AB 3-0 PS2 27 (SUTURE) ×5 IMPLANT
SUT VIC AB 0 CT1 18XCR BRD8 (SUTURE) ×9 IMPLANT
SUT VIC AB 0 CT1 27 (SUTURE) ×15
SUT VIC AB 0 CT1 27XBRD ANBCTR (SUTURE) ×9 IMPLANT
SUT VIC AB 0 CT1 8-18 (SUTURE) ×15
SUT VIC AB 2-0 SH 27 (SUTURE) ×5
SUT VIC AB 2-0 SH 27XBRD (SUTURE) ×3 IMPLANT
SUT VICRYL 0 ENDOLOOP (SUTURE) ×6 IMPLANT
SUT VICRYL 0 TIES 12 18 (SUTURE) ×5 IMPLANT
SUT VICRYL 0 UR6 27IN ABS (SUTURE) ×10 IMPLANT
SYRINGE 60CC LL (MISCELLANEOUS) ×5 IMPLANT
TOWEL OR 17X24 6PK STRL BLUE (TOWEL DISPOSABLE) ×10 IMPLANT
TRAY FOLEY CATH 14FR (SET/KITS/TRAYS/PACK) ×5 IMPLANT
WARMER LAPAROSCOPE (MISCELLANEOUS) ×5 IMPLANT
WATER STERILE IRR 1000ML POUR (IV SOLUTION) ×5 IMPLANT

## 2013-10-14 NOTE — Op Note (Signed)
OPERATIVE NOTE  Brittany Blair  DOB:    [birth year]  MRN:    [id number]  CSN:    [id number]  Date of Surgery:  10/14/2013  Preoperative Diagnosis:  Postmenopausal bleeding  Estrogen receptor positive breast cancer  Anemia  Anxiety  Depression  Hypertension  Overweight  Postoperative Diagnosis:  Same  Pelvic adhesions  Procedure:  Laparoscopy Assisted Vaginal Hysterectomy  Laparoscopic bilateral salpingo-oophorectomy  Laparoscopic lysis of adhesions  Cystoscopy  Surgeon:  Gildardo Cranker, M.D.  Assistant:  Kendall Flack M.D.  Anesthetic:  General  Disposition:  The patient presents with the above-mentioned diagnosis. She understands the indications for her surgical procedure.  She also understands the alternative treatment options. She accepts the risk of, but not limited to, anesthetic complications, bleeding, infections, and possible damage to the surrounding organs.  Findings:  The fallopian tubes and ovaries appeared normal. The appendix, bowel, and upper abdomen appeared normal. The uterus was upper limits normal size. There were moderate adhesions between the bowel and the left pelvic sidewall. Cystoscopy was performed at the end of our procedure. Urine was seen passing through both ureteral orifices. No pathology was appreciated within the bladder.  Procedure:  The patient was taken to the operating room where a general anesthetic was given. The patient's  abdomen was prepped with ChloraPrep. The perineum and vagina were prepped with multiple layers of Betadine.  An examination under anesthesia was performed. A Hulka tenaculum was placed inside the uterus. The patient was sterilely draped. A Foley catheter was placed in the bladder. The subumbilical area was injected with half percent Marcaine with epinephrine. An incision was made and the Veress needle was inserted into the abdominal cavity without difficulty. Proper placement was confirmed  using the saline drop test. A pneumoperitoneum was obtained. The laparoscopic trocar and the laparoscope were substituted for the Veress needle. Operative findings are mentioned above. The mid abdomen was injected with half percent Marcaine with epinephrine to the right of the midline. A small incision was made and a 5 mm trocar was inserted into the abdominal cavity under direct visualization. An identical procedure was carried out on the opposite side. Pictures were taken of the patient's pelvic structures. The right fallopian tube was identified and followed to its fimbriated end. The proximal portion of the right fallopian tube was cauterized and cut. The right upper pedicle was then isolated. The right utero-ovarian ligament was cauterized and cut. The right infundibulopelvic ligament was isolated. The ureter was identified along the right pelvic sidewall and it was noted to be away from our surgical area. 2 Endoloop sutures were placed on the right infundibulopelvic ligament. The ligament was then cut removing the ovary. The specimen was placed in the posterior cul-de-sac. Hemostasis was adequate. An identical procedure was carried out on the opposite side. The adhesions between the bowel and the left pelvic sidewall were then removed using a combination of sharp and blunt dissection. Care was taken not to damage the bowel or the pelvic sidewall. At this point we felt we were ready to proceed with the vaginal portion of the procedure. The patient was placed in a more lithotomy position. A weighted speculum was placed in the posterior vagina. The cervix was injected with half percent Marcaine with epinephrine. A circumferential incision was made around the cervix. The vaginal mucosa was advanced anteriorly and posteriorly. The anterior cul-de-sac and in the posterior cul-de-sac were sharply entered. Alternating from right to left the uterosacral ligaments, paracervical tissues, parametrial tissues, and uterine  arteries were clamped, cut, sutured, and tied securely. The uterus was removed from the operative field. Hemostasis was confirmed. The sutures attached to the uterosacral ligaments were brought out through the vaginal angles and then tied securely. A McCall culdoplasty suture was placed in the posterior cul-de-sac incorporating the uterosacral ligaments bilaterally and the posterior peritoneum. A final check was made for hemostasis and again hemostasis was confirmed. The vaginal cuff was closed using figure-of-eight sutures incorporating the anterior vaginal mucosa, the anterior peritoneum, posterior peritoneum, and the posterior vaginal mucosa. The McCall culdoplasty suture was tied securely and the apex of the vagina was noted to elevate into the midpelvis. The Foley catheter was removed and the cystoscope was inserted in the bladder after prepping the urethra with Betadine. Urine was noted to pass quickly through both ureteral orifices. The bladder mucosa was carefully inspected. No pathology was appreciated. The cystoscope was removed. The Foley catheter was reinserted after prepping the urethra once again with Betadine. The operator then changed gown and gloves. The pneumoperitoneum was reestablished. The pelvis was inspected and hemostasis was adequate. The pelvis was irrigated. We felt that we were ready to end the procedure. The 5 mm trochars were removed under direct visualization. The pneumoperitoneum was allowed to escape. The subumbilical trocar was removed. The subumbilical incision was closed using a deep suture of 0 Vicryl followed by skin closures of 3-0 Monocryl. The patient tolerated her procedure well. She was awakened from her anesthetic without difficulty and then transported to the recovery room in stable condition. Sponge, needle, and instrument counts were correct on 2 occasions. The estimated blood loss was 75 cc's. 0 Vicryl is the suture material used throughout the procedure. The uterus,  bilateral fallopian tubes, and bilateral ovaries were sent to pathology.   Gildardo Cranker, M.D.

## 2013-10-14 NOTE — Anesthesia Procedure Notes (Addendum)
Performed by: Clifton Custard R   Procedure Name: Intubation Date/Time: 10/14/2013 1:50 PM Performed by: Bufford Spikes Pre-anesthesia Checklist: Patient identified, Emergency Drugs available, Suction available, Patient being monitored and Timeout performed Patient Re-evaluated:Patient Re-evaluated prior to inductionOxygen Delivery Method: Circle system utilized Preoxygenation: Pre-oxygenation with 100% oxygen Intubation Type: IV induction Ventilation: Mask ventilation without difficulty Laryngoscope Size: Miller and 2 Grade View: Grade I Tube type: Oral Tube size: 7.0 mm Number of attempts: 1 Placement Confirmation: ETT inserted through vocal cords under direct vision,  positive ETCO2 and breath sounds checked- equal and bilateral Secured at: 21 cm Tube secured with: Tape Dental Injury: Teeth and Oropharynx as per pre-operative assessment

## 2013-10-14 NOTE — H&P (Signed)
The patient was interviewed and examined today.  The previously documented history and physical examination was reviewed. There are no changes. The operative procedure was reviewed. The risks and benefits were outlined again. The specific risks include, but are not limited to, anesthetic complications, bleeding, infections, and possible damage to the surrounding organs. The patient's questions were answered.  We are ready to proceed as outlined. The likelihood of the patient achieving the goals of this procedure is very likely.  CBC    Component Value Date/Time   WBC 7.5 10/07/2013 0926   WBC 7.1 01/24/2012 1539   RBC 3.33* 10/07/2013 0926   RBC 4.39 01/24/2012 1539   HGB 11.8* 10/07/2013 0926   HGB 14.7 01/24/2012 1539   HCT 34.5* 10/07/2013 0926   HCT 43.7 01/24/2012 1539   PLT 261 10/07/2013 0926   PLT 258 01/24/2012 1539   MCV 103.6* 10/07/2013 0926   MCV 99.5 01/24/2012 1539   MCH 35.4* 10/07/2013 0926   MCH 33.5 01/24/2012 1539   MCHC 34.2 10/07/2013 0926   MCHC 33.6 01/24/2012 1539   RDW 12.7 10/07/2013 0926   RDW 14.1 01/24/2012 1539   LYMPHSABS 1.9 07/30/2013 1400   LYMPHSABS 1.4 01/24/2012 1539   MONOABS 0.5 07/30/2013 1400   MONOABS 0.6 01/24/2012 1539   EOSABS 0.4 07/30/2013 1400   EOSABS 0.3 01/24/2012 1539   BASOSABS 0.0 07/30/2013 1400   BASOSABS 0.0 01/24/2012 1539    BP 126/86  Temp(Src) 97.9 F (36.6 C) (Oral)  Resp 20  SpO2 100%  LMP 05/29/2013    Gildardo Cranker, M.D.

## 2013-10-14 NOTE — Anesthesia Preprocedure Evaluation (Signed)
Anesthesia Evaluation  Patient identified by MRN, date of birth, ID band Patient awake    Reviewed: Allergy & Precautions, H&P , NPO status , Patient's Chart, lab work & pertinent test results, reviewed documented beta blocker date and time   Airway Mallampati: III TM Distance: >3 FB Neck ROM: Full    Dental no notable dental hx. (+) Teeth Intact   Pulmonary former smoker,  breath sounds clear to auscultation  Pulmonary exam normal       Cardiovascular hypertension, Pt. on medications and Pt. on home beta blockers Rhythm:Regular Rate:Normal  Chronic chest pain Abnormal EKG preop- non specific ST-Twave changes  Had Echo and ETT Cardiolite normal except breast attenuation shadow apical anteroseptal wall.   Neuro/Psych PSYCHIATRIC DISORDERS Anxiety Depression negative neurological ROS     GI/Hepatic negative GI ROS, Neg liver ROS,   Endo/Other  Obesity Breast Ca  Renal/GU negative Renal ROS  negative genitourinary   Musculoskeletal   Abdominal (+) + obese,   Peds  Hematology   Anesthesia Other Findings   Reproductive/Obstetrics Cystic endometrium PMB Personal Hx/o Breast Ca                           Anesthesia Physical Anesthesia Plan  ASA: III  Anesthesia Plan: General   Post-op Pain Management:    Induction: Intravenous  Airway Management Planned: Oral ETT  Additional Equipment:   Intra-op Plan:   Post-operative Plan: Extubation in OR  Informed Consent: I have reviewed the patients History and Physical, chart, labs and discussed the procedure including the risks, benefits and alternatives for the proposed anesthesia with the patient or authorized representative who has indicated his/her understanding and acceptance.   Dental advisory given  Plan Discussed with: CRNA, Anesthesiologist and Surgeon  Anesthesia Plan Comments:         Anesthesia Quick Evaluation

## 2013-10-14 NOTE — Transfer of Care (Signed)
Immediate Anesthesia Transfer of Care Note  Patient: Sherrol Docken  Procedure(s) Performed: Procedure(s): LAPAROSCOPIC ASSISTED VAGINAL HYSTERECTOMY (Bilateral) CYSTOSCOPY (N/A) LAPAROSCOPIC LYSIS OF ADHESIONS (N/A) LAPAROSCOPIC BILATERAL SALPINGO OOPHORECTOMY (Bilateral)  Patient Location: PACU  Anesthesia Type:General  Level of Consciousness: awake and oriented  Airway & Oxygen Therapy: Patient Spontanous Breathing and Patient connected to nasal cannula oxygen  Post-op Assessment: Report given to PACU RN and Post -op Vital signs reviewed and stable  Post vital signs: Reviewed and stable  Complications: No apparent anesthesia complications

## 2013-10-15 LAB — CBC
HCT: 30.4 % — ABNORMAL LOW (ref 36.0–46.0)
HEMOGLOBIN: 10 g/dL — AB (ref 12.0–15.0)
MCH: 33.9 pg (ref 26.0–34.0)
MCHC: 32.9 g/dL (ref 30.0–36.0)
MCV: 103.1 fL — ABNORMAL HIGH (ref 78.0–100.0)
Platelets: 249 10*3/uL (ref 150–400)
RBC: 2.95 MIL/uL — AB (ref 3.87–5.11)
RDW: 12.7 % (ref 11.5–15.5)
WBC: 11.3 10*3/uL — AB (ref 4.0–10.5)

## 2013-10-15 MED ORDER — PROMETHAZINE HCL 12.5 MG PO TABS: 12.5000 mg | ORAL_TABLET | Freq: Four times a day (QID) | ORAL | Status: DC | PRN

## 2013-10-15 MED ORDER — HYDROCODONE-ACETAMINOPHEN 5-325 MG PO TABS: 1.0000 | ORAL_TABLET | ORAL | Status: DC | PRN

## 2013-10-15 MED ORDER — HYDROCODONE-ACETAMINOPHEN 5-325 MG PO TABS
1.0000 | ORAL_TABLET | ORAL | Status: DC | PRN
  Administered 2013-10-15: 1 via ORAL
  Filled 2013-10-15: qty 1

## 2013-10-15 MED ORDER — IBUPROFEN 800 MG PO TABS: 800.0000 mg | ORAL_TABLET | Freq: Three times a day (TID) | ORAL | Status: DC | PRN

## 2013-10-15 MED ORDER — HYDROCODONE-ACETAMINOPHEN 5-300 MG PO TABS: 1.0000 | ORAL_TABLET | ORAL | Status: DC | PRN

## 2013-10-15 MED ORDER — FERROUS SULFATE 325 (65 FE) MG PO TABS: 325.0000 mg | ORAL_TABLET | Freq: Two times a day (BID) | ORAL | Status: DC

## 2013-10-15 NOTE — Addendum Note (Signed)
Addendum created 10/15/13 0759 by Ignacia Bayley, CRNA   Modules edited: Notes Section   Notes Section:  File: [id number]

## 2013-10-15 NOTE — Discharge Instructions (Signed)
Hysterectomy Information  A hysterectomy is a surgery in which your uterus is removed. This surgery may be done to treat various medical problems. After the surgery, you will no longer have menstrual periods. The surgery will also make you unable to become pregnant (sterile). The fallopian tubes and ovaries can be removed (bilateral salpingo-oophorectomy) during this surgery as well.  REASONS FOR A HYSTERECTOMY  Persistent, abnormal bleeding.  Lasting (chronic) pelvic pain or infection.  The lining of the uterus (endometrium) starts growing outside the uterus (endometriosis).  The endometrium starts growing in the muscle of the uterus (adenomyosis).  The uterus falls down into the vagina (pelvic organ prolapse).  Noncancerous growths in the uterus (uterine fibroids) that cause symptoms.  Precancerous cells.  Cervical cancer or uterine cancer. TYPES OF HYSTERECTOMIES  Supracervical hysterectomy--In this type, the top part of the uterus is removed, but not the cervix.  Total hysterectomy--The uterus and cervix are removed.  Radical hysterectomy--The uterus, the cervix, and the fibrous tissue that holds the uterus in place in the pelvis (parametrium) are removed. WAYS A HYSTERECTOMY CAN BE PERFORMED  Abdominal hysterectomy--A large surgical cut (incision) is made in the abdomen. The uterus is removed through this incision.  Vaginal hysterectomy--An incision is made in the vagina. The uterus is removed through this incision. There are no abdominal incisions.  Conventional laparoscopic hysterectomy--Three or four small incisions are made in the abdomen. A thin, lighted tube with a camera (laparoscope) is inserted into one of the incisions. Other tools are put through the other incisions. The uterus is cut into small pieces. The small pieces are removed through the incisions, or they are removed through the vagina.  Laparoscopically assisted vaginal hysterectomy (LAVH)--Three or four  small incisions are made in the abdomen. Part of the surgery is performed laparoscopically and part vaginally. The uterus is removed through the vagina.  Robot-assisted laparoscopic hysterectomy--A laparoscope and other tools are inserted into 3 or 4 small incisions in the abdomen. A computer-controlled device is used to give the surgeon a 3D image and to help control the surgical instruments. This allows for more precise movements of surgical instruments. The uterus is cut into small pieces and removed through the incisions or removed through the vagina. RISKS AND COMPLICATIONS  Possible complications associated with this procedure include:  Bleeding and risk of blood transfusion. Tell your health care provider if you do not want to receive any blood products.  Blood clots in the legs or lung.  Infection.  Injury to surrounding organs.  Problems or side effects related to anesthesia.  Conversion to an abdominal hysterectomy from one of the other techniques. WHAT TO EXPECT AFTER A HYSTERECTOMY  You will be given pain medicine.  You will need to have someone with you for the first 3-5 days after you go home.  You will need to follow up with your surgeon in 2-4 weeks after surgery to evaluate your progress.  You may have early menopause symptoms such as hot flashes, night sweats, and insomnia.  If you had a hysterectomy for a problem that was not cancer or not a condition that could lead to cancer, then you no longer need Pap tests. However, even if you no longer need a Pap test, a regular exam is a good idea to make sure no other problems are starting. Document Released: 08/07/2000 Document Revised: 12/02/2012 Document Reviewed: 10/19/2012 [facility] Patient Information 2015 [place], [place]. This information is not intended to replace advice given to you by your health care  provider. Make sure you discuss any questions you have with your health care provider. ° °

## 2013-10-15 NOTE — Anesthesia Postprocedure Evaluation (Signed)
  Anesthesia Post-op Note  Patient: Brittany Blair  Procedure(s) Performed: Procedure(s): LAPAROSCOPIC ASSISTED VAGINAL HYSTERECTOMY (Bilateral) CYSTOSCOPY (N/A) LAPAROSCOPIC LYSIS OF ADHESIONS (N/A) LAPAROSCOPIC BILATERAL SALPINGO OOPHORECTOMY (Bilateral)  Patient Location: [facility]  Anesthesia Type:General  Level of Consciousness: awake  Airway and Oxygen Therapy: Patient Spontanous Breathing  Post-op Pain: mild  Post-op Assessment: Patient's Cardiovascular Status Stable and Respiratory Function Stable  Post-op Vital Signs: stable  Last Vitals:  Filed Vitals:   10/15/13 0549  BP: 115/74  Pulse: 97  Temp: 37.3 C  Resp: 12    Complications: No apparent anesthesia complications

## 2013-10-15 NOTE — Anesthesia Postprocedure Evaluation (Signed)
  Anesthesia Post-op Note  Patient: Brittany Blair  Procedure(s) Performed: Procedure(s): LAPAROSCOPIC ASSISTED VAGINAL HYSTERECTOMY (Bilateral) CYSTOSCOPY (N/A) LAPAROSCOPIC LYSIS OF ADHESIONS (N/A) LAPAROSCOPIC BILATERAL SALPINGO OOPHORECTOMY (Bilateral) Patient is awake and responsive. Pain and nausea are reasonably well controlled. Vital signs are stable and clinically acceptable. Oxygen saturation is clinically acceptable. There are no apparent anesthetic complications at this time. Patient is ready for discharge.

## 2013-10-15 NOTE — Progress Notes (Signed)
Pt and her husband/caregiver verbalize understanding of d/c instructions, medications, follow up appt, and when to seek medical attn. Pt has no questions at this time. IV d/c without complications. Pt d/c to main entrance via wheelchair accompanied by NT. Pts husband will be driving her home. Brittany Blair

## 2013-10-16 ENCOUNTER — Encounter (HOSPITAL_COMMUNITY): Payer: Self-pay | Admitting: Obstetrics and Gynecology

## 2013-10-18 NOTE — Discharge Summary (Signed)
Physician Discharge Summary  Patient ID: Brittany Blair MRN:          [id number] DOB/AGE:  1960-04-17 53 y.o.  Admit date:         10/14/2013 Discharge date: 10/15/2013  Admission Diagnoses:  Postmenopausal Bleeding  Cystic Endometrium  Estrogen receptor positive breast cancer  Anxiety  Depression  Hypertension  Anemia  Obesity  Discharge Diagnoses:   Same  Lesions  Adenomyosis  Procedures this Admission:  10/14/2013  Procedure(s) (LRB): LAPAROSCOPIC ASSISTED VAGINAL HYSTERECTOMY (Bilateral) CYSTOSCOPY (N/A) LAPAROSCOPIC LYSIS OF ADHESIONS (N/A) LAPAROSCOPIC BILATERAL SALPINGO OOPHORECTOMY (Bilateral)  Discharged Condition: good   Admission Hx and PE: The patient has been followed at the [facility]. She has a history of  Postmenopausal Bleeding, Cystic Endometrium, estrogen receptor positive breast cancer, anxiety, and depression. She wants to resume tamoxifen therapy. Please see her documented history and physical exam.   Hospital course:  On the day of admission, the patient underwent the following Procedure(s): LAPAROSCOPIC ASSISTED VAGINAL HYSTERECTOMY CYSTOSCOPY LAPAROSCOPIC LYSIS OF ADHESIONS LAPAROSCOPIC BILATERAL SALPINGO OOPHORECTOMY.   Operative findings included a slightly large uterus. She was also noted to have adhesions between the bowel and the left pelvic sidewall. Her postoperative course was uneventful. She quickly tolerated a regular diet. Her postoperative pain was controlled with oral medication. She remained afebrile. Today she was felt to be ready for discharge.  Labs:  HGB  Date Value Ref Range Status  01/24/2012 14.7  11.6 - 15.9 g/dL Final     Hemoglobin  Date Value Ref Range Status  10/15/2013 10.0* 12.0 - 15.0 g/dL Final     HCT  Date Value Ref Range Status  10/15/2013 30.4* 36.0 - 46.0 % Final  01/24/2012 43.7  34.8 - 46.6 % Final    Consults: cardiology was  consulted preoperatively. She was felt to be a good candidate for surgery.  Final pathology report:   Adenomyosis. Otherwise benign uterus, cervix, fallopian tubes, and ovaries.  Disposition:  The patient will be discharged to home. She has been given a copy of the discharge instructions as prepared by the [facility] for patients who have undergone the Procedure(s): [place].      Medication List    STOP taking these medications       acetaminophen 500 MG tablet  Commonly known as:  TYLENOL     HYDROcodone-acetaminophen 5-325 MG per tablet  Commonly known as:  NORCO/VICODIN  Replaced by:  Hydrocodone-Acetaminophen 5-300 MG Tabs     nicotine 14 mg/24hr patch  Commonly known as:  NICODERM CQ - dosed in mg/24 hours     tamoxifen 10 MG tablet  Commonly known as:  NOLVADEX      TAKE these medications       acyclovir 400 MG tablet  Commonly known as:  ZOVIRAX  TAKE 1 TABLET BY MOUTH TWO TIMES DAILY     ALPRAZolam 0.5 MG tablet  Commonly known as:  XANAX  Take 1 tablet (0.5 mg total) by mouth 3 (three) times daily as needed for anxiety.     amLODipine 5 MG tablet  Commonly known as:  NORVASC  tablet daily     clotrimazole 1 % cream  Commonly known as:  LOTRIMIN  Apply topically 2 (two) times daily.     diphenhydrAMINE 25 MG tablet  Commonly known as:  BENADRYL  Take 25 mg by mouth every  6 (six) hours as needed for allergies.     ferrous sulfate 325 (65 FE) MG tablet  Commonly known as:  FERROUSUL  Take 1 tablet (325 mg total) by mouth 2 (two) times daily with a meal.     Hydrocodone-Acetaminophen 5-300 MG Tabs  Commonly known as:  VICODIN  Take 1 tablet by mouth every 4 (four) hours as needed.     HYDROcodone-acetaminophen 5-325 MG per tablet  Commonly known as:  NORCO/VICODIN  Take 1 tablet by mouth every 4 (four)  hours as needed (pain).     ibuprofen 800 MG tablet  Commonly known as:  ADVIL,MOTRIN  Take 1 tablet (800 mg total) by mouth every 8 (eight) hours as needed.     lisinopril 40 MG tablet  Commonly known as:  PRINIVIL,ZESTRIL  TAKE 1 TABLET BY MOUTH ONCE DAILY     loratadine 10 MG tablet  Commonly known as:  CLARITIN  Take 10 mg by mouth daily.     metoprolol succinate 25 MG 24 hr tablet  Commonly known as:  TOPROL-XL  TAKE 3 TABLETS BY MOUTH ONCE DAILY WITH OR IMMEDIATELY FOLLOWING A MEAL     promethazine 12.5 MG tablet  Commonly known as:  PHENERGAN  Take 1 tablet (12.5 mg total) by mouth every 6 (six) hours as needed for nausea or vomiting.     tetrahydrozoline-zinc 0.05-0.25 % ophthalmic solution  Commonly known as:  VISINE-AC  Place 2 drops into both eyes 3 (three) times daily as needed (Red Eyes).     traZODone 50 MG tablet  Commonly known as:  DESYREL  Take 1 tablet (50 mg total) by mouth at bedtime.     venlafaxine XR 75 MG 24 hr capsule  Commonly known as:  EFFEXOR-XR  TAKE 3 CAPSULES BY MOUTH DAILY           Follow-up Information   Follow up with Eli Hose, MD In 6 weeks.   Specialty:  Obstetrics and Gynecology   Contact information:   [address] [place] [address] [phone]       Signed: Eli Hose 10/18/2013, 7:15 PM

## 2013-10-21 ENCOUNTER — Other Ambulatory Visit (HOSPITAL_COMMUNITY): Payer: Self-pay | Admitting: Oncology

## 2013-10-26 ENCOUNTER — Encounter (HOSPITAL_COMMUNITY): Payer: Self-pay | Admitting: Emergency Medicine

## 2013-10-26 ENCOUNTER — Inpatient Hospital Stay (HOSPITAL_COMMUNITY)
Admission: EM | Admit: 2013-10-26 | Discharge: 2013-10-28 | DRG: 384 | Disposition: A | Discharge status: Home / Self Care | Payer: Self-pay | Attending: Internal Medicine | Admitting: Internal Medicine

## 2013-10-26 ENCOUNTER — Emergency Department (HOSPITAL_COMMUNITY): Payer: Self-pay

## 2013-10-26 DIAGNOSIS — Z853 Personal history of malignant neoplasm of breast: Secondary | ICD-10-CM

## 2013-10-26 DIAGNOSIS — Z90710 Acquired absence of both cervix and uterus: Secondary | ICD-10-CM

## 2013-10-26 DIAGNOSIS — Z9221 Personal history of antineoplastic chemotherapy: Secondary | ICD-10-CM

## 2013-10-26 DIAGNOSIS — Z87891 Personal history of nicotine dependence: Secondary | ICD-10-CM

## 2013-10-26 DIAGNOSIS — Z8614 Personal history of Methicillin resistant Staphylococcus aureus infection: Secondary | ICD-10-CM

## 2013-10-26 DIAGNOSIS — F411 Generalized anxiety disorder: Secondary | ICD-10-CM | POA: Diagnosis present

## 2013-10-26 DIAGNOSIS — K922 Gastrointestinal hemorrhage, unspecified: Secondary | ICD-10-CM | POA: Diagnosis present

## 2013-10-26 DIAGNOSIS — F3289 Other specified depressive episodes: Secondary | ICD-10-CM | POA: Diagnosis present

## 2013-10-26 DIAGNOSIS — N179 Acute kidney failure, unspecified: Secondary | ICD-10-CM | POA: Diagnosis present

## 2013-10-26 DIAGNOSIS — R7989 Other specified abnormal findings of blood chemistry: Secondary | ICD-10-CM | POA: Diagnosis present

## 2013-10-26 DIAGNOSIS — Z923 Personal history of irradiation: Secondary | ICD-10-CM

## 2013-10-26 DIAGNOSIS — D649 Anemia, unspecified: Secondary | ICD-10-CM | POA: Diagnosis present

## 2013-10-26 DIAGNOSIS — R109 Unspecified abdominal pain: Secondary | ICD-10-CM | POA: Diagnosis present

## 2013-10-26 DIAGNOSIS — D62 Acute posthemorrhagic anemia: Secondary | ICD-10-CM | POA: Diagnosis present

## 2013-10-26 DIAGNOSIS — R55 Syncope and collapse: Secondary | ICD-10-CM | POA: Diagnosis present

## 2013-10-26 DIAGNOSIS — Z9079 Acquired absence of other genital organ(s): Secondary | ICD-10-CM

## 2013-10-26 DIAGNOSIS — Z9010 Acquired absence of unspecified breast and nipple: Secondary | ICD-10-CM

## 2013-10-26 DIAGNOSIS — K259 Gastric ulcer, unspecified as acute or chronic, without hemorrhage or perforation: Principal | ICD-10-CM | POA: Diagnosis present

## 2013-10-26 DIAGNOSIS — F329 Major depressive disorder, single episode, unspecified: Secondary | ICD-10-CM | POA: Diagnosis present

## 2013-10-26 DIAGNOSIS — D500 Iron deficiency anemia secondary to blood loss (chronic): Secondary | ICD-10-CM

## 2013-10-26 DIAGNOSIS — K625 Hemorrhage of anus and rectum: Secondary | ICD-10-CM

## 2013-10-26 DIAGNOSIS — I498 Other specified cardiac arrhythmias: Secondary | ICD-10-CM | POA: Diagnosis present

## 2013-10-26 DIAGNOSIS — I10 Essential (primary) hypertension: Secondary | ICD-10-CM | POA: Diagnosis present

## 2013-10-26 LAB — POC OCCULT BLOOD, ED: Fecal Occult Bld: POSITIVE — AB

## 2013-10-26 LAB — CBC WITH DIFFERENTIAL/PLATELET
BASOS ABS: 0 10*3/uL (ref 0.0–0.1)
Basophils Relative: 0 % (ref 0–1)
Eosinophils Absolute: 0.6 10*3/uL (ref 0.0–0.7)
Eosinophils Relative: 6 % — ABNORMAL HIGH (ref 0–5)
HCT: 17.4 % — ABNORMAL LOW (ref 36.0–46.0)
Hemoglobin: 5.6 g/dL — CL (ref 12.0–15.0)
LYMPHS ABS: 0.9 10*3/uL (ref 0.7–4.0)
Lymphocytes Relative: 9 % — ABNORMAL LOW (ref 12–46)
MCH: 34.8 pg — ABNORMAL HIGH (ref 26.0–34.0)
MCHC: 32.2 g/dL (ref 30.0–36.0)
MCV: 108.1 fL — ABNORMAL HIGH (ref 78.0–100.0)
Monocytes Absolute: 1.4 10*3/uL — ABNORMAL HIGH (ref 0.1–1.0)
Monocytes Relative: 14 % — ABNORMAL HIGH (ref 3–12)
Neutro Abs: 7 10*3/uL (ref 1.7–7.7)
Neutrophils Relative %: 70 % (ref 43–77)
PLATELETS: 527 10*3/uL — AB (ref 150–400)
RBC: 1.61 MIL/uL — AB (ref 3.87–5.11)
RDW: 16.7 % — AB (ref 11.5–15.5)
WBC: 9.9 10*3/uL (ref 4.0–10.5)

## 2013-10-26 LAB — BASIC METABOLIC PANEL
ANION GAP: 17 — AB (ref 5–15)
BUN: 11 mg/dL (ref 6–23)
CO2: 19 mEq/L (ref 19–32)
Calcium: 8.4 mg/dL (ref 8.4–10.5)
Chloride: 100 mEq/L (ref 96–112)
Creatinine, Ser: 1.51 mg/dL — ABNORMAL HIGH (ref 0.50–1.10)
GFR calc Af Amer: 44 mL/min — ABNORMAL LOW (ref >90)
GFR calc non Af Amer: 38 mL/min — ABNORMAL LOW (ref >90)
Glucose, Bld: 102 mg/dL — ABNORMAL HIGH (ref 70–99)
Potassium: 4.3 mEq/L (ref 3.7–5.3)
Sodium: 136 mEq/L — ABNORMAL LOW (ref 137–147)

## 2013-10-26 LAB — URINALYSIS, ROUTINE W REFLEX MICROSCOPIC
Bilirubin Urine: NEGATIVE
Glucose, UA: NEGATIVE mg/dL
Ketones, ur: NEGATIVE mg/dL
Nitrite: NEGATIVE
PH: 5.5 (ref 5.0–8.0)
Protein, ur: 30 mg/dL — AB
Specific Gravity, Urine: 1.008 (ref 1.005–1.030)
Urobilinogen, UA: 0.2 mg/dL (ref 0.0–1.0)

## 2013-10-26 LAB — URINE MICROSCOPIC-ADD ON

## 2013-10-26 LAB — PREPARE RBC (CROSSMATCH)

## 2013-10-26 MED ORDER — HYDROMORPHONE HCL PF 1 MG/ML IJ SOLN
1.0000 mg | Freq: Once | INTRAMUSCULAR | Status: AC
  Administered 2013-10-26: 1 mg via INTRAVENOUS
  Filled 2013-10-26: qty 1

## 2013-10-26 MED ORDER — SODIUM CHLORIDE 0.9 % IV SOLN: Freq: Once | INTRAVENOUS | Status: AC

## 2013-10-26 MED ORDER — IOHEXOL 300 MG/ML  SOLN
50.0000 mL | Freq: Once | INTRAMUSCULAR | Status: AC | PRN
  Administered 2013-10-26: 50 mL via ORAL

## 2013-10-26 MED ORDER — SODIUM CHLORIDE 0.9 % IV BOLUS (SEPSIS)
500.0000 mL | Freq: Once | INTRAVENOUS | Status: AC
  Administered 2013-10-27: 500 mL via INTRAVENOUS

## 2013-10-26 MED ORDER — IOHEXOL 300 MG/ML  SOLN: 100.0000 mL | Freq: Once | INTRAMUSCULAR | Status: AC | PRN

## 2013-10-26 MED ORDER — SODIUM CHLORIDE 0.9 % IV SOLN
INTRAVENOUS | Status: DC
  Administered 2013-10-27 (×2): via INTRAVENOUS

## 2013-10-26 MED ORDER — ACETYLCYSTEINE 20 % IN SOLN
600.0000 mg | Freq: Once | RESPIRATORY_TRACT | Status: AC
  Administered 2013-10-27: 600 mg via ORAL
  Filled 2013-10-26: qty 4

## 2013-10-26 MED ORDER — MORPHINE SULFATE 4 MG/ML IJ SOLN
4.0000 mg | Freq: Once | INTRAMUSCULAR | Status: AC
  Administered 2013-10-26: 4 mg via INTRAVENOUS
  Filled 2013-10-26: qty 1

## 2013-10-26 MED ORDER — ACETYLCYSTEINE 20 % IN SOLN
600.0000 mg | Freq: Two times a day (BID) | RESPIRATORY_TRACT | Status: DC
  Administered 2013-10-27 – 2013-10-28 (×2): 600 mg via ORAL
  Filled 2013-10-26 (×7): qty 4

## 2013-10-26 MED ORDER — ONDANSETRON HCL 4 MG/2ML IJ SOLN
4.0000 mg | Freq: Once | INTRAMUSCULAR | Status: AC
  Administered 2013-10-26: 4 mg via INTRAVENOUS
  Filled 2013-10-26: qty 2

## 2013-10-26 MED ORDER — SODIUM CHLORIDE 0.9 % IV SOLN
10.0000 mL/h | Freq: Once | INTRAVENOUS | Status: AC
  Administered 2013-10-26: 10 mL/h via INTRAVENOUS

## 2013-10-26 NOTE — ED Notes (Signed)
Patient transported to CT 

## 2013-10-26 NOTE — ED Notes (Signed)
Per EMS, pt from [facility] office. Pt had hysterectomy a week ago.  Pt went to MD today with c/o nausea and dizziness.  No fever.  Pt slight hypotensive.  Vitals  242/68, (86 systolic in Dr office).  Hr 120, resp 18, ST on EKG in Route,  IV 22 g in R hand,  4 mg zofran given in route.

## 2013-10-26 NOTE — Consult Note (Addendum)
Hospitalist Consult Note   Patient name: Brittany Blair Medical record number: [id number] Date of birth: 1960/07/09 Age: 53 y.o. Gender: female  Primary Care Provider: No PCP Per Patient  Chief Complaint: syncope, abdominal pain, ABLA, AKI History of Present Illness:This is a 53 y.o. year old female with significant past medical history of breast cancer s/p lumpectomy, radiation and chemotherapy, post menopausal bleeding, HTN presenting with syncope, abdominal pain, ABLA, AKI. Pt is noted to have had laprascopic assisted vaginal hysterectomy, bilateral oopherectomy, and lysis of adhesions on 10/14/2013. Pt states that she was discharged from the hospital the following day and since this point has had progressive weakness, dizziness, orthostasis and syncope. Pt states that she has had recurrent episodes of LOC and progressive weakness. States that she stopped taking her BP meds in the middle of the week and has still been symtpomatic. Symptoms have progressively worsened. Has also had nausea, abdominal pain. Denies any bleeding vaginally or from laproscopic sites. Denies any melena or BRBPR. Denies any hx/o GI bleeding in the past. Has been taking NSAIDs intermittently for pain in conjunction with vicodin. Pt went to see her gynecologist Dr. Raphael Gibney about sxs. Was noted to be anemic and orthostatic and was directed to the ER for further evaluation. In the ER, afebrile. HR in the 120s primarily. BP 100s-130s. Satting 95% on RA. Hgb 5.6, Cr 1.5, BUN 11. Noted to be hemoccult positive.     Assessment and Plan: Brittany Blair is a 53 y.o. year old female presenting with syncope, abdominal pain, ABLA, AKI   Active Problems:   Acute blood loss anemia   Abdominal pain   AKI (acute kidney injury)   -ABLA Fairly broad ddx for sxs inlcuding GI and postoperative sources of sxs. Given abd pain on exam and onset of sxs postoperatively, pt would benefit from CT abd and pelvis to assess for any  intra-abdominal bleeding given recent transvaginal/intra-abdominal procedure. Pt does have risk factors for GI source given recent NSAID use in setting of hemoccult positive stools, though BUN is WNL.  Current plan will be to check CT of abd and pelvis. If blood present in the abd, pt would benefit from surgical/gynecologic admission. Type and cross in the interim. Transfuse 1 unit pRBCs. If CT benign, admit to hospitalist service for inpt evaluation. Would also need GI consult.   -AKI -likely secondary to above  -continue IV hydration and pRBC transfusion -hold NSAIDs  -mucomyst and IVF prior to CT    Patient Active Problem List   Diagnosis Date Noted  . Post-menopausal bleeding 10/14/2013  . PMB (postmenopausal bleeding) 06/23/2013  . Genital herpes 08/04/2012  . Syncope 04/28/2012  . GAD (generalized anxiety disorder) 04/28/2012  . HTN (hypertension), benign 04/28/2012  . Chronic chest pain 04/28/2012  . Depression 04/28/2012  . Transaminitis 04/28/2012  . Breast Cancer, IDC,Left, central, Stage III, receptor + Her2 - 05/28/2010    Class: Stage 3   Past Medical History: Past Medical History  Diagnosis Date  . Anxiety   . Herpes   . MRSA (methicillin resistant Staphylococcus aureus)   . Breast lump     left  . Hypertension   . Hematoma   . Swelling     left foot  . Multiple blisters     along surgical site  . Breast cancer   . Depression   . Genital herpes 08/04/2012  . PMB (postmenopausal bleeding) 06/23/2013  . Lymph edema   . Vaginal delivery 1981, 1984, 1987, 1992  Past Surgical History: Past Surgical History  Procedure Laterality Date  . Tubal ligation    . Wisdom tooth extraction    . Mastectomy modified radical  10/10/10     left -Dr Margot Chimes  . Laparoscopic assisted vaginal hysterectomy Bilateral 10/14/2013    Procedure: LAPAROSCOPIC ASSISTED VAGINAL HYSTERECTOMY;  Surgeon: Ena Dawley, MD;  Location: [facility];  Service: Gynecology;  Laterality:  Bilateral;  . Cystoscopy N/A 10/14/2013    Procedure: CYSTOSCOPY;  Surgeon: Ena Dawley, MD;  Location: [facility];  Service: Gynecology;  Laterality: N/A;  . Laparoscopic lysis of adhesions N/A 10/14/2013    Procedure: LAPAROSCOPIC LYSIS OF ADHESIONS;  Surgeon: Ena Dawley, MD;  Location: [facility];  Service: Gynecology;  Laterality: N/A;  . Laparoscopic bilateral salpingo oopherectomy Bilateral 10/14/2013    Procedure: LAPAROSCOPIC BILATERAL SALPINGO OOPHORECTOMY;  Surgeon: Ena Dawley, MD;  Location: [facility];  Service: Gynecology;  Laterality: Bilateral;    Social History: History   Social History  . Marital Status: Married    Spouse Name: N/A    Number of Children: N/A  . Years of Education: N/A   Social History Main Topics  . Smoking status: Former Smoker -- 0.35 packs/day    Types: Cigarettes    Quit date: 09/26/2013  . Smokeless tobacco: Never Used  . Alcohol Use: 12.6 oz/week    21 Glasses of wine per week  . Drug Use: No  . Sexual Activity: Not Currently    Birth Control/ Protection: None   Other Topics Concern  . None   Social History Narrative  . None    Family History: Family History  Problem Relation Age of Onset  . Heart disease Father     heart attack  . Hypertension Daughter   . Hypertension Son   . Cancer Maternal Grandmother     ovarian  . Hypertension Maternal Grandfather   . Cancer Paternal Grandmother     ovarian  . Hypertension Paternal Grandfather     Allergies: Allergies  Allergen Reactions  . Bee Venom Anaphylaxis    No current facility-administered medications for this encounter.   Current Outpatient Prescriptions  Medication Sig Dispense Refill  . acyclovir (ZOVIRAX) 400 MG tablet Take 400 mg by mouth daily.      Marland Kitchen ALPRAZolam (XANAX) 0.5 MG tablet Take 1 tablet (0.5 mg total) by mouth 3 (three) times daily as needed for anxiety.  90 tablet  3  . clotrimazole (LOTRIMIN) 1 % cream Apply 1 application topically 2 (two) times  daily as needed (yeast rash).      . diphenhydrAMINE (BENADRYL) 25 MG tablet Take 25 mg by mouth every 6 (six) hours as needed for allergies.      . ferrous sulfate (FERROUSUL) 325 (65 FE) MG tablet Take 1 tablet (325 mg total) by mouth 2 (two) times daily with a meal.  100 tablet  1  . HYDROcodone-acetaminophen (NORCO/VICODIN) 5-325 MG per tablet Take 1 tablet by mouth every 4 (four) hours as needed (pain).  30 tablet  0  . ibuprofen (ADVIL,MOTRIN) 800 MG tablet Take 800 mg by mouth every 8 (eight) hours as needed for moderate pain.      Marland Kitchen lisinopril (PRINIVIL,ZESTRIL) 40 MG tablet Take 40 mg by mouth daily.      Marland Kitchen loratadine (CLARITIN) 10 MG tablet Take 10 mg by mouth daily as needed for allergies.       . metoprolol succinate (TOPROL-XL) 25 MG 24 hr tablet Take 25 mg by mouth daily.      Marland Kitchen  promethazine (PHENERGAN) 12.5 MG tablet Take 1 tablet (12.5 mg total) by mouth every 6 (six) hours as needed for nausea or vomiting.  30 tablet  0  . tetrahydrozoline-zinc (VISINE-AC) 0.05-0.25 % ophthalmic solution Place 2 drops into both eyes 3 (three) times daily as needed (Red Eyes).      . traZODone (DESYREL) 50 MG tablet Take 1 tablet (50 mg total) by mouth at bedtime.  30 tablet  6  . venlafaxine XR (EFFEXOR-XR) 75 MG 24 hr capsule Take 225 mg by mouth every evening.      . [DISCONTINUED] FLUoxetine (PROZAC) 20 MG tablet Take 40 mg by mouth daily.         Review Of Systems: 12 point ROS negative except as noted above in HPI.  Physical Exam: Filed Vitals:   10/26/13 2230  BP: 130/85  Pulse: 127  Temp:   Resp: 20    General: alert and cooperative HEENT: PERRLA and extra ocular movement intact Heart: tachycardic, no murmurs Lungs: clear to auscultation, no wheezes or rales and unlabored breathing Abdomen: mildly obese abdomen, + TTP in lower abdomen, + bowel sounds  Extremities: extremities normal, atraumatic, no cyanosis or edema Skin:generalized pallor  Neurology: normal without focal  findings  Labs and Imaging: Lab Results  Component Value Date/Time   NA 136* 10/26/2013  6:54 PM   K 4.3 10/26/2013  6:54 PM   CL 100 10/26/2013  6:54 PM   CO2 19 10/26/2013  6:54 PM   BUN 11 10/26/2013  6:54 PM   CREATININE 1.51* 10/26/2013  6:54 PM   GLUCOSE 102* 10/26/2013  6:54 PM   Lab Results  Component Value Date   WBC 9.9 10/26/2013   HGB 5.6* 10/26/2013   HCT 17.4* 10/26/2013   MCV 108.1* 10/26/2013   PLT 527* 10/26/2013   Urinalysis    Component Value Date/Time   COLORURINE YELLOW 10/26/2013 1942   APPEARANCEUR CLOUDY* 10/26/2013 1942   LABSPEC 1.008 10/26/2013 1942   PHURINE 5.5 10/26/2013 1942   GLUCOSEU NEGATIVE 10/26/2013 1942   HGBUR SMALL* 10/26/2013 1942   BILIRUBINUR NEGATIVE 10/26/2013 1942   KETONESUR NEGATIVE 10/26/2013 1942   PROTEINUR 30* 10/26/2013 1942   UROBILINOGEN 0.2 10/26/2013 1942   NITRITE NEGATIVE 10/26/2013 1942   LEUKOCYTESUR MODERATE* 10/26/2013 1942      No results found.         Shanda Howells MD  Pager: [phone]

## 2013-10-26 NOTE — ED Notes (Signed)
Pt states had a hysterectomy on 8/21, states she was discharged home when she couldn't walk, states since Friday has had nausea and dizziness, states dizziness is there even when laying, pt denies any vaginal bleeding or increased abdominal pain.

## 2013-10-26 NOTE — ED Provider Notes (Addendum)
CSN: [id number]     Arrival date & time 10/26/13  1824 History   First MD Initiated Contact with Patient 10/26/13 1935     Chief Complaint  Patient presents with  . Dizziness  . Nausea     (Consider location/radiation/quality/duration/timing/severity/associated sxs/prior Treatment) HPI  Brittany Blair is a 53 y.o. female who is here for evaluation of "fainting spells, nausea, and dizziness." She states that they started immediately after her vaginal hysterectomy on 10/14/2013. She denies fever, chills, cough, shortness of breath, or chest pain. She has not had any visible blood loss. She has been taking iron pills, so her stool is dark in color. She denies hematemesis, rectal bleeding, or vaginal bleeding, hematuria, or coughing blood. She's never had this problem previously. Her hysterectomy was for persistent bleeding. She is status post breast cancer, with radiation and chemotherapy treatments.   Past Medical History  Diagnosis Date  . Anxiety   . Herpes   . MRSA (methicillin resistant Staphylococcus aureus)   . Breast lump     left  . Hypertension   . Hematoma   . Swelling     left foot  . Multiple blisters     along surgical site  . Depression   . Genital herpes 08/04/2012  . PMB (postmenopausal bleeding) 06/23/2013  . Lymph edema   . Vaginal delivery 1981, 1984, 1987, 1992  . Breast cancer dx'd 05/27/2010    chemo/xrt comp 01/2011   Past Surgical History  Procedure Laterality Date  . Tubal ligation    . Wisdom tooth extraction    . Mastectomy modified radical  10/10/10     left -Dr Margot Chimes  . Laparoscopic assisted vaginal hysterectomy Bilateral 10/14/2013    Procedure: LAPAROSCOPIC ASSISTED VAGINAL HYSTERECTOMY;  Surgeon: Ena Dawley, MD;  Location: [facility];  Service: Gynecology;  Laterality: Bilateral;  . Cystoscopy N/A 10/14/2013    Procedure: CYSTOSCOPY;  Surgeon: Ena Dawley, MD;  Location: [facility];  Service: Gynecology;  Laterality: N/A;  . Laparoscopic lysis of  adhesions N/A 10/14/2013    Procedure: LAPAROSCOPIC LYSIS OF ADHESIONS;  Surgeon: Ena Dawley, MD;  Location: [facility];  Service: Gynecology;  Laterality: N/A;  . Laparoscopic bilateral salpingo oopherectomy Bilateral 10/14/2013    Procedure: LAPAROSCOPIC BILATERAL SALPINGO OOPHORECTOMY;  Surgeon: Ena Dawley, MD;  Location: [facility];  Service: Gynecology;  Laterality: Bilateral;  . Esophagogastroduodenoscopy N/A 10/27/2013    Procedure: ESOPHAGOGASTRODUODENOSCOPY (EGD);  Surgeon: Wonda Horner, MD;  Location: [facility];  Service: Endoscopy;  Laterality: N/A;   Family History  Problem Relation Age of Onset  . Heart disease Father     heart attack  . Hypertension Daughter   . Hypertension Son   . Cancer Maternal Grandmother     ovarian  . Hypertension Maternal Grandfather   . Cancer Paternal Grandmother     ovarian  . Hypertension Paternal Grandfather    History  Substance Use Topics  . Smoking status: Former Smoker -- 0.35 packs/day    Types: Cigarettes    Quit date: 09/26/2013  . Smokeless tobacco: Never Used  . Alcohol Use: 12.6 oz/week    21 Glasses of wine per week   OB History   Grav Para Term Preterm Abortions TAB SAB Ect Mult Living   4 4        4      Review of Systems  All other systems reviewed and are negative.     Allergies  Bee venom  Home Medications   Prior to Admission  medications   Medication Sig Start Date End Date Taking? Authorizing Provider  acyclovir (ZOVIRAX) 400 MG tablet Take 400 mg by mouth daily.   Yes Historical Provider, MD  ALPRAZolam Duanne Moron) 0.5 MG tablet Take 1 tablet (0.5 mg total) by mouth 3 (three) times daily as needed for anxiety. 08/05/13  Yes Baird Cancer, PA-C  clotrimazole (LOTRIMIN) 1 % cream Apply 1 application topically 2 (two) times daily as needed (yeast rash).   Yes Historical Provider, MD  HYDROcodone-acetaminophen (NORCO/VICODIN) 5-325 MG per tablet Take 1 tablet by mouth every 4 (four) hours as needed (pain).  10/15/13  Yes Ena Dawley, MD  lisinopril (PRINIVIL,ZESTRIL) 40 MG tablet Take 40 mg by mouth daily.   Yes Historical Provider, MD  loratadine (CLARITIN) 10 MG tablet Take 10 mg by mouth daily as needed for allergies.    Yes Historical Provider, MD  metoprolol succinate (TOPROL-XL) 25 MG 24 hr tablet Take 25 mg by mouth daily.   Yes Historical Provider, MD  promethazine (PHENERGAN) 12.5 MG tablet Take 1 tablet (12.5 mg total) by mouth every 6 (six) hours as needed for nausea or vomiting. 10/15/13  Yes Ena Dawley, MD  tetrahydrozoline-zinc (VISINE-AC) 0.05-0.25 % ophthalmic solution Place 2 drops into both eyes 3 (three) times daily as needed (Red Eyes).   Yes Historical Provider, MD  traZODone (DESYREL) 50 MG tablet Take 1 tablet (50 mg total) by mouth at bedtime. 08/24/13  Yes Farrel Gobble, MD  venlafaxine XR (EFFEXOR-XR) 75 MG 24 hr capsule Take 225 mg by mouth every evening.   Yes Historical Provider, MD  amLODipine (NORVASC) 5 MG tablet Take 5 mg by mouth daily.    Historical Provider, MD  pantoprazole (PROTONIX) 40 MG tablet Take 1 tablet (40 mg total) by mouth 2 (two) times daily before a meal. 10/28/13   Modena Jansky, MD  Polyethylene Glycol 3350 (MIRALAX PO) Take 30 mLs by mouth daily.    Historical Provider, MD   BP 118/61  Pulse 98  Temp(Src) 98.2 F (36.8 C) (Oral)  Resp 18  Ht 5\' 4"  (1.626 m)  Wt 197 lb 1.5 oz (89.4 kg)  BMI 33.81 kg/m2  SpO2 99%  LMP 05/29/2013 Physical Exam  Nursing note and vitals reviewed. Constitutional: She is oriented to person, place, and time. She appears well-developed and well-nourished.  HENT:  Head: Normocephalic and atraumatic.  Eyes: Conjunctivae and EOM are normal. Pupils are equal, round, and reactive to light.  Pale conjunctiva  Neck: Normal range of motion and phonation normal. Neck supple.  Cardiovascular: Normal rate and regular rhythm.   Pulmonary/Chest: Effort normal and breath sounds normal. She exhibits no tenderness.   Abdominal: Soft. She exhibits no distension and no mass. There is no tenderness. There is no guarding.  Genitourinary:  Rectal exam, dark stool present  Musculoskeletal: Normal range of motion.  Neurological: She is alert and oriented to person, place, and time. She exhibits normal muscle tone.  Skin: Skin is warm and dry.  Psychiatric: She has a normal mood and affect. Her behavior is normal. Judgment and thought content normal.    ED Course  Procedures (including critical care time)  Medications  iohexol (OMNIPAQUE) 300 MG/ML solution 100 mL (not administered)  0.9 %  sodium chloride infusion ( Intravenous New Bag/Given 10/28/13 0222)  0.9 %  sodium chloride infusion (0 mL/hr Intravenous Stopped 10/26/13 2341)  morphine 4 MG/ML injection 4 mg (4 mg Intravenous Given 10/26/13 2050)  ondansetron (ZOFRAN) injection 4 mg (4 mg  Intravenous Given 10/26/13 2050)  HYDROmorphone (DILAUDID) injection 1 mg (1 mg Intravenous Given 10/26/13 2150)  iohexol (OMNIPAQUE) 300 MG/ML solution 50 mL (50 mLs Oral Contrast Given 10/26/13 2303)  0.9 %  sodium chloride infusion ( Intravenous Duplicate 08/25/04 2694)  acetylcysteine (MUCOMYST) 20 % nebulizer / oral solution 600 mg (600 mg Oral Given 10/27/13 0117)  sodium chloride 0.9 % bolus 500 mL (0 mLs Intravenous Stopped 10/27/13 0110)  iohexol (OMNIPAQUE) 300 MG/ML solution 100 mL (100 mLs Intravenous Contrast Given 10/27/13 0005)    No data found.      10:00 AM- case discussed with Dr. Raphael Gibney, gynecology, he does not feel that there is an acute gynecologic abnormality, nor that the anemia, with blood loss, is related to a GYN process. He is available is needed for consultation.   10:04  PM-Consult complete with Dr Ernestina Patches. Patient case explained and discussed. He agrees to admit patient for further evaluation and treatment. Call ended at 10:15 PM  CRITICAL CARE Performed by: Richarda Blade Total critical care time: 35 minutes Critical care time was exclusive  of separately billable procedures and treating other patients. Critical care was necessary to treat or prevent imminent or life-threatening deterioration. Critical care was time spent personally by me on the following activities: development of treatment plan with patient and/or surrogate as well as nursing, discussions with consultants, evaluation of patient's response to treatment, examination of patient, obtaining history from patient or surrogate, ordering and performing treatments and interventions, ordering and review of laboratory studies, ordering and review of radiographic studies, pulse oximetry and re-evaluation of patient's condition. Labs Review Labs Reviewed  CBC WITH DIFFERENTIAL - Abnormal; Notable for the following:    RBC 1.61 (*)    Hemoglobin 5.6 (*)    HCT 17.4 (*)    MCV 108.1 (*)    MCH 34.8 (*)    RDW 16.7 (*)    Platelets 527 (*)    Lymphocytes Relative 9 (*)    Monocytes Relative 14 (*)    Monocytes Absolute 1.4 (*)    Eosinophils Relative 6 (*)    All other components within normal limits  BASIC METABOLIC PANEL - Abnormal; Notable for the following:    Sodium 136 (*)    Glucose, Bld 102 (*)    Creatinine, Ser 1.51 (*)    GFR calc non Af Amer 38 (*)    GFR calc Af Amer 44 (*)    Anion gap 17 (*)    All other components within normal limits  URINALYSIS, ROUTINE W REFLEX MICROSCOPIC - Abnormal; Notable for the following:    APPearance CLOUDY (*)    Hgb urine dipstick SMALL (*)    Protein, ur 30 (*)    Leukocytes, UA MODERATE (*)    All other components within normal limits  URINE MICROSCOPIC-ADD ON - Abnormal; Notable for the following:    Squamous Epithelial / LPF FEW (*)    Bacteria, UA FEW (*)    All other components within normal limits  COMPREHENSIVE METABOLIC PANEL - Abnormal; Notable for the following:    Creatinine, Ser 1.34 (*)    Calcium 8.1 (*)    Albumin 3.1 (*)    AST 58 (*)    Alkaline Phosphatase 150 (*)    GFR calc non Af Amer 44 (*)     GFR calc Af Amer 51 (*)    Anion gap 18 (*)    All other components within normal limits  CBC WITH DIFFERENTIAL - Abnormal;  Notable for the following:    RBC 2.99 (*)    Hemoglobin 9.7 (*)    HCT 29.4 (*)    RDW 20.5 (*)    Monocytes Relative 14 (*)    Eosinophils Relative 7 (*)    All other components within normal limits  CBC WITH DIFFERENTIAL - Abnormal; Notable for the following:    RBC 2.61 (*)    Hemoglobin 8.5 (*)    HCT 25.2 (*)    RDW 21.1 (*)    Platelets 439 (*)    Monocytes Relative 17 (*)    Eosinophils Relative 9 (*)    All other components within normal limits  FERRITIN - Abnormal; Notable for the following:    Ferritin 389 (*)    All other components within normal limits  RETICULOCYTES - Abnormal; Notable for the following:    Retic Ct Pct 12.7 (*)    RBC. 3.00 (*)    Retic Count, Manual 381.0 (*)    All other components within normal limits  CBC WITH DIFFERENTIAL - Abnormal; Notable for the following:    RBC 2.58 (*)    Hemoglobin 8.3 (*)    HCT 25.4 (*)    RDW 20.8 (*)    Platelets 452 (*)    Monocytes Relative 15 (*)    Eosinophils Relative 8 (*)    All other components within normal limits  COMPREHENSIVE METABOLIC PANEL - Abnormal; Notable for the following:    CO2 18 (*)    BUN 5 (*)    Calcium 7.5 (*)    Total Protein 5.6 (*)    Albumin 2.6 (*)    AST 79 (*)    Alkaline Phosphatase 121 (*)    GFR calc non Af Amer 70 (*)    GFR calc Af Amer 81 (*)    All other components within normal limits  POC OCCULT BLOOD, ED - Abnormal; Notable for the following:    Fecal Occult Bld POSITIVE (*)    All other components within normal limits  URINE CULTURE  VITAMIN B12  FOLATE  IRON AND TIBC  TYPE AND SCREEN  PREPARE RBC (CROSSMATCH)  ABO/RH  PREPARE RBC (CROSSMATCH)  SURGICAL PATHOLOGY    Imaging Review No results found.   EKG Interpretation   Date/Time:  Tuesday October 26 2013 19:07:42 EDT Ventricular Rate:  120 PR Interval:   135 QRS Duration: 65 QT Interval:  309 QTC Calculation: 436 R Axis:   33 Text Interpretation:  Sinus tachycardia Atrial premature complex  Borderline T abnormalities, diffuse leads Artifact Since last tracing rate  faster Serial tracing suggested Confirmed by Eulis Foster  MD, Vira Agar ([id number]) on  10/26/2013 7:37:55 PM      MDM   Final diagnoses:  Iron deficiency anemia due to chronic blood loss  Rectal bleeding    Weakness, with shaking spells are likely due to blood loss anemia, most likely from the intestinal source. This is more likely lower GI bleed with a normal BUN. Patient is to colonoscopy. Doubt that this is related to her recent vaginal hysterectomy. There is incidental abnormal urinalysis, without urinary tract symptoms. Urinary cultures were ordered.  Nursing Notes Reviewed/ Care Coordinated Applicable Imaging Reviewed Interpretation of Laboratory Data incorporated into ED treatment   Plan: Admit    Richarda Blade, MD 10/26/13 [place], MD 11/10/13 [phone]

## 2013-10-26 NOTE — ED Notes (Signed)
Critical lab Hgb 5.6.  Acknowledge by Mendel Ryder RN

## 2013-10-27 ENCOUNTER — Encounter (HOSPITAL_COMMUNITY): Payer: Self-pay | Admitting: *Deleted

## 2013-10-27 ENCOUNTER — Encounter (HOSPITAL_COMMUNITY)
Admission: EM | Disposition: A | Discharge status: Home / Self Care | Payer: Self-pay | Source: Home / Self Care | Attending: Internal Medicine

## 2013-10-27 DIAGNOSIS — K922 Gastrointestinal hemorrhage, unspecified: Secondary | ICD-10-CM | POA: Diagnosis present

## 2013-10-27 DIAGNOSIS — D62 Acute posthemorrhagic anemia: Secondary | ICD-10-CM

## 2013-10-27 DIAGNOSIS — N179 Acute kidney failure, unspecified: Secondary | ICD-10-CM

## 2013-10-27 DIAGNOSIS — R55 Syncope and collapse: Secondary | ICD-10-CM | POA: Diagnosis present

## 2013-10-27 DIAGNOSIS — D649 Anemia, unspecified: Secondary | ICD-10-CM | POA: Diagnosis present

## 2013-10-27 DIAGNOSIS — K259 Gastric ulcer, unspecified as acute or chronic, without hemorrhage or perforation: Principal | ICD-10-CM

## 2013-10-27 HISTORY — PX: ESOPHAGOGASTRODUODENOSCOPY: SHX5428

## 2013-10-27 LAB — COMPREHENSIVE METABOLIC PANEL
ALK PHOS: 150 U/L — AB (ref 39–117)
ALT: 34 U/L (ref 0–35)
ANION GAP: 18 — AB (ref 5–15)
AST: 58 U/L — ABNORMAL HIGH (ref 0–37)
Albumin: 3.1 g/dL — ABNORMAL LOW (ref 3.5–5.2)
BILIRUBIN TOTAL: 0.7 mg/dL (ref 0.3–1.2)
BUN: 9 mg/dL (ref 6–23)
CO2: 19 meq/L (ref 19–32)
Calcium: 8.1 mg/dL — ABNORMAL LOW (ref 8.4–10.5)
Chloride: 102 mEq/L (ref 96–112)
Creatinine, Ser: 1.34 mg/dL — ABNORMAL HIGH (ref 0.50–1.10)
GFR, EST AFRICAN AMERICAN: 51 mL/min — AB (ref >90)
GFR, EST NON AFRICAN AMERICAN: 44 mL/min — AB (ref >90)
Glucose, Bld: 83 mg/dL (ref 70–99)
POTASSIUM: 3.9 meq/L (ref 3.7–5.3)
SODIUM: 139 meq/L (ref 137–147)
Total Protein: 6.3 g/dL (ref 6.0–8.3)

## 2013-10-27 LAB — CBC WITH DIFFERENTIAL/PLATELET
BASOS ABS: 0 10*3/uL (ref 0.0–0.1)
Basophils Absolute: 0.1 10*3/uL (ref 0.0–0.1)
Basophils Relative: 0 % (ref 0–1)
Basophils Relative: 1 % (ref 0–1)
EOS ABS: 0.5 10*3/uL (ref 0.0–0.7)
EOS ABS: 0.5 10*3/uL (ref 0.0–0.7)
Eosinophils Relative: 7 % — ABNORMAL HIGH (ref 0–5)
Eosinophils Relative: 9 % — ABNORMAL HIGH (ref 0–5)
HCT: 29.4 % — ABNORMAL LOW (ref 36.0–46.0)
HCT: 25.2 % — ABNORMAL LOW (ref 36.0–46.0)
HEMOGLOBIN: 8.5 g/dL — AB (ref 12.0–15.0)
Hemoglobin: 9.7 g/dL — ABNORMAL LOW (ref 12.0–15.0)
LYMPHS PCT: 24 % (ref 12–46)
Lymphocytes Relative: 17 % (ref 12–46)
Lymphs Abs: 1.6 10*3/uL (ref 0.7–4.0)
Lymphs Abs: 1 10*3/uL (ref 0.7–4.0)
MCH: 32.4 pg (ref 26.0–34.0)
MCH: 32.6 pg (ref 26.0–34.0)
MCHC: 33 g/dL (ref 30.0–36.0)
MCHC: 33.7 g/dL (ref 30.0–36.0)
MCV: 98.3 fL (ref 78.0–100.0)
MCV: 96.6 fL (ref 78.0–100.0)
MONO ABS: 0.9 10*3/uL (ref 0.1–1.0)
MONO ABS: 1 10*3/uL (ref 0.1–1.0)
Monocytes Relative: 14 % — ABNORMAL HIGH (ref 3–12)
Monocytes Relative: 17 % — ABNORMAL HIGH (ref 3–12)
NEUTROS PCT: 56 % (ref 43–77)
Neutro Abs: 3.6 10*3/uL (ref 1.7–7.7)
Neutro Abs: 3.5 10*3/uL (ref 1.7–7.7)
Neutrophils Relative %: 55 % (ref 43–77)
Platelets: ADEQUATE 10*3/uL (ref 150–400)
Platelets: 439 10*3/uL — ABNORMAL HIGH (ref 150–400)
RBC: 2.99 MIL/uL — ABNORMAL LOW (ref 3.87–5.11)
RBC: 2.61 MIL/uL — ABNORMAL LOW (ref 3.87–5.11)
RDW: 20.5 % — AB (ref 11.5–15.5)
RDW: 21.1 % — AB (ref 11.5–15.5)
WBC: 6.6 10*3/uL (ref 4.0–10.5)
WBC: 6.1 10*3/uL (ref 4.0–10.5)

## 2013-10-27 LAB — FOLATE: Folate: 10 ng/mL

## 2013-10-27 LAB — IRON AND TIBC
Iron: 109 ug/dL (ref 42–135)
Saturation Ratios: 44 % (ref 20–55)
TIBC: 250 ug/dL (ref 250–470)
UIBC: 141 ug/dL (ref 125–400)

## 2013-10-27 LAB — FERRITIN: Ferritin: 389 ng/mL — ABNORMAL HIGH (ref 10–291)

## 2013-10-27 LAB — VITAMIN B12: VITAMIN B 12: 637 pg/mL (ref 211–911)

## 2013-10-27 LAB — URINE CULTURE
CULTURE: NO GROWTH
Colony Count: NO GROWTH
Special Requests: NORMAL

## 2013-10-27 LAB — RETICULOCYTES
RBC.: 3 MIL/uL — AB (ref 3.87–5.11)
RETIC CT PCT: 12.7 % — AB (ref 0.4–3.1)
Retic Count, Absolute: 381 10*3/uL — ABNORMAL HIGH (ref 19.0–186.0)

## 2013-10-27 LAB — ABO/RH: ABO/RH(D): O POS

## 2013-10-27 LAB — PREPARE RBC (CROSSMATCH)

## 2013-10-27 SURGERY — EGD (ESOPHAGOGASTRODUODENOSCOPY)
Anesthesia: Moderate Sedation

## 2013-10-27 MED ORDER — SODIUM CHLORIDE 0.9 % IV SOLN: INTRAVENOUS | Status: DC

## 2013-10-27 MED ORDER — SODIUM CHLORIDE 0.9 % IV SOLN
INTRAVENOUS | Status: AC
  Administered 2013-10-28: 02:00:00 via INTRAVENOUS

## 2013-10-27 MED ORDER — ACETAMINOPHEN 325 MG PO TABS: 650.0000 mg | ORAL_TABLET | Freq: Four times a day (QID) | ORAL | Status: DC | PRN

## 2013-10-27 MED ORDER — ACYCLOVIR 400 MG PO TABS
400.0000 mg | ORAL_TABLET | Freq: Every day | ORAL | Status: DC
  Administered 2013-10-27 – 2013-10-28 (×2): 400 mg via ORAL
  Filled 2013-10-27 (×2): qty 1

## 2013-10-27 MED ORDER — ALPRAZOLAM 0.5 MG PO TABS
0.5000 mg | ORAL_TABLET | Freq: Three times a day (TID) | ORAL | Status: DC | PRN
  Administered 2013-10-27: 0.5 mg via ORAL
  Filled 2013-10-27: qty 1

## 2013-10-27 MED ORDER — MORPHINE SULFATE 2 MG/ML IJ SOLN
1.0000 mg | INTRAMUSCULAR | Status: DC | PRN
  Administered 2013-10-27 – 2013-10-28 (×2): 1 mg via INTRAVENOUS
  Filled 2013-10-27 (×2): qty 1

## 2013-10-27 MED ORDER — BUTAMBEN-TETRACAINE-BENZOCAINE 2-2-14 % EX AERO
INHALATION_SPRAY | CUTANEOUS | Status: DC | PRN
  Administered 2013-10-27: 2 via TOPICAL

## 2013-10-27 MED ORDER — MORPHINE SULFATE 4 MG/ML IJ SOLN
4.0000 mg | INTRAMUSCULAR | Status: DC | PRN
  Administered 2013-10-27 (×3): 4 mg via INTRAVENOUS
  Filled 2013-10-27 (×3): qty 1

## 2013-10-27 MED ORDER — VENLAFAXINE HCL ER 75 MG PO CP24
225.0000 mg | ORAL_CAPSULE | Freq: Every evening | ORAL | Status: DC
  Administered 2013-10-27 – 2013-10-28 (×2): 225 mg via ORAL
  Filled 2013-10-27 (×2): qty 1

## 2013-10-27 MED ORDER — ONDANSETRON HCL 4 MG/2ML IJ SOLN
4.0000 mg | Freq: Four times a day (QID) | INTRAMUSCULAR | Status: DC | PRN
  Administered 2013-10-27: 4 mg via INTRAVENOUS
  Filled 2013-10-27: qty 2

## 2013-10-27 MED ORDER — FENTANYL CITRATE 0.05 MG/ML IJ SOLN
INTRAMUSCULAR | Status: DC | PRN
  Administered 2013-10-27 (×3): 25 ug via INTRAVENOUS

## 2013-10-27 MED ORDER — FENTANYL CITRATE 0.05 MG/ML IJ SOLN
INTRAMUSCULAR | Status: AC
  Filled 2013-10-27: qty 2

## 2013-10-27 MED ORDER — DIPHENHYDRAMINE HCL 50 MG/ML IJ SOLN
INTRAMUSCULAR | Status: DC | PRN
  Administered 2013-10-27: 25 mg via INTRAVENOUS

## 2013-10-27 MED ORDER — MIDAZOLAM HCL 10 MG/2ML IJ SOLN
INTRAMUSCULAR | Status: DC | PRN
  Administered 2013-10-27: 1 mg via INTRAVENOUS
  Administered 2013-10-27 (×3): 2 mg via INTRAVENOUS

## 2013-10-27 MED ORDER — SODIUM CHLORIDE 0.9 % IJ SOLN
3.0000 mL | Freq: Two times a day (BID) | INTRAMUSCULAR | Status: DC
  Administered 2013-10-27: 3 mL via INTRAVENOUS

## 2013-10-27 MED ORDER — DIPHENHYDRAMINE HCL 50 MG/ML IJ SOLN
INTRAMUSCULAR | Status: AC
  Filled 2013-10-27: qty 1

## 2013-10-27 MED ORDER — MIDAZOLAM HCL 10 MG/2ML IJ SOLN
INTRAMUSCULAR | Status: AC
  Filled 2013-10-27: qty 2

## 2013-10-27 MED ORDER — MORPHINE SULFATE 2 MG/ML IJ SOLN
1.0000 mg | INTRAMUSCULAR | Status: DC | PRN
  Administered 2013-10-27: 2 mg via INTRAVENOUS
  Filled 2013-10-27: qty 1

## 2013-10-27 MED ORDER — TRAZODONE HCL 50 MG PO TABS
50.0000 mg | ORAL_TABLET | Freq: Every day | ORAL | Status: DC
  Administered 2013-10-27: 50 mg via ORAL
  Filled 2013-10-27 (×2): qty 1

## 2013-10-27 MED ORDER — PANTOPRAZOLE SODIUM 40 MG IV SOLR
40.0000 mg | Freq: Two times a day (BID) | INTRAVENOUS | Status: DC
  Administered 2013-10-27 – 2013-10-28 (×4): 40 mg via INTRAVENOUS
  Filled 2013-10-27 (×5): qty 40

## 2013-10-27 MED ORDER — IOHEXOL 300 MG/ML  SOLN
100.0000 mL | Freq: Once | INTRAMUSCULAR | Status: AC | PRN
  Administered 2013-10-27: 100 mL via INTRAVENOUS

## 2013-10-27 NOTE — Op Note (Signed)
[facility] [place], [id number]   ENDOSCOPY PROCEDURE REPORT  PATIENT: Brittany, Blair  MR#: [id number] BIRTHDATE: [birth year] , 52  yrs. old GENDER: Female ENDOSCOPIST: Acquanetta Sit, MD REFERRED BY: PROCEDURE DATE:  10/27/2013 PROCEDURE:   EGD with biopsy ASA CLASS: 2 INDICATIONS: anemia, melena MEDICATIONS: fentanyl 75 mcg IV, Versed 7 mg IV, Benadryl 25 mg IV TOPICAL ANESTHETIC: Cetacaine spray  DESCRIPTION OF PROCEDURE:   After the risks benefits and alternatives of the procedure were thoroughly explained, informed consent was obtained.  The Pentax Gastroscope [id number]  endoscope was introduced through the mouth and advanced to the second portion of the duodenum      , limited by Without limitations.   The instrument was slowly withdrawn as the mucosa was fully examined.      FINDINGS:  Esophagus: Normal  Stomach: 1 cm prepyloric antral ulcer as seen in image 004 and image 005. Rim of ulcer biopsy. No visible vessels seen. No active bleeding.  Duodenum: Normal  COMPLICATIONS:none  ENDOSCOPIC IMPRESSION:1 cm antral ulcer   RECOMMENDATIONS:PPI therapy. Check biopsies. I believe this would explain her worsening anemia and melena.      _______________________________ Lorrin MaisAcquanetta Sit, MD 10/27/2013 2:33 PM

## 2013-10-27 NOTE — Consult Note (Signed)
Subjective:   HPI  The patient is a 53 year old female with a history of breast cancer status post lumpectomy radiation and chemotherapy in the past. She recently on August 20 also had a laparoscopic-assisted vaginal hysterectomy. She states it went well. She was discharged the following day after the surgery but when she went home started to have progressive weakness, dizziness and fainting spells. She had some nausea vomiting and dry heaves also. She came back to the emergency room and was found to have a hemoglobin of 5.6. 1 week earlier her hemoglobin was 10. She denies any postoperative vaginal bleeding from the hysterectomy. She was experiencing black-colored stools however states she was also taking iron. She denied hematemesis. She did go home ibuprofen 800 mg daily. She gives no history of peptic ulcer disease.  Review of Systems Denies chest pain or shortness of breath  Past Medical History  Diagnosis Date  . Anxiety   . Herpes   . MRSA (methicillin resistant Staphylococcus aureus)   . Breast lump     left  . Hypertension   . Hematoma   . Swelling     left foot  . Multiple blisters     along surgical site  . Depression   . Genital herpes 08/04/2012  . PMB (postmenopausal bleeding) 06/23/2013  . Lymph edema   . Vaginal delivery 1981, 1984, 1987, 1992  . Breast cancer dx'd 05/27/2010    chemo/xrt comp 01/2011   Past Surgical History  Procedure Laterality Date  . Tubal ligation    . Wisdom tooth extraction    . Mastectomy modified radical  10/10/10     left -Dr Margot Chimes  . Laparoscopic assisted vaginal hysterectomy Bilateral 10/14/2013    Procedure: LAPAROSCOPIC ASSISTED VAGINAL HYSTERECTOMY;  Surgeon: Ena Dawley, MD;  Location: [facility];  Service: Gynecology;  Laterality: Bilateral;  . Cystoscopy N/A 10/14/2013    Procedure: CYSTOSCOPY;  Surgeon: Ena Dawley, MD;  Location: [facility];  Service: Gynecology;  Laterality: N/A;  . Laparoscopic lysis of adhesions N/A 10/14/2013    Procedure: LAPAROSCOPIC LYSIS OF ADHESIONS;  Surgeon: Ena Dawley, MD;  Location: [facility];  Service: Gynecology;  Laterality: N/A;  . Laparoscopic bilateral salpingo oopherectomy Bilateral 10/14/2013    Procedure: LAPAROSCOPIC BILATERAL SALPINGO OOPHORECTOMY;  Surgeon: Ena Dawley, MD;  Location: [facility];  Service: Gynecology;  Laterality: Bilateral;   History   Social History  . Marital Status: Married    Spouse Name: N/A    Number of Children: N/A  . Years of Education: N/A   Occupational History  . Not on file.   Social History Main Topics  . Smoking status: Former Smoker -- 0.35 packs/day    Types: Cigarettes    Quit date: 09/26/2013  . Smokeless tobacco: Never Used  . Alcohol Use: 12.6 oz/week    21 Glasses of wine per week  . Drug Use: No  . Sexual Activity: Not Currently    Birth Control/ Protection: None   Other Topics Concern  . Not on file   Social History Narrative  . No narrative on file   family history includes Cancer in her maternal grandmother and paternal grandmother; Heart disease in her father; Hypertension in her daughter, maternal grandfather, paternal grandfather, and son. Current facility-administered medications:0.9 %  sodium chloride infusion, , Intravenous, Continuous, Richarda Blade, MD, Last Rate: 125 mL/hr at 10/27/13 0743;  0.9 %  sodium chloride infusion, , Intravenous, Continuous, Shanda Howells, MD;  acetylcysteine (MUCOMYST) 20 % nebulizer / oral solution  600 mg, 600 mg, Oral, BID, Shanda Howells, MD;  morphine 4 MG/ML injection 4 mg, 4 mg, Intravenous, Q3H PRN, Rhetta Mura Schorr, NP, 4 mg at 10/27/13 1141 ondansetron (ZOFRAN) injection 4 mg, 4 mg, Intravenous, Q6H PRN, Rhetta Mura Schorr, NP, 4 mg at 10/27/13 0406;  pantoprazole (PROTONIX) injection 40 mg, 40 mg, Intravenous, Q12H, Shanda Howells, MD, 40 mg at 10/27/13 1125;  sodium chloride 0.9 % injection 3 mL, 3 mL, Intravenous, Q12H, Shanda Howells, MD Allergies  Allergen Reactions  .  Bee Venom Anaphylaxis     Objective:     BP 116/73  Pulse 110  Temp(Src) 98.5 F (36.9 C) (Oral)  Resp 16  Ht 5\' 4"  (1.626 m)  Wt 89.4 kg (197 lb 1.5 oz)  BMI 33.81 kg/m2  SpO2 98%  LMP 05/29/2013  She is alert and oriented and in no acute distress  Nonicteric  Heart regular rhythm no murmurs  Lungs clear  Abdomen: Bowel sounds normal, soft, nontender, no hepatomegaly  Laboratory No components found with this basename: d1      Assessment:     #1. Anemia with recent drop in hemoglobin and hematocrit of uncertain etiology.      Plan:     Since there is no history of excessive vaginal bleeding post hysterectomy recently and she did have dark-colored stools even though she was taking iron and also she was on an NSAID it raises the question of a possible upper GI bleed. Her stools were heme positive. I will plan to do EGD later today. She states she has never had a colonoscopy, and doesn't want one. Lab Results  Component Value Date   HGB 9.7* 10/27/2013   HGB 5.6* 10/26/2013   HGB 10.0* 10/15/2013   HGB 14.7 01/24/2012   HGB 13.3 09/27/2011   HGB 13.0 06/27/2011   HCT 29.4* 10/27/2013   HCT 17.4* 10/26/2013   HCT 30.4* 10/15/2013   HCT 43.7 01/24/2012   HCT 39.1 09/27/2011   HCT 38.2 06/27/2011   ALKPHOS 150* 10/27/2013   ALKPHOS 102 07/30/2013   ALKPHOS 87 01/29/2013   AST 58* 10/27/2013   AST 131* 07/30/2013   AST 62* 01/29/2013   ALT 34 10/27/2013   ALT 79* 07/30/2013   ALT 41* 01/29/2013      Component Value Date/Time   WBC 6.6 10/27/2013 0800   WBC 7.1 01/24/2012 1539   HGB 9.7* 10/27/2013 0800   HGB 14.7 01/24/2012 1539   HCT 29.4* 10/27/2013 0800   HCT 43.7 01/24/2012 1539   PLT PLATELET CLUMPS NOTED ON SMEAR, COUNT APPEARS ADEQUATE 10/27/2013 0800   PLT 258 01/24/2012 1539   ALT 34 10/27/2013 0800   AST 58* 10/27/2013 0800   NA 139 10/27/2013 0800   K 3.9 10/27/2013 0800   CL 102 10/27/2013 0800   CREATININE 1.34* 10/27/2013 0800   BUN 9 10/27/2013 0800   CO2 19 10/27/2013 0800   CALCIUM  8.1* 10/27/2013 0800   ALKPHOS 150* 10/27/2013 0800

## 2013-10-27 NOTE — Progress Notes (Signed)
Gynecology note   Subjective:  The patient had a laparoscopy assisted vaginal hysterectomy, laparoscopic bilateral salpingectomy, and cystoscopy on 10/14/2013. Her postoperative hemoglobin was 10. She presented on 10/26/2013 complaining of dizziness and fainting spells. Her hemoglobin was noted to be 5. A CT scan was performed and it was read as negative. There was no evidence of intraperitoneal bleeding. No masses or abscesses were appreciated. The patient did have a guaiac positive stool.  Patient reports that she did not sleep all night.  Objective: I have reviewed patient's vital signs.  General: no distress Resp: clear to auscultation bilaterally Cardio: regular rate and rhythm, S1, S2 normal, no murmur, click, rub or gallop GI: soft, non-tender; bowel sounds normal; no masses,  no organomegaly Extremities: extremities normal, atraumatic, no cyanosis or edema Vaginal Bleeding: none   Assessment/Plan:  Drop in hemoglobin of uncertain etiology. There is no evidence of postoperative bleeding and there is no gynecologic source for the change of hemoglobin from 10 to 5.  I agree that a gastrointestinal source should be explored.  Please call if I can be of assistance.   LOS: 1 day    Eli Hose 10/27/2013, 7:27 AM  [phone]

## 2013-10-27 NOTE — Progress Notes (Signed)
PROGRESS NOTE    Circe Chilton QIO:[id number] DOB: 1960/12/05 DOA: 10/26/2013 PCP: No PCP Per Patient  HPI/Brief narrative 53 year old female patient with history of breast cancer status post lumpectomy, radiation and chemotherapy in the past, laparoscopic-assisted vaginal hysterectomy on 10/14/13 and patient states that she did not have significant blood loss, presents with progressive weakness, dizziness and syncopal episodes since hospital discharge 8/21. She complained of some nausea, mostly dry heaves and occasional vomiting without coffee-ground. Dark stools which he attributes to iron supplements. She denies postoperative vaginal bleeding. She has been taking ibuprofen daily. In the ED, she was found to be significantly anemic with hemoglobin of 5.6-down from 10 g per DL on 8/21 and acute renal failure. Transfused 2 units of PRBCs. GI consulted and EGD shows antral ulcer.   Assessment/Plan:  1. Acute blood loss anemia: Likely from antral ulcer seen on EGD 9/2 and postoperative blood loss from recent vaginal hysterectomy. Hemoglobin has improved from 5.6 > 8.5 after 2 units PRBC. Follow CBC. No overt bleeding. CT abdomen negative for intra-abdominal bleeding. 2. Prepyloric antral ulcer/? GI bleed: Seen on EGD but no active bleeding. Follow biopsy results. PPI. GI consultation appreciated. 3. Acute renal failure: Likely prerenal from dehydration, anemia and hypotension. Creatinine has improved from 1.5 > 1.3. Continue brief gentle IV fluids and follow BMP in a.m. 4. Recurrent syncope: Secondary to profound anemia. Rule out orthostatic hypotension. Ambulate with assistance and monitor. Recent normal stress test and mild LVH on 2-D echo. 5. Hypertension: Currently controlled off medications. 6. Status post recent vaginal hysterectomy and bilateral Oophorectomy: CT abdomen reports nonspecific left adnexal density and osseus lesions within the pelvis concerning for metastatic disease-outpatient  followup/comparison with prior imaging and if none, may consider bone scan. 7. History of breast cancer: Outpatient followup   Code Status:  Full  Family Communication:  discussed with spouse at bedside. Disposition Plan:  home when medically stable    Consultants:  Gastroenterology   Procedures:   EGD 9/2   Antibiotics:   None    Subjective:  felt much better this morning-stronger. Denied pain, dizziness or lightheadedness. No recent BM, nausea or vomiting.  Objective: Filed Vitals:   10/27/13 1445 10/27/13 1450 10/27/13 1455 10/27/13 1515  BP: 141/89  141/84 129/82  Pulse: 102 106 107 103  Temp:    99.4 F (37.4 C)  TempSrc:    Oral  Resp: 13 16 13 16   Height:      Weight:      SpO2: 97% 94% 95% 96%    Intake/Output Summary (Last 24 hours) at 10/27/13 1643 Last data filed at 10/27/13 1500  Gross per 24 hour  Intake 1808.75 ml  Output   1600 ml  Net 208.75 ml   Filed Weights   10/27/13 0233  Weight: 89.4 kg (197 lb 1.5 oz)     Exam:  General exam:  pleasant middle-aged female lying comfortably in bed.  Respiratory system: Clear. No increased work of breathing. Cardiovascular system: S1 & S2 heard, RRR. No JVD, murmurs, gallops, clicks or pedal edema. Telemetry: Sinus tachycardia in the 100-110's Gastrointestinal system: Abdomen is nondistended, soft and nontender. Normal bowel sounds heard. Central nervous system: Alert and oriented. No focal neurological deficits. Extremities: Symmetric 5 x 5 power.   Data Reviewed: Basic Metabolic Panel:  Recent Labs Lab 10/26/13 1854 10/27/13 0800  NA 136* 139  K 4.3 3.9  CL 100 102  CO2 19 19  GLUCOSE 102* 83  BUN 11 9  CREATININE 1.51* 1.34*  CALCIUM 8.4 8.1*   Liver Function Tests:  Recent Labs Lab 10/27/13 0800  AST 58*  ALT 34  ALKPHOS 150*  BILITOT 0.7  PROT 6.3  ALBUMIN 3.1*   No results found for this basename: LIPASE, AMYLASE,  in the last 168 hours No results found for this  basename: AMMONIA,  in the last 168 hours CBC:  Recent Labs Lab 10/26/13 1854 10/27/13 0800 10/27/13 1336  WBC 9.9 6.6 6.1  NEUTROABS 7.0 3.6 3.5  HGB 5.6* 9.7* 8.5*  HCT 17.4* 29.4* 25.2*  MCV 108.1* 98.3 96.6  PLT 527* PLATELET CLUMPS NOTED ON SMEAR, COUNT APPEARS ADEQUATE 439*   Cardiac Enzymes: No results found for this basename: CKTOTAL, CKMB, CKMBINDEX, TROPONINI,  in the last 168 hours BNP (last 3 results) No results found for this basename: PROBNP,  in the last 8760 hours CBG: No results found for this basename: GLUCAP,  in the last 168 hours  No results found for this or any previous visit (from the past 240 hour(s)).    Additional labs: 1. Anemia panel: Iron 109, TIBC 250, saturation ratios 44, ferritin 389, folate 10 and B12: 637. Reticulocyte count 381      Studies: Ct Abdomen Pelvis W Contrast  10/27/2013   CLINICAL DATA:  Anemia, recent hysterectomy, lower abdominal pressure. Rectal bleeding.  EXAM: CT ABDOMEN AND PELVIS WITH CONTRAST  TECHNIQUE: Multidetector CT imaging of the abdomen and pelvis was performed using the standard protocol following bolus administration of intravenous contrast.  CONTRAST:  28mL OMNIPAQUE IOHEXOL 300 MG/ML SOLN, 160mL OMNIPAQUE IOHEXOL 300 MG/ML SOLN  COMPARISON:  None.  FINDINGS: Mild linear opacity is within the right greater than left lower lungs, favor scarring. Normal heart size.  Severe hepatic steatosis.  Lobular hepatic contour.  No appreciable abnormality of the spleen, pancreas, biliary system, adrenal glands, kidneys. No hydroureteronephrosis.  No overt colitis. Normal appendix. Small bowel loops are of normal course and caliber. Tiny fat containing umbilical hernia. No free intraperitoneal air. No lymphadenopathy.  Scattered atherosclerotic disease of the aorta and branch vessels without aneurysmal dilatation.  Small amount of ill-defined fluid within the pelvis, nonspecific when the recent postoperative state. Nonspecific  appearance to the left adnexa with 2.1 and 2.7 cm densities (series 2, image 58 and 62). Per the patient's medical record, status post bilateral oophorectomy. Absent uterus. Thin walled bladder.  Sequelae of left inferior pubic ramus fracture. There is a lytic lesion associated with this on image 77 series 2. Sclerotic focus right iliac bone image 53 and left iliac bone image 50. Rounded sclerotic focus left hemi sacrum series 2, image 51. Sequelae of prior posterior left eleventh rib fracture.  IMPRESSION: Mild stranding and ill-defined fluid within the pelvis is nonspecific and often seen in the recent postoperative state. No large amount of intraperitoneal fluid or contained fluid collection to explain the patient's drop in hemoglobin.  Nonspecific 2.7 cm and 2.1 cm density left adnexa. Per report, post oophorectomy. Correlate with follow-up ultrasound.  Osseous lesions within the pelvis raise concern for metastatic disease. Does the patient have outside imaging to evaluate for stability? If not, consider bone scan.   Electronically Signed   By: Carlos Levering M.D.   On: 10/27/2013 00:41        Scheduled Meds: . acetylcysteine  600 mg Oral BID  . pantoprazole (PROTONIX) IV  40 mg Intravenous Q12H  . sodium chloride  3 mL Intravenous Q12H   Continuous Infusions: . sodium chloride  125 mL/hr at 10/27/13 0743  . sodium chloride      Active Problems:   Acute blood loss anemia   Abdominal pain   AKI (acute kidney injury)   Anemia   GIB (gastrointestinal bleeding)    Time spent: 45 minutes.    Vernell Leep, MD, FACP, FHM. [facility] Pager [phone]  If 7PM-7AM, please contact night-coverage [email or web address] Password [email or web address] 10/27/2013, 4:43 PM    LOS: 1 day

## 2013-10-27 NOTE — ED Notes (Signed)
Pt just returned from CT scan, will get blood vitals at this time

## 2013-10-27 NOTE — H&P (Addendum)
Hospitalist History and Physical    Patient name: Brittany Blair Medical record number: [id number]   Date of birth: 1960/11/12 Age: 53 y.o. Gender: female   Primary Care Provider: No PCP Per Patient   Chief Complaint: syncope, abdominal pain, ABLA, AKI   History of Present Illness:This is a 53 y.o. year old female with significant past medical history of breast cancer s/p lumpectomy, radiation and chemotherapy, post menopausal bleeding, HTN presenting with syncope, abdominal pain, ABLA, AKI. Pt is noted to have had laprascopic assisted vaginal hysterectomy, bilateral oopherectomy, and lysis of adhesions on 10/14/2013. Pt states that she was discharged from the hospital the following day and since this point has had progressive weakness, dizziness, orthostasis and syncope. Pt states that she has had recurrent episodes of LOC and progressive weakness. States that she stopped taking her BP meds in the middle of the week and has still been symtpomatic. Symptoms have progressively worsened. Has also had nausea, abdominal pain. Denies any bleeding vaginally or from laproscopic sites. Denies any melena or BRBPR. Denies any hx/o GI bleeding in the past. Has been taking NSAIDs intermittently for pain in conjunction with vicodin. Pt went to see her gynecologist Dr. Raphael Gibney about sxs. Was noted to be anemic and orthostatic and was directed to the ER for further evaluation.  In the ER, afebrile. HR in the 120s primarily. BP 100s-130s. Satting 95% on RA. Hgb 5.6, Cr 1.5, BUN 11. Noted to be hemoccult positive.   Assessment and Plan:  Brittany Blair is a 53 y.o. year old female presenting with syncope, abdominal pain, ABLA, AKI  Active Problems:  Acute blood loss anemia  Abdominal pain  AKI (acute kidney injury)   -ABLA/abd pain/GIB   Fairly broad ddx for sxs inlcuding GI and postoperative sources of sxs. CT negative for intra-abdominal blood. Pt does have risk factors for GI source given recent NSAID use in  setting of hemoccult positive stools, though BUN is WNL.  - f/u CT negative for intra-abd bleeding -transfuse pRBCs -HOLD offending agents  -serial CBCs -GI consult in am (has not had a colonoscopy in the past per pt).  -high dose PPI -anemia panel   -AKI  -likely secondary to above  -continue IV hydration and pRBC transfusion  -hold NSAIDs  -mucomyst and IVF prior to CT   -Syncope -Likely secondary to #1 -Continue IVFs -transfuse pRBCs -check orthostatics -2D ECHO   -HTN -symptomatic anemia -Had recent cardiac clearance with normal stress test. Mild LVH on 2D ECHO. Watch fluid status.  -hold BP meds   -GYN -s/p hysterectomy and bilateral oopherectomy  -? Metastatic findings on imaging -consider GYN vs. H-O consult in am    FEN/GI:NPO, PPI PPx: SCDs Dispo: pending further evaluation Code Status: Full Code    Patient Active Problem List    Diagnosis  Date Noted   .  Post-menopausal bleeding  10/14/2013   .  PMB (postmenopausal bleeding)  06/23/2013   .  Genital herpes  08/04/2012   .  Syncope  04/28/2012   .  GAD (generalized anxiety disorder)  04/28/2012   .  HTN (hypertension), benign  04/28/2012   .  Chronic chest pain  04/28/2012   .  Depression  04/28/2012   .  Transaminitis  04/28/2012   .  Breast Cancer, IDC,Left, central, Stage III, receptor + Her2 -  05/28/2010     Class: Stage 3    Past Medical History:  Past Medical History   Diagnosis  Date   .  Anxiety    .  Herpes    .  MRSA (methicillin resistant Staphylococcus aureus)    .  Breast lump      left   .  Hypertension    .  Hematoma    .  Swelling      left foot   .  Multiple blisters      along surgical site   .  Breast cancer    .  Depression    .  Genital herpes  08/04/2012   .  PMB (postmenopausal bleeding)  06/23/2013   .  Lymph edema    .  Vaginal delivery  1981, 1984, [place]    Past Surgical History:  Past Surgical History   Procedure  Laterality  Date   .  Tubal ligation      .  Wisdom tooth extraction     .  Mastectomy modified radical   10/10/10     left -Dr Margot Chimes   .  Laparoscopic assisted vaginal hysterectomy  Bilateral  10/14/2013     Procedure: LAPAROSCOPIC ASSISTED VAGINAL HYSTERECTOMY; Surgeon: Ena Dawley, MD; Location: [facility]; Service: Gynecology; Laterality: Bilateral;   .  Cystoscopy  N/A  10/14/2013     Procedure: CYSTOSCOPY; Surgeon: Ena Dawley, MD; Location: [facility]; Service: Gynecology; Laterality: N/A;   .  Laparoscopic lysis of adhesions  N/A  10/14/2013     Procedure: LAPAROSCOPIC LYSIS OF ADHESIONS; Surgeon: Ena Dawley, MD; Location: [facility]; Service: Gynecology; Laterality: N/A;   .  Laparoscopic bilateral salpingo oopherectomy  Bilateral  10/14/2013     Procedure: LAPAROSCOPIC BILATERAL SALPINGO OOPHORECTOMY; Surgeon: Ena Dawley, MD; Location: [facility]; Service: Gynecology; Laterality: Bilateral;    Social History:  History    Social History   .  Marital Status:  Married     Spouse Name:  N/A     Number of Children:  N/A   .  Years of Education:  N/A    Social History Main Topics   .  Smoking status:  Former Smoker -- 0.35 packs/day     Types:  Cigarettes     Quit date:  09/26/2013   .  Smokeless tobacco:  Never Used   .  Alcohol Use:  12.6 oz/week     21 Glasses of wine per week   .  Drug Use:  No   .  Sexual Activity:  Not Currently     Birth Control/ Protection:  None    Other Topics  Concern   .  None    Social History Narrative   .  None    Family History:  Family History   Problem  Relation  Age of Onset   .  Heart disease  Father      heart attack   .  Hypertension  Daughter    .  Hypertension  Son    .  Cancer  Maternal Grandmother      ovarian   .  Hypertension  Maternal Grandfather    .  Cancer  Paternal Grandmother      ovarian   .  Hypertension  Paternal Grandfather     Allergies:  Allergies   Allergen  Reactions   .  Bee Venom  Anaphylaxis    No current facility-administered  medications for this encounter.    Current Outpatient Prescriptions   Medication  Sig  Dispense  Refill   .  acyclovir (ZOVIRAX) 400 MG tablet  Take 400 mg by  mouth daily.     Marland Kitchen  ALPRAZolam (XANAX) 0.5 MG tablet  Take 1 tablet (0.5 mg total) by mouth 3 (three) times daily as needed for anxiety.  90 tablet  3   .  clotrimazole (LOTRIMIN) 1 % cream  Apply 1 application topically 2 (two) times daily as needed (yeast rash).     .  diphenhydrAMINE (BENADRYL) 25 MG tablet  Take 25 mg by mouth every 6 (six) hours as needed for allergies.     .  ferrous sulfate (FERROUSUL) 325 (65 FE) MG tablet  Take 1 tablet (325 mg total) by mouth 2 (two) times daily with a meal.  100 tablet  1   .  HYDROcodone-acetaminophen (NORCO/VICODIN) 5-325 MG per tablet  Take 1 tablet by mouth every 4 (four) hours as needed (pain).  30 tablet  0   .  ibuprofen (ADVIL,MOTRIN) 800 MG tablet  Take 800 mg by mouth every 8 (eight) hours as needed for moderate pain.     Marland Kitchen  lisinopril (PRINIVIL,ZESTRIL) 40 MG tablet  Take 40 mg by mouth daily.     Marland Kitchen  loratadine (CLARITIN) 10 MG tablet  Take 10 mg by mouth daily as needed for allergies.     .  metoprolol succinate (TOPROL-XL) 25 MG 24 hr tablet  Take 25 mg by mouth daily.     .  promethazine (PHENERGAN) 12.5 MG tablet  Take 1 tablet (12.5 mg total) by mouth every 6 (six) hours as needed for nausea or vomiting.  30 tablet  0   .  tetrahydrozoline-zinc (VISINE-AC) 0.05-0.25 % ophthalmic solution  Place 2 drops into both eyes 3 (three) times daily as needed (Red Eyes).     .  traZODone (DESYREL) 50 MG tablet  Take 1 tablet (50 mg total) by mouth at bedtime.  30 tablet  6   .  venlafaxine XR (EFFEXOR-XR) 75 MG 24 hr capsule  Take 225 mg by mouth every evening.     .  [DISCONTINUED] FLUoxetine (PROZAC) 20 MG tablet  Take 40 mg by mouth daily.      Review Of Systems: 12 point ROS negative except as noted above in HPI.  Physical Exam:  Filed Vitals:    10/26/13 2230   BP:  130/85   Pulse:   127   Temp:    Resp:  20    General: alert and cooperative  HEENT: PERRLA and extra ocular movement intact  Heart: tachycardic, no murmurs  Lungs: clear to auscultation, no wheezes or rales and unlabored breathing  Abdomen: mildly obese abdomen, + TTP in lower abdomen, + bowel sounds  Extremities: extremities normal, atraumatic, no cyanosis or edema  Skin:generalized pallor  Neurology: normal without focal findings  Labs and Imaging:  Lab Results   Component  Value  Date/Time    NA  136*  10/26/2013 6:54 PM    K  4.3  10/26/2013 6:54 PM    CL  100  10/26/2013 6:54 PM    CO2  19  10/26/2013 6:54 PM    BUN  11  10/26/2013 6:54 PM    CREATININE  1.51*  10/26/2013 6:54 PM    GLUCOSE  102*  10/26/2013 6:54 PM    Lab Results   Component  Value  Date    WBC  9.9  10/26/2013    HGB  5.6*  10/26/2013    HCT  17.4*  10/26/2013    MCV  108.1*  10/26/2013    PLT  527*  10/26/2013    Urinalysis    Component  Value  Date/Time    COLORURINE  YELLOW  10/26/2013 1942    APPEARANCEUR  CLOUDY*  10/26/2013 1942    LABSPEC  1.008  10/26/2013 1942    PHURINE  5.5  10/26/2013 1942    GLUCOSEU  NEGATIVE  10/26/2013 1942    HGBUR  SMALL*  10/26/2013 1942    BILIRUBINUR  NEGATIVE  10/26/2013 1942    KETONESUR  NEGATIVE  10/26/2013 1942    PROTEINUR  30*  10/26/2013 1942    UROBILINOGEN  0.2  10/26/2013 1942    NITRITE  NEGATIVE  10/26/2013 1942    LEUKOCYTESUR  MODERATE*  10/26/2013 1942    No results found. Ct Abdomen Pelvis W Contrast  10/27/2013   CLINICAL DATA:  Anemia, recent hysterectomy, lower abdominal pressure. Rectal bleeding.  EXAM: CT ABDOMEN AND PELVIS WITH CONTRAST  TECHNIQUE: Multidetector CT imaging of the abdomen and pelvis was performed using the standard protocol following bolus administration of intravenous contrast.  CONTRAST:  39m OMNIPAQUE IOHEXOL 300 MG/ML SOLN, 1017mOMNIPAQUE IOHEXOL 300 MG/ML SOLN  COMPARISON:  None.  FINDINGS: Mild linear opacity is within the right greater than left lower lungs, favor scarring.  Normal heart size.  Severe hepatic steatosis.  Lobular hepatic contour.  No appreciable abnormality of the spleen, pancreas, biliary system, adrenal glands, kidneys. No hydroureteronephrosis.  No overt colitis. Normal appendix. Small bowel loops are of normal course and caliber. Tiny fat containing umbilical hernia. No free intraperitoneal air. No lymphadenopathy.  Scattered atherosclerotic disease of the aorta and branch vessels without aneurysmal dilatation.  Small amount of ill-defined fluid within the pelvis, nonspecific when the recent postoperative state. Nonspecific appearance to the left adnexa with 2.1 and 2.7 cm densities (series 2, image 58 and 62). Per the patient's medical record, status post bilateral oophorectomy. Absent uterus. Thin walled bladder.  Sequelae of left inferior pubic ramus fracture. There is a lytic lesion associated with this on image 77 series 2. Sclerotic focus right iliac bone image 53 and left iliac bone image 50. Rounded sclerotic focus left hemi sacrum series 2, image 51. Sequelae of prior posterior left eleventh rib fracture.  IMPRESSION: Mild stranding and ill-defined fluid within the pelvis is nonspecific and often seen in the recent postoperative state. No large amount of intraperitoneal fluid or contained fluid collection to explain the patient's drop in hemoglobin.  Nonspecific 2.7 cm and 2.1 cm density left adnexa. Per report, post oophorectomy. Correlate with follow-up ultrasound.  Osseous lesions within the pelvis raise concern for metastatic disease. Does the patient have outside imaging to evaluate for stability? If not, consider bone scan.   Electronically Signed   By: AnCarlos Levering.D.   On: 10/27/2013 00:41   No results found.  StShanda HowellsD  Pager: [phone]

## 2013-10-27 NOTE — Progress Notes (Signed)
Pt received 1 unit PRBC in ED and 1 unit PRBC on floor. Received a call from lab that another unit of PRBCs was ready. Contacted hospitalist regarding need for additional units and was informed to wait until labs are drawn to determine if pt needs to have additional transfusions.

## 2013-10-27 NOTE — ED Notes (Signed)
Pts blood transfusion still in process, will wait to draw labs after completion of transfusion.

## 2013-10-28 ENCOUNTER — Encounter (HOSPITAL_COMMUNITY): Payer: Self-pay | Admitting: Gastroenterology

## 2013-10-28 LAB — COMPREHENSIVE METABOLIC PANEL
ALT: 31 U/L (ref 0–35)
AST: 79 U/L — AB (ref 0–37)
Albumin: 2.6 g/dL — ABNORMAL LOW (ref 3.5–5.2)
Alkaline Phosphatase: 121 U/L — ABNORMAL HIGH (ref 39–117)
Anion gap: 15 (ref 5–15)
BILIRUBIN TOTAL: 0.4 mg/dL (ref 0.3–1.2)
BUN: 5 mg/dL — ABNORMAL LOW (ref 6–23)
CHLORIDE: 108 meq/L (ref 96–112)
CO2: 18 meq/L — AB (ref 19–32)
Calcium: 7.5 mg/dL — ABNORMAL LOW (ref 8.4–10.5)
Creatinine, Ser: 0.92 mg/dL (ref 0.50–1.10)
GFR calc Af Amer: 81 mL/min — ABNORMAL LOW (ref >90)
GFR, EST NON AFRICAN AMERICAN: 70 mL/min — AB (ref >90)
Glucose, Bld: 93 mg/dL (ref 70–99)
Potassium: 4.1 mEq/L (ref 3.7–5.3)
Sodium: 141 mEq/L (ref 137–147)
Total Protein: 5.6 g/dL — ABNORMAL LOW (ref 6.0–8.3)

## 2013-10-28 LAB — CBC WITH DIFFERENTIAL/PLATELET
Basophils Absolute: 0 10*3/uL (ref 0.0–0.1)
Basophils Relative: 1 % (ref 0–1)
Eosinophils Absolute: 0.5 10*3/uL (ref 0.0–0.7)
Eosinophils Relative: 8 % — ABNORMAL HIGH (ref 0–5)
HEMATOCRIT: 25.4 % — AB (ref 36.0–46.0)
Hemoglobin: 8.3 g/dL — ABNORMAL LOW (ref 12.0–15.0)
LYMPHS PCT: 23 % (ref 12–46)
Lymphs Abs: 1.3 10*3/uL (ref 0.7–4.0)
MCH: 32.2 pg (ref 26.0–34.0)
MCHC: 32.7 g/dL (ref 30.0–36.0)
MCV: 98.4 fL (ref 78.0–100.0)
MONOS PCT: 15 % — AB (ref 3–12)
Monocytes Absolute: 0.8 10*3/uL (ref 0.1–1.0)
NEUTROS ABS: 3 10*3/uL (ref 1.7–7.7)
Neutrophils Relative %: 53 % (ref 43–77)
Platelets: 452 10*3/uL — ABNORMAL HIGH (ref 150–400)
RBC: 2.58 MIL/uL — AB (ref 3.87–5.11)
RDW: 20.8 % — AB (ref 11.5–15.5)
WBC: 5.6 10*3/uL (ref 4.0–10.5)

## 2013-10-28 MED ORDER — METOPROLOL SUCCINATE ER 25 MG PO TB24
25.0000 mg | ORAL_TABLET | Freq: Every day | ORAL | Status: DC
  Administered 2013-10-28: 25 mg via ORAL
  Filled 2013-10-28: qty 1

## 2013-10-28 MED ORDER — HYDROCODONE-ACETAMINOPHEN 5-325 MG PO TABS
1.0000 | ORAL_TABLET | ORAL | Status: DC | PRN
  Administered 2013-10-28: 1 via ORAL
  Filled 2013-10-28: qty 1

## 2013-10-28 MED ORDER — PANTOPRAZOLE SODIUM 40 MG PO TBEC: 40.0000 mg | DELAYED_RELEASE_TABLET | Freq: Two times a day (BID) | ORAL | Status: DC

## 2013-10-28 NOTE — Discharge Summary (Signed)
Physician Discharge Summary  Brittany Blair VZC:[id number] DOB: 07-08-1960 DOA: 10/26/2013  PCP: No PCP Per Patient  Admit date: 10/26/2013 Discharge date: 10/28/2013  Time spent: Less than 30 minutes  Recommendations for Outpatient Follow-up:  1. Dr. Farrel Gobble, Oncology in 5-7 days with repeat labs (CBC & CMP). 2. Dr. Anson Fret, GI in 6 weeks. GI to followup on biopsy results and update patient as needed.  Discharge Diagnoses:  Active Problems:   Acute blood loss anemia   Abdominal pain   AKI (acute kidney injury)   GIB (gastrointestinal bleeding)   Syncope and collapse   Prepyloric ulcer   Discharge Condition: Improved & Stable  Diet recommendation: Heart healthy diet  Filed Weights   10/27/13 0233  Weight: 89.4 kg (197 lb 1.5 oz)    History of present illness:  53 year old female patient with history of breast cancer status post lumpectomy, radiation and chemotherapy in the past, laparoscopic-assisted vaginal hysterectomy on 10/14/13 and patient states that she did not have significant blood loss, presents with progressive weakness, dizziness and syncopal episodes since hospital discharge 8/21. She complained of some nausea, mostly dry heaves and occasional vomiting without coffee-ground. Dark stools which he attributes to iron supplements. She denies postoperative vaginal bleeding. She has been taking ibuprofen daily. In the ED, she was found to be significantly anemic with hemoglobin of 5.6-down from 10 g per DL on 8/21 and acute renal failure. Transfused 2 units of PRBCs. GI consulted and EGD shows antral ulcer.   Hospital Course:   1. Acute blood loss anemia: Likely from antral ulcer seen on EGD 9/2 and postoperative blood loss from recent vaginal hysterectomy. Hemoglobin has improved from 5.6 > 8.5 after 2 units PRBC. No overt bleeding. CT abdomen negative for intra-abdominal bleeding. Hemoglobin stable at 8.3. GI advised holding off iron supplements for 2 weeks.  Patient has been counseled extensively that it may take a few weeks-months before her blood counts gradually increase to previous baseline level. In the interim, she is advised to increase activity gradually and has been counseled regarding maneuvers to avoid orthostatic hypotension. She verbalizes understanding. 2. Prepyloric antral ulcer/? GI bleed: Seen on EGD but no active bleeding. Follow biopsy results. PPI. GI recommends couple of weeks of twice a day PPI followed by PPI once daily. Patient has been counseled to avoid tobacco, alcohol and NSAIDs. Patient is to followup with GI in 6 weeks and recommend repeat EGD in 8-12 weeks to ensure healing of gastric ulcer. 3. Acute renal failure: Likely prerenal from dehydration, anemia and hypotension. Resolved after IV fluids. Follow BMP in 5-7 days as outpatient. 4. Recurrent syncope: Secondary to profound anemia. Recent normal stress test and mild LVH on 2-D echo. Telemetry without arrhythmias. Orthostatic blood pressure checks today negative. Patient complained of mild dizziness on ambulating in the halls. She has been counseled regarding gradual increase in activity. 5. Hypertension: Blood pressure started to increase and patient was resumed on metoprolol today. Resume ACE inhibitors at discharge. 6. Status post recent vaginal hysterectomy and bilateral Oophorectomy: CT abdomen reports nonspecific left adnexal density and osseus lesions within the pelvis concerning for metastatic disease-outpatient followup/comparison with prior imaging and if none, may consider bone scan. Outpatient followup with oncology. 7. History of breast cancer: Outpatient followup. Patient states that she has not taken tamoxifen for 3 months due to bleeding issues. Now that she had hysterectomy, vaginal bleeding should not be an issue . She is advised to followup with her oncologist before starting same. 8.  Minimally abnormal LFTs: Unclear etiology. Follow LFTs as  outpatient.   Consultations:  Gastroenterology  Procedures:  EGD 10/27/13:1 cm antral ulcer   Discharge Exam:  Complaints:  No BM since yesterday. No nausea, vomiting or abdominal pain. Mild dizziness on ambulating the halls but significantly improved compared to prior to admission.  Filed Vitals:   10/27/13 1515 10/27/13 2032 10/28/13 0451 10/28/13 1303  BP: 129/82 130/87  118/61  Pulse: 103 104  98  Temp: 99.4 F (37.4 C) 99.3 F (37.4 C) 99.2 F (37.3 C) 98.2 F (36.8 C)  TempSrc: Oral Oral Oral Oral  Resp: 16 20 20 18   Height:      Weight:      SpO2: 96% 100% 100% 99%   General exam: pleasant middle-aged female sitting up comfortably in bed this morning.  Respiratory system: Clear. No increased work of breathing.  Cardiovascular system: S1 & S2 heard, RRR. No JVD, murmurs, gallops, clicks or pedal edema. Telemetry: Sinus tachycardia in the 100's  Gastrointestinal system: Abdomen is nondistended, soft and nontender. Normal bowel sounds heard.  Central nervous system: Alert and oriented. No focal neurological deficits.  Extremities: Symmetric 5 x 5 power.   Discharge Instructions      Discharge Instructions   Call MD for:  extreme fatigue    Complete by:  As directed      Call MD for:  persistant dizziness or light-headedness    Complete by:  As directed      Call MD for:    Complete by:  As directed   Black stools or blood in stools.     Diet - low sodium heart healthy    Complete by:  As directed      Increase activity slowly    Complete by:  As directed             Medication List    STOP taking these medications       diphenhydrAMINE 25 MG tablet  Commonly known as:  BENADRYL     ferrous sulfate 325 (65 FE) MG tablet  Commonly known as:  FERROUSUL     ibuprofen 800 MG tablet  Commonly known as:  ADVIL,MOTRIN      TAKE these medications       acyclovir 400 MG tablet  Commonly known as:  ZOVIRAX  Take 400 mg by mouth daily.      ALPRAZolam 0.5 MG tablet  Commonly known as:  XANAX  Take 1 tablet (0.5 mg total) by mouth 3 (three) times daily as needed for anxiety.     clotrimazole 1 % cream  Commonly known as:  LOTRIMIN  Apply 1 application topically 2 (two) times daily as needed (yeast rash).     HYDROcodone-acetaminophen 5-325 MG per tablet  Commonly known as:  NORCO/VICODIN  Take 1 tablet by mouth every 4 (four) hours as needed (pain).     lisinopril 40 MG tablet  Commonly known as:  PRINIVIL,ZESTRIL  Take 40 mg by mouth daily.     loratadine 10 MG tablet  Commonly known as:  CLARITIN  Take 10 mg by mouth daily as needed for allergies.     metoprolol succinate 25 MG 24 hr tablet  Commonly known as:  TOPROL-XL  Take 25 mg by mouth daily.     pantoprazole 40 MG tablet  Commonly known as:  PROTONIX  Take 1 tablet (40 mg total) by mouth 2 (two) times daily before a meal.     promethazine  12.5 MG tablet  Commonly known as:  PHENERGAN  Take 1 tablet (12.5 mg total) by mouth every 6 (six) hours as needed for nausea or vomiting.     tetrahydrozoline-zinc 0.05-0.25 % ophthalmic solution  Commonly known as:  VISINE-AC  Place 2 drops into both eyes 3 (three) times daily as needed (Red Eyes).     traZODone 50 MG tablet  Commonly known as:  DESYREL  Take 1 tablet (50 mg total) by mouth at bedtime.     venlafaxine XR 75 MG 24 hr capsule  Commonly known as:  EFFEXOR-XR  Take 225 mg by mouth every evening.       Follow-up Information   Schedule an appointment as soon as possible for a visit with Doroteo Bradford, MD. (to be seen in 5-7 days with repeat labs (CBC & BMP).)    Specialty:  Hematology and Oncology   Contact information:   [place] [address] [phone]        The results of significant diagnostics from this hospitalization (including imaging, microbiology, ancillary and laboratory) are listed below for reference.    Significant Diagnostic Studies: Ct Abdomen  Pelvis W Contrast  10/27/2013   CLINICAL DATA:  Anemia, recent hysterectomy, lower abdominal pressure. Rectal bleeding.  EXAM: CT ABDOMEN AND PELVIS WITH CONTRAST  TECHNIQUE: Multidetector CT imaging of the abdomen and pelvis was performed using the standard protocol following bolus administration of intravenous contrast.  CONTRAST:  50mL OMNIPAQUE IOHEXOL 300 MG/ML SOLN, 145mL OMNIPAQUE IOHEXOL 300 MG/ML SOLN  COMPARISON:  None.  FINDINGS: Mild linear opacity is within the right greater than left lower lungs, favor scarring. Normal heart size.  Severe hepatic steatosis.  Lobular hepatic contour.  No appreciable abnormality of the spleen, pancreas, biliary system, adrenal glands, kidneys. No hydroureteronephrosis.  No overt colitis. Normal appendix. Small bowel loops are of normal course and caliber. Tiny fat containing umbilical hernia. No free intraperitoneal air. No lymphadenopathy.  Scattered atherosclerotic disease of the aorta and branch vessels without aneurysmal dilatation.  Small amount of ill-defined fluid within the pelvis, nonspecific when the recent postoperative state. Nonspecific appearance to the left adnexa with 2.1 and 2.7 cm densities (series 2, image 58 and 62). Per the patient's medical record, status post bilateral oophorectomy. Absent uterus. Thin walled bladder.  Sequelae of left inferior pubic ramus fracture. There is a lytic lesion associated with this on image 77 series 2. Sclerotic focus right iliac bone image 53 and left iliac bone image 50. Rounded sclerotic focus left hemi sacrum series 2, image 51. Sequelae of prior posterior left eleventh rib fracture.  IMPRESSION: Mild stranding and ill-defined fluid within the pelvis is nonspecific and often seen in the recent postoperative state. No large amount of intraperitoneal fluid or contained fluid collection to explain the patient's drop in hemoglobin.  Nonspecific 2.7 cm and 2.1 cm density left adnexa. Per report, post oophorectomy.  Correlate with follow-up ultrasound.  Osseous lesions within the pelvis raise concern for metastatic disease. Does the patient have outside imaging to evaluate for stability? If not, consider bone scan.   Electronically Signed   By: Carlos Levering M.D.   On: 10/27/2013 00:41   Nm Myocar Multi W/spect W/wall Motion / Ef  10/12/2013   CLINICAL DATA:  53 year old woman with history of infiltrating ductal carcinoma of the left breast as well as vaginal bleeding, now scheduled for elective hysterectomy. She was noted to have baseline abnormal ECG with hypertension and history of intermittent  tobacco use. Also prior history of chest radiation for treatment of breast cancer. This study is requested to evaluate for the presence of ischemia.  EXAM: MYOCARDIAL IMAGING WITH SPECT (REST AND PHARMACOLOGIC-STRESS)  GATED LEFT VENTRICULAR WALL MOTION STUDY  LEFT VENTRICULAR EJECTION FRACTION  TECHNIQUE: Standard myocardial SPECT imaging was performed after resting intravenous injection of 10 mCi Tc-87m sestamibi. Subsequently, intravenous infusion of Lexiscan was performed under the supervision of the Cardiology staff. At peak effect of the drug, 30 mCi Tc-70m sestamibi was injected intravenously and standard myocardial SPECT imaging was performed. Quantitative gated imaging was also performed to evaluate left ventricular wall motion, and estimate left ventricular ejection fraction.  FINDINGS: Baseline tracing shows sinus rhythm with nonspecific T-wave abnormalities in the anterior leads. Lexiscan bolus was given in standard fashion. Heart rate increased from 82 beats per min up to 117 beats per min, and blood pressure increased from 117/69 up to 161/96. No chest pain was reported. Patient was anxious and tearful during infusion, complaining of shortness of breath. There were no diagnostic ST segment abnormalities to indicate ischemia, rare PACs noted.  Analysis of the overall perfusion data finds left arm down imaging with  breast tissue attenuation, overall adequate radiotracer uptake within the myocardium.  Tomographic views were obtained using the short axis, vertical long axis, and horizontal long axis planes. There is a very small, partially reversible apical anteroseptal defect that is most consistent with variable breast attenuation rather than ischemia.  Gated imaging reveals an EDV of 43, ESV of 8, LVEF of 80%, and TID ratio 0.81. No wall motion abnormalities were noted.  IMPRESSION: Low risk Lexiscan Cardiolite. There were no diagnostic ST segment abnormalities, rare PACs noted. Perfusion imaging is most consistent with variable breast attenuation affecting the apical anteroseptal wall, no large ischemic zones or evidence of scar identified. LVEF calculated at 80% with small LV chamber volumes and normal wall motion.   Electronically Signed   By: Rozann Lesches M.D.   On: 10/12/2013 11:54    Microbiology: Recent Results (from the past 240 hour(s))  URINE CULTURE     Status: None   Collection Time    10/26/13  7:42 PM      Result Value Ref Range Status   Specimen Description URINE, CLEAN CATCH   Final   Special Requests Normal   Final   Culture  Setup Time     Final   Value: 10/27/2013 01:02     Performed at [facility]     Final   Value: NO GROWTH     Performed at [facility]   Culture     Final   Value: NO GROWTH     Performed at [facility]   Report Status 10/27/2013 FINAL   Final     Labs: Basic Metabolic Panel:  Recent Labs Lab 10/26/13 1854 10/27/13 0800 10/28/13 0424  NA 136* 139 141  K 4.3 3.9 4.1  CL 100 102 108  CO2 19 19 18*  GLUCOSE 102* 83 93  BUN 11 9 5*  CREATININE 1.51* 1.34* 0.92  CALCIUM 8.4 8.1* 7.5*   Liver Function Tests:  Recent Labs Lab 10/27/13 0800 10/28/13 0424  AST 58* 79*  ALT 34 31  ALKPHOS 150* 121*  BILITOT 0.7 0.4  PROT 6.3 5.6*  ALBUMIN 3.1* 2.6*   No results found for this basename: LIPASE,  AMYLASE,  in the last 168 hours No results found for this basename: AMMONIA,  in the last 168 hours CBC:  Recent Labs Lab 10/26/13 1854 10/27/13 0800 10/27/13 1336 10/28/13 0424  WBC 9.9 6.6 6.1 5.6  NEUTROABS 7.0 3.6 3.5 3.0  HGB 5.6* 9.7* 8.5* 8.3*  HCT 17.4* 29.4* 25.2* 25.4*  MCV 108.1* 98.3 96.6 98.4  PLT 527* PLATELET CLUMPS NOTED ON SMEAR, COUNT APPEARS ADEQUATE 439* 452*   Cardiac Enzymes: No results found for this basename: CKTOTAL, CKMB, CKMBINDEX, TROPONINI,  in the last 168 hours BNP: BNP (last 3 results) No results found for this basename: PROBNP,  in the last 8760 hours CBG: No results found for this basename: GLUCAP,  in the last 168 hours  Additional labs:   Anemia panel: Iron 109, TIBC 250, saturation ratios 44, ferritin 389, folate 10 and B12: 637. Reticulocyte count 381     Signed:  HONGALGI,ANAND, MD, FACP, FHM. [facility] Pager [phone]  If 7PM-7AM, please contact night-coverage [email or web address] Password [email or web address] 10/28/2013, 5:09 PM

## 2013-10-28 NOTE — Progress Notes (Signed)
Chaplain provided support with pt and husband (Matha) at bedside.   Experiencing distress around history of loss - several suicides in family, loss of function / identity due to illness, lack of extended family support.  Provided grief support, emotional and spiritual support, prayers.  Pt has information for advance directives.  Will follow up for completion of advance directive if Shakti wishes to complete during this admission.    [place], [place]

## 2013-10-28 NOTE — Progress Notes (Signed)
[facility] Progress Note  Subjective: Patient doing well  Objective: Vital signs in last 24 hours: Temp:  [98.4 F (36.9 C)-99.4 F (37.4 C)] 99.2 F (37.3 C) (09/03 0451) Pulse Rate:  [102-129] 104 (09/02 2032) Resp:  [10-20] 20 (09/03 0451) BP: (129-158)/(79-104) 130/87 mmHg (09/02 2032) SpO2:  [94 %-100 %] 100 % (09/03 0451) Weight change:    PE: No distress  Lab Results: Results for orders placed during the hospital encounter of 10/26/13 (from the past 24 hour(s))  CBC WITH DIFFERENTIAL     Status: Abnormal   Collection Time    10/27/13  1:36 PM      Result Value Ref Range   WBC 6.1  4.0 - 10.5 K/uL   RBC 2.61 (*) 3.87 - 5.11 MIL/uL   Hemoglobin 8.5 (*) 12.0 - 15.0 g/dL   HCT 25.2 (*) 36.0 - 46.0 %   MCV 96.6  78.0 - 100.0 fL   MCH 32.6  26.0 - 34.0 pg   MCHC 33.7  30.0 - 36.0 g/dL   RDW 21.1 (*) 11.5 - 15.5 %   Platelets 439 (*) 150 - 400 K/uL   Neutrophils Relative % 56  43 - 77 %   Lymphocytes Relative 17  12 - 46 %   Monocytes Relative 17 (*) 3 - 12 %   Eosinophils Relative 9 (*) 0 - 5 %   Basophils Relative 1  0 - 1 %   Neutro Abs 3.5  1.7 - 7.7 K/uL   Lymphs Abs 1.0  0.7 - 4.0 K/uL   Monocytes Absolute 1.0  0.1 - 1.0 K/uL   Eosinophils Absolute 0.5  0.0 - 0.7 K/uL   Basophils Absolute 0.1  0.0 - 0.1 K/uL   Smear Review MORPHOLOGY UNREMARKABLE    CBC WITH DIFFERENTIAL     Status: Abnormal   Collection Time    10/28/13  4:24 AM      Result Value Ref Range   WBC 5.6  4.0 - 10.5 K/uL   RBC 2.58 (*) 3.87 - 5.11 MIL/uL   Hemoglobin 8.3 (*) 12.0 - 15.0 g/dL   HCT 25.4 (*) 36.0 - 46.0 %   MCV 98.4  78.0 - 100.0 fL   MCH 32.2  26.0 - 34.0 pg   MCHC 32.7  30.0 - 36.0 g/dL   RDW 20.8 (*) 11.5 - 15.5 %   Platelets 452 (*) 150 - 400 K/uL   Neutrophils Relative % 53  43 - 77 %   Neutro Abs 3.0  1.7 - 7.7 K/uL   Lymphocytes Relative 23  12 - 46 %   Lymphs Abs 1.3  0.7 - 4.0 K/uL   Monocytes Relative 15 (*) 3 - 12 %   Monocytes Absolute 0.8  0.1  - 1.0 K/uL   Eosinophils Relative 8 (*) 0 - 5 %   Eosinophils Absolute 0.5  0.0 - 0.7 K/uL   Basophils Relative 1  0 - 1 %   Basophils Absolute 0.0  0.0 - 0.1 K/uL  COMPREHENSIVE METABOLIC PANEL     Status: Abnormal   Collection Time    10/28/13  4:24 AM      Result Value Ref Range   Sodium 141  137 - 147 mEq/L   Potassium 4.1  3.7 - 5.3 mEq/L   Chloride 108  96 - 112 mEq/L   CO2 18 (*) 19 - 32 mEq/L   Glucose, Bld 93  70 - 99 mg/dL   BUN 5 (*)  6 - 23 mg/dL   Creatinine, Ser 0.92  0.50 - 1.10 mg/dL   Calcium 7.5 (*) 8.4 - 10.5 mg/dL   Total Protein 5.6 (*) 6.0 - 8.3 g/dL   Albumin 2.6 (*) 3.5 - 5.2 g/dL   AST 79 (*) 0 - 37 U/L   ALT 31  0 - 35 U/L   Alkaline Phosphatase 121 (*) 39 - 117 U/L   Total Bilirubin 0.4  0.3 - 1.2 mg/dL   GFR calc non Af Amer 70 (*) >90 mL/min   GFR calc Af Amer 81 (*) >90 mL/min   Anion gap 15  5 - 15    Studies/Results: Ct Abdomen Pelvis W Contrast  10/27/2013   CLINICAL DATA:  Anemia, recent hysterectomy, lower abdominal pressure. Rectal bleeding.  EXAM: CT ABDOMEN AND PELVIS WITH CONTRAST  TECHNIQUE: Multidetector CT imaging of the abdomen and pelvis was performed using the standard protocol following bolus administration of intravenous contrast.  CONTRAST:  7mL OMNIPAQUE IOHEXOL 300 MG/ML SOLN, 146mL OMNIPAQUE IOHEXOL 300 MG/ML SOLN  COMPARISON:  None.  FINDINGS: Mild linear opacity is within the right greater than left lower lungs, favor scarring. Normal heart size.  Severe hepatic steatosis.  Lobular hepatic contour.  No appreciable abnormality of the spleen, pancreas, biliary system, adrenal glands, kidneys. No hydroureteronephrosis.  No overt colitis. Normal appendix. Small bowel loops are of normal course and caliber. Tiny fat containing umbilical hernia. No free intraperitoneal air. No lymphadenopathy.  Scattered atherosclerotic disease of the aorta and branch vessels without aneurysmal dilatation.  Small amount of ill-defined fluid within the  pelvis, nonspecific when the recent postoperative state. Nonspecific appearance to the left adnexa with 2.1 and 2.7 cm densities (series 2, image 58 and 62). Per the patient's medical record, status post bilateral oophorectomy. Absent uterus. Thin walled bladder.  Sequelae of left inferior pubic ramus fracture. There is a lytic lesion associated with this on image 77 series 2. Sclerotic focus right iliac bone image 53 and left iliac bone image 50. Rounded sclerotic focus left hemi sacrum series 2, image 51. Sequelae of prior posterior left eleventh rib fracture.  IMPRESSION: Mild stranding and ill-defined fluid within the pelvis is nonspecific and often seen in the recent postoperative state. No large amount of intraperitoneal fluid or contained fluid collection to explain the patient's drop in hemoglobin.  Nonspecific 2.7 cm and 2.1 cm density left adnexa. Per report, post oophorectomy. Correlate with follow-up ultrasound.  Osseous lesions within the pelvis raise concern for metastatic disease. Does the patient have outside imaging to evaluate for stability? If not, consider bone scan.   Electronically Signed   By: Carlos Levering M.D.   On: 10/27/2013 00:41      Assessment: 1cm antral ulcer  Plan: PPI, ok for discharge from GI standpoint. Follow up with me in 6 weeks. Repeat EGD in 8-12 weeks to ensure healing of ulcer.    GANEM,SALEM F 10/28/2013, 11:02 AM     Component Value Date/Time   WBC 5.6 10/28/2013 0424   WBC 7.1 01/24/2012 1539   HGB 8.3* 10/28/2013 0424   HGB 14.7 01/24/2012 1539   HCT 25.4* 10/28/2013 0424   HCT 43.7 01/24/2012 1539   PLT 452* 10/28/2013 0424   PLT 258 01/24/2012 1539   ALT 31 10/28/2013 0424   AST 79* 10/28/2013 0424   NA 141 10/28/2013 0424   K 4.1 10/28/2013 0424   CL 108 10/28/2013 0424   CREATININE 0.92 10/28/2013 0424   BUN 5* 10/28/2013  0424   CO2 18* 10/28/2013 0424   CALCIUM 7.5* 10/28/2013 0424   ALKPHOS 121* 10/28/2013 0424

## 2013-10-28 NOTE — Progress Notes (Signed)
Discharge  to home, husband at bedside, PIV removed by NT. D/c instructions and follow up appointment done and was given to the patient

## 2013-10-30 LAB — TYPE AND SCREEN
ABO/RH(D): O POS
Antibody Screen: NEGATIVE
UNIT DIVISION: 0
Unit division: 0
Unit division: 0
Unit division: 0

## 2013-11-02 ENCOUNTER — Encounter (HOSPITAL_COMMUNITY): Payer: Self-pay | Attending: Oncology

## 2013-11-02 ENCOUNTER — Encounter (HOSPITAL_COMMUNITY): Payer: Self-pay

## 2013-11-02 VITALS — BP 150/94 | HR 122 | Temp 98.4°F | Resp 18 | Wt 193.4 lb

## 2013-11-02 DIAGNOSIS — F32A Depression, unspecified: Secondary | ICD-10-CM

## 2013-11-02 DIAGNOSIS — F3289 Other specified depressive episodes: Secondary | ICD-10-CM | POA: Insufficient documentation

## 2013-11-02 DIAGNOSIS — Z9221 Personal history of antineoplastic chemotherapy: Secondary | ICD-10-CM | POA: Insufficient documentation

## 2013-11-02 DIAGNOSIS — N951 Menopausal and female climacteric states: Secondary | ICD-10-CM

## 2013-11-02 DIAGNOSIS — A6000 Herpesviral infection of urogenital system, unspecified: Secondary | ICD-10-CM

## 2013-11-02 DIAGNOSIS — Z79899 Other long term (current) drug therapy: Secondary | ICD-10-CM | POA: Insufficient documentation

## 2013-11-02 DIAGNOSIS — C50312 Malignant neoplasm of lower-inner quadrant of left female breast: Secondary | ICD-10-CM

## 2013-11-02 DIAGNOSIS — F329 Major depressive disorder, single episode, unspecified: Secondary | ICD-10-CM

## 2013-11-02 DIAGNOSIS — C50319 Malignant neoplasm of lower-inner quadrant of unspecified female breast: Secondary | ICD-10-CM

## 2013-11-02 DIAGNOSIS — D500 Iron deficiency anemia secondary to blood loss (chronic): Secondary | ICD-10-CM

## 2013-11-02 DIAGNOSIS — C50919 Malignant neoplasm of unspecified site of unspecified female breast: Secondary | ICD-10-CM | POA: Insufficient documentation

## 2013-11-02 DIAGNOSIS — R232 Flushing: Secondary | ICD-10-CM

## 2013-11-02 DIAGNOSIS — C779 Secondary and unspecified malignant neoplasm of lymph node, unspecified: Secondary | ICD-10-CM | POA: Insufficient documentation

## 2013-11-02 DIAGNOSIS — I890 Lymphedema, not elsewhere classified: Secondary | ICD-10-CM

## 2013-11-02 DIAGNOSIS — N898 Other specified noninflammatory disorders of vagina: Secondary | ICD-10-CM | POA: Insufficient documentation

## 2013-11-02 LAB — COMPREHENSIVE METABOLIC PANEL
ALBUMIN: 3.2 g/dL — AB (ref 3.5–5.2)
ALT: 43 U/L — ABNORMAL HIGH (ref 0–35)
ANION GAP: 17 — AB (ref 5–15)
AST: 94 U/L — ABNORMAL HIGH (ref 0–37)
Alkaline Phosphatase: 162 U/L — ABNORMAL HIGH (ref 39–117)
BUN: 4 mg/dL — AB (ref 6–23)
CO2: 23 mEq/L (ref 19–32)
Calcium: 8.5 mg/dL (ref 8.4–10.5)
Chloride: 99 mEq/L (ref 96–112)
Creatinine, Ser: 0.74 mg/dL (ref 0.50–1.10)
GFR calc Af Amer: >90 mL/min (ref >90)
GFR calc non Af Amer: >90 mL/min (ref >90)
Glucose, Bld: 89 mg/dL (ref 70–99)
POTASSIUM: 4.2 meq/L (ref 3.7–5.3)
Sodium: 139 mEq/L (ref 137–147)
TOTAL PROTEIN: 6.7 g/dL (ref 6.0–8.3)
Total Bilirubin: 0.4 mg/dL (ref 0.3–1.2)

## 2013-11-02 LAB — CBC WITH DIFFERENTIAL/PLATELET
BASOS PCT: 1 % (ref 0–1)
Basophils Absolute: 0.1 10*3/uL (ref 0.0–0.1)
EOS ABS: 0.7 10*3/uL (ref 0.0–0.7)
Eosinophils Relative: 10 % — ABNORMAL HIGH (ref 0–5)
HCT: 35 % — ABNORMAL LOW (ref 36.0–46.0)
Hemoglobin: 11.1 g/dL — ABNORMAL LOW (ref 12.0–15.0)
Lymphocytes Relative: 18 % (ref 12–46)
Lymphs Abs: 1.2 10*3/uL (ref 0.7–4.0)
MCH: 31.9 pg (ref 26.0–34.0)
MCHC: 31.7 g/dL (ref 30.0–36.0)
MCV: 100.6 fL — ABNORMAL HIGH (ref 78.0–100.0)
Monocytes Absolute: 0.9 10*3/uL (ref 0.1–1.0)
Monocytes Relative: 13 % — ABNORMAL HIGH (ref 3–12)
NEUTROS PCT: 58 % (ref 43–77)
Neutro Abs: 4.1 10*3/uL (ref 1.7–7.7)
PLATELETS: 592 10*3/uL — AB (ref 150–400)
RBC: 3.48 MIL/uL — ABNORMAL LOW (ref 3.87–5.11)
RDW: 16.7 % — AB (ref 11.5–15.5)
WBC: 7 10*3/uL (ref 4.0–10.5)

## 2013-11-02 NOTE — Progress Notes (Signed)
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 Breast Cancer  . Status post TAH/BSO on 10/14/2013    CURRENT THERAPY: No endocrine therapy at this time, tamoxifen discontinued because of vaginal bleeding. TAH/BSO on 10/14/2013  INTERVAL HISTORY: Brittany Blair 53 y.o. female returns for followup of stage III-A left breast cancer with definitive therapy on 10/10/2010 who developed vaginal bleeding on tamoxifen 10 mg daily and ultimately underwent TAH/BSO on 10/14/2013 at which time proliferative endometrium and endometrial polyps were found but no evidence of invasive malignancy. She is here today to discuss further endocrine therapy with a history of extremely poor tolerance of anastrozole. Details of previous chemotherapy as below in the interim history.  MEDICAL HISTORY: Past Medical History  Diagnosis Date  . Anxiety   . Herpes   . MRSA (methicillin resistant Staphylococcus aureus)   . Breast lump     left  . Hypertension   . Hematoma   . Swelling     left foot  . Multiple blisters     along surgical site  . Depression   . Genital herpes 08/04/2012  . PMB (postmenopausal bleeding) 06/23/2013  . Lymph edema   . Vaginal delivery 1981, 1984, 1987, 1992  . Breast cancer dx'd 05/27/2010    chemo/xrt comp 01/2011    INTERIM HISTORY: has Breast Cancer, IDC,Left, central, Stage III, receptor + Her2 -; Syncope; GAD (generalized anxiety disorder); HTN (hypertension), benign; Chronic chest pain;  Depression; Transaminitis; Genital herpes; PMB (postmenopausal bleeding); Post-menopausal bleeding; Acute blood loss anemia; Abdominal pain; AKI (acute kidney injury); GIB (gastrointestinal bleeding); Syncope and collapse; and Prepyloric ulcer on her problem list.   Infiltrating ductal carcinoma the left breast, grade 3, stage IIIA (probably downstaged from IIIB following neoadjuvant chemotherapy described below). She was given Taxotere x3 cycles preoperatively in a dose dense fashion followed by 2 cycles of dose dense FEC. Her chemotherapy was interrupted because of the severe MRSA infection prompting discontinuation. At the time of definitive surgery on 10/10/2010 she still had a 4.5 cm cancer with LV I and 7 of 8 positive lymph nodes. ER receptors 61% positive, PR receptors 100% positive, HER-2/neu nonamplified, Ki-67 are high at 75%. She was started on Arimidex but was intolerant to the medication with severe side effects. Then on 05/22/2012, she was transitioned to Tamoxifen. She has tolerated that well thus far until recently she started to have vaginal bleeding with clots. She was referred to a local gynecologist who performed an endometrial biopsy on 06/28/2013 that was negative for malignancy following a PAP smear on 06/23/2013 (that was negative as well). Underwent TAH/BSO on 10/14/2013 with evidence of proliferative endometrium and polyps with no evidence of invasive malignancy. Postoperatively she had to be readmitted to the hospital because she was told to take 100 mg of ibuprofen every 4 hours to control pain. She was found to have upper GI bleeding was transfused 2 units of packed red blood cells and discharged from hospital on 10/28/2013. Last hemoglobin was 8.3. She does feel weak. Left upper extremity swelling has  worsened after the hospitalization. She did not have IV fluids in that arm. Her previous light port was removed because of MRSA contamination. She denies any melena now and cannot tolerate oral  iron to 2 severe constipation and nausea. She is also concerned because her stools turned black and she is afraid that this may represent upper GI bleeding again. She does have a lymphedema sleeve at home but  it is too large to use at this time.  ALLERGIES:  is allergic to bee venom.  MEDICATIONS: has a current medication list which includes the following prescription(s): acyclovir, alprazolam, amlodipine, clotrimazole, hydrocodone-acetaminophen, lisinopril, loratadine, metoprolol succinate, pantoprazole, polyethylene glycol 3350, tetrahydrozoline-zinc, trazodone, venlafaxine xr, and promethazine.  SURGICAL HISTORY:  Past Surgical History  Procedure Laterality Date  . Tubal ligation    . Wisdom tooth extraction    . Mastectomy modified radical  10/10/10     left -Dr Margot Chimes  . Laparoscopic assisted vaginal hysterectomy Bilateral 10/14/2013    Procedure: LAPAROSCOPIC ASSISTED VAGINAL HYSTERECTOMY;  Surgeon: Ena Dawley, MD;  Location: [facility];  Service: Gynecology;  Laterality: Bilateral;  . Cystoscopy N/A 10/14/2013    Procedure: CYSTOSCOPY;  Surgeon: Ena Dawley, MD;  Location: [facility];  Service: Gynecology;  Laterality: N/A;  . Laparoscopic lysis of adhesions N/A 10/14/2013    Procedure: LAPAROSCOPIC LYSIS OF ADHESIONS;  Surgeon: Ena Dawley, MD;  Location: [facility];  Service: Gynecology;  Laterality: N/A;  . Laparoscopic bilateral salpingo oopherectomy Bilateral 10/14/2013    Procedure: LAPAROSCOPIC BILATERAL SALPINGO OOPHORECTOMY;  Surgeon: Ena Dawley, MD;  Location: [facility];  Service: Gynecology;  Laterality: Bilateral;  . Esophagogastroduodenoscopy N/A 10/27/2013    Procedure: ESOPHAGOGASTRODUODENOSCOPY (EGD);  Surgeon: Wonda Horner, MD;  Location: [facility];  Service: Endoscopy;  Laterality: N/A;    FAMILY HISTORY: family history includes Cancer in her maternal grandmother and paternal grandmother; Heart disease in her father; Hypertension in her daughter, maternal  grandfather, paternal grandfather, and son.  SOCIAL HISTORY:  reports that she quit smoking about 5 weeks ago. Her smoking use included Cigarettes. She smoked 0.35 packs per day. She has never used smokeless tobacco. She reports that she drinks about 12.6 ounces of alcohol per week. She reports that she does not use illicit drugs.  REVIEW OF SYSTEMS:  Other than that discussed above is noncontributory.  PHYSICAL EXAMINATION: ECOG PERFORMANCE STATUS: 1 - Symptomatic but completely ambulatory  Blood pressure 150/94, pulse 122, temperature 98.4 F (36.9 C), temperature source Oral, resp. rate 18, weight 193 lb 6.4 oz (87.726 kg), last menstrual period 05/29/2013, SpO2 100.00%.  GENERAL:alert, no distress and comfortable. Moderately obese. SKIN: skin color, texture, turgor are normal, no rashes or significant lesions EYES: PERLA; Conjunctiva are pink and non-injected, sclera clear SINUSES: No redness or tenderness over maxillary or ethmoid sinuses OROPHARYNX:no exudate, no erythema on lips, buccal mucosa, or tongue. NECK: supple, thyroid normal size, non-tender, without nodularity. No masses CHEST: Status post left mastectomy with induration on the chest wall. Left upper extremity possibly lymphedema. LYMPH:  no palpable lymphadenopathy in the cervical, axillary or inguinal LUNGS: clear to auscultation and percussion with normal breathing effort HEART: regular rate & rhythm and no murmurs. ABDOMEN:abdomen soft, non-tender and normal bowel sounds MUSCULOSKELETAL:no cyanosis of digits and no clubbing. Range of motion normal. Tenderness over the lumbar spine. NEURO: alert & oriented x 3 with fluent speech, no focal motor/sensory deficits   LABORATORY DATA: Admission on 10/26/2013, Discharged on 10/28/2013  Component Date Value Ref Range Status  . WBC 10/26/2013  9.9  4.0 - 10.5 K/uL Final  . RBC 10/26/2013 1.61* 3.87 - 5.11 MIL/uL Final  . Hemoglobin 10/26/2013 5.6* 12.0 - 15.0 g/dL Final    Comment: REPEATED TO VERIFY                          CRITICAL RESULT CALLED TO, READ BACK BY AND VERIFIED WITH:                          [place] 10/26/13 [place] V  . HCT 10/26/2013 17.4* 36.0 - 46.0 % Final  . MCV 10/26/2013 108.1* 78.0 - 100.0 fL Final  . MCH 10/26/2013 34.8* 26.0 - 34.0 pg Final  . MCHC 10/26/2013 32.2  30.0 - 36.0 g/dL Final  . RDW 10/26/2013 16.7* 11.5 - 15.5 % Final  . Platelets 10/26/2013 527* 150 - 400 K/uL Final  . Neutrophils Relative % 10/26/2013 70  43 - 77 % Final  . Neutro Abs 10/26/2013 7.0  1.7 - 7.7 K/uL Final  . Lymphocytes Relative 10/26/2013 9* 12 - 46 % Final  . Lymphs Abs 10/26/2013 0.9  0.7 - 4.0 K/uL Final  . Monocytes Relative 10/26/2013 14* 3 - 12 % Final  . Monocytes Absolute 10/26/2013 1.4* 0.1 - 1.0 K/uL Final  . Eosinophils Relative 10/26/2013 6* 0 - 5 % Final  . Eosinophils Absolute 10/26/2013 0.6  0.0 - 0.7 K/uL Final  . Basophils Relative 10/26/2013 0  0 - 1 % Final  . Basophils Absolute 10/26/2013 0.0  0.0 - 0.1 K/uL Final  . Sodium 10/26/2013 136* 137 - 147 mEq/L Final  . Potassium 10/26/2013 4.3  3.7 - 5.3 mEq/L Final  . Chloride 10/26/2013 100  96 - 112 mEq/L Final  . CO2 10/26/2013 19  19 - 32 mEq/L Final  . Glucose, Bld 10/26/2013 102* 70 - 99 mg/dL Final  . BUN 10/26/2013 11  6 - 23 mg/dL Final  . Creatinine, Ser 10/26/2013 1.51* 0.50 - 1.10 mg/dL Final  . Calcium 10/26/2013 8.4  8.4 - 10.5 mg/dL Final  . GFR calc non Af Amer 10/26/2013 38* >90 mL/min Final  . GFR calc Af Amer 10/26/2013 44* >90 mL/min Final   Comment: (NOTE)                          The eGFR has been calculated using the CKD EPI equation.                          This calculation has not been validated in all clinical situations.                          eGFR's persistently <90 mL/min signify possible Chronic Kidney                          Disease.  . Anion gap 10/26/2013 17* 5 - 15 Final  . Color, Urine 10/26/2013 YELLOW  YELLOW Final  .  APPearance 10/26/2013 CLOUDY* CLEAR Final  . Specific Gravity, Urine 10/26/2013 1.008  1.005 - 1.030 Final  . pH 10/26/2013 5.5  5.0 - 8.0 Final  . Glucose, UA 10/26/2013 NEGATIVE  NEGATIVE mg/dL Final  . Hgb urine dipstick 10/26/2013 SMALL* NEGATIVE Final  . Bilirubin Urine 10/26/2013 NEGATIVE  NEGATIVE Final  .  Ketones, ur 10/26/2013 NEGATIVE  NEGATIVE mg/dL Final  . Protein, ur 10/26/2013 30* NEGATIVE mg/dL Final  . Urobilinogen, UA 10/26/2013 0.2  0.0 - 1.0 mg/dL Final  . Nitrite 10/26/2013 NEGATIVE  NEGATIVE Final  . Leukocytes, UA 10/26/2013 MODERATE* NEGATIVE Final  . ABO/RH(D) 10/26/2013 O POS   Final  . Antibody Screen 10/26/2013 NEG   Final  . Sample Expiration 10/26/2013 10/29/2013   Final  . Unit Number 10/26/2013 [id number]   Final  . Blood Component Type 10/26/2013 RED CELLS,LR   Final  . Unit division 10/26/2013 00   Final  . Status of Unit 10/26/2013 ISSUED,FINAL   Final  . Transfusion Status 10/26/2013 OK TO TRANSFUSE   Final  . Crossmatch Result 10/26/2013 Compatible   Final  . Unit Number 10/26/2013 [id number]   Final  . Blood Component Type 10/26/2013 RED CELLS,LR   Final  . Unit division 10/26/2013 00   Final  . Status of Unit 10/26/2013 ISSUED,FINAL   Final  . Transfusion Status 10/26/2013 OK TO TRANSFUSE   Final  . Crossmatch Result 10/26/2013 Compatible   Final  . Unit Number 10/26/2013 [id number]   Final  . Blood Component Type 10/26/2013 RED CELLS,LR   Final  . Unit division 10/26/2013 00   Final  . Status of Unit 10/26/2013 REL FROM [facility]   Final  . Transfusion Status 10/26/2013 OK TO TRANSFUSE   Final  . Crossmatch Result 10/26/2013 Compatible   Final  . Unit Number 10/26/2013 [id number]   Final  . Blood Component Type 10/26/2013 RED CELLS,LR   Final  . Unit division 10/26/2013 00   Final  . Status of Unit 10/26/2013 REL FROM [facility]   Final  . Transfusion Status 10/26/2013 OK TO TRANSFUSE   Final  . Crossmatch Result 10/26/2013 Compatible    Final  . Squamous Epithelial / LPF 10/26/2013 FEW* RARE Final  . WBC, UA 10/26/2013 21-50  <3 WBC/hpf Final  . RBC / HPF 10/26/2013 7-10  <3 RBC/hpf Final  . Bacteria, UA 10/26/2013 FEW* RARE Final  . Order Confirmation 10/26/2013 ORDER PROCESSED BY BLOOD BANK   Final  . Fecal Occult Bld 10/26/2013 POSITIVE* NEGATIVE Final  . ABO/RH(D) 10/26/2013 O POS   Final  . Specimen Description 10/26/2013 URINE, CLEAN CATCH   Final  . Special Requests 10/26/2013 Normal   Final  . Culture  Setup Time 10/26/2013    Final                   Value:10/27/2013 01:02                         Performed at [facility]  . Colony Count 10/26/2013    Final                   Value:NO GROWTH                         Performed at [facility]  . Culture 10/26/2013    Final                   Value:NO GROWTH                         Performed at [facility]  . Report Status 10/26/2013 10/27/2013 FINAL   Final  . Order Confirmation 10/26/2013 ORDER PROCESSED BY BLOOD BANK  Final  . Sodium 10/27/2013 139  137 - 147 mEq/L Final  . Potassium 10/27/2013 3.9  3.7 - 5.3 mEq/L Final  . Chloride 10/27/2013 102  96 - 112 mEq/L Final  . CO2 10/27/2013 19  19 - 32 mEq/L Final  . Glucose, Bld 10/27/2013 83  70 - 99 mg/dL Final  . BUN 10/27/2013 9  6 - 23 mg/dL Final  . Creatinine, Ser 10/27/2013 1.34* 0.50 - 1.10 mg/dL Final  . Calcium 10/27/2013 8.1* 8.4 - 10.5 mg/dL Final  . Total Protein 10/27/2013 6.3  6.0 - 8.3 g/dL Final  . Albumin 10/27/2013 3.1* 3.5 - 5.2 g/dL Final  . AST 10/27/2013 58* 0 - 37 U/L Final  . ALT 10/27/2013 34  0 - 35 U/L Final  . Alkaline Phosphatase 10/27/2013 150* 39 - 117 U/L Final  . Total Bilirubin 10/27/2013 0.7  0.3 - 1.2 mg/dL Final  . GFR calc non Af Amer 10/27/2013 44* >90 mL/min Final  . GFR calc Af Amer 10/27/2013 51* >90 mL/min Final   Comment: (NOTE)                          The eGFR has been calculated using the CKD EPI equation.                           This calculation has not been validated in all clinical situations.                          eGFR's persistently <90 mL/min signify possible Chronic Kidney                          Disease.  . Anion gap 10/27/2013 18* 5 - 15 Final  . WBC 10/27/2013 6.6  4.0 - 10.5 K/uL Final  . RBC 10/27/2013 2.99* 3.87 - 5.11 MIL/uL Final  . Hemoglobin 10/27/2013 9.7* 12.0 - 15.0 g/dL Final   Comment: REPEATED TO VERIFY                          DELTA CHECK NOTED                          POST TRANSFUSION SPECIMEN  . HCT 10/27/2013 29.4* 36.0 - 46.0 % Final  . MCV 10/27/2013 98.3  78.0 - 100.0 fL Final   Comment: REPEATED TO VERIFY                          DELTA CHECK NOTED                          POST TRANSFUSION SPECIMEN  . MCH 10/27/2013 32.4  26.0 - 34.0 pg Final  . MCHC 10/27/2013 33.0  30.0 - 36.0 g/dL Final  . RDW 10/27/2013 20.5* 11.5 - 15.5 % Final  . Platelets 10/27/2013 PLATELET CLUMPS NOTED ON SMEAR, COUNT APPEARS ADEQUATE  150 - 400 K/uL Final  . Neutrophils Relative % 10/27/2013 55  43 - 77 % Final  . Lymphocytes Relative 10/27/2013 24  12 - 46 % Final  . Monocytes Relative 10/27/2013 14* 3 - 12 % Final  . Eosinophils Relative 10/27/2013 7* 0 - 5 % Final  . Basophils Relative 10/27/2013  0  0 - 1 % Final  . Neutro Abs 10/27/2013 3.6  1.7 - 7.7 K/uL Final  . Lymphs Abs 10/27/2013 1.6  0.7 - 4.0 K/uL Final  . Monocytes Absolute 10/27/2013 0.9  0.1 - 1.0 K/uL Final  . Eosinophils Absolute 10/27/2013 0.5  0.0 - 0.7 K/uL Final  . Basophils Absolute 10/27/2013 0.0  0.0 - 0.1 K/uL Final  . RBC Morphology 10/27/2013 POLYCHROMASIA PRESENT   Final  . WBC Morphology 10/27/2013 VACUOLATED NEUTROPHILS   Final   MILD LEFT SHIFT (1-5% METAS, OCC MYELO, OCC BANDS)  . Smear Review 10/27/2013 LARGE PLATELETS PRESENT   Final  . WBC 10/27/2013 6.1  4.0 - 10.5 K/uL Final  . RBC 10/27/2013 2.61* 3.87 - 5.11 MIL/uL Final  . Hemoglobin 10/27/2013 8.5* 12.0 - 15.0 g/dL Final  . HCT 10/27/2013 25.2* 36.0 -  46.0 % Final  . MCV 10/27/2013 96.6  78.0 - 100.0 fL Final  . MCH 10/27/2013 32.6  26.0 - 34.0 pg Final  . MCHC 10/27/2013 33.7  30.0 - 36.0 g/dL Final  . RDW 10/27/2013 21.1* 11.5 - 15.5 % Final  . Platelets 10/27/2013 439* 150 - 400 K/uL Final  . Neutrophils Relative % 10/27/2013 56  43 - 77 % Final  . Lymphocytes Relative 10/27/2013 17  12 - 46 % Final  . Monocytes Relative 10/27/2013 17* 3 - 12 % Final  . Eosinophils Relative 10/27/2013 9* 0 - 5 % Final  . Basophils Relative 10/27/2013 1  0 - 1 % Final  . Neutro Abs 10/27/2013 3.5  1.7 - 7.7 K/uL Final  . Lymphs Abs 10/27/2013 1.0  0.7 - 4.0 K/uL Final  . Monocytes Absolute 10/27/2013 1.0  0.1 - 1.0 K/uL Final  . Eosinophils Absolute 10/27/2013 0.5  0.0 - 0.7 K/uL Final  . Basophils Absolute 10/27/2013 0.1  0.0 - 0.1 K/uL Final  . Smear Review 10/27/2013 MORPHOLOGY UNREMARKABLE   Final  . Vitamin B-12 10/27/2013 637  211 - 911 pg/mL Final   Performed at [facility]  . Folate 10/27/2013 10.0   Final   Comment: (NOTE)                          Reference Ranges                                 Deficient:       0.4 - 3.3 ng/mL                                 Indeterminate:   3.4 - 5.4 ng/mL                                 Normal:              > 5.4 ng/mL                          Performed at [facility]  . Iron 10/27/2013 109  42 - 135 ug/dL Final  . TIBC 10/27/2013 250  250 - 470 ug/dL Final  . Saturation Ratios 10/27/2013 44  20 - 55 % Final  . UIBC 10/27/2013 141  125 - 400 ug/dL Final   Performed  at [facility]  . Ferritin 10/27/2013 389* 10 - 291 ng/mL Final   Performed at [facility]  . Retic Ct Pct 10/27/2013 12.7* 0.4 - 3.1 % Final  . RBC. 10/27/2013 3.00* 3.87 - 5.11 MIL/uL Final  . Retic Count, Manual 10/27/2013 381.0* 19.0 - 186.0 K/uL Final  . WBC 10/28/2013 5.6  4.0 - 10.5 K/uL Final  . RBC 10/28/2013 2.58* 3.87 - 5.11 MIL/uL Final  . Hemoglobin 10/28/2013 8.3* 12.0 - 15.0 g/dL  Final  . HCT 10/28/2013 25.4* 36.0 - 46.0 % Final  . MCV 10/28/2013 98.4  78.0 - 100.0 fL Final  . MCH 10/28/2013 32.2  26.0 - 34.0 pg Final  . MCHC 10/28/2013 32.7  30.0 - 36.0 g/dL Final  . RDW 10/28/2013 20.8* 11.5 - 15.5 % Final  . Platelets 10/28/2013 452* 150 - 400 K/uL Final  . Neutrophils Relative % 10/28/2013 53  43 - 77 % Final  . Neutro Abs 10/28/2013 3.0  1.7 - 7.7 K/uL Final  . Lymphocytes Relative 10/28/2013 23  12 - 46 % Final  . Lymphs Abs 10/28/2013 1.3  0.7 - 4.0 K/uL Final  . Monocytes Relative 10/28/2013 15* 3 - 12 % Final  . Monocytes Absolute 10/28/2013 0.8  0.1 - 1.0 K/uL Final  . Eosinophils Relative 10/28/2013 8* 0 - 5 % Final  . Eosinophils Absolute 10/28/2013 0.5  0.0 - 0.7 K/uL Final  . Basophils Relative 10/28/2013 1  0 - 1 % Final  . Basophils Absolute 10/28/2013 0.0  0.0 - 0.1 K/uL Final  . Sodium 10/28/2013 141  137 - 147 mEq/L Final  . Potassium 10/28/2013 4.1  3.7 - 5.3 mEq/L Final   Comment: MODERATE HEMOLYSIS                          HEMOLYSIS AT THIS LEVEL MAY AFFECT RESULT  . Chloride 10/28/2013 108  96 - 112 mEq/L Final  . CO2 10/28/2013 18* 19 - 32 mEq/L Final  . Glucose, Bld 10/28/2013 93  70 - 99 mg/dL Final  . BUN 10/28/2013 5* 6 - 23 mg/dL Final  . Creatinine, Ser 10/28/2013 0.92  0.50 - 1.10 mg/dL Final  . Calcium 10/28/2013 7.5* 8.4 - 10.5 mg/dL Final  . Total Protein 10/28/2013 5.6* 6.0 - 8.3 g/dL Final  . Albumin 10/28/2013 2.6* 3.5 - 5.2 g/dL Final  . AST 10/28/2013 79* 0 - 37 U/L Final   Comment: MODERATE HEMOLYSIS                          HEMOLYSIS AT THIS LEVEL MAY AFFECT RESULT  . ALT 10/28/2013 31  0 - 35 U/L Final   Comment: MODERATE HEMOLYSIS                          HEMOLYSIS AT THIS LEVEL MAY AFFECT RESULT  . Alkaline Phosphatase 10/28/2013 121* 39 - 117 U/L Final  . Total Bilirubin 10/28/2013 0.4  0.3 - 1.2 mg/dL Final  . GFR calc non Af Amer 10/28/2013 70* >90 mL/min Final  . GFR calc Af Amer 10/28/2013 81* >90 mL/min  Final   Comment: (NOTE)                          The eGFR has been calculated using the CKD EPI equation.  This calculation has not been validated in all clinical situations.                          eGFR's persistently <90 mL/min signify possible Chronic Kidney                          Disease.  . Anion gap 10/28/2013 15  5 - 15 Final  Admission on 10/14/2013, Discharged on 10/15/2013  Component Date Value Ref Range Status  . Creatinine, Ser 10/14/2013 0.79  0.50 - 1.10 mg/dL Final  . GFR calc non Af Amer 10/14/2013 >90  >90 mL/min Final  . GFR calc Af Amer 10/14/2013 >90  >90 mL/min Final   Comment: (NOTE)                          The eGFR has been calculated using the CKD EPI equation.                          This calculation has not been validated in all clinical situations.                          eGFR's persistently <90 mL/min signify possible Chronic Kidney                          Disease.  . WBC 10/15/2013 11.3* 4.0 - 10.5 K/uL Final  . RBC 10/15/2013 2.95* 3.87 - 5.11 MIL/uL Final  . Hemoglobin 10/15/2013 10.0* 12.0 - 15.0 g/dL Final  . HCT 10/15/2013 30.4* 36.0 - 46.0 % Final  . MCV 10/15/2013 103.1* 78.0 - 100.0 fL Final  . MCH 10/15/2013 33.9  26.0 - 34.0 pg Final  . MCHC 10/15/2013 32.9  30.0 - 36.0 g/dL Final  . RDW 10/15/2013 12.7  11.5 - 15.5 % Final  . Platelets 10/15/2013 249  150 - 400 K/uL Final  Hospital Outpatient Visit on 10/07/2013  Component Date Value Ref Range Status  . WBC 10/07/2013 7.5  4.0 - 10.5 K/uL Final  . RBC 10/07/2013 3.33* 3.87 - 5.11 MIL/uL Final  . Hemoglobin 10/07/2013 11.8* 12.0 - 15.0 g/dL Final  . HCT 10/07/2013 34.5* 36.0 - 46.0 % Final  . MCV 10/07/2013 103.6* 78.0 - 100.0 fL Final  . MCH 10/07/2013 35.4* 26.0 - 34.0 pg Final  . MCHC 10/07/2013 34.2  30.0 - 36.0 g/dL Final  . RDW 10/07/2013 12.7  11.5 - 15.5 % Final  . Platelets 10/07/2013 261  150 - 400 K/uL Final  . Sodium 10/07/2013 133* 137 - 147 mEq/L  Final  . Potassium 10/07/2013 4.7  3.7 - 5.3 mEq/L Final  . Chloride 10/07/2013 92* 96 - 112 mEq/L Final  . CO2 10/07/2013 24  19 - 32 mEq/L Final  . Glucose, Bld 10/07/2013 102* 70 - 99 mg/dL Final  . BUN 10/07/2013 10  6 - 23 mg/dL Final  . Creatinine, Ser 10/07/2013 1.02  0.50 - 1.10 mg/dL Final  . Calcium 10/07/2013 9.2  8.4 - 10.5 mg/dL Final  . GFR calc non Af Amer 10/07/2013 62* >90 mL/min Final  . GFR calc Af Amer 10/07/2013 71* >90 mL/min Final   Comment: (NOTE)  The eGFR has been calculated using the CKD EPI equation.                          This calculation has not been validated in all clinical situations.                          eGFR's persistently <90 mL/min signify possible Chronic Kidney                          Disease.  . Anion gap 10/07/2013 17* 5 - 15 Final    PATHOLOGY:  for ULYANA, PITONES ([id number]) Patient: JERUSHA, REISING Collected: 10/14/2013 Client: [facility] Accession: [id number] Received: 10/15/2013 Ena Dawley DOB: Nov 11, 1960 Age: 89 Gender: F Reported: 10/18/2013 [place] Patient Ph: [phone] MRN #: [id number] [place], [place] [address] Visit #: [id number] Chart #: Phone: [phone] Fax: CC: Ena Dawley, MD REPORT OF SURGICAL PATHOLOGY FINAL DIAGNOSIS Diagnosis Uterus, ovaries and fallopian tubes, with cervix - ADENOMYOSIS. - ENDOMETRIUM: BENIGN ENDOMETRIAL POLYP AND ADJACENT BENIGN WEAKLY PROLIFERATIVE ENDOMETRIUM, NO ATYPIA HYPERPLASIA OR MALIGNANCY. - UTERINE SEROSA: UNREMARKABLE. - CERVIX: BENIGN SQUAMOUS MUCOSA AND ENDOCERVICAL MUCOSA, NO DYSPLASIA OR MALIGNANCY. - BILATERAL OVARIES: BENIGN OVARIAN TISSUE, NO ATYPIA OR MALIGNANCY. - BILATERAL FALLOPIAN TUBES: BENIGN FALLOPIAN TUBAL TISSUE, NO EVIDENCE OF ENDOMETRIOSIS, ATYPIA, OR MALIGNANCY. Aldona Bar MD Pathologist, Electronic Signature (Case signed 10/18/2013) Specimen Gross and Clinical Information Specimen(s)  Obtained: Uterus, ovaries and fallopian tubes, with cervix Specimen Clinical Information postmenopausal bleeding, cystic endometrium (kp) Gross Specimen: Uterus with separate bilateral fallopian tubes and ovary and separate 2 cm portion of soft tan tissue. The specimen is received in formalin. Specimen integrity (intact/incised/disrupted): The uterus is intact. Size and shape: 9.6 x 5.5 x 4.7 cm symmetrical uterus. Weight: 125 grams. Serosa: Smooth and tan. Cervix: The ectocervical mucosa is smooth, tan. The external os is patent. Endometrium: The endometrial cavity measures 5.5 x 3.5 cm. At the fundus there is a 4.5 cm rubbery tan red endometrial polyp. There are two additional endometrial polyps measuring 1 and 1.3 cm in greatest dimension. Myometrium: The myometrium measures up to 2.2 cm in thickness and is unremarkable. 1 of 2 FINAL for DESEREE, ZEMAITIS ([id number]) Gross(continued) Separate adnexa: The ovaries are each approximately the same size measuring 3.5 x 2 x 1.2 cm and are intact. The cortical and cut surfaces are unremarkable. One ovary has an attached segment of tube which measures 4.5 x 0.6 cm. The second separate tube measures 5 x 0.6 cm. Block Summary: Sections are submitted in nine cassettes. A= cervix. B= section of large polyp. C,D= smaller polyps. E= endomyometrium. F,G= sections of ovary and tube (ovary with attached tube). H,I= sections of second ovary and detached tube. (GP:gt, 10/13/13) Report signed out from the following location(s) Technical Component and Interpretation performed at [place], [place], [place]  for [place], [place] [phone]) Patient: AMERIE, BEAUMONT Collected: 10/14/2013 Client: [facility] Accession: [id number] Received: 10/15/2013 Ena Dawley DOB: 1960/06/13 Age: 37 Gender: F Reported: 10/18/2013 [place] Patient Ph: [phone] MRN #: [id number] [place], [place] [address] Visit  #: [id number] Chart #: Phone: [phone] Fax: CC: Ena Dawley, MD REPORT OF SURGICAL PATHOLOGY FINAL DIAGNOSIS Diagnosis Uterus, ovaries and fallopian tubes, with cervix - ADENOMYOSIS. - ENDOMETRIUM: BENIGN ENDOMETRIAL POLYP AND ADJACENT BENIGN WEAKLY PROLIFERATIVE ENDOMETRIUM, NO ATYPIA HYPERPLASIA OR MALIGNANCY. - UTERINE SEROSA: UNREMARKABLE. - CERVIX: BENIGN  SQUAMOUS MUCOSA AND ENDOCERVICAL MUCOSA, NO DYSPLASIA OR MALIGNANCY. - BILATERAL OVARIES: BENIGN OVARIAN TISSUE, NO ATYPIA OR MALIGNANCY. - BILATERAL FALLOPIAN TUBES: BENIGN FALLOPIAN TUBAL TISSUE, NO EVIDENCE OF ENDOMETRIOSIS, ATYPIA, OR MALIGNANCY. Aldona Bar MD Pathologist, Electronic Signature (Case signed 10/18/2013) Specimen Gross and Clinical Information Specimen(s) Obtained: Uterus, ovaries and fallopian tubes, with cervix Specimen Clinical Information postmenopausal bleeding, cystic endometrium (kp) Gross Specimen: Uterus with separate bilateral fallopian tubes and ovary and separate 2 cm portion of soft tan tissue. The specimen is received in formalin. Specimen integrity (intact/incised/disrupted): The uterus is intact. Size and shape: 9.6 x 5.5 x 4.7 cm symmetrical uterus. Weight: 125 grams. Serosa: Smooth and tan. Cervix: The ectocervical mucosa is smooth, tan. The external os is patent. Endometrium: The endometrial cavity measures 5.5 x 3.5 cm. At the fundus there is a 4.5 cm rubbery tan red endometrial polyp. There are two additional endometrial polyps measuring 1 and 1.3 cm in greatest dimension. Myometrium: The myometrium measures up to 2.2 cm in thickness and is unremarkable. 1 of 2 FINAL for NARJIS, MIRA ([id number]) Gross(continued) Separate adnexa: The ovaries are each approximately the same size measuring 3.5 x 2 x 1.2 cm and are intact. The cortical and cut surfaces are unremarkable. One ovary has an attached segment of tube which measures 4.5 x 0.6 cm. The second separate tube  measures 5 x 0.6 cm. Block Summary: Sections are submitted in nine cassettes. A= cervix. B= section of large polyp. C,D= smaller polyps. E= endomyometrium. F,G= sections of ovary and tube (ovary with attached tube). H,I= sections of second ovary and detached tube. (GP:gt, 10/13/13) Report signed out from the following location(s) Technical Component and Interpretation performed at [place], [place], [place]  Urinalysis    Component Value Date/Time   COLORURINE YELLOW 10/26/2013 1942   APPEARANCEUR CLOUDY* 10/26/2013 1942   LABSPEC 1.008 10/26/2013 1942   PHURINE 5.5 10/26/2013 1942   GLUCOSEU NEGATIVE 10/26/2013 1942   HGBUR SMALL* 10/26/2013 1942   BILIRUBINUR NEGATIVE 10/26/2013 1942   KETONESUR NEGATIVE 10/26/2013 1942   PROTEINUR 30* 10/26/2013 1942   UROBILINOGEN 0.2 10/26/2013 1942   NITRITE NEGATIVE 10/26/2013 1942   LEUKOCYTESUR MODERATE* 10/26/2013 1942    RADIOGRAPHIC STUDIES: Ct Abdomen Pelvis W Contrast  10/27/2013   CLINICAL DATA:  Anemia, recent hysterectomy, lower abdominal pressure. Rectal bleeding.  EXAM: CT ABDOMEN AND PELVIS WITH CONTRAST  TECHNIQUE: Multidetector CT imaging of the abdomen and pelvis was performed using the standard protocol following bolus administration of intravenous contrast.  CONTRAST:  86m OMNIPAQUE IOHEXOL 300 MG/ML SOLN, 1031mOMNIPAQUE IOHEXOL 300 MG/ML SOLN  COMPARISON:  None.  FINDINGS: Mild linear opacity is within the right greater than left lower lungs, favor scarring. Normal heart size.  Severe hepatic steatosis.  Lobular hepatic contour.  No appreciable abnormality of the spleen, pancreas, biliary system, adrenal glands, kidneys. No hydroureteronephrosis.  No overt colitis. Normal appendix. Small bowel loops are of normal course and caliber. Tiny fat containing umbilical hernia. No free intraperitoneal air. No lymphadenopathy.  Scattered atherosclerotic disease of the aorta and branch vessels without aneurysmal  dilatation.  Small amount of ill-defined fluid within the pelvis, nonspecific when the recent postoperative state. Nonspecific appearance to the left adnexa with 2.1 and 2.7 cm densities (series 2, image 58 and 62). Per the patient's medical record, status post bilateral oophorectomy. Absent uterus. Thin walled bladder.  Sequelae of left inferior pubic ramus fracture. There is a lytic lesion associated with this on image  77 series 2. Sclerotic focus right iliac bone image 53 and left iliac bone image 50. Rounded sclerotic focus left hemi sacrum series 2, image 51. Sequelae of prior posterior left eleventh rib fracture.  IMPRESSION: Mild stranding and ill-defined fluid within the pelvis is nonspecific and often seen in the recent postoperative state. No large amount of intraperitoneal fluid or contained fluid collection to explain the patient's drop in hemoglobin.  Nonspecific 2.7 cm and 2.1 cm density left adnexa. Per report, post oophorectomy. Correlate with follow-up ultrasound.  Osseous lesions within the pelvis raise concern for metastatic disease. Does the patient have outside imaging to evaluate for stability? If not, consider bone scan.   Electronically Signed   By: Carlos Levering M.D.   On: 10/27/2013 00:41   Nm Myocar Multi W/spect W/wall Motion / Ef  10/12/2013   CLINICAL DATA:  53 year old woman with history of infiltrating ductal carcinoma of the left breast as well as vaginal bleeding, now scheduled for elective hysterectomy. She was noted to have baseline abnormal ECG with hypertension and history of intermittent tobacco use. Also prior history of chest radiation for treatment of breast cancer. This study is requested to evaluate for the presence of ischemia.  EXAM: MYOCARDIAL IMAGING WITH SPECT (REST AND PHARMACOLOGIC-STRESS)  GATED LEFT VENTRICULAR WALL MOTION STUDY  LEFT VENTRICULAR EJECTION FRACTION  TECHNIQUE: Standard myocardial SPECT imaging was performed after resting intravenous injection  of 10 mCi Tc-51msestamibi. Subsequently, intravenous infusion of Lexiscan was performed under the supervision of the Cardiology staff. At peak effect of the drug, 30 mCi Tc-946mestamibi was injected intravenously and standard myocardial SPECT imaging was performed. Quantitative gated imaging was also performed to evaluate left ventricular wall motion, and estimate left ventricular ejection fraction.  FINDINGS: Baseline tracing shows sinus rhythm with nonspecific T-wave abnormalities in the anterior leads. Lexiscan bolus was given in standard fashion. Heart rate increased from 82 beats per min up to 117 beats per min, and blood pressure increased from 117/69 up to 161/96. No chest pain was reported. Patient was anxious and tearful during infusion, complaining of shortness of breath. There were no diagnostic ST segment abnormalities to indicate ischemia, rare PACs noted.  Analysis of the overall perfusion data finds left arm down imaging with breast tissue attenuation, overall adequate radiotracer uptake within the myocardium.  Tomographic views were obtained using the short axis, vertical long axis, and horizontal long axis planes. There is a very small, partially reversible apical anteroseptal defect that is most consistent with variable breast attenuation rather than ischemia.  Gated imaging reveals an EDV of 43, ESV of 8, LVEF of 80%, and TID ratio 0.81. No wall motion abnormalities were noted.  IMPRESSION: Low risk Lexiscan Cardiolite. There were no diagnostic ST segment abnormalities, rare PACs noted. Perfusion imaging is most consistent with variable breast attenuation affecting the apical anteroseptal wall, no large ischemic zones or evidence of scar identified. LVEF calculated at 80% with small LV chamber volumes and normal wall motion.   Electronically Signed   By: SaRozann Lesches.D.   On: 10/12/2013 11:54    ASSESSMENT:  #1. Locally advanced left breast cancer, no evidence of disease. #2.  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 #3. Upper GI bleeding secondary to NSAID gastritis, status post transfusion 2 units of packed red blood cells on 10/27/2013 with last hemoglobin on 10/28/2013 of 8.3, normal ferritin. #4. Left upper extremity lymphedema, worsening. #5. Depression with previous suicidal ideation, no longer evident.  PLAN:  #1. Anastrozole 1 mg daily. #2. If erythropoietin level is less than 500, Procrit will be given to normalize hemoglobin as close to 11 as possible. #3. Referral to lymphedema clinic for definitive management. #4. Followup in 4 weeks with CBC   All questions were answered. The patient knows to call the clinic with any problems, questions or concerns. We can certainly see the patient much sooner if necessary.   I spent 30 minutes counseling the patient face to face. The total time spent in the appointment was 40 minutes.    Doroteo Bradford, MD 11/02/2013 3:08 PM  DISCLAIMER:  This note was dictated with voice recognition software.  Similar sounding words can inadvertently be transcribed inaccurately and may not be corrected upon review.

## 2013-11-02 NOTE — Progress Notes (Signed)
Brittany Blair presented for [employer]. Labs per MD order drawn via Peripheral Line 23 gauge needle inserted in right wrist area (3rd attempt)  Good blood return present. Procedure without incident.  Needle removed intact. Patient tolerated procedure well.

## 2013-11-02 NOTE — Patient Instructions (Signed)
[facility] Discharge Instructions  RECOMMENDATIONS MADE BY THE CONSULTANT AND ANY TEST RESULTS WILL BE SENT TO YOUR REFERRING PHYSICIAN.  EXAM FINDINGS BY THE PHYSICIAN TODAY AND SIGNS OR SYMPTOMS TO REPORT TO CLINIC OR PRIMARY PHYSICIAN: Exam and findings as discussed by Dr. Barnet Glasgow.  Will check some labs today and if your erythropoietin level is below 500 we will start you on a shot to bring your red cell count up. We will also make a referral to Lymphedema clinic to help with the lymphedema of your left arm. Report any new lumps, bone pain, shortness of breath or other symptoms.  MEDICATIONS PRESCRIBED:  Anastrozole - take as directed  INSTRUCTIONS/FOLLOW-UP: Follow-up in 1 month.  Thank you for choosing [facility] to provide your oncology and hematology care.  To afford each patient quality time with our providers, please arrive at least 15 minutes before your scheduled appointment time.  With your help, our goal is to use those 15 minutes to complete the necessary work-up to ensure our physicians have the information they need to help with your evaluation and healthcare recommendations.    Effective January 1st, 2014, we ask that you re-schedule your appointment with our physicians should you arrive 10 or more minutes late for your appointment.  We strive to give you quality time with our providers, and arriving late affects you and other patients whose appointments are after yours.    Again, thank you for choosing [facility].  Our hope is that these requests will decrease the amount of time that you wait before being seen by our physicians.       _____________________________________________________________  Should you have questions after your visit to [facility], please contact our office at [phone] between the hours of 8:30 a.m. and 4:30 p.m.  Voicemails left after 4:30 p.m. will not be returned until the following  business day.  For prescription refill requests, have your pharmacy contact our office with your prescription refill request.    _______________________________________________________________  We hope that we have given you very good care.  You may receive a patient satisfaction survey in the mail, please complete it and return it as soon as possible.  We value your feedback!  _______________________________________________________________  Have you asked about our STAR program?  STAR stands for Survivorship Training and Rehabilitation, and this is a nationally recognized cancer care program that focuses on survivorship and rehabilitation.  Cancer and cancer treatments may cause problems, such as, pain, making you feel tired and keeping you from doing the things that you need or want to do. Cancer rehabilitation can help. Our goal is to reduce these troubling effects and help you have the best quality of life possible.  You may receive a survey from a nurse that asks questions about your current state of health.  Based on the survey results, all eligible patients will be referred to the [facility] program for an evaluation so we can better serve you!  A frequently asked questions sheet is available upon request.  Anastrozole tablets What is this medicine? ANASTROZOLE (an AS troe zole) is used to treat breast cancer in women who have gone through menopause. Some types of breast cancer depend on estrogen to grow, and this medicine can stop tumor growth by blocking estrogen production. This medicine may be used for other purposes; ask your health care provider or pharmacist if you have questions. COMMON BRAND NAME(S): Arimidex What should I tell my  health care provider before I take this medicine? They need to know if you have any of these conditions: -liver disease -an unusual or allergic reaction to anastrozole, other medicines, foods, dyes, or preservatives -pregnant or trying to get  pregnant -breast-feeding How should I use this medicine? Take this medicine by mouth with a glass of water. Follow the directions on the prescription label. You can take this medicine with or without food. Take your doses at regular intervals. Do not take your medicine more often than directed. Do not stop taking except on the advice of your doctor or health care professional. Talk to your pediatrician regarding the use of this medicine in children. Special care may be needed. Overdosage: If you think you have taken too much of this medicine contact a poison control center or emergency room at once. NOTE: This medicine is only for you. Do not share this medicine with others. What if I miss a dose? If you miss a dose, take it as soon as you can. If it is almost time for your next dose, take only that dose. Do not take double or extra doses. What may interact with this medicine? Do not take this medicine with any of the following medications: -female hormones, like estrogens or progestins and birth control pills This medicine may also interact with the following medications: -tamoxifen This list may not describe all possible interactions. Give your health care provider a list of all the medicines, herbs, non-prescription drugs, or dietary supplements you use. Also tell them if you smoke, drink alcohol, or use illegal drugs. Some items may interact with your medicine. What should I watch for while using this medicine? Visit your doctor or health care professional for regular checks on your progress. Let your doctor or health care professional know about any unusual vaginal bleeding. Do not treat yourself for diarrhea, nausea, vomiting or other side effects. Ask your doctor or health care professional for advice. What side effects may I notice from receiving this medicine? Side effects that you should report to your doctor or health care professional as soon as possible: -allergic reactions like skin  rash, itching or hives, swelling of the face, lips, or tongue -any new or unusual symptoms -breathing problems -chest pain -leg pain or swelling -vomiting Side effects that usually do not require medical attention (report to your doctor or health care professional if they continue or are bothersome): -back or bone pain -cough, or throat infection -diarrhea or constipation -dizziness -headache -hot flashes -loss of appetite -nausea -sweating -weakness and tiredness -weight gain This list may not describe all possible side effects. Call your doctor for medical advice about side effects. You may report side effects to FDA at 1-800-FDA-1088. Where should I keep my medicine? Keep out of the reach of children. Store at room temperature between 20 and 25 degrees C (68 and 77 degrees F). Throw away any unused medicine after the expiration date. NOTE: This sheet is a summary. It may not cover all possible information. If you have questions about this medicine, talk to your doctor, pharmacist, or health care provider.  2015, Elsevier/Gold Standard. (2007-04-24 16:31:52)

## 2013-11-03 ENCOUNTER — Telehealth (HOSPITAL_COMMUNITY): Payer: Self-pay

## 2013-11-03 ENCOUNTER — Other Ambulatory Visit (HOSPITAL_COMMUNITY): Payer: Self-pay | Admitting: Hematology and Oncology

## 2013-11-03 DIAGNOSIS — C50912 Malignant neoplasm of unspecified site of left female breast: Secondary | ICD-10-CM

## 2013-11-03 LAB — CANCER ANTIGEN 27.29: CA 27.29: 53 U/mL — ABNORMAL HIGH (ref 0–39)

## 2013-11-03 LAB — CEA: CEA: 4.9 ng/mL (ref 0.0–5.0)

## 2013-11-03 NOTE — Telephone Encounter (Signed)
Patient notified.  Is willing to do bone scan.  Very emotional and upset states "I overheard the GI doctor say something to someone else about the CT scan showing something in my pelvis but he never told me anything. Husband also notified per [place]'s request.

## 2013-11-03 NOTE — Telephone Encounter (Signed)
Patient and husband notified that scan is scheduled for 9/16.  Wants to know if MD will increase strength of xanax to 1 mg.  States that the 0.5mg  is not helping a lot and has been taking 2 tablets each time.

## 2013-11-03 NOTE — Telephone Encounter (Signed)
Patient notified that Dr. Barnet Glasgow prefers that she take 2 of the .05mg  of xanax tablets.  Does not want to prescribe 1 mg tablets.  When low will contact office for refill.  Verbalized understanding of instructions.

## 2013-11-03 NOTE — Telephone Encounter (Signed)
Message copied by Mellissa Kohut on Wed Nov 03, 2013  1:57 PM ------      Message from: [place], [place]: Wed Nov 03, 2013  9:30 AM       Elevated CA 27/29. Bone scan ordered for 11/10/2013 to rule out metastatic disease to pelvic bones based on recent CT result of the abdomen done while the patient was evaluated for GI bleeding, status post TAH/BSO. Please have nurse contact the patient first before scheduling. Thank you ------

## 2013-11-04 LAB — ERYTHROPOIETIN: ERYTHROPOIETIN: 33.9 m[IU]/mL — AB (ref 2.6–18.5)

## 2013-11-10 ENCOUNTER — Encounter (HOSPITAL_COMMUNITY): Payer: Self-pay

## 2013-11-10 ENCOUNTER — Encounter (HOSPITAL_COMMUNITY)
Admission: RE | Admit: 2013-11-10 | Discharge: 2013-11-10 | Disposition: A | Discharge status: Home / Self Care | Payer: MEDICAID | Source: Ambulatory Visit | Attending: Hematology and Oncology | Admitting: Hematology and Oncology

## 2013-11-10 DIAGNOSIS — C50919 Malignant neoplasm of unspecified site of unspecified female breast: Secondary | ICD-10-CM | POA: Insufficient documentation

## 2013-11-10 DIAGNOSIS — M899 Disorder of bone, unspecified: Secondary | ICD-10-CM | POA: Insufficient documentation

## 2013-11-10 DIAGNOSIS — C50912 Malignant neoplasm of unspecified site of left female breast: Secondary | ICD-10-CM

## 2013-11-10 DIAGNOSIS — M949 Disorder of cartilage, unspecified: Principal | ICD-10-CM

## 2013-11-10 MED ORDER — TECHNETIUM TC 99M MEDRONATE IV KIT
25.0000 | PACK | Freq: Once | INTRAVENOUS | Status: AC | PRN
  Administered 2013-11-10: 25 via INTRAVENOUS

## 2013-11-12 ENCOUNTER — Other Ambulatory Visit (HOSPITAL_COMMUNITY): Payer: Self-pay | Admitting: Oncology

## 2013-11-12 DIAGNOSIS — C50312 Malignant neoplasm of lower-inner quadrant of left female breast: Secondary | ICD-10-CM

## 2013-11-12 MED ORDER — HYDROCODONE-ACETAMINOPHEN 5-325 MG PO TABS: 1.0000 | ORAL_TABLET | ORAL | Status: DC | PRN

## 2013-11-23 ENCOUNTER — Other Ambulatory Visit (HOSPITAL_COMMUNITY): Payer: Self-pay | Admitting: Hematology and Oncology

## 2013-11-23 ENCOUNTER — Telehealth (HOSPITAL_COMMUNITY): Payer: Self-pay

## 2013-11-23 DIAGNOSIS — C50919 Malignant neoplasm of unspecified site of unspecified female breast: Secondary | ICD-10-CM

## 2013-11-23 NOTE — Telephone Encounter (Signed)
Spoke with Brittany Blair and her husband.  Results of bone scan were discussed and need for CT scan of chest also discussed.  Both are willing to proceed with CT of chest and to see PA in the afternoon after the scan.  Will get everything scheduled in the am and will call husband with dates and times of appointments.

## 2013-11-24 ENCOUNTER — Telehealth (HOSPITAL_COMMUNITY): Payer: Self-pay

## 2013-11-24 NOTE — Telephone Encounter (Signed)
Patient unable to come for appointments on 10/5 and rescheduled for 11/30/13.  CT of chest will be done @ 11:00am and office visit will be @ 3:30pm.  Husband notified and verbalized understanding of instructions.

## 2013-11-25 HISTORY — PX: BONE BIOPSY: SHX375

## 2013-11-26 ENCOUNTER — Telehealth (HOSPITAL_COMMUNITY): Payer: Self-pay

## 2013-11-26 ENCOUNTER — Other Ambulatory Visit (HOSPITAL_COMMUNITY): Payer: Self-pay | Admitting: Oncology

## 2013-11-26 DIAGNOSIS — C50312 Malignant neoplasm of lower-inner quadrant of left female breast: Secondary | ICD-10-CM

## 2013-11-26 MED ORDER — HYDROCODONE-ACETAMINOPHEN 5-325 MG PO TABS
1.0000 | ORAL_TABLET | ORAL | Status: DC | PRN
Start: 2013-11-26 — End: 2013-12-06

## 2013-11-26 NOTE — Telephone Encounter (Signed)
Husband notified that prescription for Hydrocodone is ready for pickup.

## 2013-11-26 NOTE — Telephone Encounter (Signed)
Ready for pick up

## 2013-11-26 NOTE — Telephone Encounter (Signed)
Call from patient requesting refill for Vicodin.  Only has 2 pills left.  Gets med from [facility] and will need to get filled today as pharmacy is closed on the weekend.

## 2013-11-28 NOTE — Progress Notes (Signed)
No PCP Per Patient No address on file  Cancer of lower-inner quadrant of female breast, left - Plan: CT Biopsy  Elevated tumor markers - Plan: CT Biopsy  Abnormal finding on imaging - Plan: CT Biopsy  GAD (generalized anxiety disorder) - Plan: ALPRAZolam (XANAX) 1 MG tablet  CURRENT THERAPY: work-up for recurrent, metastatic disease, presumed to be breast cancer.  Biopsy needed.  INTERVAL HISTORY: Brittany Blair 53 y.o. female returns for  regular  visit for followup of abnormal imaging studies following discovery of an elevated CA27.29 after a CT of abd/pelvis was performed demonstrating worrisome findings for osseous metastatic disease.    Previously, patient was being seen for follow-up of a stage III-A left breast cancer with definitive therapy on 10/10/2010 who developed vaginal bleeding on tamoxifen 10 mg daily and ultimately underwent TAH/BSO on 10/14/2013 at which time proliferative endometrium and endometrial polyps were found but no evidence of invasive malignancy. She is here today to discuss further endocrine therapy with a history of extremely poor tolerance of anastrozole.   I personally reviewed and went over radiographic studies with the patient.  The results are noted within this dictation.  CT of chest performed today demonstrates: 1. Status post left mastectomy and axillary node dissection. No  evidence of recurrent or metastatic disease within the chest.  2. Left fifth and sixth rib deformities, posttraumatic. The  posterior eleventh left rib abnormality is technically  indeterminate.  3. Hepatic steatosis.  With this information, I have placed an order for a CT guided biopsy of pelvic bone lesion for diagnosis and treatment guidance.  I reviewed how this is performed in a very general sense.  The left inferior pubic ramus may be a good area for biopsy, but I will defer decision making to IR.    She asked about treatment and staging and I reported to her that it  will depend on biopsy results.  If this is breast cancer, she is Stage IV and I have advised her of that.  I provided her education regarding Stage Iv cancer.  HOWEVER, to definitively stage, we need a biopsy because if this is not breast cancer, staging is different.  Patient is educated of this.  I have deferred any treatment options until after biopsy.  She denies any oncology complaints and ROS questioning is negative.  Past Medical History  Diagnosis Date  . Anxiety   . Herpes   . MRSA (methicillin resistant Staphylococcus aureus)   . Breast lump     left  . Hypertension   . Hematoma   . Swelling     left foot  . Multiple blisters     along surgical site  . Depression   . Genital herpes 08/04/2012  . PMB (postmenopausal bleeding) 06/23/2013  . Lymph edema   . Vaginal delivery 1981, 1984, 1987, 1992  . Breast cancer dx'd 05/27/2010    chemo/xrt comp 01/2011    has Breast Cancer, IDC,Left, central, Stage III, receptor + Her2 -; Syncope; GAD (generalized anxiety disorder); HTN (hypertension), benign; Chronic chest pain; Depression; Transaminitis; Genital herpes; PMB (postmenopausal bleeding); Post-menopausal bleeding; Acute blood loss anemia; Abdominal pain; AKI (acute kidney injury); GIB (gastrointestinal bleeding); Syncope and collapse; and Prepyloric ulcer on her problem list.     is allergic to bee venom.  We administered Influenza vac split quadrivalent PF.  Past Surgical History  Procedure Laterality Date  . Tubal ligation    . Wisdom tooth extraction    . Mastectomy modified  radical  10/10/10     left -Dr Margot Chimes  . Laparoscopic assisted vaginal hysterectomy Bilateral 10/14/2013    Procedure: LAPAROSCOPIC ASSISTED VAGINAL HYSTERECTOMY;  Surgeon: Ena Dawley, MD;  Location: [facility];  Service: Gynecology;  Laterality: Bilateral;  . Cystoscopy N/A 10/14/2013    Procedure: CYSTOSCOPY;  Surgeon: Ena Dawley, MD;  Location: [facility];  Service: Gynecology;  Laterality: N/A;    . Laparoscopic lysis of adhesions N/A 10/14/2013    Procedure: LAPAROSCOPIC LYSIS OF ADHESIONS;  Surgeon: Ena Dawley, MD;  Location: [facility];  Service: Gynecology;  Laterality: N/A;  . Laparoscopic bilateral salpingo oopherectomy Bilateral 10/14/2013    Procedure: LAPAROSCOPIC BILATERAL SALPINGO OOPHORECTOMY;  Surgeon: Ena Dawley, MD;  Location: [facility];  Service: Gynecology;  Laterality: Bilateral;  . Esophagogastroduodenoscopy N/A 10/27/2013    Procedure: ESOPHAGOGASTRODUODENOSCOPY (EGD);  Surgeon: Wonda Horner, MD;  Location: [facility];  Service: Endoscopy;  Laterality: N/A;    Denies any headaches, dizziness, double vision, fevers, chills, night sweats, nausea, vomiting, diarrhea, constipation, chest pain, heart palpitations, shortness of breath, blood in stool, black tarry stool, urinary pain, urinary burning, urinary frequency, hematuria.   PHYSICAL EXAMINATION  ECOG PERFORMANCE STATUS: 1 - Symptomatic but completely ambulatory  Filed Vitals:   11/30/13 1221  BP: 104/65  Pulse: 96  Temp: 97.9 F (36.6 C)  Resp: 18    GENERAL:alert, no distress, well nourished, well developed, comfortable, cooperative, obese and smiling SKIN: skin color, texture, turgor are normal, no rashes or significant lesions HEAD: Normocephalic, No masses, lesions, tenderness or abnormalities EYES: normal, PERRLA, EOMI, Conjunctiva are pink and non-injected EARS: External ears normal OROPHARYNX:mucous membranes are moist  NECK: supple, trachea midline LYMPH:  not examined BREAST:not examined LUNGS: not examined HEART: not examined ABDOMEN:not examined BACK: Back symmetric, no curvature. EXTREMITIES:less then 2 second capillary refill, no skin discoloration  NEURO: alert & oriented x 3 with fluent speech, no focal motor/sensory deficits, gait normal   LABORATORY DATA: CBC    Component Value Date/Time   WBC 7.0 11/02/2013 1506   WBC 7.1 01/24/2012 1539   RBC 3.48* 11/02/2013 1506   RBC  3.00* 10/27/2013 0800   RBC 4.39 01/24/2012 1539   HGB 11.1* 11/02/2013 1506   HGB 14.7 01/24/2012 1539   HCT 35.0* 11/02/2013 1506   HCT 43.7 01/24/2012 1539   PLT 592* 11/02/2013 1506   PLT 258 01/24/2012 1539   MCV 100.6* 11/02/2013 1506   MCV 99.5 01/24/2012 1539   MCH 31.9 11/02/2013 1506   MCH 33.5 01/24/2012 1539   MCHC 31.7 11/02/2013 1506   MCHC 33.6 01/24/2012 1539   RDW 16.7* 11/02/2013 1506   RDW 14.1 01/24/2012 1539   LYMPHSABS 1.2 11/02/2013 1506   LYMPHSABS 1.4 01/24/2012 1539   MONOABS 0.9 11/02/2013 1506   MONOABS 0.6 01/24/2012 1539   EOSABS 0.7 11/02/2013 1506   EOSABS 0.3 01/24/2012 1539   BASOSABS 0.1 11/02/2013 1506   BASOSABS 0.0 01/24/2012 1539      Chemistry      Component Value Date/Time   NA 139 11/02/2013 1506   K 4.2 11/02/2013 1506   CL 99 11/02/2013 1506   CO2 23 11/02/2013 1506   BUN 4* 11/02/2013 1506   CREATININE 0.74 11/02/2013 1506      Component Value Date/Time   CALCIUM 8.5 11/02/2013 1506   ALKPHOS 162* 11/02/2013 1506   AST 94* 11/02/2013 1506   ALT 43* 11/02/2013 1506   BILITOT 0.4 11/02/2013 1506     Lab Results  Component Value Date   LABCA2 53* 11/02/2013     RADIOGRAPHIC STUDIES:  11/30/2013  CLINICAL DATA: Abnormal bone scan; left breast ca 05-2010  semi-radial mastectomy/chemo/rad tx; new diagnosis of bone mets to  pelvis; restaging; pt states she has lymphedema to left arm.  Metastatic disease in bone, rule out lung metastases restaging  Subsequent encounter.  EXAM:  CT CHEST WITH CONTRAST  TECHNIQUE:  Multidetector CT imaging of the chest was performed during  intravenous contrast administration.  CONTRAST: 60m OMNIPAQUE IOHEXOL 300 MG/ML SOLN  COMPARISON: Bone scan 11/10/2013. Abdominal pelvic CT of  10/27/2013.  FINDINGS:  Lungs/Pleura: Minimal subpleural right lower lobe thickening or  scarring.  Patchy left apical interstitial thickening which is likely radiation  induced.  No pleural fluid.  Heart/Mediastinum: No supraclavicular  adenopathy. Left axillary node  dissection and mastectomy. No axillary adenopathy.  Normal heart size, without pericardial effusion. No central  pulmonary embolism, on this non-dedicated study. No mediastinal or  hilar adenopathy. No internal mammary adenopathy.  Upper Abdomen: Marked hepatic steatosis with incompletely imaged  hepatomegaly.  Normal imaged portions of the spleen, stomach, pancreas, adrenal  glands, gallbladder, kidneys.  Bones/Musculoskeletal: Posterior eleventh left rib deformity is  likely due to remote trauma. There also healed fractures of the  sixth and fifth lateral left ribs, posttraumatic. Left-sided T6  hemangioma.  IMPRESSION:  1. Status post left mastectomy and axillary node dissection. No  evidence of recurrent or metastatic disease within the chest.  2. Left fifth and sixth rib deformities, posttraumatic. The  posterior eleventh left rib abnormality is technically  indeterminate.  3. Hepatic steatosis.  Electronically Signed  By: KAbigail MiyamotoM.D.  On: 11/30/2013 13:28     ASSESSMENT:  1. Suspected recurrent metastatic breast cancer, S/P staging scans and now in need of biopsy to prove diagnosis and guide future treatment options. 2. Osseous lesions within the pelvis per CT and bone scan 3. S/P TAH-BSO on 10/14/2013 due to vaginal bleeding while on Tamoxifen, negative pathology.  Complicated by GI bleed requiring hospitalization and 2 unit PRBCS, discharged on 10/28/2013. 4. Infiltrating ductal carcinoma the left breast, grade 3, stage IIIA (probably downstaged from IIIB following neoadjuvant chemotherapy described below). She was given Taxotere x3 cycles preoperatively in a dose dense fashion followed by 2 cycles of dose dense FEC. Her chemotherapy was interrupted because of the severe MRSA infection prompting discontinuation. At the time of definitive surgery on 10/10/2010 she still had a 4.5 cm cancer with LV I and 7 of 8 positive lymph nodes. ER receptors  61% positive, PR receptors 100% positive, HER-2/neu nonamplified, Ki-67 are high at 75%. She was started on Arimidex but was intolerant to the medication with severe side effects. Then on 05/22/2012, she was transitioned to Tamoxifen. She has tolerated that well thus far until recently she started to have vaginal bleeding with clots. She was referred to a local gynecologist who performed an endometrial biopsy on 06/28/2013 that was negative for malignancy following a PAP smear on 06/23/2013 (that was negative as well). Underwent TAH/BSO on 10/14/2013 with evidence of proliferative endometrium and polyps with no evidence of invasive malignancy.  Patient Active Problem List   Diagnosis Date Noted  . GIB (gastrointestinal bleeding) 10/27/2013  . Syncope and collapse 10/27/2013  . Prepyloric ulcer 10/27/2013  . Acute blood loss anemia 10/26/2013  . Abdominal pain 10/26/2013  . AKI (acute kidney injury) 10/26/2013  . Post-menopausal bleeding 10/14/2013  . PMB (postmenopausal bleeding) 06/23/2013  . Genital herpes 08/04/2012  .  Syncope 04/28/2012  . GAD (generalized anxiety disorder) 04/28/2012  . HTN (hypertension), benign 04/28/2012  . Chronic chest pain 04/28/2012  . Depression 04/28/2012  . Transaminitis 04/28/2012  . Breast Cancer, IDC,Left, central, Stage III, receptor + Her2 - 05/28/2010    Class: Stage 3      PLAN:  1. I personally reviewed and went over laboratory results with the patient.  The results are noted within this dictation. 2. I personally reviewed and went over radiographic studies with the patient.  The results are noted within this dictation.  3. Oncology history reviewed 4. Rx for Acyclovir refilled and called in to [facility] 5. Change in Xanax to 1 mg tablets #90 TID PRN with 3 refills and called in to [facility]. 6. CT guided biopsy order placed of left inferior pubic ramus or other pelvic bone lesion(s) for diagnosis and treatment guidance.   7. Influenza vaccine  done today 8. Return in 2 weeks for follow-up   THERAPY PLAN:  Following biopsy results we will discuss stage of malignancy and treatment options with the patient in 2 weeks.  All questions were answered. The patient knows to call the clinic with any problems, questions or concerns. We can certainly see the patient much sooner if necessary.  Patient and plan discussed with Dr. Nelida Meuse and he is in agreement with the aforementioned.   Brittany Blair 11/30/2013

## 2013-11-29 ENCOUNTER — Ambulatory Visit (HOSPITAL_COMMUNITY): Payer: Self-pay | Admitting: Oncology

## 2013-11-30 ENCOUNTER — Encounter (HOSPITAL_COMMUNITY): Payer: Self-pay | Attending: Oncology | Admitting: Oncology

## 2013-11-30 ENCOUNTER — Ambulatory Visit (HOSPITAL_COMMUNITY): Payer: Self-pay | Admitting: Oncology

## 2013-11-30 ENCOUNTER — Ambulatory Visit (HOSPITAL_COMMUNITY)
Admission: RE | Admit: 2013-11-30 | Discharge: 2013-11-30 | Disposition: A | Discharge status: Home / Self Care | Payer: Self-pay | Source: Ambulatory Visit | Attending: Hematology and Oncology | Admitting: Hematology and Oncology

## 2013-11-30 ENCOUNTER — Encounter (HOSPITAL_COMMUNITY): Payer: Self-pay | Admitting: Oncology

## 2013-11-30 ENCOUNTER — Other Ambulatory Visit (HOSPITAL_COMMUNITY): Payer: Self-pay | Admitting: Oncology

## 2013-11-30 VITALS — BP 104/65 | HR 96 | Temp 97.9°F | Resp 18 | Wt 187.5 lb

## 2013-11-30 DIAGNOSIS — Z923 Personal history of irradiation: Secondary | ICD-10-CM | POA: Insufficient documentation

## 2013-11-30 DIAGNOSIS — C7951 Secondary malignant neoplasm of bone: Secondary | ICD-10-CM | POA: Insufficient documentation

## 2013-11-30 DIAGNOSIS — Z23 Encounter for immunization: Secondary | ICD-10-CM

## 2013-11-30 DIAGNOSIS — K760 Fatty (change of) liver, not elsewhere classified: Secondary | ICD-10-CM | POA: Insufficient documentation

## 2013-11-30 DIAGNOSIS — M899 Disorder of bone, unspecified: Secondary | ICD-10-CM

## 2013-11-30 DIAGNOSIS — Z9221 Personal history of antineoplastic chemotherapy: Secondary | ICD-10-CM | POA: Insufficient documentation

## 2013-11-30 DIAGNOSIS — R978 Other abnormal tumor markers: Secondary | ICD-10-CM | POA: Insufficient documentation

## 2013-11-30 DIAGNOSIS — R9389 Abnormal findings on diagnostic imaging of other specified body structures: Secondary | ICD-10-CM

## 2013-11-30 DIAGNOSIS — I890 Lymphedema, not elsewhere classified: Secondary | ICD-10-CM | POA: Insufficient documentation

## 2013-11-30 DIAGNOSIS — F411 Generalized anxiety disorder: Secondary | ICD-10-CM | POA: Insufficient documentation

## 2013-11-30 DIAGNOSIS — I10 Essential (primary) hypertension: Secondary | ICD-10-CM

## 2013-11-30 DIAGNOSIS — C50312 Malignant neoplasm of lower-inner quadrant of left female breast: Secondary | ICD-10-CM | POA: Insufficient documentation

## 2013-11-30 DIAGNOSIS — R938 Abnormal findings on diagnostic imaging of other specified body structures: Secondary | ICD-10-CM | POA: Insufficient documentation

## 2013-11-30 DIAGNOSIS — Z853 Personal history of malignant neoplasm of breast: Secondary | ICD-10-CM

## 2013-11-30 DIAGNOSIS — C50912 Malignant neoplasm of unspecified site of left female breast: Secondary | ICD-10-CM | POA: Insufficient documentation

## 2013-11-30 DIAGNOSIS — C50919 Malignant neoplasm of unspecified site of unspecified female breast: Secondary | ICD-10-CM

## 2013-11-30 DIAGNOSIS — R918 Other nonspecific abnormal finding of lung field: Secondary | ICD-10-CM | POA: Insufficient documentation

## 2013-11-30 MED ORDER — ALPRAZOLAM 1 MG PO TABS: 1.0000 mg | ORAL_TABLET | Freq: Three times a day (TID) | ORAL | Status: DC

## 2013-11-30 MED ORDER — INFLUENZA VAC SPLIT QUAD 0.5 ML IM SUSY
0.5000 mL | PREFILLED_SYRINGE | Freq: Once | INTRAMUSCULAR | Status: AC
  Administered 2013-11-30: 0.5 mL via INTRAMUSCULAR
  Filled 2013-11-30: qty 0.5

## 2013-11-30 MED ORDER — IOHEXOL 300 MG/ML  SOLN
80.0000 mL | Freq: Once | INTRAMUSCULAR | Status: AC | PRN
  Administered 2013-11-30: 80 mL via INTRAVENOUS

## 2013-11-30 NOTE — Progress Notes (Signed)
Brittany Blair presents today for injection per MD orders. Flu vaccine administered IM in right Upper Arm. Administration without incident. Patient tolerated well.

## 2013-11-30 NOTE — Patient Instructions (Signed)
[facility] Discharge Instructions  RECOMMENDATIONS MADE BY THE CONSULTANT AND ANY TEST RESULTS WILL BE SENT TO YOUR REFERRING PHYSICIAN.  EXAM FINDINGS BY THE PHYSICIAN TODAY AND SIGNS OR SYMPTOMS TO REPORT TO CLINIC OR PRIMARY PHYSICIAN: Exam and findings as discussed by [name], PA-C.  Need to get biopsy done of one of the areas seen on the CT and bone scan to determine exactly what is going on.  Once we know this we can determine what needs to be done next.  MEDICATIONS PRESCRIBED:  Refills for acyclovir, xanax and Hydrocodone  INSTRUCTIONS/FOLLOW-UP: Follow-up after the biopsy.  Thank you for choosing [facility] to provide your oncology and hematology care.  To afford each patient quality time with our providers, please arrive at least 15 minutes before your scheduled appointment time.  With your help, our goal is to use those 15 minutes to complete the necessary work-up to ensure our physicians have the information they need to help with your evaluation and healthcare recommendations.    Effective January 1st, 2014, we ask that you re-schedule your appointment with our physicians should you arrive 10 or more minutes late for your appointment.  We strive to give you quality time with our providers, and arriving late affects you and other patients whose appointments are after yours.    Again, thank you for choosing [facility].  Our hope is that these requests will decrease the amount of time that you wait before being seen by our physicians.       _____________________________________________________________  Should you have questions after your visit to [facility], please contact our office at [phone] between the hours of 8:30 a.m. and 4:30 p.m.  Voicemails left after 4:30 p.m. will not be returned until the following business day.  For prescription refill requests, have your pharmacy contact our office with your  prescription refill request.    _______________________________________________________________  We hope that we have given you very good care.  You may receive a patient satisfaction survey in the mail, please complete it and return it as soon as possible.  We value your feedback!  _______________________________________________________________  Have you asked about our STAR program?  STAR stands for Survivorship Training and Rehabilitation, and this is a nationally recognized cancer care program that focuses on survivorship and rehabilitation.  Cancer and cancer treatments may cause problems, such as, pain, making you feel tired and keeping you from doing the things that you need or want to do. Cancer rehabilitation can help. Our goal is to reduce these troubling effects and help you have the best quality of life possible.  You may receive a survey from a nurse that asks questions about your current state of health.  Based on the survey results, all eligible patients will be referred to the [facility] program for an evaluation so we can better serve you!  A frequently asked questions sheet is available upon request.

## 2013-12-02 ENCOUNTER — Ambulatory Visit (HOSPITAL_COMMUNITY): Payer: Self-pay

## 2013-12-06 ENCOUNTER — Telehealth (HOSPITAL_COMMUNITY): Payer: Self-pay | Admitting: Oncology

## 2013-12-06 ENCOUNTER — Other Ambulatory Visit (HOSPITAL_COMMUNITY): Payer: Self-pay | Admitting: Oncology

## 2013-12-06 ENCOUNTER — Other Ambulatory Visit: Payer: Self-pay | Admitting: Radiology

## 2013-12-06 DIAGNOSIS — C50312 Malignant neoplasm of lower-inner quadrant of left female breast: Secondary | ICD-10-CM

## 2013-12-06 MED ORDER — HYDROCODONE-ACETAMINOPHEN 5-325 MG PO TABS: 1.0000 | ORAL_TABLET | ORAL | Status: DC | PRN

## 2013-12-07 ENCOUNTER — Other Ambulatory Visit: Payer: Self-pay | Admitting: Radiology

## 2013-12-07 ENCOUNTER — Encounter (HOSPITAL_COMMUNITY): Payer: Self-pay | Admitting: Pharmacy Technician

## 2013-12-08 ENCOUNTER — Ambulatory Visit (HOSPITAL_COMMUNITY)
Admission: RE | Admit: 2013-12-08 | Discharge: 2013-12-08 | Disposition: A | Discharge status: Home / Self Care | Payer: Self-pay | Source: Ambulatory Visit | Attending: Oncology | Admitting: Oncology

## 2013-12-08 ENCOUNTER — Encounter (HOSPITAL_COMMUNITY): Payer: Self-pay

## 2013-12-08 DIAGNOSIS — M899 Disorder of bone, unspecified: Secondary | ICD-10-CM | POA: Insufficient documentation

## 2013-12-08 DIAGNOSIS — C50312 Malignant neoplasm of lower-inner quadrant of left female breast: Secondary | ICD-10-CM | POA: Insufficient documentation

## 2013-12-08 DIAGNOSIS — R978 Other abnormal tumor markers: Secondary | ICD-10-CM

## 2013-12-08 DIAGNOSIS — R9389 Abnormal findings on diagnostic imaging of other specified body structures: Secondary | ICD-10-CM

## 2013-12-08 DIAGNOSIS — R938 Abnormal findings on diagnostic imaging of other specified body structures: Secondary | ICD-10-CM | POA: Insufficient documentation

## 2013-12-08 LAB — CBC
HCT: 36 % (ref 36.0–46.0)
Hemoglobin: 12 g/dL (ref 12.0–15.0)
MCH: 33.4 pg (ref 26.0–34.0)
MCHC: 33.3 g/dL (ref 30.0–36.0)
MCV: 100.3 fL — ABNORMAL HIGH (ref 78.0–100.0)
PLATELETS: 285 10*3/uL (ref 150–400)
RBC: 3.59 MIL/uL — ABNORMAL LOW (ref 3.87–5.11)
RDW: 15.8 % — AB (ref 11.5–15.5)
WBC: 5.2 10*3/uL (ref 4.0–10.5)

## 2013-12-08 LAB — PROTIME-INR
INR: 0.92 (ref 0.00–1.49)
Prothrombin Time: 12.5 seconds (ref 11.6–15.2)

## 2013-12-08 LAB — APTT: APTT: 30 s (ref 24–37)

## 2013-12-08 MED ORDER — FENTANYL CITRATE 0.05 MG/ML IJ SOLN
INTRAMUSCULAR | Status: AC | PRN
  Administered 2013-12-08 (×3): 50 ug via INTRAVENOUS

## 2013-12-08 MED ORDER — HYDROCODONE-ACETAMINOPHEN 5-325 MG PO TABS
1.0000 | ORAL_TABLET | ORAL | Status: DC | PRN
  Filled 2013-12-08: qty 2

## 2013-12-08 MED ORDER — LIDOCAINE HCL 1 % IJ SOLN
INTRAMUSCULAR | Status: AC
  Filled 2013-12-08: qty 20

## 2013-12-08 MED ORDER — FENTANYL CITRATE 0.05 MG/ML IJ SOLN
INTRAMUSCULAR | Status: AC
  Filled 2013-12-08: qty 4

## 2013-12-08 MED ORDER — SODIUM CHLORIDE 0.9 % IV SOLN: INTRAVENOUS | Status: DC

## 2013-12-08 MED ORDER — MIDAZOLAM HCL 2 MG/2ML IJ SOLN
INTRAMUSCULAR | Status: AC | PRN
  Administered 2013-12-08 (×3): 1 mg via INTRAVENOUS

## 2013-12-08 MED ORDER — MIDAZOLAM HCL 2 MG/2ML IJ SOLN
INTRAMUSCULAR | Status: AC
  Filled 2013-12-08: qty 4

## 2013-12-08 NOTE — Sedation Documentation (Signed)
Pt yelling out in pain w/ manipulation of needle.  Order given for more sedation meds per Dr. Anselm Pancoast.

## 2013-12-08 NOTE — H&P (Signed)
Chief Complaint: Left hip pain Hx breast cancer New bony mets on CT  Referring Physician(s): Kefalas,Thomas S  History of Present Illness: Brittany Blair is a 53 y.o. female  Pt diagnosed with L Breast Ca 2012 Noted L hip pain at least 1 yr Continues---almost worsening Upon recent routine follow up pt was noted to have elevated tumor marker blood work Bone scan and CT revealed bony mets--Left ilium lesion Now scheduled for L ilum lesion biopsy  Past Medical History  Diagnosis Date  . Anxiety   . Herpes   . MRSA (methicillin resistant Staphylococcus aureus)   . Breast lump     left  . Hypertension   . Hematoma   . Swelling     left foot  . Multiple blisters     along surgical site  . Depression   . Genital herpes 08/04/2012  . PMB (postmenopausal bleeding) 06/23/2013  . Lymph edema   . Vaginal delivery 1981, 1984, 1987, 1992  . Breast cancer dx'd 05/27/2010    chemo/xrt comp 01/2011    Past Surgical History  Procedure Laterality Date  . Tubal ligation    . Wisdom tooth extraction    . Mastectomy modified radical  10/10/10     left -Dr Margot Chimes  . Laparoscopic assisted vaginal hysterectomy Bilateral 10/14/2013    Procedure: LAPAROSCOPIC ASSISTED VAGINAL HYSTERECTOMY;  Surgeon: Ena Dawley, MD;  Location: [facility];  Service: Gynecology;  Laterality: Bilateral;  . Cystoscopy N/A 10/14/2013    Procedure: CYSTOSCOPY;  Surgeon: Ena Dawley, MD;  Location: [facility];  Service: Gynecology;  Laterality: N/A;  . Laparoscopic lysis of adhesions N/A 10/14/2013    Procedure: LAPAROSCOPIC LYSIS OF ADHESIONS;  Surgeon: Ena Dawley, MD;  Location: [facility];  Service: Gynecology;  Laterality: N/A;  . Laparoscopic bilateral salpingo oopherectomy Bilateral 10/14/2013    Procedure: LAPAROSCOPIC BILATERAL SALPINGO OOPHORECTOMY;  Surgeon: Ena Dawley, MD;  Location: [facility];  Service: Gynecology;  Laterality: Bilateral;  . Esophagogastroduodenoscopy N/A 10/27/2013    Procedure:  ESOPHAGOGASTRODUODENOSCOPY (EGD);  Surgeon: Wonda Horner, MD;  Location: [facility];  Service: Endoscopy;  Laterality: N/A;    Allergies: Bee venom  Medications: Prior to Admission medications   Medication Sig Start Date End Date Taking? Authorizing Provider  acyclovir (ZOVIRAX) 400 MG tablet Take 400 mg by mouth daily.   Yes Historical Provider, MD  ALPRAZolam Duanne Moron) 1 MG tablet Take 1 tablet (1 mg total) by mouth 3 (three) times daily. 11/30/13  Yes Manon Hilding Kefalas, PA-C  amLODipine (NORVASC) 5 MG tablet Take 5 mg by mouth daily.   Yes Historical Provider, MD  clotrimazole (LOTRIMIN) 1 % cream Apply 1 application topically 2 (two) times daily as needed (yeast rash).   Yes Historical Provider, MD  HYDROcodone-acetaminophen (NORCO/VICODIN) 5-325 MG per tablet Take 1 tablet by mouth every 4 (four) hours as needed (pain). 12/06/13  Yes Manon Hilding Kefalas, PA-C  lisinopril (PRINIVIL,ZESTRIL) 40 MG tablet Take 40 mg by mouth daily.   Yes Historical Provider, MD  loratadine (CLARITIN) 10 MG tablet Take 10 mg by mouth daily as needed for allergies.    Yes Historical Provider, MD  metoprolol succinate (TOPROL-XL) 25 MG 24 hr tablet Take 75 mg by mouth daily.    Yes Historical Provider, MD  polyethylene glycol (MIRALAX / GLYCOLAX) packet Take 8.5 g by mouth daily as needed for moderate constipation.   Yes Historical Provider, MD  tetrahydrozoline-zinc (VISINE-AC) 0.05-0.25 % ophthalmic solution Place 2 drops into both eyes 3 (three)  times daily as needed (for allergies).    Yes Historical Provider, MD  traZODone (DESYREL) 50 MG tablet Take 1 tablet (50 mg total) by mouth at bedtime. 08/24/13  Yes Farrel Gobble, MD  venlafaxine XR (EFFEXOR-XR) 75 MG 24 hr capsule Take 225 mg by mouth every evening.   Yes Historical Provider, MD    Family History  Problem Relation Age of Onset  . Heart disease Father     heart attack  . Hypertension Daughter   . Hypertension Son   . Cancer Maternal Grandmother      ovarian  . Hypertension Maternal Grandfather   . Cancer Paternal Grandmother     ovarian  . Hypertension Paternal Grandfather     History   Social History  . Marital Status: Married    Spouse Name: N/A    Number of Children: N/A  . Years of Education: N/A   Social History Main Topics  . Smoking status: Current Every Day Smoker -- 0.35 packs/day    Types: Cigarettes    Last Attempt to Quit: 09/26/2013  . Smokeless tobacco: Never Used  . Alcohol Use: 12.6 oz/week    21 Glasses of wine per week  . Drug Use: No  . Sexual Activity: Not Currently    Birth Control/ Protection: None   Other Topics Concern  . None   Social History Narrative  . None    Review of Systems: A 12 point ROS discussed and pertinent positives are indicated in the HPI above.  All other systems are negative.  Review of Systems  Constitutional: Positive for activity change. Negative for fever, appetite change and unexpected weight change.  Respiratory: Negative for chest tightness and shortness of breath.   Cardiovascular: Negative for chest pain.  Gastrointestinal: Negative for abdominal pain.  Genitourinary: Negative for difficulty urinating.  Musculoskeletal: Positive for gait problem.       Left hip pain  Skin: Negative for color change.  Neurological: Positive for weakness. Negative for seizures, light-headedness and headaches.  Psychiatric/Behavioral: Negative for behavioral problems and confusion.    Vital Signs: BP 108/76  Pulse 86  Temp(Src) 97.9 F (36.6 C) (Oral)  Resp 18  Ht 5\' 4"  (1.626 m)  Wt 82.555 kg (182 lb)  BMI 31.22 kg/m2  SpO2 100%  LMP 05/29/2013  Physical Exam  Constitutional: She is oriented to person, place, and time. She appears well-nourished.  Cardiovascular: Normal rate and regular rhythm.   No murmur heard. Pulmonary/Chest: Effort normal and breath sounds normal. She has no wheezes.  Abdominal: Soft. Bowel sounds are normal. She exhibits no distension.  There is no tenderness.  Musculoskeletal: Normal range of motion.  L hip pain with continued walking  Neurological: She is alert and oriented to person, place, and time.  Skin: Skin is warm and dry.  Psychiatric: She has a normal mood and affect. Her behavior is normal. Judgment and thought content normal.    Imaging: Ct Chest W Contrast  11/30/2013   CLINICAL DATA:  Abnormal bone scan; left breast ca 05-2010 semi-radial mastectomy/chemo/rad tx; new diagnosis of bone mets to pelvis; restaging; pt states she has lymphedema to left arm. Metastatic disease in bone, rule out lung metastases restaging Subsequent encounter.  EXAM: CT CHEST WITH CONTRAST  TECHNIQUE: Multidetector CT imaging of the chest was performed during intravenous contrast administration.  CONTRAST:  71mL OMNIPAQUE IOHEXOL 300 MG/ML  SOLN  COMPARISON:  Bone scan 11/10/2013. Abdominal pelvic CT of 10/27/2013.  FINDINGS: Lungs/Pleura: Minimal subpleural right lower  lobe thickening or scarring.  Patchy left apical interstitial thickening which is likely radiation induced.  No pleural fluid.  Heart/Mediastinum: No supraclavicular adenopathy. Left axillary node dissection and mastectomy. No axillary adenopathy.  Normal heart size, without pericardial effusion. No central pulmonary embolism, on this non-dedicated study. No mediastinal or hilar adenopathy. No internal mammary adenopathy.  Upper Abdomen: Marked hepatic steatosis with incompletely imaged hepatomegaly.  Normal imaged portions of the spleen, stomach, pancreas, adrenal glands, gallbladder, kidneys.  Bones/Musculoskeletal: Posterior eleventh left rib deformity is likely due to remote trauma. There also healed fractures of the sixth and fifth lateral left ribs, posttraumatic. Left-sided T6 hemangioma.  IMPRESSION: 1. Status post left mastectomy and axillary node dissection. No evidence of recurrent or metastatic disease within the chest. 2. Left fifth and sixth rib deformities,  posttraumatic. The posterior eleventh left rib abnormality is technically indeterminate. 3. Hepatic steatosis.   Electronically Signed   By: Abigail Miyamoto M.D.   On: 11/30/2013 13:28   Nm Bone Scan Whole Body  11/10/2013   CLINICAL DATA:  Sclerotic bone lesions seen on recent CT scan.  EXAM: NUCLEAR MEDICINE WHOLE BODY BONE SCAN  TECHNIQUE: Whole body anterior and posterior images were obtained approximately 3 hours after intravenous injection of radiopharmaceutical.  RADIOPHARMACEUTICALS:  25.0 mCi Technetium-99 MDP  COMPARISON:  CT scan 10/27/2013  FINDINGS: Abdomen uptake is noted in the left pubic bones, left iliac bone and possibly the right sacrum. This also uptake in the left eleventh rib which appears to correlate with an old fracture on the CT scan. There is also increased uptake and left sided anterior lateral ribs which could be posttraumatic change or metastatic disease. Degenerative type uptake in the spine.  IMPRESSION: Areas of increased uptake in the pelvis appear to correlate with the bone lesions seen on CT.  Left eleventh rib uptake correlates with an old healed fracture.  Increased uptake in the left anterior ribs likely due to remote trauma also but indeterminate.  Metastatic breast cancer is favored given patient's history.   Electronically Signed   By: Kalman Jewels M.D.   On: 11/10/2013 16:22    Labs:  CBC:  Recent Labs  10/27/13 0800 10/27/13 1336 10/28/13 0424 11/02/13 1506  WBC 6.6 6.1 5.6 7.0  HGB 9.7* 8.5* 8.3* 11.1*  HCT 29.4* 25.2* 25.4* 35.0*  PLT PLATELET CLUMPS NOTED ON SMEAR, COUNT APPEARS ADEQUATE 439* 452* 592*    COAGS: No results found for this basename: INR, APTT,  in the last 8760 hours  BMP:  Recent Labs  10/26/13 1854 10/27/13 0800 10/28/13 0424 11/02/13 1506  NA 136* 139 141 139  K 4.3 3.9 4.1 4.2  CL 100 102 108 99  CO2 19 19 18* 23  GLUCOSE 102* 83 93 89  BUN 11 9 5* 4*  CALCIUM 8.4 8.1* 7.5* 8.5  CREATININE 1.51* 1.34* 0.92 0.74    GFRNONAA 38* 44* 70* >90  GFRAA 44* 51* 81* >90    LIVER FUNCTION TESTS:  Recent Labs  07/30/13 1400 10/27/13 0800 10/28/13 0424 11/02/13 1506  BILITOT 0.4 0.7 0.4 0.4  AST 131* 58* 79* 94*  ALT 79* 34 31 43*  ALKPHOS 102 150* 121* 162*  PROT 7.0 6.3 5.6* 6.7  ALBUMIN 3.6 3.1* 2.6* 3.2*    TUMOR MARKERS:  Recent Labs  01/29/13 1408 07/30/13 1400 11/02/13 1506  CEA 4.3 8.0* 4.9    Assessment and Plan:  Hx breast ca L hip pain Bone scan/CT reveals L ilium lesion Now  scheduled for biopsy of same Pt aware of procedure benefits and risks and agreeable to proceed Consent signed andin chart  Thank you for this interesting consult.  I greatly enjoyed meeting Lashonne Shull and look forward to participating in their care.    I spent a total of 20 minutes face to face in clinical consultation, greater than 50% of which was counseling/coordinating care for L ilium lesion bx  Signed: Oakley Kossman A 12/08/2013, 11:35 AM

## 2013-12-08 NOTE — H&P (Signed)
Agree with above note. Lesion located in the left pubic bone.  CT guided biopsy appropriate.

## 2013-12-08 NOTE — Procedures (Signed)
CT guided biopsy of lucency lesion in left iliac bone.  4 FNAs and 2 cores performed.  No immediate complication.

## 2013-12-08 NOTE — Sedation Documentation (Addendum)
Patient is resting comfortably. 

## 2013-12-08 NOTE — Progress Notes (Signed)
Called lab to inquire about results pending for PT/INR and PTT.  Stated blood was sent to [facility].  Called lab and s/w Tersa at [facility], she stated the pt's blood had not arrived yet.  Informed short stay, IR, and CT.  Pt will stay in short stay for now.

## 2013-12-08 NOTE — Sedation Documentation (Signed)
Dr. Anselm Pancoast in to s/w pt and husband, questions answered.

## 2013-12-08 NOTE — Sedation Documentation (Signed)
Pt moaning w/ manipulation of needle.  MD requesting more lidocaine.

## 2013-12-08 NOTE — Discharge Instructions (Signed)
Needle Biopsy °Care After °These instructions give you information on caring for yourself after your procedure. Your doctor may also give you more specific instructions. Call your doctor if you have any problems or questions after your procedure. °HOME CARE °· Rest for 4 hours after your biopsy, except for getting up to go to the bathroom or as told. °· Keep the places where the needles were put in clean and dry. °¨ Do not put powder or lotion on the sites. °¨ Do not shower until 24 hours after the test. Remove all bandages (dressings) before showering. °¨ Remove all bandages at least once every day. Gently clean the sites with soap and water. Keep putting a new bandage on until the skin is closed. °Finding out the results of your test °Ask your doctor when your test results will be ready. Make sure you follow up and get the test results. °GET HELP RIGHT AWAY IF:  °· You have shortness of breath or trouble breathing. °· You have pain or cramping in your belly (abdomen). °· You feel sick to your stomach (nauseous) or throw up (vomit). °· Any of the places where the needles were put in: °¨ Are puffy (swollen) or red. °¨ Are sore or hot to the touch. °¨ Are draining yellowish-white fluid (pus). °¨ Are bleeding after 10 minutes of pressing down on the site. Have someone keep pressing on any place that is bleeding until you see a doctor. °· You have any unusual pain that will not stop. °· You have a fever. °If you go to the emergency room, tell the nurse that you had a biopsy. Take this paper with you to show the nurse. °MAKE SURE YOU:  °· Understand these instructions. °· Will watch your condition. °· Will get help right away if you are not doing well or get worse. °Document Released: 01/25/2008 Document Revised: 05/06/2011 Document Reviewed: 01/25/2008 °ExitCare® Patient Information ©2015 [employer]. This information is not intended to replace advice given to you by your health care provider. Make sure you discuss  any questions you have with your health care provider. ° °

## 2013-12-08 NOTE — Sedation Documentation (Signed)
Pt denies pain.

## 2013-12-10 ENCOUNTER — Telehealth (HOSPITAL_COMMUNITY): Payer: Self-pay | Admitting: *Deleted

## 2013-12-10 NOTE — Telephone Encounter (Signed)
Message copied by Gerhard Perches on Fri Dec 10, 2013 12:02 PM ------      Message from: Farrel Gobble A      Created: Thu Dec 09, 2013  3:04 PM       Re: pathology number [id number]  please request ER and PR determination and possibly HER-2 if enough tissue is available. Thanks ------

## 2013-12-10 NOTE — Telephone Encounter (Signed)
I called Christy @ Pathology and asked her to test specimen [phone] for ER/PR and possibly for HER2 status per Dr. Idamae Lusher orders. She said ok.

## 2013-12-16 ENCOUNTER — Other Ambulatory Visit (HOSPITAL_COMMUNITY): Payer: Self-pay | Admitting: Oncology

## 2013-12-17 ENCOUNTER — Other Ambulatory Visit (HOSPITAL_COMMUNITY): Payer: Self-pay | Admitting: Hematology and Oncology

## 2013-12-17 ENCOUNTER — Encounter (HOSPITAL_BASED_OUTPATIENT_CLINIC_OR_DEPARTMENT_OTHER): Payer: Self-pay

## 2013-12-17 ENCOUNTER — Encounter (HOSPITAL_COMMUNITY): Payer: Self-pay

## 2013-12-17 VITALS — BP 99/70 | HR 90 | Temp 98.4°F | Resp 18 | Wt 182.7 lb

## 2013-12-17 DIAGNOSIS — Z170 Estrogen receptor positive status [ER+]: Secondary | ICD-10-CM

## 2013-12-17 DIAGNOSIS — Z79811 Long term (current) use of aromatase inhibitors: Secondary | ICD-10-CM

## 2013-12-17 DIAGNOSIS — C50312 Malignant neoplasm of lower-inner quadrant of left female breast: Secondary | ICD-10-CM

## 2013-12-17 DIAGNOSIS — C7951 Secondary malignant neoplasm of bone: Secondary | ICD-10-CM

## 2013-12-17 MED ORDER — OXYCODONE HCL 10 MG PO TABS: ORAL_TABLET | ORAL | Status: DC

## 2013-12-17 MED ORDER — PALBOCICLIB 100 MG PO CAPS: ORAL_CAPSULE | ORAL | Status: DC

## 2013-12-17 MED ORDER — LETROZOLE 2.5 MG PO TABS: 2.5000 mg | ORAL_TABLET | Freq: Every day | ORAL | Status: DC

## 2013-12-17 MED ORDER — PROMETHAZINE HCL 25 MG PO TABS: 25.0000 mg | ORAL_TABLET | Freq: Four times a day (QID) | ORAL | Status: DC | PRN

## 2013-12-17 NOTE — Patient Instructions (Signed)
..  [facility] Discharge Instructions  RECOMMENDATIONS MADE BY THE CONSULTANT AND ANY TEST RESULTS WILL BE SENT TO YOUR REFERRING PHYSICIAN.  EXAM FINDINGS BY THE PHYSICIAN TODAY AND SIGNS OR SYMPTOMS TO REPORT TO CLINIC OR PRIMARY PHYSICIAN: Exam and findings as discussed by Dr. Barnet Glasgow.  MEDICATIONS PRESCRIBED:   Start Femara and when you receive  Ibrance call us.  Oxycodone for pain- prescription given  INSTRUCTIONS/FOLLOW-UP: Appt with clinical social worker next Tuesday or Wednesday 4 weeks to see Dr. Barnet Glasgow   Thank you for choosing [facility] to provide your oncology and hematology care.  To afford each patient quality time with our providers, please arrive at least 15 minutes before your scheduled appointment time.  With your help, our goal is to use those 15 minutes to complete the necessary work-up to ensure our physicians have the information they need to help with your evaluation and healthcare recommendations.    Effective January 1st, 2014, we ask that you re-schedule your appointment with our physicians should you arrive 10 or more minutes late for your appointment.  We strive to give you quality time with our providers, and arriving late affects you and other patients whose appointments are after yours.    Again, thank you for choosing [facility].  Our hope is that these requests will decrease the amount of time that you wait before being seen by our physicians.       _____________________________________________________________  Should you have questions after your visit to [facility], please contact our office at [phone] between the hours of 8:30 a.m. and 4:30 p.m.  Voicemails left after 4:30 p.m. will not be returned until the following business day.  For prescription refill requests, have your pharmacy contact our office with your prescription refill request.     _______________________________________________________________  We hope that we have given you very good care.  You may receive a patient satisfaction survey in the mail, please complete it and return it as soon as possible.  We value your feedback!  _______________________________________________________________  Have you asked about our STAR program?  STAR stands for Survivorship Training and Rehabilitation, and this is a nationally recognized cancer care program that focuses on survivorship and rehabilitation.  Cancer and cancer treatments may cause problems, such as, pain, making you feel tired and keeping you from doing the things that you need or want to do. Cancer rehabilitation can help. Our goal is to reduce these troubling effects and help you have the best quality of life possible.  You may receive a survey from a nurse that asks questions about your current state of health.  Based on the survey results, all eligible patients will be referred to the [facility] program for an evaluation so we can better serve you!  A frequently asked questions sheet is available upon request.

## 2013-12-17 NOTE — Patient Instructions (Signed)
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  Your dosage of IBRANCE will be a 100 mg capsule taken orally once a day for 21 consecutive days followed by 7 days off treatment. This will complete a 28 day cycle.   IBRANCE should be taken with food. If you vomit after taking IBRANCE or miss a dose, an additional dose should not be taken that day. The next prescribed dose should be taken at the usual time. IBRANCE capsules should be swallowed whole (do not chew, crush or open them prior to swallowing). No capsule should be ingested if it is broken, cracked, or otherwise not intact. Do not eat grapefruit or drink grapefruit juice for the duration of IBRANCE treatment. Grapefruit may interact with IBRANCE. Store [employer] at 43 F to 29 F (20 C to 25 C). Keep IBRANCE and all medicines out of the reach of children.  SIDE EFFECTS of IBRANCE:  Neutropenia - decreased neutrophil (white blood cell) count. Your neutrophils are the white blood cells that fight infection. If this number drops, it makes you more susceptible to infections. We will be monitoring your complete blood count (blood test) often per Dr. Idamae Lusher orders.  Anemia - Low red blood cell counts - If your red blood cells drop it can cause you to feel tired, weak, or short of breath.   Thrombocytopenia - low platelet counts - If your platelet count drops it can cause you to bruise/bleed more easily.  Always report any signs or symptoms of infection/bleeding: fever, chills, dizziness, shortness of breath, weakness, nosebleeds, or any increased tendency to bleed and/or to bruise!  Other common side effects of  IBRANCE include: fatigue (feeling tired), upper respiratory tract infections, nausea, numbness or tingling in your arms, hands, legs, feet, sore mouth, hair thinning/hair loss, diarrhea, decreased appetite, vomiting, weakness.   ADVERSE REACTION: Pulmonary Embolism - blood clot in lung ------------------------------------------------------------------------------------------------------------------------------------------------------------------------------------------  Femara (letrozole) lowers estrogen levels in postmenopausal women, which may slow the growth of certain types of breast tumors that need estrogen to grow in the body.  Administration: You will take Femara 1 tablet everyday. You may take this medicine with or without food.  Common Femara side effects may include: dizziness, drowsiness, weakness, tired feeling; hot flashes, warmth in your face or chest; bone pain, muscle or joint pain; flushing (warmth, redness, or tingly feeling); headache; increased sweating; or swelling, weight gain.  Get emergency medical help if you have signs of an allergic reaction to Femara: hives; difficult breathing; swelling of your face, lips, tongue, or throat.  ------------------------------------------------------------------------------------------------------------------------------------------------------------------------------------------  XGEVA - this is being given to protect your bones from further bone loss/fractures. This will be given once every 28 days. This medication comes in the form of a shot. We administer the medication into the fatty tissue of your abdomen. You will be responsible for taking Calcium/Vitamin D @ [facility] daily. You must make sure that you take your Calcium/Vitamin D everyday!   EDUCATIONAL MATERIALS GIVEN AND REVIEWED: Chemotherapy and You Booklet Specific Instructions Sheets: IBRANCE, Femara, XGEVA, Calcium & Vit D   SELF CARE ACTIVITIES WHILE ON  CHEMOTHERAPY: Increase your fluid intake 48 hours prior to treatment and drink at least 2 quarts per day after treatment., No alcohol intake., No aspirin or  other medications unless approved by your oncologist., Eat foods that are light and easy to digest., Eat foods at cold or room temperature., No fried, fatty, or spicy foods immediately before or after treatment., Have teeth cleaned professionally before starting treatment. Keep dentures and partial plates clean., Use soft toothbrush and do not use mouthwashes that contain alcohol. Biotene is a good mouthwash that is available at most pharmacies or may be ordered by calling [phone]., Use warm salt water gargles (1 teaspoon salt per 1 quart warm water) before and after meals and at bedtime. Or you may rinse with 2 tablespoons of three -percent hydrogen peroxide mixed in eight ounces of water., Always use sunscreen with SPF (Sun Protection Factor) of 30 or higher., Use your nausea medication as directed to prevent nausea., Use your stool softener or laxative as directed to prevent constipation. and Use your anti-diarrheal medication as directed to stop diarrhea.  Please wash your hands for at least 30 seconds using warm soapy water. Handwashing is the #1 way to prevent the spread of germs. Stay away from sick people or people who are getting over a cold. If you develop respiratory systems such as green/yellow mucus production or productive cough or persistent cough let us know and we will see if you need an antibiotic. It is a good idea to keep a pair of gloves on when going into grocery stores/[employer] to decrease your risk of coming into contact with germs on the carts, etc. Carry alcohol hand gel with you at all times and use it frequently if out in public. All foods need to be cooked thoroughly. No raw foods. No medium or undercooked meats, eggs. If your food is cooked medium well, it does not need to be hot pink or saturated with bloody liquid at all.  Vegetables and fruits need to be washed/rinsed under the faucet with a dish detergent before being consumed. You can eat raw fruits and vegetables unless we tell you otherwise but it would be best if you cooked them or bought frozen. Do not eat off of salad bars or hot bars unless you really trust the cleanliness of the restaurant. If you need dental work, please let us know before you go for your appointment so that we can coordinate the best possible time for you in regards to your chemo regimen. You need to also let your dentist know that you are actively taking chemo. We may need to do labs prior to your dental appointment. We also want your bowels moving at least every other day. If this is not happening, we need to know so that we can get you on a bowel regimen to help you go.      MEDICATIONS: You have been given prescriptions for the following medications:  IBRANCE 156m capsule. Take 1 capsule daily with food x 21 days followed by 7 days of not taking any IBRANCE. Then resume after the 7 day rest period.   Femara. Take 1 tablet everyday.    Over-the-Counter:  Colace - this is a stool softener. Take 1036mcapsule 2-6 times a day as needed. If you have to take more than 6 capsules of Colace a day call the [facility] - this is a mild laxative used to treat mild constipation. May take 2 tabs by mouth daily or up to twice a day as needed for mild constipation.  Milk of Magnesia - this is a laxative used to treat moderate to severe constipation. May take  2-4 tablespoons every 8 hours as needed. May increase to 8 tablespoons x 1 dose and if no bowel movement call the [facility].  Imodium - this is for diarrhea. Take 2 tabs after 1st loose stool and then 1 tab every 2 hours until you go a total of 12 hours without a loose stool. Call [facility] if loose stools continue.   SYMPTOMS TO REPORT AS SOON AS POSSIBLE AFTER TREATMENT:  FEVER GREATER THAN 100.5 F  CHILLS WITH OR  WITHOUT FEVER  NAUSEA AND VOMITING THAT IS NOT CONTROLLED WITH YOUR NAUSEA MEDICATION  UNUSUAL SHORTNESS OF BREATH  UNUSUAL BRUISING OR BLEEDING  TENDERNESS IN MOUTH AND THROAT WITH OR WITHOUT PRESENCE OF ULCERS  URINARY PROBLEMS  BOWEL PROBLEMS  UNUSUAL RASH    Wear comfortable clothing and clothing appropriate for easy access to any Portacath or PICC line. Let us know if there is anything that we can do to make your therapy better!      I have been informed and understand all of the instructions given to me and have received a copy. I have been instructed to call the clinic [phone] or my family physician as soon as possible for continued medical care, if indicated. I do not have any more questions at this time but understand that I may call the [facility] or the Patient Navigator at [phone] during office hours should I have questions or need assistance in obtaining follow-up care.     Palbociclib capsules What is this medicine? PALBOCICLIB (pal boe SYE klib) is a chemotherapy drug. It targets a specific protein within cancer cells and stops the cancer cells from growing. This medicine is used to treat breast cancer. This medicine may be used for other purposes; ask your health care provider or pharmacist if you have questions. COMMON BRAND NAME(S): Ibrance What should I tell my health care provider before I take this medicine? They need to know if you have any of these conditions: -infection (especially a virus infection such as chickenpox, cold sores, or herpes) -low blood counts, like low white cell, platelet, or red cell counts -an unusual or allergic reaction to palbociclib, other medicines, foods, dyes, or preservatives -pregnant or trying to get pregnant -breast-feeding How should I use this medicine? Take this medicine by mouth with a glass of water. Follow the directions on the prescription label. Take this medicine with food. Avoid grapefruit and  grapefruit juice while you are taking this medicine. Swallow the capsule whole. Do not cut, crush or chew this medicine. Take your medicine at regular intervals. Do not take it more often than directed. Do not stop taking except on your doctor's advice. Talk to your pediatrician regarding the use of this medicine in children. Special care may be needed. Overdosage: If you think you've taken too much of this medicine contact a poison control center or emergency room at once. Overdosage: If you think you have taken too much of this medicine contact a poison control center or emergency room at once. NOTE: This medicine is only for you. Do not share this medicine with others. What if I miss a dose? If you miss a dose or vomit after taking a dose, do not take another dose on that day. Take your next dose at your regular time. What may interact with this medicine? This medicine may interact with the following medications: -alfentanil -antiviral medicines for HIV or AIDS -boceprevir -bosentan -carbamazepine -certain medicines for fungal infections like ketoconazole, itraconazole, posaconazole,  voriconazole -clarithromycin -cyclosporine -ergot alkaloids like dihydroergotamine, ergotamine -everolimus -fentanyl -grapefruit juice -midazolam -modafinil -nafcillin -nefazodone -phenobarbital -phenytoin -pimozide -quinidine -rifampin -sirolimus -St. John's Wort -tacrolimus -telaprevir -telithromycin -verapamil This list may not describe all possible interactions. Give your health care provider a list of all the medicines, herbs, non-prescription drugs, or dietary supplements you use. Also tell them if you smoke, drink alcohol, or use illegal drugs. Some items may interact with your medicine. What should I watch for while using this medicine? Visit your doctor for regular check ups. Report any side effects. Continue your course of treatment unless your doctor tells you to stop. You will need  blood work done while you are taking this medicine. Do not become pregnant while taking this medicine or for 2 weeks after stopping it. Women should inform their doctor if they wish to become pregnant or think they might be pregnant. There is a potential for serious side effects to an unborn child. Talk to your health care professional or pharmacist for more information. Do not breast-feed an infant while taking this medicine. Avoid taking products that contain aspirin, acetaminophen, ibuprofen, naproxen, or ketoprofen unless instructed by your doctor. These medicines may hide a fever. Be careful brushing and flossing your teeth or using a toothpick because you may get an infection or bleed more easily. If you have any dental work done, tell your dentist you are receiving this medicine. Call your doctor or health care professional for advice if you get a fever, chills or sore throat, or other symptoms of a cold or flu. Do not treat yourself. This drug decreases your body's ability to fight infections. Try to avoid being around people who are sick. This medicine may increase your risk to bruise or bleed. Call your doctor or health care professional if you notice any unusual bleeding. This drug may make you feel generally unwell. This is not uncommon, as chemotherapy can affect healthy cells as well as cancer cells. Report any side effects. Continue your course of treatment even though you feel ill unless your doctor tells you to stop. This medicine may cause low sperm counts in some men. However, this medicine is not usually used in men. Talk to your health care professional or pharmacist for more information. What side effects may I notice from receiving this medicine? Side effects that you should report to your doctor or health care professional as soon as possible: -allergic reactions like skin rash, itching or hives, swelling of the face, lips, or tongue -dizziness -mouth sores -low blood counts -  this medicine may decrease the number of white blood cells, red blood cells and platelets. You may be at increased risk for infections and bleeding. -pain, tingling, numbness in the hands or feet -severe or persistent diarrhea, nausea, vomiting -signs and symptoms of a blood clot such as breathing problems; changes in vision; chest pain; severe, sudden headache; pain, swelling, warmth in the leg; trouble speaking; sudden numbness or weakness of the face, arm or leg -signs and symptoms of infection like fever or chills; cough; sore throat; pain or trouble passing urine -signs of decreased platelets or bleeding - nosebleed, bruising, pinpoint red spots on the skin, black, tarry stools, blood in the urine -signs of decreased red blood cells - unusually weak or tired, feeling faint or lightheaded, falls Side effects that usually do not require medical attention (Report these to your doctor or health care professional if they continue or are bothersome.): -decreased appetite -hair thinning or hair loss -mild  diarrhea -nausea -weak or tired This list may not describe all possible side effects. Call your doctor for medical advice about side effects. You may report side effects to FDA at 1-800-FDA-1088. Where should I keep my medicine? Keep out of the reach of children. Store between 20 and 25 degrees C (68 and 77 degrees F). Throw away any unused medicine after the expiration date. NOTE: This sheet is a summary. It may not cover all possible information. If you have questions about this medicine, talk to your doctor, pharmacist, or health care provider.  2015, [employer]vier/Gold Standard. (2013-04-08 12:38:37) Letrozole tablets What is this medicine? LETROZOLE (LET roe zole) blocks the production of estrogen. Certain types of breast cancer grow under the influence of estrogen. Letrozole helps block tumor growth. This medicine is used to treat advanced breast cancer in postmenopausal women. This medicine  may be used for other purposes; ask your health care provider or pharmacist if you have questions. COMMON BRAND NAME(S): Femara What should I tell my health care provider before I take this medicine? They need to know if you have any of these conditions: -liver disease -osteoporosis (weak bones) -an unusual or allergic reaction to letrozole, other medicines, foods, dyes, or preservatives -pregnant or trying to get pregnant -breast-feeding How should I use this medicine? Take this medicine by mouth with a glass of water. You may take it with or without food. Follow the directions on the prescription label. Take your medicine at regular intervals. Do not take your medicine more often than directed. Do not stop taking except on your doctor's advice. Talk to your pediatrician regarding the use of this medicine in children. Special care may be needed. Overdosage: If you think you have taken too much of this medicine contact a poison control center or emergency room at once. NOTE: This medicine is only for you. Do not share this medicine with others. What if I miss a dose? If you miss a dose, take it as soon as you can. If it is almost time for your next dose, take only that dose. Do not take double or extra doses. What may interact with this medicine? Do not take this medicine with any of the following medications: -estrogens, like hormone replacement therapy or birth control pills This medicine may also interact with the following medications: -dietary supplements such as androstenedione or DHEA -prasterone -tamoxifen This list may not describe all possible interactions. Give your health care provider a list of all the medicines, herbs, non-prescription drugs, or dietary supplements you use. Also tell them if you smoke, drink alcohol, or use illegal drugs. Some items may interact with your medicine. What should I watch for while using this medicine? Visit your doctor or health care professional  for regular check-ups to monitor your condition. Do not use this drug if you are pregnant. Serious side effects to an unborn child are possible. Talk to your doctor or pharmacist for more information. You may get drowsy or dizzy. Do not drive, use machinery, or do anything that needs mental alertness until you know how this medicine affects you. Do not stand or sit up quickly, especially if you are an older patient. This reduces the risk of dizzy or fainting spells. What side effects may I notice from receiving this medicine? Side effects that you should report to your doctor or health care professional as soon as possible: -allergic reactions like skin rash, itching, or hives -bone fracture -chest pain -difficulty breathing or shortness of breath -severe pain,  swelling, warmth in the leg -unusually weak or tired -vaginal bleeding Side effects that usually do not require medical attention (report to your doctor or health care professional if they continue or are bothersome): -bone, back, joint, or muscle pain -dizziness -fatigue -fluid retention -headache -hot flashes, night sweats -nausea -weight gain This list may not describe all possible side effects. Call your doctor for medical advice about side effects. You may report side effects to FDA at 1-800-FDA-1088. Where should I keep my medicine? Keep out of the reach of children. Store between 15 and 30 degrees C (59 and 86 degrees F). Throw away any unused medicine after the expiration date. NOTE: This sheet is a summary. It may not cover all possible information. If you have questions about this medicine, talk to your doctor, pharmacist, or health care provider.  2015, [employer]vier/Gold Standard. (2007-04-24 16:43:44) Denosumab injection What is this medicine? DENOSUMAB (den oh sue mab) slows bone breakdown. Prolia is used to treat osteoporosis in women after menopause and in men. Delton See is used to prevent bone fractures and other bone  problems caused by cancer bone metastases. Delton See is also used to treat giant cell tumor of the bone. This medicine may be used for other purposes; ask your health care provider or pharmacist if you have questions. COMMON BRAND NAME(S): Prolia, XGEVA What should I tell my health care provider before I take this medicine? They need to know if you have any of these conditions: -dental disease -eczema -infection or history of infections -kidney disease or on dialysis -low blood calcium or vitamin D -malabsorption syndrome -scheduled to have surgery or tooth extraction -taking medicine that contains denosumab -thyroid or parathyroid disease -an unusual reaction to denosumab, other medicines, foods, dyes, or preservatives -pregnant or trying to get pregnant -breast-feeding How should I use this medicine? This medicine is for injection under the skin. It is given by a health care professional in a hospital or clinic setting. If you are getting Prolia, a special MedGuide will be given to you by the pharmacist with each prescription and refill. Be sure to read this information carefully each time. For Prolia, talk to your pediatrician regarding the use of this medicine in children. Special care may be needed. For Delton See, talk to your pediatrician regarding the use of this medicine in children. While this drug may be prescribed for children as young as 13 years for selected conditions, precautions do apply. Overdosage: If you think you've taken too much of this medicine contact a poison control center or emergency room at once. Overdosage: If you think you have taken too much of this medicine contact a poison control center or emergency room at once. NOTE: This medicine is only for you. Do not share this medicine with others. What if I miss a dose? It is important not to miss your dose. Call your doctor or health care professional if you are unable to keep an appointment. What may interact with this  medicine? Do not take this medicine with any of the following medications: -other medicines containing denosumab This medicine may also interact with the following medications: -medicines that suppress the immune system -medicines that treat cancer -steroid medicines like prednisone or cortisone This list may not describe all possible interactions. Give your health care provider a list of all the medicines, herbs, non-prescription drugs, or dietary supplements you use. Also tell them if you smoke, drink alcohol, or use illegal drugs. Some items may interact with your medicine. What should I watch  for while using this medicine? Visit your doctor or health care professional for regular checks on your progress. Your doctor or health care professional may order blood tests and other tests to see how you are doing. Call your doctor or health care professional if you get a cold or other infection while receiving this medicine. Do not treat yourself. This medicine may decrease your body's ability to fight infection. You should make sure you get enough calcium and vitamin D while you are taking this medicine, unless your doctor tells you not to. Discuss the foods you eat and the vitamins you take with your health care professional. See your dentist regularly. Brush and floss your teeth as directed. Before you have any dental work done, tell your dentist you are receiving this medicine. Do not become pregnant while taking this medicine or for 5 months after stopping it. Women should inform their doctor if they wish to become pregnant or think they might be pregnant. There is a potential for serious side effects to an unborn child. Talk to your health care professional or pharmacist for more information. What side effects may I notice from receiving this medicine? Side effects that you should report to your doctor or health care professional as soon as possible: -allergic reactions like skin rash, itching or  hives, swelling of the face, lips, or tongue -breathing problems -chest pain -fast, irregular heartbeat -feeling faint or lightheaded, falls -fever, chills, or any other sign of infection -muscle spasms, tightening, or twitches -numbness or tingling -skin blisters or bumps, or is dry, peels, or red -slow healing or unexplained pain in the mouth or jaw -unusual bleeding or bruising Side effects that usually do not require medical attention (Report these to your doctor or health care professional if they continue or are bothersome.): -muscle pain -stomach upset, gas This list may not describe all possible side effects. Call your doctor for medical advice about side effects. You may report side effects to FDA at 1-800-FDA-1088. Where should I keep my medicine? This medicine is only given in a clinic, doctor's office, or other health care setting and will not be stored at [facility]. NOTE: This sheet is a summary. It may not cover all possible information. If you have questions about this medicine, talk to your doctor, pharmacist, or health care provider.  2015, Elsevier/Gold Standard. (2011-08-12 12:37:47)  Calcium; Vitamin D oral tablets What is this medicine? CALCIUM; VITAMIN D (KAL see um; VYE ta min D) is a vitamin supplement. It is used to prevent conditions of low calcium and vitamin D. This medicine may be used for other purposes; ask your health care provider or pharmacist if you have questions. COMMON BRAND NAME(S): Calcarb 600 with Vitamin D, Calcet Plus Vitamin D, Calcitrate + D, Caltrate, Caltrate 600+D, Citracal + D, Citracal MAXIMUM + D, Citracal Petites with Vitamin D, Citrus Calcium Plus D, OSCAL 500 + D, OSCAL Calcium + D3, OSCAL Extra D3, Osteo-Poretical, Oysco 500 + D, Oysco D What should I tell my health care provider before I take this medicine? They need to know if you have any of these conditions: -constipation -dehydration -heart disease -high level of calcium or vitamin  D in the blood -high level of phosphate in the blood -kidney disease -kidney stones -liver disease -parathyroid disease -sarcoidosis -stomach ulcer or obstruction -an unusual or allergic reaction to calcium, vitamin D, tartrazine dye, other medicines, foods, dyes, or preservatives -pregnant or trying to get pregnant -breast-feeding How should I use this medicine?  Take this medicine by mouth with a glass of water. Follow the directions on the label. Take with food or within 1 hour after a meal. Take your medicine at regular intervals. Do not take your medicine more often than directed. Talk to your pediatrician regarding the use of this medicine in children. While this medicine may be used in children for selected conditions, precautions do apply. Overdosage: If you think you have taken too much of this medicine contact a poison control center or emergency room at once. NOTE: This medicine is only for you. Do not share this medicine with others. What if I miss a dose? If you miss a dose, take it as soon as you can. If it is almost time for your next dose, take only that dose. Do not take double or extra doses. What may interact with this medicine? Do not take this medicine with any of the following medications: -ammonium chloride -methenamine This medicine may also interact with the following medications: -antibiotics like ciprofloxacin, gatifloxacin, tetracycline -captopril -delavirdine -diuretics -gabapentin -iron supplements -medicines for fungal infections like ketoconazole and itraconazole -medicines for seizures like ethotoin and phenytoin -mineral oil -mycophenolate -other vitamins with calcium, vitamin D, or minerals -quinidine -rosuvastatin -sucralfate -thyroid medicine This list may not describe all possible interactions. Give your health care provider a list of all the medicines, herbs, non-prescription drugs, or dietary supplements you use. Also tell them if you  smoke, drink alcohol, or use illegal drugs. Some items may interact with your medicine. What should I watch for while using this medicine? Taking this medicine is not a substitute for a well-balanced diet and exercise. Talk with your doctor or health care provider and follow a healthy lifestyle. Do not take this medicine with high-fiber foods, large amounts of alcohol, or drinks containing caffeine. Do not take this medicine within 2 hours of any other medicines. What side effects may I notice from receiving this medicine? Side effects that you should report to your doctor or health care professional as soon as possible: -allergic reactions like skin rash, itching or hives, swelling of the face, lips, or tongue -confusion -dry mouth -high blood pressure -increased hunger or thirst -increased urination -irregular heartbeat -metallic taste -muscle or bone pain -pain when urinating -seizure -unusually weak or tired -weight loss Side effects that usually do not require medical attention (report to your doctor or health care professional if they continue or are bothersome): -constipation -diarrhea -headache -loss of appetite -nausea, vomiting -stomach upset This list may not describe all possible side effects. Call your doctor for medical advice about side effects. You may report side effects to FDA at 1-800-FDA-1088. Where should I keep my medicine? Keep out of the reach of children. Store at room temperature between 15 and 30 degrees C (59 and 86 degrees F). Protect from light. Keep container tightly closed. Throw away any unused medicine after the expiration date. NOTE: This sheet is a summary. It may not cover all possible information. If you have questions about this medicine, talk to your doctor, pharmacist, or health care provider.  2015, Elsevier/Gold Standard. (2007-05-27 17:56:23)

## 2013-12-17 NOTE — Progress Notes (Signed)
Teaching done regarding Ibrance and Femara. Consent signed for Brittany Blair @ [facility]. Patient to call once Leslee Home has come in via mail. I will give pt her ibrance/femara calendar when she comes back in.

## 2013-12-17 NOTE — Progress Notes (Signed)
[facility]  OFFICE PROGRESS NOTE  No PCP Per Patient No address on file  DIAGNOSIS: Cancer of lower-inner quadrant of female breast, left - Plan: CBC with Differential, Comprehensive metabolic panel, CEA, Cancer antigen 27.29, CBC with Differential, Comprehensive metabolic panel, CEA, Cancer antigen 27.29  Bone metastases - Plan: CBC with Differential, Comprehensive metabolic panel, CEA, Cancer antigen 27.29, CBC with Differential, Comprehensive metabolic panel, CEA, Cancer antigen 27.29  Chief Complaint  Patient presents with   Breast Cancer   Bone metastases    CURRENT THERAPY: 10/14/2013 TAH/BSO  INTERVAL HISTORY: Brittany Blair 53 y.o. female returns for followup of abnormal imaging studies following discovery of an elevated CA27.29 after a CT of abd/pelvis was performed demonstrating worrisome findings for osseous metastatic disease. 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 The patient is here today to discuss treatment. She had been on tamoxifen which was stopped after she developed vaginal bleeding which eventually led to TAH/BSO. She is still having pain on a scale of 7/10 involving the left side of her back and groin. Bowel movements are normalized with MiraLAX. She denies any fever, night sweats, cough, wheezing, sore throat, nausea, vomiting, melena, hematochezia, hematuria, vaginal bleeding, or significant lower extremity swelling.    MEDICAL HISTORY: Past Medical History  Diagnosis Date   Anxiety    Herpes    MRSA (methicillin resistant Staphylococcus aureus)    Breast lump     left   Hypertension    Hematoma    Swelling     left foot   Multiple blisters     along surgical site   Depression    Genital herpes 08/04/2012   PMB (postmenopausal bleeding) 06/23/2013   Lymph edema    Vaginal delivery 1981, 1984, 1987,  1992   Breast cancer dx'd 05/27/2010    chemo/xrt comp 01/2011    INTERIM HISTORY: has Breast Cancer, IDC,Left, central, Stage III, receptor + Her2 -; Syncope; GAD (generalized anxiety disorder); HTN (hypertension), benign; Chronic chest pain; Depression; Transaminitis; Genital herpes; PMB (postmenopausal bleeding); Post-menopausal bleeding; Acute blood loss anemia; Abdominal pain; AKI (acute kidney injury); GIB (gastrointestinal bleeding); Syncope and collapse; and Prepyloric ulcer on her problem list.   Infiltrating ductal carcinoma the left breast, grade 3, stage IIIA (probably downstaged from IIIB following neoadjuvant chemotherapy described below). She was given Taxotere x3 cycles preoperatively in a dose dense fashion followed by 2 cycles of dose dense FEC. Her chemotherapy was interrupted because of the severe MRSA infection prompting discontinuation. At the time of definitive surgery on 10/10/2010 she still had a 4.5 cm cancer with LV I and 7 of 8 positive lymph nodes. ER receptors 61% positive, PR receptors 100% positive, HER-2/neu nonamplified, Ki-67 are high at 75%. She was started on Arimidex but was intolerant to the medication with severe side effects. Then on 05/22/2012, she was transitioned to Tamoxifen which was stopped 2 months ago because of vaginal bleeding. She subsequently underwent TAH/BSO on 10/14/2013. CT scan of the abdomen showed findings consistent with possible osseous metastases so she underwent iliac bone biopsy on 12/08/2013 with findings of adenocarcinoma, ER/PR positive, HER-2/neu not overexpressed.    ALLERGIES:  is allergic to bee venom.  MEDICATIONS: has a current medication list which includes the following prescription(s): acyclovir, alprazolam, amlodipine, clotrimazole, hydrocodone-acetaminophen, lisinopril, loratadine, metoprolol succinate, polyethylene glycol, tetrahydrozoline-zinc, trazodone, venlafaxine xr, letrozole, oxycodone hcl, palbociclib, and  promethazine.  SURGICAL  HISTORY:  Past Surgical History  Procedure Laterality Date   Tubal ligation     Wisdom tooth extraction     Mastectomy modified radical  10/10/10     left -Dr Margot Chimes   Laparoscopic assisted vaginal hysterectomy Bilateral 10/14/2013    Procedure: LAPAROSCOPIC ASSISTED VAGINAL HYSTERECTOMY;  Surgeon: Ena Dawley, MD;  Location: [facility];  Service: Gynecology;  Laterality: Bilateral;   Cystoscopy N/A 10/14/2013    Procedure: CYSTOSCOPY;  Surgeon: Ena Dawley, MD;  Location: [facility];  Service: Gynecology;  Laterality: N/A;   Laparoscopic lysis of adhesions N/A 10/14/2013    Procedure: LAPAROSCOPIC LYSIS OF ADHESIONS;  Surgeon: Ena Dawley, MD;  Location: [facility];  Service: Gynecology;  Laterality: N/A;   Laparoscopic bilateral salpingo oopherectomy Bilateral 10/14/2013    Procedure: LAPAROSCOPIC BILATERAL SALPINGO OOPHORECTOMY;  Surgeon: Ena Dawley, MD;  Location: [facility];  Service: Gynecology;  Laterality: Bilateral;   Esophagogastroduodenoscopy N/A 10/27/2013    Procedure: ESOPHAGOGASTRODUODENOSCOPY (EGD);  Surgeon: Wonda Horner, MD;  Location: [facility];  Service: Endoscopy;  Laterality: N/A;   Bone biopsy Left 11/2013    left post. hip    FAMILY HISTORY: family history includes Cancer in her maternal grandmother and paternal grandmother; Heart disease in her father; Hypertension in her daughter, maternal grandfather, paternal grandfather, and son.  SOCIAL HISTORY:  reports that she has been smoking Cigarettes.  She has been smoking about 0.35 packs per day. She has never used smokeless tobacco. She reports that she drinks about 12.6 ounces of alcohol per week. She reports that she does not use illicit drugs.  REVIEW OF SYSTEMS:  Other than that discussed above is noncontributory.  PHYSICAL EXAMINATION: ECOG PERFORMANCE STATUS: 1 - Symptomatic but completely ambulatory  Blood pressure 99/70, pulse 90, temperature 98.4 F (36.9 C), temperature  source Oral, resp. rate 18, weight 182 lb 11.2 oz (82.872 kg), last menstrual period 05/29/2013, SpO2 98.00%.  GENERAL:alert, no distress and comfortable. Moderately obese. SKIN: skin color, texture, turgor are normal, no rashes or significant lesions EYES: PERLA; Conjunctiva are pink and non-injected, sclera clear SINUSES: No redness or tenderness over maxillary or ethmoid sinuses OROPHARYNX:no exudate, no erythema on lips, buccal mucosa, or tongue. NECK: supple, thyroid normal size, non-tender, without nodularity. No masses CHEST: Status post left mastectomy with induration of the chest wall. No right breast mass. LYMPH:  no palpable lymphadenopathy in the cervical, axillary or inguinal LUNGS: clear to auscultation and percussion with normal breathing effort HEART: regular rate & rhythm and no murmurs. ABDOMEN:abdomen soft, non-tender and normal bowel sounds MUSCULOSKELETAL:no cyanosis of digits and no clubbing. Range of motion normal. Left upper extremity lymphedema. NEURO: alert & oriented x 3 with fluent speech, no focal motor/sensory deficits   LABORATORY DATA: Hospital Outpatient Visit on 12/08/2013  Component Date Value Ref Range Status   aPTT 12/08/2013 30  24 - 37 seconds Final   Performed at [facility]   WBC 12/08/2013 5.2  4.0 - 10.5 K/uL Final   WHITE COUNT CONFIRMED ON SMEAR   RBC 12/08/2013 3.59* 3.87 - 5.11 MIL/uL Final   Hemoglobin 12/08/2013 12.0  12.0 - 15.0 g/dL Final   HCT 12/08/2013 36.0  36.0 - 46.0 % Final   MCV 12/08/2013 100.3* 78.0 - 100.0 fL Final   MCH 12/08/2013 33.4  26.0 - 34.0 pg Final   MCHC 12/08/2013 33.3  30.0 - 36.0 g/dL Final   RDW 12/08/2013 15.8* 11.5 - 15.5 % Final   Platelets 12/08/2013 285  150 -  400 K/uL Final   Prothrombin Time 12/08/2013 12.5  11.6 - 15.2 seconds Final   INR 12/08/2013 0.92  0.00 - 1.49 Final   Performed at [facility]:  for KINSLEA, FRANCES  ([id number]) Patient: Freda Munro Collected: 12/08/2013 Client: [place] Accession: [id number] Received: 12/08/2013 Markus Daft DOB: Sep 11, 1960 Age: 69 Gender: F Reported: 12/09/2013 [address] Patient Ph: [phone] MRN #: [id number] [place], [place] [address] Visit #: [id number] Chart #: Phone:  Fax: CC: [name], PA-C REPORT OF SURGICAL PATHOLOGY ADDITIONAL INFORMATION: PROGNOSTIC INDICATORS - ACIS Results: IMMUNOHISTOCHEMICAL AND MORPHOMETRIC ANALYSIS BY THE AUTOMATED CELLULAR IMAGING SYSTEM (ACIS) Estrogen Receptor: 95%, POSITIVE, STORNG STAINING INTENSITY Progesterone Receptor: 3%, POSITIVE, STRONG STAINING INTENSITY REFERENCE RANGE ESTROGEN RECEPTOR NEGATIVE <1% POSITIVE =>1% PROGESTERONE RECEPTOR NEGATIVE <1% POSITIVE =>1% All controls stained appropriately Enid Cutter MD Pathologist, Electronic Signature ( Signed 12/15/2013) CHROMOGENIC IN-SITU HYBRIDIZATION Results: HER-2/NEU BY CISH - NO AMPLIFICATION OF HER-2 DETECTED. RESULT RATIO OF HER2: CEP 17 SIGNALS 0.95 AVERAGE HER2 COPY NUMBER PER CELL 2.00 REFERENCE RANGE 1 of 2 FINAL for Milstein, Dalynn ([id number]) ADDITIONAL INFORMATION:(continued) NEGATIVE HER2/Chr17 Ratio <2.0 and Average HER2 copy number <4.0 EQUIVOCAL HER2/Chr17 Ratio <2.0 and Average HER2 copy number 4.0 and <6.0 POSITIVE HER2/Chr17 Ratio >=2.0 and/or Average HER2 copy number >=6.0 Enid Cutter MD Pathologist, Electronic Signature ( Signed 12/14/2013) FINAL DIAGNOSIS Diagnosis Bone, biopsy, left iliac - METASTATIC CARCINOMA. - SEE COMMENT. Microscopic Comment A prognostic profile will be performed and the result reported separately. (JBK:kh 12-09-13) Enid Cutter MD Pathologist, Electronic Signature (Case signed 12/09/2013) Specimen Gross and Clinical Information Specimen(s) Obtained: Bone, biopsy, left iliac Specimen Clinical Information breast cancer with lucent and sclerotic bone lesions,  concern for metastatic disease (cm) Gross The specimen is received in formalin and consists of a 1.5 x 1.0 x 0.2 cm aggregate of red brown clotted blood. The specimen is entirely submitted in one cassette. (KL:kh 12-08-13) Disclaimer Her2Neu by CISH (chromogenic in-situ hybridization) is performed at [facility], using the [place] pharmDx Kit (code number [id number]). This test is used to detect the amplification of the Her2Neu gene in interphase nuclei from formalin fixed, paraffin embedded tissue and is reported using ASCO/CAP scoring criteria published in 2013. PR progesterone receptor (PgR 636), immunohistochemical stains are performed on formalin fixed, paraffin embedded tissue using a 3,3"-diaminobenzidine (DAB) chromogen and DAKO Autostainer System. The staining intensity of the nucleus is scored morphometrically using the Automated Cellular Imaging System (ACIS) and is reported as the percentage of tumor cell nuclei demonstrating specific nuclear staining. Estrogen receptor (SP1), immunohistochemical stains are performed on formalin fixed, paraffin embedded tissue using a 3,3"-diaminobenzidine (DAB) chromogen and DAKO Autostainer System. The staining intensity of the nucleus is scored morphometrically using the Automated Cellular Imaging System (ACIS) and is reported as the percentage of tumor cell nuclei demonstrating specific nuclear staining. Report signed out from the following location(s) Technical Component and Interpretation performed at [facility]. [address],[place], [address].YSHU:[id number],    Urinalysis    Component Value Date/Time   COLORURINE YELLOW 10/26/2013 1942   APPEARANCEUR CLOUDY* 10/26/2013 1942   LABSPEC 1.008 10/26/2013 1942   PHURINE 5.5 10/26/2013 1942   GLUCOSEU NEGATIVE 10/26/2013 1942   HGBUR SMALL* 10/26/2013 1942   BILIRUBINUR NEGATIVE 10/26/2013 1942   KETONESUR NEGATIVE 10/26/2013 1942   PROTEINUR 30* 10/26/2013 1942    UROBILINOGEN 0.2 10/26/2013 1942   NITRITE NEGATIVE 10/26/2013 1942   LEUKOCYTESUR MODERATE* 10/26/2013 1942    RADIOGRAPHIC  STUDIES: Ct Chest W Contrast  11/30/2013   CLINICAL DATA:  Abnormal bone scan; left breast ca 05-2010 semi-radial mastectomy/chemo/rad tx; new diagnosis of bone mets to pelvis; restaging; pt states she has lymphedema to left arm. Metastatic disease in bone, rule out lung metastases restaging Subsequent encounter.  EXAM: CT CHEST WITH CONTRAST  TECHNIQUE: Multidetector CT imaging of the chest was performed during intravenous contrast administration.  CONTRAST:  31m OMNIPAQUE IOHEXOL 300 MG/ML  SOLN  COMPARISON:  Bone scan 11/10/2013. Abdominal pelvic CT of 10/27/2013.  FINDINGS: Lungs/Pleura: Minimal subpleural right lower lobe thickening or scarring.  Patchy left apical interstitial thickening which is likely radiation induced.  No pleural fluid.  Heart/Mediastinum: No supraclavicular adenopathy. Left axillary node dissection and mastectomy. No axillary adenopathy.  Normal heart size, without pericardial effusion. No central pulmonary embolism, on this non-dedicated study. No mediastinal or hilar adenopathy. No internal mammary adenopathy.  Upper Abdomen: Marked hepatic steatosis with incompletely imaged hepatomegaly.  Normal imaged portions of the spleen, stomach, pancreas, adrenal glands, gallbladder, kidneys.  Bones/Musculoskeletal: Posterior eleventh left rib deformity is likely due to remote trauma. There also healed fractures of the sixth and fifth lateral left ribs, posttraumatic. Left-sided T6 hemangioma.  IMPRESSION: 1. Status post left mastectomy and axillary node dissection. No evidence of recurrent or metastatic disease within the chest. 2. Left fifth and sixth rib deformities, posttraumatic. The posterior eleventh left rib abnormality is technically indeterminate. 3. Hepatic steatosis.   Electronically Signed   By: KAbigail MiyamotoM.D.   On: 11/30/2013 13:28   Ct  Biopsy  12/09/2013   CLINICAL DATA:  53year old with history of breast cancer. The patient has painful bone lesions and needs a tissue diagnosis.  EXAM: CT GUIDED BIOPSY OF THE LEFT ILIAC BONE LESION  Physician: AStephan Minister Henn, MD  MEDICATIONS: 3 mg Versed, 150 mcg fentanyl. A radiology nurse monitored the patient for moderate sedation.  ANESTHESIA/SEDATION: Sedation time: 3 Min  PROCEDURE: The procedure was explained to the patient. The risks and benefits of the procedure were discussed and the patient's questions were addressed. Informed consent was obtained from the patient. The patient was placed prone on the CT scanner and images through the pelvis were obtained. The left side of the pelvis was prepped and draped in sterile fashion. Maximal barrier sterile technique was utilized including caps, mask, sterile gowns, sterile gloves, sterile drape, hand hygiene and skin antiseptic. 1% lidocaine was used to anesthetize the soft tissues and the bone cortex. The mixed sclerotic and lucent lesion involving the left ilium was targeted. An 11 gauge bone needle was directed onto the posterior aspect of the bone. A total of 4 fine needle aspirations were obtained with 20 gauge Franceen needles. The 20 gauge needles easily traversed through the lucent lesion and the bone. Subsequently, 2 core biopsies were attempted with the core bone biopsy device. The core specimens were placed in formalin. Needle was removed without complication. Bandage placed over the puncture site.  FINDINGS: There is a mixed sclerotic and lucent lesion involving the left ilium. There is destruction of the cortex and the 20 gauge needle easily traversed the bone. Again noted are lucent lesions involving the left inferior pubic ramus.  COMPLICATIONS: None  IMPRESSION: CT-guided biopsy of the left iliac bone lesion.   Electronically Signed   By: AMarkus DaftM.D.   On: 12/09/2013 08:09    ASSESSMENT:  #1. 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 #2. 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  #3. Upper GI bleeding secondary to NSAID gastritis, status post transfusion 2 units of packed red blood cells on 10/27/2013 with last hemoglobin on 10/28/2013 of 8.3, normal ferritin.  #4. Left upper extremity lymphedema, worsening.  #5. Depression with previous suicidal ideation, no longer evident   PLAN:  #1. In the presence of her husband and son, treatment was discussed. Ibrance plus letrozole will be given with ibrance 100 mg daily for 3 weeks out of every 4 and letrozole 2.5 mg daily. #2. Social service consult for Medicaid application. #3. Letrozole will be provided by the [facility] in today for through our indigent funding. #4. Chemotherapy teaching with Nurse Glennon Hamilton today. #5. Tentative followup in one month but the patient was told to call in her ibrance prescription is delivered to her home at which time she should bring the medication in with her for further discussion of how to take it properly. She will begin on letrozole immediately in the meantime. #6. Will add Xgeva in 6-8 weeks given monthly once a reproducible schedule of treatment as ascertained.  All questions were answered. The patient knows to call the clinic with any problems, questions or concerns. We can certainly see the patient much sooner if necessary.   I spent 30 minutes counseling the patient face to face. The total time spent in the appointment was 40 minutes.    Doroteo Bradford, MD 12/18/2013 12:07 PM  DISCLAIMER:  This note was dictated with voice recognition software.  Similar sounding words can inadvertently be transcribed inaccurately and may not be corrected upon review.

## 2013-12-21 ENCOUNTER — Telehealth (HOSPITAL_COMMUNITY): Payer: Self-pay | Admitting: Hematology and Oncology

## 2013-12-21 ENCOUNTER — Encounter: Payer: Self-pay | Admitting: *Deleted

## 2013-12-21 NOTE — Telephone Encounter (Signed)
Per Debbie @[employer]  pt was apprvd for free Ibrance until 12/22/14. [phone]  [facility] [phone]

## 2013-12-21 NOTE — Progress Notes (Signed)
[facility] Clinical Social Work  Clinical Social Work was referred by Environmental manager for assessment of psychosocial needs due to progression of disease, emotional and financial concerns as a result.   Clinical Social Worker contacted patient at home to offer support and assess for needs. CSW explained role of CSW including emotional support, adjustment to illness and assistance with community resources.   Pt reports she needs financial assistance and help with resources due to her current diagnosis. CSW explained the process of how to apply for medicaid and ss disability. CSW explained the differences in each program and where/how to apply for each. CSW explained that pt could apply online or in person at [place] or [facility]. CSW does not actually complete applications for patients. Pt stated understanding. CSW inquired if pt had met with a financial counselor at one of her previous hospitalizations. Pt denied working with a Development worker, community in the past. Often financial counselors will see patients inpatient that are self pay and discuss with them how to apply. Pt eager for counseling and emotional support. CSW reviewed resources and availability of CSW for brief, short-term counseling. Pt plans to discuss with her husband and then reach out to CSW to make an appointment for follow up. Pt appreciative of phone call and agrees to be in contact for CSW follow up.   Clinical Social Work interventions: Programme researcher, broadcasting/film/video education and referral  Brittany Blair, [place] Tuesdays 8:30-1pm Wednesdays 8:30-12pm  [phone]

## 2013-12-21 NOTE — Telephone Encounter (Signed)
pc to [employer] per [employer] additional info was needed. Advised pt only has two people living in the house. Per [employer] turn around time is 24-48.  [facility] [phone]

## 2013-12-21 NOTE — Progress Notes (Signed)
Entered in error

## 2013-12-23 ENCOUNTER — Telehealth (HOSPITAL_COMMUNITY): Payer: Self-pay | Admitting: *Deleted

## 2013-12-23 ENCOUNTER — Other Ambulatory Visit (HOSPITAL_COMMUNITY): Payer: Self-pay | Admitting: Oncology

## 2013-12-23 NOTE — Telephone Encounter (Signed)
Message left on patient's home answering machine regarding calling me back and letting me know the delivery date of her Leslee Home so that I can make the medication calendar for her.

## 2013-12-24 ENCOUNTER — Other Ambulatory Visit (HOSPITAL_COMMUNITY): Payer: Self-pay | Admitting: Hematology and Oncology

## 2013-12-24 ENCOUNTER — Encounter (HOSPITAL_COMMUNITY): Payer: Self-pay | Admitting: *Deleted

## 2013-12-24 DIAGNOSIS — C50319 Malignant neoplasm of lower-inner quadrant of unspecified female breast: Secondary | ICD-10-CM

## 2013-12-24 DIAGNOSIS — C50919 Malignant neoplasm of unspecified site of unspecified female breast: Secondary | ICD-10-CM

## 2013-12-24 NOTE — Progress Notes (Signed)
Distress screening done. Ibrance calendar given to patient's husband and reviewed with patient's husband.

## 2013-12-27 ENCOUNTER — Encounter (HOSPITAL_COMMUNITY): Payer: Self-pay

## 2013-12-28 ENCOUNTER — Encounter: Payer: Self-pay | Admitting: *Deleted

## 2013-12-28 NOTE — Progress Notes (Signed)
[facility] Psychosocial Distress Screening Clinical Social Work  Clinical Social Work was referred by distress screening protocol.  The patient scored a 6 on the Psychosocial Distress Thermometer which indicates moderate distress. Clinical Social Worker phoned pt to assess for distress and other psychosocial needs. Pt did not answer the phone and CSW had to leave a message. CSW will continue to reach out to pt as she had needed help with resources in the last week and CSW needs update as to what pt could accomplish.   ONCBCN DISTRESS SCREENING 12/24/2013  Distress experienced in past week (1-10) 6    Clinical Social Worker follow up needed: Yes.    If yes, follow up plan: See above Loren Racer, [place] Tuesdays 8:30-1pm Wednesdays 8:30-12pm  [phone]

## 2014-01-03 ENCOUNTER — Other Ambulatory Visit (HOSPITAL_COMMUNITY): Payer: Self-pay | Admitting: Oncology

## 2014-01-06 ENCOUNTER — Telehealth (HOSPITAL_COMMUNITY): Payer: Self-pay

## 2014-01-06 ENCOUNTER — Other Ambulatory Visit (HOSPITAL_COMMUNITY): Payer: Self-pay | Admitting: Hematology and Oncology

## 2014-01-06 MED ORDER — OXYCODONE HCL 10 MG PO TABS: ORAL_TABLET | ORAL | Status: DC

## 2014-01-06 NOTE — Telephone Encounter (Signed)
Message left that prescription for Oxycodone is ready for pick-up.

## 2014-01-06 NOTE — Telephone Encounter (Signed)
Refill  requested for oxycodone. 

## 2014-01-07 ENCOUNTER — Other Ambulatory Visit (HOSPITAL_COMMUNITY): Payer: Self-pay

## 2014-01-10 ENCOUNTER — Encounter (HOSPITAL_COMMUNITY): Payer: Self-pay | Admitting: Oncology

## 2014-01-10 ENCOUNTER — Other Ambulatory Visit (HOSPITAL_COMMUNITY): Payer: Self-pay

## 2014-01-10 NOTE — Progress Notes (Signed)
No PCP Per Patient No address on file  Carcinoma of breast metastatic to bone, unspecified laterality - Plan: CBC with Differential, Comprehensive metabolic panel, Cancer antigen 27.29, calcium-vitamin D (OSCAL WITH D) 500-200 MG-UNIT per tablet  Cancer of lower-inner quadrant of female breast, left - Plan: CBC with Differential, Comprehensive metabolic panel, CEA, Cancer antigen 27.29, calcium-vitamin D (OSCAL WITH D) 500-200 MG-UNIT per tablet  Bone metastases - Plan: CBC with Differential, Comprehensive metabolic panel, CEA, Cancer antigen 27.29, calcium-vitamin D (OSCAL WITH D) 500-200 MG-UNIT per tablet  CURRENT THERAPY: Ibrance plus letrozole will be given with ibrance 100 mg daily for 3 weeks out of every 4 and letrozole 2.5 mg daily.  To begin Xgeva today.  INTERVAL HISTORY: Brittany Blair 53 y.o. female returns for  regular  visit for followup of Metastatic breast cancer to bone after undergoing an iliac bone biopsy on 12/08/2013 demonstrating an ER/PR +, Her2 negative breast cancer.  She was started on Ibrance plus letrozole will be given with ibrance 100 mg daily for 3 weeks out of every 4 and letrozole 2.5 mg daily.      Carcinoma of breast metastatic to bone   05/28/2010 Initial Diagnosis Breast Cancer, IDC,Left, central, Stage III, receptor + Her2 -   12/08/2013 Progression Iliac bone biopsy consistent with adenocarcinoma, ER/PR positive, HER-2/neu not overexpressed   12/23/2013 -  Chemotherapy Ibrance 100 mg daily for 3 weeks out of every 4 and letrozole 2.5 mg daily.    Chemotherapy Xgeva every 28 days starting on 11/23   I personally reviewed and went over laboratory results with the patient.  The results are noted within this dictation.  The patient and husband had a number of elementary questions regarding the patient's cancer. Not sure if they failed to ask these questions, or did not understand previous answers these questions in the past so I spent a lot of time today  answering these questions in elementary terms: 1. What is the goal of treatment-the goal of treatment is to provide palliative chemotherapy for her metastatic breast cancer. Hopefully, treatment will shrink bony metastases providing discomfort relieved in addition to prolonging progression free survival and overall survival while maintaining quality of life. 2. What do cancer lesions look like-the best way to explain the appearance of these lesions was to review the patient's CT imaging and bone scan imaging in person with the patient and her husband. I was able to illustrate the location of her bony metastases in addition to showing her and illustrations of what they look like on CT scans. She is very appreciative of this information. 3. What is the difference between a tumor marker and a cancer marker-educated the patient that there is no difference. These are interchangeable terms for her CA 46. 46.  I've educated her on this particular tumor marker in addition to extraneous causes that can elevate this marker other than malignancy. Additionally, I've educated her on the paradoxical rise in cancer markers once embarking on therapy. With subsequent treatments, this marker should decline if she experiences a positive response to therapy. 4. How long will have to be on treatment-this may be a lifelong treatment and requiring lifelong intervention. She may have the opportunity of a chemotherapy holiday in the future but I am unable to provide her information regarding this since I'm not sure she will be a future candidate for a chemotherapy holiday. That is to be determined.  A provider education regarding her bony metastases and the fact that where  these metastases are located are areas that are at risk for fracture. As result of this, I recommend we treat the patient with Xgeva to decrease the risk of skeletal events. I reviewed the risks, benefits, alternatives, and side effects of this therapy; including  osteonecrosis of the jaw and hypocalcemia. I described the mechanism of action of this medication and the potential risks associated with the medicine. I printed a prescription for Os-Cal plus D and she is to take 2 tablets daily to prevent hypocalcemia. She is willing to initiate this intervention and we'll plan on doing that either today or in the very near future. Since she does not have a metabolic panel on file within the last 28 days, we will need to do that today to prove that her calcium is within normal limits before starting the medication. As a result, it may be beneficial for the patient to start Xgeva in the near future after doing laboratory work today. I will defer this to the patient and nursing staff.  With regards to tolerability of this new chemotherapeutic intervention, she complains of fatigue which I think is chronic for her. It may be a little below baseline but she reports that she is sleeping 16 hours a day approximately 2-3 times per week.  She otherwise denies any nausea or vomiting. I think in the future if this fatigue progresses or continues to be quality-of-life issue, we will need to consider changing her Ibrance therapy as this is a well known side effect of Ibrance.  Oncologically, the patient denies any complaints and ROS questioning is negative.   Past Medical History  Diagnosis Date  . Anxiety   . Herpes   . MRSA (methicillin resistant Staphylococcus aureus)   . Breast lump     left  . Hypertension   . Hematoma   . Swelling     left foot  . Multiple blisters     along surgical site  . Depression   . Genital herpes 08/04/2012  . PMB (postmenopausal bleeding) 06/23/2013  . Lymph edema   . Vaginal delivery 1981, 1984, 1987, 1992  . Breast cancer dx'd 05/27/2010    chemo/xrt comp 01/2011  . Carcinoma of breast metastatic to bone 05/28/2010    This patient presented in April with an invasive ductal carcinoma that had broken through the skin and was contaminated  with MRSA. She was stage III. It was lower inner quadrant. It was receptor positive, HER-2/neu negative, and had a KI-67 of 75%.  She was treated with neoadjuvant chemotherapy. She was recently hospitalized (June, 2012,) with multiple superficial MRSA infections. These have all r    has Carcinoma of breast metastatic to bone; Syncope; GAD (generalized anxiety disorder); HTN (hypertension), benign; Chronic chest pain; Depression; Transaminitis; Genital herpes; PMB (postmenopausal bleeding); Post-menopausal bleeding; Acute blood loss anemia; Abdominal pain; AKI (acute kidney injury); GIB (gastrointestinal bleeding); Syncope and collapse; and Prepyloric ulcer on her problem list.     is allergic to bee venom.  Ms. Slaugh had no medications administered during this visit.  Past Surgical History  Procedure Laterality Date  . Tubal ligation    . Wisdom tooth extraction    . Mastectomy modified radical  10/10/10     left -Dr Margot Chimes  . Laparoscopic assisted vaginal hysterectomy Bilateral 10/14/2013    Procedure: LAPAROSCOPIC ASSISTED VAGINAL HYSTERECTOMY;  Surgeon: Ena Dawley, MD;  Location: [facility];  Service: Gynecology;  Laterality: Bilateral;  . Cystoscopy N/A 10/14/2013    Procedure: CYSTOSCOPY;  Surgeon: Ena Dawley, MD;  Location: [facility];  Service: Gynecology;  Laterality: N/A;  . Laparoscopic lysis of adhesions N/A 10/14/2013    Procedure: LAPAROSCOPIC LYSIS OF ADHESIONS;  Surgeon: Ena Dawley, MD;  Location: [facility];  Service: Gynecology;  Laterality: N/A;  . Laparoscopic bilateral salpingo oopherectomy Bilateral 10/14/2013    Procedure: LAPAROSCOPIC BILATERAL SALPINGO OOPHORECTOMY;  Surgeon: Ena Dawley, MD;  Location: [facility];  Service: Gynecology;  Laterality: Bilateral;  . Esophagogastroduodenoscopy N/A 10/27/2013    Procedure: ESOPHAGOGASTRODUODENOSCOPY (EGD);  Surgeon: Wonda Horner, MD;  Location: [facility];  Service: Endoscopy;  Laterality: N/A;  . Bone biopsy Left 11/2013     left post. hip    Denies any headaches, dizziness, double vision, fevers, chills, night sweats, nausea, vomiting, diarrhea, constipation, chest pain, heart palpitations, shortness of breath, blood in stool, black tarry stool, urinary pain, urinary burning, urinary frequency, hematuria.   PHYSICAL EXAMINATION  ECOG PERFORMANCE STATUS: 2 - Symptomatic, <50% confined to bed  Filed Vitals:   01/14/14 1500  BP: 109/77  Pulse: 90  Temp: 98.3 F (36.8 C)  Resp: 20    GENERAL:alert, no distress, well nourished, well developed, comfortable, cooperative, obese and smiling SKIN: skin color, texture, turgor are normal, no rashes or significant lesions HEAD: Normocephalic, No masses, lesions, tenderness or abnormalities EYES: normal, PERRLA, EOMI, Conjunctiva are pink and non-injected EARS: External ears normal OROPHARYNX:lips, buccal mucosa, and tongue normal  NECK: supple, trachea midline LYMPH:  not examined BREAST:not examined LUNGS: Not examined HEART: not examined ABDOMEN:not examined BACK: Back symmetric, no curvature. EXTREMITIES:less then 2 second capillary refill, no skin discoloration, no cyanosis  NEURO: alert & oriented x 3 with fluent speech, no focal motor/sensory deficits, in a wheelchair    LABORATORY DATA: CBC    Component Value Date/Time   WBC 5.2 12/08/2013 0917   WBC 7.1 01/24/2012 1539   RBC 3.59* 12/08/2013 0917   RBC 3.00* 10/27/2013 0800   RBC 4.39 01/24/2012 1539   HGB 12.0 12/08/2013 0917   HGB 14.7 01/24/2012 1539   HCT 36.0 12/08/2013 0917   HCT 43.7 01/24/2012 1539   PLT 285 12/08/2013 0917   PLT 258 01/24/2012 1539   MCV 100.3* 12/08/2013 0917   MCV 99.5 01/24/2012 1539   MCH 33.4 12/08/2013 0917   MCH 33.5 01/24/2012 1539   MCHC 33.3 12/08/2013 0917   MCHC 33.6 01/24/2012 1539   RDW 15.8* 12/08/2013 0917   RDW 14.1 01/24/2012 1539   LYMPHSABS 1.2 11/02/2013 1506   LYMPHSABS 1.4 01/24/2012 1539   MONOABS 0.9 11/02/2013 1506   MONOABS  0.6 01/24/2012 1539   EOSABS 0.7 11/02/2013 1506   EOSABS 0.3 01/24/2012 1539   BASOSABS 0.1 11/02/2013 1506   BASOSABS 0.0 01/24/2012 1539      Chemistry      Component Value Date/Time   NA 139 11/02/2013 1506   K 4.2 11/02/2013 1506   CL 99 11/02/2013 1506   CO2 23 11/02/2013 1506   BUN 4* 11/02/2013 1506   CREATININE 0.74 11/02/2013 1506      Component Value Date/Time   CALCIUM 8.5 11/02/2013 1506   ALKPHOS 162* 11/02/2013 1506   AST 94* 11/02/2013 1506   ALT 43* 11/02/2013 1506   BILITOT 0.4 11/02/2013 1506     Lab Results  Component Value Date   LABCA2 53* 11/02/2013      ASSESSMENT:  1. Metastatic breast cancer to bone after undergoing an iliac bone biopsy on 12/08/2013 demonstrating an ER/PR +, Her2  negative breast cancer.  She was started on Ibrance plus letrozole will be given with ibrance 100 mg daily for 3 weeks out of every 4 and letrozole 2.5 mg daily.  2. 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  3. Upper GI bleeding secondary to NSAID gastritis, status post transfusion 2 units of packed red blood cells on 10/27/2013 with last hemoglobin on 10/28/2013 of 8.3, normal ferritin.  4. Left upper extremity lymphedema, worsening.  5. Depression with previous suicidal ideation, no longer evident  Patient Active Problem List   Diagnosis Date Noted  . GIB (gastrointestinal bleeding) 10/27/2013  . Syncope and collapse 10/27/2013  . Prepyloric ulcer 10/27/2013  . Acute blood loss anemia 10/26/2013  . Abdominal pain 10/26/2013  . AKI (acute kidney injury) 10/26/2013  . Post-menopausal bleeding 10/14/2013  . PMB (postmenopausal bleeding) 06/23/2013  . Genital herpes 08/04/2012  . Syncope 04/28/2012  . GAD (generalized anxiety disorder) 04/28/2012  . HTN (hypertension), benign 04/28/2012  . Chronic chest pain 04/28/2012  . Depression 04/28/2012  . Transaminitis 04/28/2012  . Carcinoma of breast metastatic to bone  05/28/2010    Class: Stage 3     PLAN:  1. I personally reviewed and went over laboratory results with the patient.  The results are noted within this dictation. 2. Labs today: CBC diff, CMET, CA 27.29, CEA 3. Labs in 4 weeks: CBC diff, CMET, CA 27.29 4. Patient education as outlined above. 5. Discussed the risks, benefits, alternatives, and side effects of Xgeva. 6. Rx for Oscal D twice daily provided. 7. Start Xgeva on 11/23 8. Finished 21 days of Ibrance of cycle 1 on 01/13/2014.  Continue Letrozole daily 9. Restaging scans after 3 cycles of therapy. 10. Return in 4 weeks for follow-up   THERAPY PLAN:  She will continue with Ibrance + Letrozole and we will monitor for response and be on the look out for treatment failure and progression.  We will manage toxicities as they arise.  All questions were answered. The patient knows to call the clinic with any problems, questions or concerns. We can certainly see the patient much sooner if necessary.  Patient and plan discussed with Dr. Farrel Gobble and he is in agreement with the aforementioned.   I spent 40 minutes counseling the patient face to face. The total time spent in the appointment was 55 minutes.  KEFALAS,THOMAS 01/14/2014

## 2014-01-14 ENCOUNTER — Ambulatory Visit (HOSPITAL_COMMUNITY): Payer: Self-pay

## 2014-01-14 ENCOUNTER — Encounter (HOSPITAL_COMMUNITY): Payer: Self-pay | Attending: Oncology | Admitting: Oncology

## 2014-01-14 VITALS — BP 109/77 | HR 90 | Temp 98.3°F | Resp 20 | Wt 176.2 lb

## 2014-01-14 DIAGNOSIS — F411 Generalized anxiety disorder: Secondary | ICD-10-CM | POA: Insufficient documentation

## 2014-01-14 DIAGNOSIS — R978 Other abnormal tumor markers: Secondary | ICD-10-CM | POA: Insufficient documentation

## 2014-01-14 DIAGNOSIS — C50919 Malignant neoplasm of unspecified site of unspecified female breast: Secondary | ICD-10-CM

## 2014-01-14 DIAGNOSIS — Z79811 Long term (current) use of aromatase inhibitors: Secondary | ICD-10-CM

## 2014-01-14 DIAGNOSIS — C7951 Secondary malignant neoplasm of bone: Secondary | ICD-10-CM | POA: Insufficient documentation

## 2014-01-14 DIAGNOSIS — C50312 Malignant neoplasm of lower-inner quadrant of left female breast: Secondary | ICD-10-CM | POA: Insufficient documentation

## 2014-01-14 DIAGNOSIS — R938 Abnormal findings on diagnostic imaging of other specified body structures: Secondary | ICD-10-CM | POA: Insufficient documentation

## 2014-01-14 LAB — COMPREHENSIVE METABOLIC PANEL
ALK PHOS: 157 U/L — AB (ref 39–117)
ALT: 36 U/L — ABNORMAL HIGH (ref 0–35)
ANION GAP: 16 — AB (ref 5–15)
AST: 112 U/L — ABNORMAL HIGH (ref 0–37)
Albumin: 3.5 g/dL (ref 3.5–5.2)
BILIRUBIN TOTAL: 0.9 mg/dL (ref 0.3–1.2)
BUN: 6 mg/dL (ref 6–23)
CHLORIDE: 97 meq/L (ref 96–112)
CO2: 22 mEq/L (ref 19–32)
Calcium: 9.1 mg/dL (ref 8.4–10.5)
Creatinine, Ser: 0.73 mg/dL (ref 0.50–1.10)
GFR calc non Af Amer: >90 mL/min (ref >90)
GLUCOSE: 101 mg/dL — AB (ref 70–99)
Potassium: 4 mEq/L (ref 3.7–5.3)
Sodium: 135 mEq/L — ABNORMAL LOW (ref 137–147)
Total Protein: 7.7 g/dL (ref 6.0–8.3)

## 2014-01-14 LAB — CBC WITH DIFFERENTIAL/PLATELET
Basophils Absolute: 0.1 10*3/uL (ref 0.0–0.1)
Basophils Relative: 2 % — ABNORMAL HIGH (ref 0–1)
EOS ABS: 0.2 10*3/uL (ref 0.0–0.7)
Eosinophils Relative: 6 % — ABNORMAL HIGH (ref 0–5)
HEMATOCRIT: 36.9 % (ref 36.0–46.0)
HEMOGLOBIN: 12.6 g/dL (ref 12.0–15.0)
LYMPHS ABS: 0.9 10*3/uL (ref 0.7–4.0)
Lymphocytes Relative: 27 % (ref 12–46)
MCH: 34.7 pg — ABNORMAL HIGH (ref 26.0–34.0)
MCHC: 34.1 g/dL (ref 30.0–36.0)
MCV: 101.7 fL — ABNORMAL HIGH (ref 78.0–100.0)
MONOS PCT: 10 % (ref 3–12)
Monocytes Absolute: 0.3 10*3/uL (ref 0.1–1.0)
NEUTROS ABS: 1.8 10*3/uL (ref 1.7–7.7)
Neutrophils Relative %: 55 % (ref 43–77)
Platelets: 326 10*3/uL (ref 150–400)
RBC: 3.63 MIL/uL — ABNORMAL LOW (ref 3.87–5.11)
RDW: 16.2 % — ABNORMAL HIGH (ref 11.5–15.5)
WBC: 3.2 10*3/uL — ABNORMAL LOW (ref 4.0–10.5)

## 2014-01-14 MED ORDER — CALCIUM CARBONATE-VITAMIN D 500-200 MG-UNIT PO TABS: 2.0000 | ORAL_TABLET | Freq: Every day | ORAL | Status: DC

## 2014-01-14 NOTE — Progress Notes (Signed)
Brittany Blair presented for labwork. Labs per MD order drawn via Peripheral Line 23 gauge needle inserted in right hand  Good blood return present. Procedure without incident.  Needle removed intact. Patient tolerated procedure well.   

## 2014-01-14 NOTE — Patient Instructions (Addendum)
[facility] Discharge Instructions  RECOMMENDATIONS MADE BY THE CONSULTANT AND ANY TEST RESULTS WILL BE SENT TO YOUR REFERRING PHYSICIAN.  Continue Ibrance and Letrozole.  Leslee Home is taken daily x 21 days with a 1 week break.  Letrozole continue daily even on your days off from [place]. We will perform laboratory work today. We will start a medicine called Xgeva. This medication is to reduce the risk of fracture. Information regarding this medication follows below. Delton See is given every 28 days (every 4 weeks). We'll continue to perform laboratory work every 4 weeks. We reviewed your imaging scans today. Take Calcium 1000- 1200 mg and Vit D 400-800 units to prevent low calcium level. Please call us with any questions, issues, or concerns regarding her cancer.  Thank you for choosing [facility] to provide your oncology and hematology care.  To afford each patient quality time with our providers, please arrive at least 15 minutes before your scheduled appointment time.  With your help, our goal is to use those 15 minutes to complete the necessary work-up to ensure our physicians have the information they need to help with your evaluation and healthcare recommendations.    Effective January 1st, 2014, we ask that you re-schedule your appointment with our physicians should you arrive 10 or more minutes late for your appointment.  We strive to give you quality time with our providers, and arriving late affects you and other patients whose appointments are after yours.    Again, thank you for choosing [facility].  Our hope is that these requests will decrease the amount of time that you wait before being seen by our physicians.       _____________________________________________________________  Should you have questions after your visit to [facility], please contact our office at [phone] between the hours of 8:30 a.m. and 5:00 p.m.   Voicemails left after 4:30 p.m. will not be returned until the following business day.  For prescription refill requests, have your pharmacy contact our office with your prescription refill request.      Denosumab injection What is this medicine? DENOSUMAB (den oh sue mab) slows bone breakdown. Prolia is used to treat osteoporosis in women after menopause and in men. Delton See is used to prevent bone fractures and other bone problems caused by cancer bone metastases. Delton See is also used to treat giant cell tumor of the bone. This medicine may be used for other purposes; ask your health care provider or pharmacist if you have questions. COMMON BRAND NAME(S): Prolia, XGEVA What should I tell my health care provider before I take this medicine? They need to know if you have any of these conditions: -dental disease -eczema -infection or history of infections -kidney disease or on dialysis -low blood calcium or vitamin D -malabsorption syndrome -scheduled to have surgery or tooth extraction -taking medicine that contains denosumab -thyroid or parathyroid disease -an unusual reaction to denosumab, other medicines, foods, dyes, or preservatives -pregnant or trying to get pregnant -breast-feeding How should I use this medicine? This medicine is for injection under the skin. It is given by a health care professional in a hospital or clinic setting. If you are getting Prolia, a special MedGuide will be given to you by the pharmacist with each prescription and refill. Be sure to read this information carefully each time. For Prolia, talk to your pediatrician regarding the use of this medicine in children. Special care may be needed. For Delton See, talk  to your pediatrician regarding the use of this medicine in children. While this drug may be prescribed for children as young as 13 years for selected conditions, precautions do apply. Overdosage: If you think you've taken too much of this medicine contact a  poison control center or emergency room at once. Overdosage: If you think you have taken too much of this medicine contact a poison control center or emergency room at once. NOTE: This medicine is only for you. Do not share this medicine with others. What if I miss a dose? It is important not to miss your dose. Call your doctor or health care professional if you are unable to keep an appointment. What may interact with this medicine? Do not take this medicine with any of the following medications: -other medicines containing denosumab This medicine may also interact with the following medications: -medicines that suppress the immune system -medicines that treat cancer -steroid medicines like prednisone or cortisone This list may not describe all possible interactions. Give your health care provider a list of all the medicines, herbs, non-prescription drugs, or dietary supplements you use. Also tell them if you smoke, drink alcohol, or use illegal drugs. Some items may interact with your medicine. What should I watch for while using this medicine? Visit your doctor or health care professional for regular checks on your progress. Your doctor or health care professional may order blood tests and other tests to see how you are doing. Call your doctor or health care professional if you get a cold or other infection while receiving this medicine. Do not treat yourself. This medicine may decrease your body's ability to fight infection. You should make sure you get enough calcium and vitamin D while you are taking this medicine, unless your doctor tells you not to. Discuss the foods you eat and the vitamins you take with your health care professional. See your dentist regularly. Brush and floss your teeth as directed. Before you have any dental work done, tell your dentist you are receiving this medicine. Do not become pregnant while taking this medicine or for 5 months after stopping it. Women should  inform their doctor if they wish to become pregnant or think they might be pregnant. There is a potential for serious side effects to an unborn child. Talk to your health care professional or pharmacist for more information. What side effects may I notice from receiving this medicine? Side effects that you should report to your doctor or health care professional as soon as possible: -allergic reactions like skin rash, itching or hives, swelling of the face, lips, or tongue -breathing problems -chest pain -fast, irregular heartbeat -feeling faint or lightheaded, falls -fever, chills, or any other sign of infection -muscle spasms, tightening, or twitches -numbness or tingling -skin blisters or bumps, or is dry, peels, or red -slow healing or unexplained pain in the mouth or jaw -unusual bleeding or bruising Side effects that usually do not require medical attention (Report these to your doctor or health care professional if they continue or are bothersome.): -muscle pain -stomach upset, gas This list may not describe all possible side effects. Call your doctor for medical advice about side effects. You may report side effects to FDA at 1-800-FDA-1088. Where should I keep my medicine? This medicine is only given in a clinic, doctor's office, or other health care setting and will not be stored at home. NOTE: This sheet is a summary. It may not cover all possible information. If you have questions about  this medicine, talk to your doctor, pharmacist, or health care provider.  2015, [employer]. (2011-08-12 12:37:47)

## 2014-01-15 LAB — CANCER ANTIGEN 27.29: CA 27.29: 64 U/mL — ABNORMAL HIGH (ref 0–39)

## 2014-01-15 LAB — CEA: CEA: 5.8 ng/mL — AB (ref 0.0–5.0)

## 2014-01-17 ENCOUNTER — Encounter (HOSPITAL_BASED_OUTPATIENT_CLINIC_OR_DEPARTMENT_OTHER): Payer: Self-pay

## 2014-01-17 ENCOUNTER — Encounter (HOSPITAL_COMMUNITY): Payer: Self-pay

## 2014-01-17 DIAGNOSIS — C50919 Malignant neoplasm of unspecified site of unspecified female breast: Secondary | ICD-10-CM

## 2014-01-17 DIAGNOSIS — C50312 Malignant neoplasm of lower-inner quadrant of left female breast: Secondary | ICD-10-CM

## 2014-01-17 DIAGNOSIS — C7951 Secondary malignant neoplasm of bone: Secondary | ICD-10-CM

## 2014-01-17 MED ORDER — DENOSUMAB 120 MG/1.7ML ~~LOC~~ SOLN
120.0000 mg | Freq: Once | SUBCUTANEOUS | Status: AC
  Administered 2014-01-17: 120 mg via SUBCUTANEOUS
  Filled 2014-01-17: qty 1.7

## 2014-01-17 NOTE — Patient Instructions (Signed)
[facility] Discharge Instructions  RECOMMENDATIONS MADE BY THE CONSULTANT AND ANY TEST RESULTS WILL BE SENT TO YOUR REFERRING PHYSICIAN.  Today you were given Xgeva. Please follow up as scheduled.  Please call for any questions or concerns.    Thank you for choosing [facility] to provide your oncology and hematology care.  To afford each patient quality time with our providers, please arrive at least 15 minutes before your scheduled appointment time.  With your help, our goal is to use those 15 minutes to complete the necessary work-up to ensure our physicians have the information they need to help with your evaluation and healthcare recommendations.    Effective January 1st, 2014, we ask that you re-schedule your appointment with our physicians should you arrive 10 or more minutes late for your appointment.  We strive to give you quality time with our providers, and arriving late affects you and other patients whose appointments are after yours.    Again, thank you for choosing [facility].  Our hope is that these requests will decrease the amount of time that you wait before being seen by our physicians.       _____________________________________________________________  Should you have questions after your visit to [facility], please contact our office at [phone] between the hours of 8:30 a.m. and 4:30 p.m.  Voicemails left after 4:30 p.m. will not be returned until the following business day.  For prescription refill requests, have your pharmacy contact our office with your prescription refill request.    _______________________________________________________________  We hope that we have given you very good care.  You may receive a patient satisfaction survey in the mail, please complete it and return it as soon as possible.  We value your  feedback!  _______________________________________________________________  Have you asked about our STAR program?  STAR stands for Survivorship Training and Rehabilitation, and this is a nationally recognized cancer care program that focuses on survivorship and rehabilitation.  Cancer and cancer treatments may cause problems, such as, pain, making you feel tired and keeping you from doing the things that you need or want to do. Cancer rehabilitation can help. Our goal is to reduce these troubling effects and help you have the best quality of life possible.  You may receive a survey from a nurse that asks questions about your current state of health.  Based on the survey results, all eligible patients will be referred to the [facility] program for an evaluation so we can better serve you!  A frequently asked questions sheet is available upon request.

## 2014-01-17 NOTE — Progress Notes (Signed)
Brittany Blair presents today for injection per the provider's orders.  Xgeva administration without incident; see MAR for injection details.  Patient tolerated procedure well and without incident.  No questions or complaints noted at this time.

## 2014-01-21 ENCOUNTER — Telehealth: Payer: Self-pay | Admitting: Nutrition

## 2014-01-21 NOTE — Telephone Encounter (Signed)
Patient was identified at risk for malnutrition on the MST. Contacted patient by phone and left message. Patient given my contact information if she desires nutrition intervention.

## 2014-01-25 ENCOUNTER — Other Ambulatory Visit (HOSPITAL_COMMUNITY): Payer: Self-pay | Admitting: Oncology

## 2014-01-25 DIAGNOSIS — C50919 Malignant neoplasm of unspecified site of unspecified female breast: Secondary | ICD-10-CM

## 2014-01-25 DIAGNOSIS — C7951 Secondary malignant neoplasm of bone: Principal | ICD-10-CM

## 2014-01-25 MED ORDER — OXYCODONE HCL 10 MG PO TABS: ORAL_TABLET | ORAL | Status: DC

## 2014-02-04 ENCOUNTER — Telehealth (HOSPITAL_COMMUNITY): Payer: Self-pay | Admitting: Oncology

## 2014-02-04 ENCOUNTER — Other Ambulatory Visit (HOSPITAL_COMMUNITY): Payer: Self-pay | Admitting: Oncology

## 2014-02-04 DIAGNOSIS — C50919 Malignant neoplasm of unspecified site of unspecified female breast: Secondary | ICD-10-CM

## 2014-02-04 DIAGNOSIS — C7951 Secondary malignant neoplasm of bone: Principal | ICD-10-CM

## 2014-02-04 MED ORDER — OXYCODONE HCL 10 MG PO TABS: ORAL_TABLET | ORAL | Status: DC

## 2014-02-11 ENCOUNTER — Ambulatory Visit (HOSPITAL_COMMUNITY): Payer: Self-pay

## 2014-02-11 ENCOUNTER — Ambulatory Visit (HOSPITAL_COMMUNITY): Payer: Self-pay | Admitting: Oncology

## 2014-02-11 ENCOUNTER — Other Ambulatory Visit (HOSPITAL_COMMUNITY): Payer: Self-pay

## 2014-02-12 NOTE — Assessment & Plan Note (Addendum)
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  Tolerating well thus far.  On Xgeva therapy for bone metastases with Ca++ and Vit D to prevent hypocalcemia associated with Xgeva.  Will restage with CT CAP after 3 cycles and continue to monitor for biochemical response to therapy.  CT CAP in approximately 4 weeks time.  Will also continue with pain control.  Return in 4 weeks following CT CAP.

## 2014-02-12 NOTE — Progress Notes (Signed)
No PCP Per Patient No address on file  Carcinoma of breast metastatic to bone, unspecified laterality - Plan: CBC with Differential, Comprehensive metabolic panel, Cancer antigen 27.29, CT Abdomen Pelvis W Contrast, CT Chest W Contrast, CT Head W Wo Contrast, Oxycodone HCl 10 MG TABS  GAD (generalized anxiety disorder)  HTN (hypertension), benign - Plan: amLODipine (NORVASC) 5 MG tablet  Left sided numbness  Hypotension due to drugs  Hypocalcemia  Infiltrating ductal carcinoma of left breast  CURRENT THERAPY:  Ibrance plus letrozole will be given with ibrance 100 mg daily for 3 weeks out of every 4 and letrozole 2.5 mg daily. Xgeva beginning on 01/17/2014.  INTERVAL HISTORY: Brittany Blair 53 y.o. female returns for followup of Metastatic breast cancer to bone after undergoing an iliac bone biopsy on 12/08/2013 demonstrating an ER/PR +, Her2 negative breast cancer. She was started on Ibrance plus letrozole will be given with ibrance 100 mg daily for 3 weeks out of every 4 and letrozole 2.5 mg daily.     Carcinoma of breast metastatic to bone   12/08/2013 Progression Iliac bone biopsy consistent with adenocarcinoma, ER/PR positive, HER-2/neu not overexpressed   12/23/2013 -  Chemotherapy Ibrance 100 mg daily for 3 weeks out of every 4 and letrozole 2.5 mg daily.   01/17/2014 Miscellaneous Xgeva 120 mg SQ every 28 days for bone metastases.   I personally reviewed and went over laboratory results with the patient.  The results are noted within this dictation.  She notes a left sided numbness that started days-weeks ago.  She denies a slurred speech.  Symmetry of face is intact.  Facial strength is intact.  She notes that it is new since her last office visit.  She admits to thinking and speech incongruence (saying the incorrect word).  Physical exam is impressive for left LE weakness compared to right side with hip flexion only.  This needs radiographic work-up but she  notes that she does not want to have an MRI, I am mildly claustrophobic.  We will start with CT imaging of head.  Since this is ongoing for sometime, it can be done as an outpatient this week.  Otherwise, she is tolerating therapy well without any major issues.  ROS questioning is negative.   Past Medical History  Diagnosis Date  . Anxiety   . Herpes   . MRSA (methicillin resistant Staphylococcus aureus)   . Breast lump     left  . Hypertension   . Hematoma   . Swelling     left foot  . Multiple blisters     along surgical site  . Depression   . Genital herpes 08/04/2012  . PMB (postmenopausal bleeding) 06/23/2013  . Lymph edema   . Vaginal delivery 1981, 1984, 1987, 1992  . Breast cancer dx'd 05/27/2010    chemo/xrt comp 01/2011  . Carcinoma of breast metastatic to bone 05/28/2010    This patient presented in April with an invasive ductal carcinoma that had broken through the skin and was contaminated with MRSA. She was stage III. It was lower inner quadrant. It was receptor positive, HER-2/neu negative, and had a KI-67 of 75%.  She was treated with neoadjuvant chemotherapy. She was recently hospitalized (June, 2012,) with multiple superficial MRSA infections. These have all r    has Carcinoma of breast metastatic to bone; Syncope; GAD (generalized anxiety disorder); HTN (hypertension), benign; Chronic chest pain; Depression; Transaminitis; Genital herpes; PMB (postmenopausal bleeding); Acute blood loss  anemia; Abdominal pain; AKI (acute kidney injury); GIB (gastrointestinal bleeding); Syncope and collapse; Prepyloric ulcer; Left sided numbness; Hypotension; Hypocalcemia; and Infiltrating ductal carcinoma of left breast on her problem list.     is allergic to bee venom.  Brittany Blair had no medications administered during this visit.  Past Surgical History  Procedure Laterality Date  . Tubal ligation    . Wisdom tooth extraction    . Mastectomy modified radical  10/10/10     left -Dr  Brittany Blair  . Laparoscopic assisted vaginal hysterectomy Bilateral 10/14/2013    Procedure: LAPAROSCOPIC ASSISTED VAGINAL HYSTERECTOMY;  Surgeon: Brittany Dawley, MD;  Location: [facility];  Service: Gynecology;  Laterality: Bilateral;  . Cystoscopy N/A 10/14/2013    Procedure: CYSTOSCOPY;  Surgeon: Brittany Dawley, MD;  Location: [facility];  Service: Gynecology;  Laterality: N/A;  . Laparoscopic lysis of adhesions N/A 10/14/2013    Procedure: LAPAROSCOPIC LYSIS OF ADHESIONS;  Surgeon: Brittany Dawley, MD;  Location: [facility];  Service: Gynecology;  Laterality: N/A;  . Laparoscopic bilateral salpingo oopherectomy Bilateral 10/14/2013    Procedure: LAPAROSCOPIC BILATERAL SALPINGO OOPHORECTOMY;  Surgeon: Brittany Dawley, MD;  Location: [facility];  Service: Gynecology;  Laterality: Bilateral;  . Esophagogastroduodenoscopy N/A 10/27/2013    Procedure: ESOPHAGOGASTRODUODENOSCOPY (EGD);  Surgeon: Brittany Horner, MD;  Location: [facility];  Service: Endoscopy;  Laterality: N/A;  . Bone biopsy Left 11/2013    left post. hip    Denies any headaches, dizziness, double vision, fevers, chills, night sweats, nausea, vomiting, diarrhea, constipation, chest pain, heart palpitations, shortness of breath, blood in stool, black tarry stool, urinary pain, urinary burning, urinary frequency, hematuria.   PHYSICAL EXAMINATION  ECOG PERFORMANCE STATUS: 2 - Symptomatic, <50% confined to bed  Filed Vitals:   02/14/14 1300  BP: 95/47  Pulse: 85  Temp: 97.4 F (36.3 C)  Resp: 18    GENERAL:alert, no distress, well nourished, well developed, comfortable, cooperative and smiling SKIN: skin color, texture, turgor are normal, no rashes or significant lesions HEAD: Normocephalic, No masses, lesions, tenderness or abnormalities EYES: normal, PERRLA, EOMI, Conjunctiva are pink and non-injected EARS: External ears normal OROPHARYNX:lips, buccal mucosa, and tongue normal and mucous membranes are moist  NECK: supple, no adenopathy,  thyroid normal size, non-tender, without nodularity, no stridor, non-tender, trachea midline LYMPH:  no palpable lymphadenopathy BREAST:not examined LUNGS: clear to auscultation  HEART: regular rate & rhythm ABDOMEN:abdomen soft and normal bowel sounds BACK: Back symmetric, no curvature. EXTREMITIES:less then 2 second capillary refill, no joint deformities, effusion, or inflammation, no cyanosis  NEURO: alert & oriented x 3 with fluent speech, positive findings: muscular weakness flexion of left leg, sensory deficit left arm, left leg, left thorax, in wheelchair which is typical.    LABORATORY DATA: CBC    Component Value Date/Time   WBC 2.9* 02/14/2014 1331   WBC 7.1 01/24/2012 1539   RBC 3.00* 02/14/2014 1331   RBC 3.00* 10/27/2013 0800   RBC 4.39 01/24/2012 1539   HGB 11.0* 02/14/2014 1331   HGB 14.7 01/24/2012 1539   HCT 33.5* 02/14/2014 1331   HCT 43.7 01/24/2012 1539   PLT 297 02/14/2014 1331   PLT 258 01/24/2012 1539   MCV 111.7* 02/14/2014 1331   MCV 99.5 01/24/2012 1539   MCH 36.7* 02/14/2014 1331   MCH 33.5 01/24/2012 1539   MCHC 32.8 02/14/2014 1331   MCHC 33.6 01/24/2012 1539   RDW 18.3* 02/14/2014 1331   RDW 14.1 01/24/2012 1539   LYMPHSABS 1.0 02/14/2014 1331   LYMPHSABS  1.4 01/24/2012 1539   MONOABS 0.4 02/14/2014 1331   MONOABS 0.6 01/24/2012 1539   EOSABS 0.2 02/14/2014 1331   EOSABS 0.3 01/24/2012 1539   BASOSABS 0.1 02/14/2014 1331   BASOSABS 0.0 01/24/2012 1539      Chemistry      Component Value Date/Time   NA 135* 02/14/2014 1331   K 5.0 02/14/2014 1331   CL 99 02/14/2014 1331   CO2 21 02/14/2014 1331   BUN 4* 02/14/2014 1331   CREATININE 0.60 02/14/2014 1331      Component Value Date/Time   CALCIUM 7.2* 02/14/2014 1331   ALKPHOS 135* 02/14/2014 1331   AST 120* 02/14/2014 1331   ALT 39* 02/14/2014 1331   BILITOT 0.7 02/14/2014 1331     Lab Results  Component Value Date   LABCA2 64* 01/14/2014      ASSESSMENT AND PLAN:    Carcinoma of breast metastatic to bone ER/PR+, Her2 negative malignancy, on Letrozole and Ibrance 100 mg daily 3 weeks on and 1 week off every 4 weeks.  Tolerating well thus far.  On Xgeva therapy for bone metastases with Ca++ and Vit D to prevent hypocalcemia associated with Xgeva.  Will restage with CT CAP after 3 cycles and continue to monitor for biochemical response to therapy.  CT CAP in approximately 4 weeks time.  Will also continue with pain control.  Return in 4 weeks following CT CAP.  GAD (generalized anxiety disorder) On anti-anxiolytics.  Controlled.  Left sided numbness New since last appointment.  She cannot tell me when this started, but it has been weeks.  Strength exam is positive for weakness on the left lower extremity with normal strength in upper extremities.  She admits to saying incorrect words.  She is claustrophobic and declines MRI scan today, but agreeable to CT of head imaging.  Hypotension Will decrease Norvasc to 5 mg ONCE daily (compared to BID).  Hypocalcemia Will hold Xgeva today for a correct calcium level of 7.9.  She is encouraged to continue with Ca++ and Vit D at home as prescribed.  Infiltrating ductal carcinoma of left breast Infiltrating ductal carcinoma the left breast, grade 3, stage IIIA (downstaged from probable stage IIIB with what sounds like a T4B cancer at presentation). Her cancer was going through the skin she states. She was given Taxotere x3 cycles preoperatively in a dose dense fashion followed by 2 cycles of dose dense FEC. Her chemotherapy was interrupted because of the severe MRSA infection prompting discontinuation. At the time of definitive surgery she still had a 4.5 cm cancer with LV I and 7 of 8 positive lymph nodes. ER receptors 61% positive, PR receptors 100% positive, HER-2/neu nonamplified, Ki-67 are high at 75%.   THERAPY PLAN:  She will continue with Ibrance + Letrozole and we will monitor for response and be on the look out  for treatment failure and progression. We will manage toxicities as they arise.  CT CAP with contrast and head with and without contrast in 4 weeks to evaluate response to therapy.  With her new neurologic complaint, CT head with and without contrast is needed.  All questions were answered. The patient knows to call the clinic with any problems, questions or concerns. We can certainly see the patient much sooner if necessary.  Patient and plan discussed with Dr. Ancil Linsey and she is in agreement with the aforementioned.   KEFALAS,THOMAS 02/14/2014

## 2014-02-12 NOTE — Assessment & Plan Note (Signed)
On anti-anxiolytics.  Controlled.

## 2014-02-14 ENCOUNTER — Encounter (HOSPITAL_BASED_OUTPATIENT_CLINIC_OR_DEPARTMENT_OTHER): Payer: Self-pay

## 2014-02-14 ENCOUNTER — Encounter (HOSPITAL_COMMUNITY): Payer: Self-pay | Attending: Oncology | Admitting: Oncology

## 2014-02-14 ENCOUNTER — Encounter (HOSPITAL_COMMUNITY): Payer: Self-pay | Admitting: Oncology

## 2014-02-14 ENCOUNTER — Encounter (HOSPITAL_COMMUNITY): Payer: Self-pay

## 2014-02-14 VITALS — BP 95/47 | HR 85 | Temp 97.4°F | Resp 18 | Wt 184.8 lb

## 2014-02-14 DIAGNOSIS — C7951 Secondary malignant neoplasm of bone: Secondary | ICD-10-CM | POA: Insufficient documentation

## 2014-02-14 DIAGNOSIS — F411 Generalized anxiety disorder: Secondary | ICD-10-CM | POA: Insufficient documentation

## 2014-02-14 DIAGNOSIS — C50919 Malignant neoplasm of unspecified site of unspecified female breast: Secondary | ICD-10-CM

## 2014-02-14 DIAGNOSIS — R938 Abnormal findings on diagnostic imaging of other specified body structures: Secondary | ICD-10-CM | POA: Insufficient documentation

## 2014-02-14 DIAGNOSIS — I10 Essential (primary) hypertension: Secondary | ICD-10-CM

## 2014-02-14 DIAGNOSIS — C50312 Malignant neoplasm of lower-inner quadrant of left female breast: Secondary | ICD-10-CM

## 2014-02-14 DIAGNOSIS — I952 Hypotension due to drugs: Secondary | ICD-10-CM

## 2014-02-14 DIAGNOSIS — I959 Hypotension, unspecified: Secondary | ICD-10-CM | POA: Insufficient documentation

## 2014-02-14 DIAGNOSIS — R200 Anesthesia of skin: Secondary | ICD-10-CM | POA: Insufficient documentation

## 2014-02-14 DIAGNOSIS — R978 Other abnormal tumor markers: Secondary | ICD-10-CM | POA: Insufficient documentation

## 2014-02-14 DIAGNOSIS — C50912 Malignant neoplasm of unspecified site of left female breast: Secondary | ICD-10-CM | POA: Insufficient documentation

## 2014-02-14 DIAGNOSIS — R29818 Other symptoms and signs involving the nervous system: Secondary | ICD-10-CM

## 2014-02-14 LAB — CBC WITH DIFFERENTIAL/PLATELET
BASOS ABS: 0.1 10*3/uL (ref 0.0–0.1)
BASOS PCT: 3 % — AB (ref 0–1)
EOS ABS: 0.2 10*3/uL (ref 0.0–0.7)
Eosinophils Relative: 8 % — ABNORMAL HIGH (ref 0–5)
HCT: 33.5 % — ABNORMAL LOW (ref 36.0–46.0)
HEMOGLOBIN: 11 g/dL — AB (ref 12.0–15.0)
Lymphocytes Relative: 33 % (ref 12–46)
Lymphs Abs: 1 10*3/uL (ref 0.7–4.0)
MCH: 36.7 pg — AB (ref 26.0–34.0)
MCHC: 32.8 g/dL (ref 30.0–36.0)
MCV: 111.7 fL — ABNORMAL HIGH (ref 78.0–100.0)
MONOS PCT: 12 % (ref 3–12)
Monocytes Absolute: 0.4 10*3/uL (ref 0.1–1.0)
Neutro Abs: 1.3 10*3/uL — ABNORMAL LOW (ref 1.7–7.7)
Neutrophils Relative %: 44 % (ref 43–77)
Platelets: 297 10*3/uL (ref 150–400)
RBC: 3 MIL/uL — ABNORMAL LOW (ref 3.87–5.11)
RDW: 18.3 % — ABNORMAL HIGH (ref 11.5–15.5)
WBC: 2.9 10*3/uL — ABNORMAL LOW (ref 4.0–10.5)

## 2014-02-14 LAB — COMPREHENSIVE METABOLIC PANEL
ALBUMIN: 3.1 g/dL — AB (ref 3.5–5.2)
ALK PHOS: 135 U/L — AB (ref 39–117)
ALT: 39 U/L — ABNORMAL HIGH (ref 0–35)
AST: 120 U/L — AB (ref 0–37)
Anion gap: 15 (ref 5–15)
BUN: 4 mg/dL — AB (ref 6–23)
CO2: 21 mEq/L (ref 19–32)
CREATININE: 0.6 mg/dL (ref 0.50–1.10)
Calcium: 7.2 mg/dL — ABNORMAL LOW (ref 8.4–10.5)
Chloride: 99 mEq/L (ref 96–112)
GFR calc Af Amer: >90 mL/min (ref >90)
GFR calc non Af Amer: >90 mL/min (ref >90)
Glucose, Bld: 93 mg/dL (ref 70–99)
POTASSIUM: 5 meq/L (ref 3.7–5.3)
Sodium: 135 mEq/L — ABNORMAL LOW (ref 137–147)
TOTAL PROTEIN: 7.2 g/dL (ref 6.0–8.3)
Total Bilirubin: 0.7 mg/dL (ref 0.3–1.2)

## 2014-02-14 MED ORDER — AMLODIPINE BESYLATE 5 MG PO TABS: 5.0000 mg | ORAL_TABLET | Freq: Every day | ORAL | Status: DC

## 2014-02-14 MED ORDER — OXYCODONE HCL 10 MG PO TABS: ORAL_TABLET | ORAL | Status: DC

## 2014-02-14 NOTE — Progress Notes (Signed)
Labs for [id number] 

## 2014-02-14 NOTE — Assessment & Plan Note (Signed)
Infiltrating ductal carcinoma the left breast, grade 3, stage IIIA (downstaged from probable stage IIIB with what sounds like a T4B cancer at presentation). Her cancer was going through the skin she states. She was given Taxotere x3 cycles preoperatively in a dose dense fashion followed by 2 cycles of dose dense FEC. Her chemotherapy was interrupted because of the severe MRSA infection prompting discontinuation. At the time of definitive surgery she still had a 4.5 cm cancer with LV I and 7 of 8 positive lymph nodes. ER receptors 61% positive, PR receptors 100% positive, HER-2/neu nonamplified, Ki-67 are high at 75%.

## 2014-02-14 NOTE — Assessment & Plan Note (Signed)
New since last appointment.  She cannot tell me when this started, but it has been weeks.  Strength exam is positive for weakness on the left lower extremity with normal strength in upper extremities.  She admits to saying incorrect words.  She is claustrophobic and declines MRI scan today, but agreeable to CT of head imaging.

## 2014-02-14 NOTE — Assessment & Plan Note (Signed)
Will hold Xgeva today for a correct calcium level of 7.9.  She is encouraged to continue with Ca++ and Vit D at home as prescribed.

## 2014-02-14 NOTE — Patient Instructions (Signed)
[facility] Discharge Instructions  RECOMMENDATIONS MADE BY THE CONSULTANT AND ANY TEST RESULTS WILL BE SENT TO YOUR REFERRING PHYSICIAN.  Decrease Norvasc to 1 tablet daily (compared to two tablets daily). Xgeva injection today for your bone strength. Due to your left sided weakness and numbness, it is important to perform a CT of head to make sure that looks ok.  We will call you with results.  Continue chemotherapy pills as prescribed.  CTs of chest, abdomen, and pelvis in 4 weeks to evaluate response to therapy thus far. Cancer marker will be reported to [place] tomorrow and we will let you know the result tomorrow.   Merry Christmas  CBC    Component Value Date/Time   WBC 2.9* 02/14/2014 1331   WBC 7.1 01/24/2012 1539   RBC 3.00* 02/14/2014 1331   RBC 3.00* 10/27/2013 0800   RBC 4.39 01/24/2012 1539   HGB 11.0* 02/14/2014 1331   HGB 14.7 01/24/2012 1539   HCT 33.5* 02/14/2014 1331   HCT 43.7 01/24/2012 1539   PLT 297 02/14/2014 1331   PLT 258 01/24/2012 1539   MCV 111.7* 02/14/2014 1331   MCV 99.5 01/24/2012 1539   MCH 36.7* 02/14/2014 1331   MCH 33.5 01/24/2012 1539   MCHC 32.8 02/14/2014 1331   MCHC 33.6 01/24/2012 1539   RDW 18.3* 02/14/2014 1331   RDW 14.1 01/24/2012 1539   LYMPHSABS 1.0 02/14/2014 1331   LYMPHSABS 1.4 01/24/2012 1539   MONOABS 0.4 02/14/2014 1331   MONOABS 0.6 01/24/2012 1539   EOSABS 0.2 02/14/2014 1331   EOSABS 0.3 01/24/2012 1539   BASOSABS 0.1 02/14/2014 1331   BASOSABS 0.0 01/24/2012 1539      Chemistry      Component Value Date/Time   NA 135* 01/14/2014 1602   K 4.0 01/14/2014 1602   CL 97 01/14/2014 1602   CO2 22 01/14/2014 1602   BUN 6 01/14/2014 1602   CREATININE 0.73 01/14/2014 1602      Component Value Date/Time   CALCIUM 9.1 01/14/2014 1602   ALKPHOS 157* 01/14/2014 1602   AST 112* 01/14/2014 1602   ALT 36* 01/14/2014 1602   BILITOT 0.9 01/14/2014 1602       Thank you for choosing [facility] to provide your oncology and hematology care.  To afford each patient quality time with our providers, please arrive at least 15 minutes before your scheduled appointment time.  With your help, our goal is to use those 15 minutes to complete the necessary work-up to ensure our physicians have the information they need to help with your evaluation and healthcare recommendations.    Effective January 1st, 2014, we ask that you re-schedule your appointment with our physicians should you arrive 10 or more minutes late for your appointment.  We strive to give you quality time with our providers, and arriving late affects you and other patients whose appointments are after yours.    Again, thank you for choosing [facility].  Our hope is that these requests will decrease the amount of time that you wait before being seen by our physicians.       _____________________________________________________________  Should you have questions after your visit to [facility], please contact our office at [phone] between the hours of 8:30 a.m. and 5:00 p.m.  Voicemails left after 4:30 p.m. will not be returned until the following business day.  For prescription refill requests, have your pharmacy contact our office  with your prescription refill request.

## 2014-02-14 NOTE — Assessment & Plan Note (Signed)
Will decrease Norvasc to 5 mg ONCE daily (compared to BID).

## 2014-02-15 LAB — CANCER ANTIGEN 27.29: CA 27.29: 55 U/mL — AB (ref 0–39)

## 2014-02-16 ENCOUNTER — Encounter (HOSPITAL_COMMUNITY): Payer: Self-pay

## 2014-02-16 ENCOUNTER — Ambulatory Visit (HOSPITAL_COMMUNITY)
Admission: RE | Admit: 2014-02-16 | Discharge: 2014-02-16 | Disposition: A | Discharge status: Home / Self Care | Payer: Self-pay | Source: Ambulatory Visit | Attending: Oncology | Admitting: Oncology

## 2014-02-16 DIAGNOSIS — C50919 Malignant neoplasm of unspecified site of unspecified female breast: Secondary | ICD-10-CM | POA: Insufficient documentation

## 2014-02-16 DIAGNOSIS — C7951 Secondary malignant neoplasm of bone: Secondary | ICD-10-CM | POA: Insufficient documentation

## 2014-02-16 DIAGNOSIS — Z79899 Other long term (current) drug therapy: Secondary | ICD-10-CM | POA: Insufficient documentation

## 2014-02-16 MED ORDER — IOHEXOL 300 MG/ML  SOLN
75.0000 mL | Freq: Once | INTRAMUSCULAR | Status: AC | PRN
  Administered 2014-02-16: 75 mL via INTRAVENOUS

## 2014-02-22 NOTE — Telephone Encounter (Signed)
Husband notified regarding Ca 27 - 47 and Head CT results.  He will let Alyzah know.

## 2014-02-23 ENCOUNTER — Other Ambulatory Visit (HOSPITAL_COMMUNITY): Payer: Self-pay | Admitting: Oncology

## 2014-02-23 ENCOUNTER — Telehealth (HOSPITAL_COMMUNITY): Payer: Self-pay

## 2014-02-23 DIAGNOSIS — C50919 Malignant neoplasm of unspecified site of unspecified female breast: Secondary | ICD-10-CM

## 2014-02-23 DIAGNOSIS — C7951 Secondary malignant neoplasm of bone: Principal | ICD-10-CM

## 2014-02-23 MED ORDER — OXYCODONE HCL 10 MG PO TABS: ORAL_TABLET | ORAL | Status: DC

## 2014-02-23 NOTE — Telephone Encounter (Signed)
Call from [facility] requesting refill for Brittany Blair's oxycodone.  Has enough medication to last until Sunday.  [place] will be closed on Friday, Saturday and Sunday.  Would like to get prescription tomorrow afternoon if possible in order to get it filled tomorrow before pharmacy is closed.

## 2014-02-23 NOTE — Telephone Encounter (Signed)
Ready for pick up

## 2014-02-24 ENCOUNTER — Telehealth (HOSPITAL_COMMUNITY): Payer: Self-pay

## 2014-02-24 NOTE — Telephone Encounter (Signed)
Notified that Brittany Blair's prescription is ready and it needs to be picked up by 12 noon due to early closing of department.

## 2014-03-01 ENCOUNTER — Other Ambulatory Visit (HOSPITAL_COMMUNITY): Payer: Self-pay | Admitting: Oncology

## 2014-03-07 ENCOUNTER — Other Ambulatory Visit (HOSPITAL_COMMUNITY): Payer: Self-pay | Admitting: Oncology

## 2014-03-07 ENCOUNTER — Telehealth (HOSPITAL_COMMUNITY): Payer: Self-pay

## 2014-03-07 DIAGNOSIS — C7951 Secondary malignant neoplasm of bone: Principal | ICD-10-CM

## 2014-03-07 DIAGNOSIS — C50919 Malignant neoplasm of unspecified site of unspecified female breast: Secondary | ICD-10-CM

## 2014-03-07 MED ORDER — OXYCODONE HCL 10 MG PO TABS: ORAL_TABLET | ORAL | Status: DC

## 2014-03-07 NOTE — Telephone Encounter (Signed)
Refill requested for oxycodone, will pick up tomorrow afternoon.

## 2014-03-07 NOTE — Telephone Encounter (Signed)
Ready for pick up

## 2014-03-14 ENCOUNTER — Ambulatory Visit (HOSPITAL_COMMUNITY): Payer: Self-pay

## 2014-03-14 ENCOUNTER — Other Ambulatory Visit (HOSPITAL_COMMUNITY): Payer: Self-pay

## 2014-03-15 ENCOUNTER — Telehealth (HOSPITAL_COMMUNITY): Payer: Self-pay | Admitting: Lab

## 2014-03-16 ENCOUNTER — Telehealth (HOSPITAL_COMMUNITY): Payer: Self-pay

## 2014-03-16 ENCOUNTER — Other Ambulatory Visit (HOSPITAL_COMMUNITY): Payer: Self-pay | Admitting: Oncology

## 2014-03-16 DIAGNOSIS — C50919 Malignant neoplasm of unspecified site of unspecified female breast: Secondary | ICD-10-CM

## 2014-03-16 DIAGNOSIS — C7951 Secondary malignant neoplasm of bone: Principal | ICD-10-CM

## 2014-03-16 MED ORDER — OXYCODONE HCL 10 MG PO TABS: ORAL_TABLET | ORAL | Status: DC

## 2014-03-16 NOTE — Telephone Encounter (Signed)
Call requesting refill for oxycodone.  Would like to pick-up tomorrow.

## 2014-03-17 ENCOUNTER — Ambulatory Visit (HOSPITAL_COMMUNITY): Payer: Self-pay | Admitting: Hematology & Oncology

## 2014-03-21 ENCOUNTER — Encounter (HOSPITAL_COMMUNITY): Payer: Self-pay

## 2014-03-21 ENCOUNTER — Ambulatory Visit (HOSPITAL_COMMUNITY)
Admission: RE | Admit: 2014-03-21 | Discharge: 2014-03-21 | Disposition: A | Discharge status: Home / Self Care | Payer: Self-pay | Source: Ambulatory Visit | Attending: Oncology | Admitting: Oncology

## 2014-03-21 DIAGNOSIS — K760 Fatty (change of) liver, not elsewhere classified: Secondary | ICD-10-CM | POA: Insufficient documentation

## 2014-03-21 DIAGNOSIS — C7951 Secondary malignant neoplasm of bone: Secondary | ICD-10-CM | POA: Insufficient documentation

## 2014-03-21 DIAGNOSIS — Z08 Encounter for follow-up examination after completed treatment for malignant neoplasm: Secondary | ICD-10-CM | POA: Insufficient documentation

## 2014-03-21 DIAGNOSIS — C50919 Malignant neoplasm of unspecified site of unspecified female breast: Secondary | ICD-10-CM | POA: Insufficient documentation

## 2014-03-21 MED ORDER — IOHEXOL 300 MG/ML  SOLN
100.0000 mL | Freq: Once | INTRAMUSCULAR | Status: AC | PRN
  Administered 2014-03-21: 100 mL via INTRAVENOUS

## 2014-03-21 NOTE — Progress Notes (Signed)
Patient's husband notified  

## 2014-03-23 ENCOUNTER — Encounter (HOSPITAL_COMMUNITY): Payer: Self-pay | Admitting: Hematology & Oncology

## 2014-03-23 ENCOUNTER — Encounter (HOSPITAL_COMMUNITY): Payer: Self-pay | Attending: Oncology | Admitting: Hematology & Oncology

## 2014-03-23 ENCOUNTER — Other Ambulatory Visit (HOSPITAL_COMMUNITY): Payer: Self-pay | Admitting: Oncology

## 2014-03-23 VITALS — BP 95/64 | HR 104 | Temp 99.0°F | Resp 16 | Wt 185.6 lb

## 2014-03-23 DIAGNOSIS — F411 Generalized anxiety disorder: Secondary | ICD-10-CM | POA: Insufficient documentation

## 2014-03-23 DIAGNOSIS — C50319 Malignant neoplasm of lower-inner quadrant of unspecified female breast: Secondary | ICD-10-CM

## 2014-03-23 DIAGNOSIS — C7951 Secondary malignant neoplasm of bone: Secondary | ICD-10-CM | POA: Insufficient documentation

## 2014-03-23 DIAGNOSIS — R978 Other abnormal tumor markers: Secondary | ICD-10-CM | POA: Insufficient documentation

## 2014-03-23 DIAGNOSIS — C50919 Malignant neoplasm of unspecified site of unspecified female breast: Secondary | ICD-10-CM

## 2014-03-23 DIAGNOSIS — C50312 Malignant neoplasm of lower-inner quadrant of left female breast: Secondary | ICD-10-CM | POA: Insufficient documentation

## 2014-03-23 DIAGNOSIS — G893 Neoplasm related pain (acute) (chronic): Secondary | ICD-10-CM

## 2014-03-23 DIAGNOSIS — R938 Abnormal findings on diagnostic imaging of other specified body structures: Secondary | ICD-10-CM | POA: Insufficient documentation

## 2014-03-23 MED ORDER — PREGABALIN 75 MG PO CAPS: ORAL_CAPSULE | ORAL | Status: DC

## 2014-03-23 MED ORDER — OXYCODONE HCL ER 40 MG PO T12A: 40.0000 mg | EXTENDED_RELEASE_TABLET | Freq: Two times a day (BID) | ORAL | Status: DC

## 2014-03-23 MED ORDER — OXYCODONE HCL 10 MG PO TABS: ORAL_TABLET | ORAL | Status: DC

## 2014-03-23 NOTE — Progress Notes (Signed)
No PCP Per Patient No address on file    DIAGNOSIS: Stage IV carcinoma of the breast  SUMMARY OF ONCOLOGIC HISTORY:   Carcinoma of breast metastatic to bone   12/08/2013 Progression Iliac bone biopsy consistent with adenocarcinoma, ER/PR positive, HER-2/neu not overexpressed   12/23/2013 -  Chemotherapy Ibrance 100 mg daily for 3 weeks out of every 4 and letrozole 2.5 mg daily.   01/17/2014 Miscellaneous Xgeva 120 mg SQ every 28 days for bone metastases.    CURRENT THERAPY: Ibrance/letrozole and XGEVA  INTERVAL HISTORY: Brittany Blair 54 y.o. female returns for follow-up of her breast cancer.  She just had CT scans and is also here to review them.  She complains of new pain, mostly involving the left leg.She also has chronic lymphedema and neuropathic pain.   MEDICAL HISTORY: Past Medical History  Diagnosis Date  . Anxiety   . Herpes   . MRSA (methicillin resistant Staphylococcus aureus)   . Breast lump     left  . Hypertension   . Hematoma   . Swelling     left foot  . Multiple blisters     along surgical site  . Depression   . Genital herpes 08/04/2012  . PMB (postmenopausal bleeding) 06/23/2013  . Lymph edema   . Vaginal delivery 1981, 1984, 1987, 1992  . Breast cancer dx'd 05/27/2010    chemo/xrt comp 01/2011  . Carcinoma of breast metastatic to bone 05/28/2010    This patient presented in April with an invasive ductal carcinoma that had broken through the skin and was contaminated with MRSA. She was stage III. It was lower inner quadrant. It was receptor positive, HER-2/neu negative, and had a KI-67 of 75%.  She was treated with neoadjuvant chemotherapy. She was recently hospitalized (June, 2012,) with multiple superficial MRSA infections. These have all r    has Carcinoma of breast metastatic to bone; Syncope; GAD (generalized anxiety disorder); HTN (hypertension), benign; Chronic chest pain; Depression; Transaminitis; Genital herpes; PMB (postmenopausal  bleeding); Acute blood loss anemia; Abdominal pain; AKI (acute kidney injury); GIB (gastrointestinal bleeding); Syncope and collapse; Prepyloric ulcer; Left sided numbness; Hypotension; Hypocalcemia; Infiltrating ductal carcinoma of left breast; and Cancer related pain on her problem list.     is allergic to bee venom.  Brittany Blair had no medications administered during this visit.  SURGICAL HISTORY: Past Surgical History  Procedure Laterality Date  . Tubal ligation    . Wisdom tooth extraction    . Mastectomy modified radical  10/10/10     left -Dr Margot Chimes  . Laparoscopic assisted vaginal hysterectomy Bilateral 10/14/2013    Procedure: LAPAROSCOPIC ASSISTED VAGINAL HYSTERECTOMY;  Surgeon: Ena Dawley, MD;  Location: [facility];  Service: Gynecology;  Laterality: Bilateral;  . Cystoscopy N/A 10/14/2013    Procedure: CYSTOSCOPY;  Surgeon: Ena Dawley, MD;  Location: [facility];  Service: Gynecology;  Laterality: N/A;  . Laparoscopic lysis of adhesions N/A 10/14/2013    Procedure: LAPAROSCOPIC LYSIS OF ADHESIONS;  Surgeon: Ena Dawley, MD;  Location: [facility];  Service: Gynecology;  Laterality: N/A;  . Laparoscopic bilateral salpingo oopherectomy Bilateral 10/14/2013    Procedure: LAPAROSCOPIC BILATERAL SALPINGO OOPHORECTOMY;  Surgeon: Ena Dawley, MD;  Location: [facility];  Service: Gynecology;  Laterality: Bilateral;  . Esophagogastroduodenoscopy N/A 10/27/2013    Procedure: ESOPHAGOGASTRODUODENOSCOPY (EGD);  Surgeon: Wonda Horner, MD;  Location: [facility];  Service: Endoscopy;  Laterality: N/A;  . Bone biopsy Left 11/2013    left post. hip  SOCIAL HISTORY: History   Social History  . Marital Status: Married    Spouse Name: N/A    Number of Children: N/A  . Years of Education: N/A   Occupational History  . Not on file.   Social History Main Topics  . Smoking status: Current Every Day Smoker -- 0.35 packs/day    Types: Cigarettes    Last Attempt to Quit: 09/26/2013  .  Smokeless tobacco: Never Used  . Alcohol Use: 12.6 oz/week    21 Glasses of wine per week  . Drug Use: No  . Sexual Activity: Not Currently    Birth Control/ Protection: None   Other Topics Concern  . Not on file   Social History Narrative    FAMILY HISTORY: Family History  Problem Relation Age of Onset  . Heart disease Father     heart attack  . Hypertension Daughter   . Hypertension Son   . Cancer Maternal Grandmother     ovarian  . Hypertension Maternal Grandfather   . Cancer Paternal Grandmother     ovarian  . Hypertension Paternal Grandfather     Review of Systems  Constitutional: Positive for malaise/fatigue.  Eyes: Negative.   Respiratory: Negative.   Cardiovascular: Negative.   Gastrointestinal: Negative.   Genitourinary: Negative.   Musculoskeletal: Positive for myalgias, back pain, joint pain and neck pain.  Skin: Negative.   Neurological: Positive for tingling, sensory change and weakness.  Endo/Heme/Allergies: Negative.   Psychiatric/Behavioral: The patient is nervous/anxious.     PHYSICAL EXAMINATION  ECOG PERFORMANCE STATUS: 1 - Symptomatic but completely ambulatory  Filed Vitals:   03/23/14 1544  BP: 95/64  Pulse:   Temp:   Resp:     Physical Exam  Constitutional: She is oriented to person, place, and time and well-developed, well-nourished, and in no distress. No distress.  In a wheelchair, unable to get to exam table today secondary to pain/discomfort  HENT:  Head: Normocephalic and atraumatic.  Nose: Nose normal.  Mouth/Throat: Oropharynx is clear and moist. No oropharyngeal exudate.  Eyes: Conjunctivae and EOM are normal. Pupils are equal, round, and reactive to light. Right eye exhibits no discharge. Left eye exhibits no discharge. No scleral icterus.  Neck: Normal range of motion. Neck supple. No tracheal deviation present. No thyromegaly present.  Cardiovascular: Normal rate, regular rhythm and normal heart sounds.  Exam reveals no  gallop and no friction rub.   No murmur heard. Pulmonary/Chest: Effort normal and breath sounds normal. She has no wheezes. She has no rales.  Abdominal: Soft. Bowel sounds are normal. She exhibits no distension and no mass. There is no tenderness. There is no rebound and no guarding.  Exam limited, patient sitting upright in wheelchair  Musculoskeletal: Normal range of motion. She exhibits no edema.  Lymphadenopathy:    She has no cervical adenopathy.  Neurological: She is alert and oriented to person, place, and time. She has normal reflexes. No cranial nerve deficit. Coordination normal.  Skin: Skin is warm. No rash noted. She is not diaphoretic.  Psychiatric: Mood, memory, affect and judgment normal.  Nursing note and vitals reviewed.   LABORATORY DATA:  CBC    Component Value Date/Time   WBC 2.9* 02/14/2014 1331   WBC 7.1 01/24/2012 1539   RBC 3.00* 02/14/2014 1331   RBC 3.00* 10/27/2013 0800   RBC 4.39 01/24/2012 1539   HGB 11.0* 02/14/2014 1331   HGB 14.7 01/24/2012 1539   HCT 33.5* 02/14/2014 1331   HCT 43.7 01/24/2012  1539   PLT 297 02/14/2014 1331   PLT 258 01/24/2012 1539   MCV 111.7* 02/14/2014 1331   MCV 99.5 01/24/2012 1539   MCH 36.7* 02/14/2014 1331   MCH 33.5 01/24/2012 1539   MCHC 32.8 02/14/2014 1331   MCHC 33.6 01/24/2012 1539   RDW 18.3* 02/14/2014 1331   RDW 14.1 01/24/2012 1539   LYMPHSABS 1.0 02/14/2014 1331   LYMPHSABS 1.4 01/24/2012 1539   MONOABS 0.4 02/14/2014 1331   MONOABS 0.6 01/24/2012 1539   EOSABS 0.2 02/14/2014 1331   EOSABS 0.3 01/24/2012 1539   BASOSABS 0.1 02/14/2014 1331   BASOSABS 0.0 01/24/2012 1539   CMP     Component Value Date/Time   NA 135* 02/14/2014 1331   K 5.0 02/14/2014 1331   CL 99 02/14/2014 1331   CO2 21 02/14/2014 1331   GLUCOSE 93 02/14/2014 1331   BUN 4* 02/14/2014 1331   CREATININE 0.60 02/14/2014 1331   CALCIUM 7.2* 02/14/2014 1331   PROT 7.2 02/14/2014 1331   ALBUMIN 3.1* 02/14/2014 1331   AST 120*  02/14/2014 1331   ALT 39* 02/14/2014 1331   ALKPHOS 135* 02/14/2014 1331   BILITOT 0.7 02/14/2014 1331   GFRNONAA >90 02/14/2014 1331   GFRAA >90 02/14/2014 1331    03/21/2014 EXAM: CT CHEST, ABDOMEN, AND PELVIS WITH CONTRAST IMPRESSION: 1. Stable osseous metastatic disease. 2. No findings for pulmonary metastatic disease or recurrent chest wall disease. 3. Diffuse fatty infiltration of the liver but no evidence of hepatic metastatic disease. No abdominal/pelvic mass or adenopathy.   Electronically Signed  By: Kalman Jewels M.D.  On: 03/21/2014 14:26  ASSESSMENT and THERAPY PLAN:    Carcinoma of breast metastatic to bone 54 year old female with stage IV carcinoma of the breast currently on Ibrance and letrozole. She is also on Xgeva monthly. CT scans were reviewed with the patient which revealed her bony metastatic disease, but no evidence of visceral metastases. She has worsening bone pain mostly involving the left leg, and also worsening neuropathic pain. We discussed several options for management. She currently has inadequate pain control as she is only using short acting narcotics. She is up through the night in order to get her medications. Her husband states her pain is up and down constantly through the day.  I recommended changing her to a long-acting pain medication twice daily, using her short acting medication for breakthrough. I discussed the goals of pain management with her in detail. Given her worsening leg pain we have also discussed a bone scan to perhaps evaluate for new sites of disease. I will plan on seeing her back post imaging studies. She is to continue her current medication regimen.   Cancer related pain 54 year old female with stage IV carcinoma of the breast with significant pain not well controlled on current short acting narcotics. We have changed her pain medication regimen to include both long acting and short acting medications. I described the  difference between the 2 types of medications to the patient and her husband. I advised them the ultimate goal is to get her on a long-acting pain medication with minimal use of breakthrough meds. We also discussed treating her neuropathic pain, we have added Lyrica. I have advised them to call with any difficulties tolerating any of the newly prescribed medications. I discussed constipation with narcotic use and provide them with a bowel regimen consisting of MiraLAX daily and Senokot S up to 4 tablets twice daily.     All questions were answered. The  patient knows to call the clinic with any problems, questions or concerns. We can certainly see the patient much sooner if necessary.  Molli Hazard 04/05/2014

## 2014-03-23 NOTE — Patient Instructions (Signed)
[place] at [facility]  Discharge Instructions: Please call with any problems or concerns in the interim We will see you back in one month to see how you are doing _______________________________________________________________  Thank you for choosing [place] at [facility] to provide your oncology and hematology care.  To afford each patient quality time with our providers, please arrive at least 15 minutes before your scheduled appointment.  You need to re-schedule your appointment if you arrive 10 or more minutes late.  We strive to give you quality time with our providers, and arriving late affects you and other patients whose appointments are after yours.  Also, if you no show three or more times for appointments you may be dismissed from the clinic.  Again, thank you for choosing [place] [facility] [place] hope is that these requests will allow you access to exceptional care and in a timely manner. _______________________________________________________________  If you have questions after your visit, please contact our office at [phone] between the hours of 8:30 a.m. and 5:00 p.m. Voicemails left after 4:30 p.m. will not be returned until the following business day. _______________________________________________________________  For prescription refill requests, have your pharmacy contact our office. _______________________________________________________________  Recommendations made by the consultant and any test results will be sent to your referring physician. _______________________________________________________________

## 2014-03-25 ENCOUNTER — Encounter (HOSPITAL_COMMUNITY): Payer: Self-pay

## 2014-03-31 ENCOUNTER — Telehealth (HOSPITAL_COMMUNITY): Payer: Self-pay

## 2014-03-31 ENCOUNTER — Other Ambulatory Visit (HOSPITAL_COMMUNITY): Payer: Self-pay | Admitting: Oncology

## 2014-03-31 DIAGNOSIS — F411 Generalized anxiety disorder: Secondary | ICD-10-CM

## 2014-03-31 MED ORDER — ALPRAZOLAM 1 MG PO TABS: 1.0000 mg | ORAL_TABLET | Freq: Three times a day (TID) | ORAL | Status: DC

## 2014-03-31 NOTE — Telephone Encounter (Signed)
Call from husband requesting refill for Xanax.  Will pick up rx tomorrow.

## 2014-04-01 ENCOUNTER — Ambulatory Visit (HOSPITAL_COMMUNITY)
Admission: RE | Admit: 2014-04-01 | Discharge: 2014-04-01 | Disposition: A | Discharge status: Home / Self Care | Payer: Self-pay | Source: Ambulatory Visit | Attending: Hematology & Oncology | Admitting: Hematology & Oncology

## 2014-04-01 ENCOUNTER — Encounter (HOSPITAL_COMMUNITY): Payer: Self-pay

## 2014-04-01 DIAGNOSIS — C7951 Secondary malignant neoplasm of bone: Secondary | ICD-10-CM | POA: Insufficient documentation

## 2014-04-01 DIAGNOSIS — C50919 Malignant neoplasm of unspecified site of unspecified female breast: Secondary | ICD-10-CM | POA: Insufficient documentation

## 2014-04-01 MED ORDER — TECHNETIUM TC 99M MEDRONATE IV KIT: 25.0000 | PACK | Freq: Once | INTRAVENOUS | Status: AC | PRN

## 2014-04-05 DIAGNOSIS — G893 Neoplasm related pain (acute) (chronic): Secondary | ICD-10-CM | POA: Insufficient documentation

## 2014-04-05 NOTE — Assessment & Plan Note (Signed)
54 year old female with stage IV carcinoma of the breast with significant pain not well controlled on current short acting narcotics. We have changed her pain medication regimen to include both long acting and short acting medications. I described the difference between the 2 types of medications to the patient and her husband. I advised them the ultimate goal is to get her on a long-acting pain medication with minimal use of breakthrough meds. We also discussed treating her neuropathic pain, we have added Lyrica. I have advised them to call with any difficulties tolerating any of the newly prescribed medications. I discussed constipation with narcotic use and provide them with a bowel regimen consisting of MiraLAX daily and Senokot S up to 4 tablets twice daily.

## 2014-04-05 NOTE — Assessment & Plan Note (Signed)
54 year old female with stage IV carcinoma of the breast currently on Ibrance and letrozole. She is also on Xgeva monthly. CT scans were reviewed with the patient which revealed her bony metastatic disease, but no evidence of visceral metastases. She has worsening bone pain mostly involving the left leg, and also worsening neuropathic pain. We discussed several options for management. She currently has inadequate pain control as she is only using short acting narcotics. She is up through the night in order to get her medications. Her husband states her pain is up and down constantly through the day.  I recommended changing her to a long-acting pain medication twice daily, using her short acting medication for breakthrough. I discussed the goals of pain management with her in detail. Given her worsening leg pain we have also discussed a bone scan to perhaps evaluate for new sites of disease. I will plan on seeing her back post imaging studies. She is to continue her current medication regimen.

## 2014-04-07 ENCOUNTER — Other Ambulatory Visit (HOSPITAL_COMMUNITY): Payer: Self-pay | Admitting: Hematology & Oncology

## 2014-04-07 ENCOUNTER — Encounter (HOSPITAL_COMMUNITY): Payer: Self-pay

## 2014-04-07 DIAGNOSIS — C7951 Secondary malignant neoplasm of bone: Principal | ICD-10-CM

## 2014-04-07 DIAGNOSIS — C50919 Malignant neoplasm of unspecified site of unspecified female breast: Secondary | ICD-10-CM

## 2014-04-07 MED ORDER — OXYCODONE HCL 10 MG PO TABS: ORAL_TABLET | ORAL | Status: DC

## 2014-04-07 NOTE — Progress Notes (Unsigned)
Husband by to pick up oxycodone.  Per husband she is taking less since she started the longer acting Oxycontin.  Previously he picked up prescriptions about every 8 - 10 days.  Has been 14 days this time.  States that when the weather is cold her pain is worse and she generally will take more pain medication.

## 2014-04-07 NOTE — Telephone Encounter (Signed)
Requests refill for Oxycodone 10 mg for Brittany Blair. Will run out by end of today.  Will pick-up today after work.

## 2014-04-15 ENCOUNTER — Other Ambulatory Visit (HOSPITAL_COMMUNITY): Payer: Self-pay | Admitting: *Deleted

## 2014-04-15 ENCOUNTER — Other Ambulatory Visit (HOSPITAL_COMMUNITY): Payer: Self-pay | Admitting: Hematology and Oncology

## 2014-04-15 DIAGNOSIS — C50919 Malignant neoplasm of unspecified site of unspecified female breast: Secondary | ICD-10-CM

## 2014-04-15 DIAGNOSIS — C7951 Secondary malignant neoplasm of bone: Principal | ICD-10-CM

## 2014-04-15 MED ORDER — TRAZODONE HCL 50 MG PO TABS: 50.0000 mg | ORAL_TABLET | Freq: Every day | ORAL | Status: DC

## 2014-04-22 ENCOUNTER — Ambulatory Visit (HOSPITAL_COMMUNITY): Payer: Self-pay

## 2014-04-22 ENCOUNTER — Ambulatory Visit (HOSPITAL_COMMUNITY): Payer: Self-pay | Admitting: Hematology & Oncology

## 2014-04-22 ENCOUNTER — Other Ambulatory Visit (HOSPITAL_COMMUNITY): Payer: Self-pay

## 2014-04-22 ENCOUNTER — Other Ambulatory Visit (HOSPITAL_COMMUNITY): Payer: Self-pay | Admitting: Hematology & Oncology

## 2014-04-22 DIAGNOSIS — C7951 Secondary malignant neoplasm of bone: Secondary | ICD-10-CM

## 2014-04-22 DIAGNOSIS — C50919 Malignant neoplasm of unspecified site of unspecified female breast: Secondary | ICD-10-CM

## 2014-04-22 MED ORDER — ACYCLOVIR 400 MG PO TABS
400.0000 mg | ORAL_TABLET | Freq: Every day | ORAL | Status: DC
Start: 2014-04-22 — End: 2014-04-29

## 2014-04-22 MED ORDER — OXYCODONE HCL ER 40 MG PO T12A: 40.0000 mg | EXTENDED_RELEASE_TABLET | Freq: Two times a day (BID) | ORAL | Status: DC

## 2014-04-22 NOTE — Telephone Encounter (Signed)
Call from husband.  Brittany Blair is sick and appointment has been rescheduled for 03/25/14.  Needs refills for acyclovir and Oxycodone 40 mg bid.  Would like to pick up around 3 pm.

## 2014-04-25 ENCOUNTER — Ambulatory Visit (HOSPITAL_COMMUNITY): Payer: Self-pay | Admitting: Hematology & Oncology

## 2014-04-25 ENCOUNTER — Ambulatory Visit (HOSPITAL_COMMUNITY): Payer: Self-pay

## 2014-04-25 ENCOUNTER — Other Ambulatory Visit (HOSPITAL_COMMUNITY): Payer: Self-pay

## 2014-04-28 ENCOUNTER — Other Ambulatory Visit (HOSPITAL_COMMUNITY): Payer: Self-pay | Admitting: Oncology

## 2014-04-28 DIAGNOSIS — C7951 Secondary malignant neoplasm of bone: Principal | ICD-10-CM

## 2014-04-28 DIAGNOSIS — C50919 Malignant neoplasm of unspecified site of unspecified female breast: Secondary | ICD-10-CM

## 2014-04-28 MED ORDER — OXYCODONE HCL 10 MG PO TABS: ORAL_TABLET | ORAL | Status: DC

## 2014-04-29 ENCOUNTER — Encounter (HOSPITAL_COMMUNITY): Payer: Self-pay | Attending: Oncology

## 2014-04-29 ENCOUNTER — Encounter (HOSPITAL_COMMUNITY): Payer: Self-pay | Admitting: Oncology

## 2014-04-29 ENCOUNTER — Encounter (HOSPITAL_COMMUNITY): Payer: Self-pay | Attending: Oncology | Admitting: Oncology

## 2014-04-29 ENCOUNTER — Encounter (HOSPITAL_COMMUNITY): Payer: Self-pay

## 2014-04-29 VITALS — BP 97/69 | HR 96 | Temp 98.4°F | Resp 18 | Wt 179.5 lb

## 2014-04-29 DIAGNOSIS — R938 Abnormal findings on diagnostic imaging of other specified body structures: Secondary | ICD-10-CM | POA: Insufficient documentation

## 2014-04-29 DIAGNOSIS — A6000 Herpesviral infection of urogenital system, unspecified: Secondary | ICD-10-CM | POA: Insufficient documentation

## 2014-04-29 DIAGNOSIS — F411 Generalized anxiety disorder: Secondary | ICD-10-CM | POA: Insufficient documentation

## 2014-04-29 DIAGNOSIS — C7951 Secondary malignant neoplasm of bone: Secondary | ICD-10-CM | POA: Insufficient documentation

## 2014-04-29 DIAGNOSIS — C50919 Malignant neoplasm of unspecified site of unspecified female breast: Secondary | ICD-10-CM

## 2014-04-29 DIAGNOSIS — C50312 Malignant neoplasm of lower-inner quadrant of left female breast: Secondary | ICD-10-CM

## 2014-04-29 DIAGNOSIS — G893 Neoplasm related pain (acute) (chronic): Secondary | ICD-10-CM | POA: Insufficient documentation

## 2014-04-29 DIAGNOSIS — R978 Other abnormal tumor markers: Secondary | ICD-10-CM | POA: Insufficient documentation

## 2014-04-29 LAB — COMPREHENSIVE METABOLIC PANEL
ALT: 31 U/L (ref 0–35)
AST: 97 U/L — AB (ref 0–37)
Albumin: 3.6 g/dL (ref 3.5–5.2)
Alkaline Phosphatase: 102 U/L (ref 39–117)
Anion gap: 11 (ref 5–15)
BILIRUBIN TOTAL: 1 mg/dL (ref 0.3–1.2)
BUN: <5 mg/dL — ABNORMAL LOW (ref 6–23)
CALCIUM: 5.7 mg/dL — AB (ref 8.4–10.5)
CO2: 21 mmol/L (ref 19–32)
CREATININE: 0.6 mg/dL (ref 0.50–1.10)
Chloride: 102 mmol/L (ref 96–112)
GLUCOSE: 109 mg/dL — AB (ref 70–99)
Potassium: 4.1 mmol/L (ref 3.5–5.1)
Sodium: 134 mmol/L — ABNORMAL LOW (ref 135–145)
Total Protein: 7.5 g/dL (ref 6.0–8.3)

## 2014-04-29 LAB — CBC WITH DIFFERENTIAL/PLATELET
Basophils Absolute: 0 10*3/uL (ref 0.0–0.1)
Basophils Relative: 1 % (ref 0–1)
Eosinophils Absolute: 0.2 10*3/uL (ref 0.0–0.7)
Eosinophils Relative: 4 % (ref 0–5)
HCT: 37.7 % (ref 36.0–46.0)
HEMOGLOBIN: 12.6 g/dL (ref 12.0–15.0)
LYMPHS PCT: 28 % (ref 12–46)
Lymphs Abs: 1.1 10*3/uL (ref 0.7–4.0)
MCH: 40.9 pg — ABNORMAL HIGH (ref 26.0–34.0)
MCHC: 33.4 g/dL (ref 30.0–36.0)
MCV: 122.4 fL — ABNORMAL HIGH (ref 78.0–100.0)
MONO ABS: 0.3 10*3/uL (ref 0.1–1.0)
MONOS PCT: 7 % (ref 3–12)
NEUTROS ABS: 2.3 10*3/uL (ref 1.7–7.7)
NEUTROS PCT: 60 % (ref 43–77)
Platelets: 329 10*3/uL (ref 150–400)
RBC: 3.08 MIL/uL — ABNORMAL LOW (ref 3.87–5.11)
RDW: 14.9 % (ref 11.5–15.5)
WBC: 3.8 10*3/uL — AB (ref 4.0–10.5)

## 2014-04-29 MED ORDER — ACYCLOVIR 400 MG PO TABS: 400.0000 mg | ORAL_TABLET | Freq: Two times a day (BID) | ORAL | Status: DC

## 2014-04-29 MED ORDER — OXYCODONE HCL 10 MG PO TABS: ORAL_TABLET | ORAL | Status: DC

## 2014-04-29 MED ORDER — MORPHINE SULFATE ER 60 MG PO TBCR: 60.0000 mg | EXTENDED_RELEASE_TABLET | Freq: Three times a day (TID) | ORAL | Status: DC

## 2014-04-29 NOTE — Assessment & Plan Note (Signed)
Xgeva held today.  Patient is noncompliant with PO Calcium.  She is asymptomatic from hypocalcemia.  Compliance encouraged.

## 2014-04-29 NOTE — Progress Notes (Signed)
No PCP Per Patient No address on file  Carcinoma of breast metastatic to bone, unspecified laterality - Plan: CBC with Differential, Comprehensive metabolic panel, Cancer antigen 27.29, Cancer Antigen 15-3, morphine (MS CONTIN) 60 MG 12 hr tablet, Oxycodone HCl 10 MG TABS  Cancer related pain - Plan: morphine (MS CONTIN) 60 MG 12 hr tablet  Genital herpes - Plan: acyclovir (ZOVIRAX) 400 MG tablet  Breast cancer, unspecified laterality - Plan: Oxycodone HCl 10 MG TABS  Hypocalcemia  CURRENT THERAPY: Ibrance plus letrozole will be given with ibrance 100 mg daily for 3 weeks out of every 4 and letrozole 2.5 mg daily. Xgeva beginning on 01/17/2014.  INTERVAL HISTORY: Brittany Blair 54 y.o. female returns for followup of Metastatic breast cancer to bone after undergoing an iliac bone biopsy on 12/08/2013 demonstrating an ER/PR +, Her2 negative breast cancer. She was started on Ibrance plus letrozole will be given with ibrance 100 mg daily for 3 weeks out of every 4 and letrozole 2.5 mg daily.     Carcinoma of breast metastatic to bone   12/08/2013 Progression Iliac bone biopsy consistent with adenocarcinoma, ER/PR positive, HER-2/neu not overexpressed   12/23/2013 -  Chemotherapy Ibrance 100 mg daily for 3 weeks out of every 4 and letrozole 2.5 mg daily.   01/17/2014 Miscellaneous Xgeva 120 mg SQ every 28 days for bone metastases.     She is tolerating therapy well thus far.  She denies any side effects today.  I personally reviewed and went over laboratory results with the patient.  The results are noted within this dictation.  She is hypocalcemic, but she admits to not taking her calcium.  Therefore, she cannot get Xgeva today.  She is encouraged to be compliant with her calcium.  She is asymptomatic from hypocalcemia.   Her biggest compliant is pain control.  She is currently taking Oxycontin 40 mg BID.  She reports that this medication does not affect her in the sense of  side effects or increased drowsiness.  Her husband notes that she is using less oxycodone and he thinks her pain is better controlled, but the patient thinks it can be better.  She missed her last two appointments due to pain.  I have recommended increasing Oxycontin to 40 mg TID, but the husband notes the $230 cost of the medication.  Therefore, I explored more cost effective options.  I spoke with the pharmacist as [facility] and she helped me with costs of long acting pain medications. MS contin is the most cost effective with the increased number of pills at $300+.  I called [facility] and I was quoted a cost of $102 for 90 tablets of 60 mg of MS contin.  I shared this information with the patient.  She is interested in switching long-acting pain medication to MS contin given the cost savings.  If this is less effective, we can increase dose or go back to Oxycontin.    She otherwise denies any complaints.  Her issue is pain related and she wants to blame a lot of her pain on malignancy, but I think some of her pain is chronic and pre-dates her metastatic disease.   She is compliant with her pain medications and therefore, increasing the dose is appropriate.    She notes that her acyclovir dose was decreased to 400 mg daily.  She subsequently had an outbreak of genital herpes.  Will therefore, escribe a new Rx for 400 mg BID.  She has already increased her dose at home.  They are working on ascertaining disability.    Past Medical History  Diagnosis Date  . Anxiety   . Herpes   . MRSA (methicillin resistant Staphylococcus aureus)   . Breast lump     left  . Hypertension   . Hematoma   . Swelling     left foot  . Multiple blisters     along surgical site  . Depression   . Genital herpes 08/04/2012  . PMB (postmenopausal bleeding) 06/23/2013  . Lymph edema   . Vaginal delivery 1981, 1984, 1987, 1992  . Breast cancer dx'd 05/27/2010    chemo/xrt comp 01/2011  . Carcinoma of breast metastatic to  bone 05/28/2010    This patient presented in April with an invasive ductal carcinoma that had broken through the skin and was contaminated with MRSA. She was stage III. It was lower inner quadrant. It was receptor positive, HER-2/neu negative, and had a KI-67 of 75%.  She was treated with neoadjuvant chemotherapy. She was recently hospitalized (June, 2012,) with multiple superficial MRSA infections. These have all r    has Carcinoma of breast metastatic to bone; Syncope; GAD (generalized anxiety disorder); HTN (hypertension), benign; Chronic chest pain; Depression; Transaminitis; Genital herpes; PMB (postmenopausal bleeding); Acute blood loss anemia; Abdominal pain; AKI (acute kidney injury); GIB (gastrointestinal bleeding); Syncope and collapse; Prepyloric ulcer; Left sided numbness; Hypotension; Hypocalcemia; Infiltrating ductal carcinoma of left breast; and Cancer related pain on her problem list.     is allergic to bee venom.  Ms. Shorts had no medications administered during this visit.  Past Surgical History  Procedure Laterality Date  . Tubal ligation    . Wisdom tooth extraction    . Mastectomy modified radical  10/10/10     left -Dr Margot Chimes  . Laparoscopic assisted vaginal hysterectomy Bilateral 10/14/2013    Procedure: LAPAROSCOPIC ASSISTED VAGINAL HYSTERECTOMY;  Surgeon: Ena Dawley, MD;  Location: [facility];  Service: Gynecology;  Laterality: Bilateral;  . Cystoscopy N/A 10/14/2013    Procedure: CYSTOSCOPY;  Surgeon: Ena Dawley, MD;  Location: [facility];  Service: Gynecology;  Laterality: N/A;  . Laparoscopic lysis of adhesions N/A 10/14/2013    Procedure: LAPAROSCOPIC LYSIS OF ADHESIONS;  Surgeon: Ena Dawley, MD;  Location: [facility];  Service: Gynecology;  Laterality: N/A;  . Laparoscopic bilateral salpingo oopherectomy Bilateral 10/14/2013    Procedure: LAPAROSCOPIC BILATERAL SALPINGO OOPHORECTOMY;  Surgeon: Ena Dawley, MD;  Location: [facility];  Service: Gynecology;   Laterality: Bilateral;  . Esophagogastroduodenoscopy N/A 10/27/2013    Procedure: ESOPHAGOGASTRODUODENOSCOPY (EGD);  Surgeon: Wonda Horner, MD;  Location[facility];  Service: Endoscopy;  Laterality: N/A;  . Bone biopsy Left 11/2013    left post. hip    Denies any headaches, dizziness, double vision, fevers, chills, night sweats, nausea, vomiting, diarrhea, constipation, chest pain, heart palpitations, shortness of breath, blood in stool, black tarry stool, urinary pain, urinary burning, urinary frequency, hematuria.   PHYSICAL EXAMINATION  ECOG PERFORMANCE STATUS: 2 - Symptomatic, <50% confined to bed  Filed Vitals:   04/29/14 1529  BP: 97/69  Pulse: 96  Temp: 98.4 F (36.9 C)  Resp: 18    GENERAL:alert, no distress, well nourished, well developed, comfortable, cooperative and smiling SKIN: skin color, texture, turgor are normal, no rashes or significant lesions, hyperpigmentation on left upper back, secondary to radiation therapy. HEAD: Normocephalic, No masses, lesions, tenderness or abnormalities EYES: normal, PERRLA, EOMI, Conjunctiva are pink and non-injected EARS: External ears  normal OROPHARYNX:lips, buccal mucosa, and tongue normal and mucous membranes are moist  NECK: supple, no adenopathy, trachea midline LYMPH:  no palpable lymphadenopathy BREAST:not examined LUNGS: clear to auscultation  HEART: regular rate & rhythm, no murmurs and no gallops ABDOMEN:abdomen soft and normal bowel sounds BACK: Back symmetric, no curvature. EXTREMITIES:less then 2 second capillary refill, no joint deformities, effusion, or inflammation, no skin discoloration, no cyanosis, positive findings:  lymphedema of left UE.  NEURO: alert & oriented x 3 with fluent speech, no focal motor/sensory deficits, in wheelchair   LABORATORY DATA: CBC    Component Value Date/Time   WBC 3.8* 04/29/2014 1510   WBC 7.1 01/24/2012 1539   RBC 3.08* 04/29/2014 1510   RBC 3.00* 10/27/2013 0800   RBC  4.39 01/24/2012 1539   HGB 12.6 04/29/2014 1510   HGB 14.7 01/24/2012 1539   HCT 37.7 04/29/2014 1510   HCT 43.7 01/24/2012 1539   PLT 329 04/29/2014 1510   PLT 258 01/24/2012 1539   MCV 122.4* 04/29/2014 1510   MCV 99.5 01/24/2012 1539   MCH 40.9* 04/29/2014 1510   MCH 33.5 01/24/2012 1539   MCHC 33.4 04/29/2014 1510   MCHC 33.6 01/24/2012 1539   RDW 14.9 04/29/2014 1510   RDW 14.1 01/24/2012 1539   LYMPHSABS 1.1 04/29/2014 1510   LYMPHSABS 1.4 01/24/2012 1539   MONOABS 0.3 04/29/2014 1510   MONOABS 0.6 01/24/2012 1539   EOSABS 0.2 04/29/2014 1510   EOSABS 0.3 01/24/2012 1539   BASOSABS 0.0 04/29/2014 1510   BASOSABS 0.0 01/24/2012 1539      Chemistry      Component Value Date/Time   NA 134* 04/29/2014 1510   K 4.1 04/29/2014 1510   CL 102 04/29/2014 1510   CO2 21 04/29/2014 1510   BUN <5* 04/29/2014 1510   CREATININE 0.60 04/29/2014 1510      Component Value Date/Time   CALCIUM 5.7* 04/29/2014 1510   ALKPHOS 102 04/29/2014 1510   AST 97* 04/29/2014 1510   ALT 31 04/29/2014 1510   BILITOT 1.0 04/29/2014 1510      Lab Results  Component Value Date   LABCA2 55* 02/14/2014     RADIOGRAPHIC STUDIES:  Nm Bone Scan Whole Body  04/01/2014   CLINICAL DATA:  History of breast cancer.  EXAM: NUCLEAR MEDICINE WHOLE BODY BONE SCAN  TECHNIQUE: Whole body anterior and posterior images were obtained approximately 3 hours after intravenous injection of radiopharmaceutical.  RADIOPHARMACEUTICALS:  25.0 mCi Technetium-99 MDP  COMPARISON:  CT performed 03/21/2014  FINDINGS: There is abnormal uptake noted in the left iliac bone and left inferior pubic ramus. Increased activity noted within adjacent left lateral ribs, likely old fractures. Increased activity noted in the region of the left clavicular head. Increased activity in the posterior left eleventh rib. No additional suspicious osseous uptake.  IMPRESSION: Abnormal uptake in the left clavicular head, left posterior eleventh  rib, left iliac bone and inferior pubic ramus compatible with metastases as seen on CT.  Adjacent uptake in left lateral ribs likely is posttraumatic.   Electronically Signed   By: Rolm Baptise M.D.   On: 04/01/2014 15:15     ASSESSMENT AND PLAN:  Carcinoma of breast metastatic to bone ER/PR+, Her2 negative malignancy, on Letrozole and Ibrance 100 mg daily 3 weeks on and 1 week off every 4 weeks.  Tolerating well thus far.  On Xgeva therapy for bone metastases with Ca++ and Vit D to prevent hypocalcemia associated with Xgeva.  Labs today: CBC  diff, CMET, CA 27.29, and CA 15-3.  Continue treatment as planned.  Return in 1 month for follow-up.   Cancer related pain On long-acting (Oxycontin 40 mg BID) and short-acting pain (Oxycodone 10-20 mg q 4 h PRN) medication.  Needing improved control and therefore increasing long-acting medication is most appropriate.  Due to cost, will change to MS Contin 60 mg in AM, 30 mg in afternoon, and 60 mg in PM.  If not improved, may increase to 60 mg TID.  Will continue with Oxycodone 10-20 mg as needed for breakthrough pain management.  Rx provided for MS Contin.  In the interim, she is to increase her Oxycontin to 40 mg TID.  Rx for Oxycodone to be filled on 3/28.  Patient and husband will keep [place] in the loop on effectiveness.   Hypocalcemia Xgeva held today.  Patient is noncompliant with PO Calcium.  She is asymptomatic from hypocalcemia.  Compliance encouraged.   THERAPY PLAN:  Continue therapy as planned.  We will continue with pain management as planned.   All questions were answered. The patient knows to call the clinic with any problems, questions or concerns. We can certainly see the patient much sooner if necessary.  Patient and plan discussed with Dr. Ancil Linsey and she is in agreement with the aforementioned.   I spent 40 minutes counseling the patient face to face. The total time spent in the appointment was 55 minutes.  More than 50% of  the time spent with the patient was utilized for counseling and coordination of care.  This note is electronically signed by: [name] 04/29/2014 5:12 PM

## 2014-04-29 NOTE — Progress Notes (Signed)
Brittany Blair presented for [employer]. Labs per MD order drawn via Peripheral Line 25 gauge needle inserted in rt ac.  Good blood return present. Procedure without incident.  Needle removed intact. Patient tolerated procedure well.

## 2014-04-29 NOTE — Assessment & Plan Note (Signed)
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  Tolerating well thus far.  On Xgeva therapy for bone metastases with Ca++ and Vit D to prevent hypocalcemia associated with Xgeva.  Labs today: CBC diff, CMET, CA 27.29, and CA 15-3.  Continue treatment as planned.  Return in 1 month for follow-up.

## 2014-04-29 NOTE — Progress Notes (Signed)
See doctors encounter foe more information

## 2014-04-29 NOTE — Patient Instructions (Signed)
..  [place] at [facility] Discharge Instructions  RECOMMENDATIONS MADE BY THE CONSULTANT AND ANY TEST RESULTS WILL BE SENT TO YOUR REFERRING PHYSICIAN.  Calcium was very low today and xgeva can not be given It is extremely important that you take your Calcium/vitamin D-take 2 a day Increase the oxycontin to 40 mg 3 times a day until gone Then: MS contin 60 mg om AM                   1/2 tab in afternoon                   60 mg at night     Thank you for choosing [place] at [facility] to provide your oncology and hematology care.  To afford each patient quality time with our provider, please arrive at least 15 minutes before your scheduled appointment time.    You need to re-schedule your appointment should you arrive 10 or more minutes late.  We strive to give you quality time with our providers, and arriving late affects you and other patients whose appointments are after yours.  Also, if you no show three or more times for appointments you may be dismissed from the clinic at the providers discretion.     Again, thank you for choosing [facility].  Our hope is that these requests will decrease the amount of time that you wait before being seen by our physicians.       _____________________________________________________________  Should you have questions after your visit to [facility], please contact our office at [phone] between the hours of 8:30 a.m. and 4:30 p.m.  Voicemails left after 4:30 p.m. will not be returned until the following business day.  For prescription refill requests, have your pharmacy contact our office.

## 2014-04-29 NOTE — Progress Notes (Signed)
CRITICAL VALUE ALERT  Critical value received:  Calcium 5.7  Date of notification:  04/29/14   Time of notification:  1612  Critical value read back:Yes.    Nurse who received alert:  A. Ouida Sills, RN  MD notified:  Dr. Whitney Muse at [phone]

## 2014-04-29 NOTE — Assessment & Plan Note (Addendum)
On long-acting (Oxycontin 40 mg BID) and short-acting pain (Oxycodone 10-20 mg q 4 h PRN) medication.  Needing improved control and therefore increasing long-acting medication is most appropriate.  Due to cost, will change to MS Contin 60 mg in AM, 30 mg in afternoon, and 60 mg in PM.  If not improved, may increase to 60 mg TID.  Will continue with Oxycodone 10-20 mg as needed for breakthrough pain management.  Rx provided for MS Contin.  In the interim, she is to increase her Oxycontin to 40 mg TID.  Rx for Oxycodone to be filled on 3/28.  Patient and husband will keep [place] in the loop on effectiveness.

## 2014-04-30 LAB — CANCER ANTIGEN 15-3: CANCER ANTIGEN-BREAST 15-3: 40 U/mL — AB (ref <32)

## 2014-04-30 LAB — CANCER ANTIGEN 27.29: CA 27.29: 59.8 U/mL — ABNORMAL HIGH (ref 0.0–38.6)

## 2014-05-18 ENCOUNTER — Other Ambulatory Visit (HOSPITAL_COMMUNITY): Payer: Self-pay | Admitting: Oncology

## 2014-05-18 ENCOUNTER — Telehealth (HOSPITAL_COMMUNITY): Payer: Self-pay

## 2014-05-18 DIAGNOSIS — C7951 Secondary malignant neoplasm of bone: Principal | ICD-10-CM

## 2014-05-18 DIAGNOSIS — C50919 Malignant neoplasm of unspecified site of unspecified female breast: Secondary | ICD-10-CM

## 2014-05-18 MED ORDER — OXYCODONE HCL 10 MG PO TABS: ORAL_TABLET | ORAL | Status: DC

## 2014-05-18 NOTE — Telephone Encounter (Signed)
Done

## 2014-05-18 NOTE — Telephone Encounter (Signed)
Call from [facility], requesting new prescription for Oxycondone 10 mg for Brittany Blair.  Has prescription but cannot be filled until 3/28.  Will run out by Saturday or at the latest on Sunday.  Wants to bring prescription back and get a new one if possible.  Would like to pick up tomorrow.

## 2014-05-27 ENCOUNTER — Other Ambulatory Visit (HOSPITAL_COMMUNITY): Payer: Self-pay

## 2014-05-27 ENCOUNTER — Ambulatory Visit (HOSPITAL_COMMUNITY): Payer: Self-pay

## 2014-05-27 ENCOUNTER — Ambulatory Visit (HOSPITAL_COMMUNITY): Payer: Self-pay | Admitting: Oncology

## 2014-05-30 ENCOUNTER — Other Ambulatory Visit (HOSPITAL_COMMUNITY): Payer: Self-pay | Admitting: Oncology

## 2014-05-30 DIAGNOSIS — C7951 Secondary malignant neoplasm of bone: Principal | ICD-10-CM

## 2014-05-30 DIAGNOSIS — C50919 Malignant neoplasm of unspecified site of unspecified female breast: Secondary | ICD-10-CM

## 2014-05-30 MED ORDER — PALBOCICLIB 100 MG PO CAPS: ORAL_CAPSULE | ORAL | Status: DC

## 2014-06-01 ENCOUNTER — Ambulatory Visit (HOSPITAL_COMMUNITY): Payer: Self-pay | Admitting: Oncology

## 2014-06-01 ENCOUNTER — Other Ambulatory Visit (HOSPITAL_COMMUNITY): Payer: Self-pay

## 2014-06-01 ENCOUNTER — Ambulatory Visit (HOSPITAL_COMMUNITY): Payer: Self-pay

## 2014-06-01 NOTE — Progress Notes (Signed)
-  rescheduled due to husband having cataract surgery-  Brittany Blair

## 2014-06-10 ENCOUNTER — Encounter (HOSPITAL_COMMUNITY): Payer: Self-pay | Attending: Oncology | Admitting: Oncology

## 2014-06-10 ENCOUNTER — Encounter (HOSPITAL_COMMUNITY): Payer: Self-pay

## 2014-06-10 ENCOUNTER — Encounter (HOSPITAL_COMMUNITY): Payer: Self-pay | Admitting: Oncology

## 2014-06-10 VITALS — BP 95/64 | HR 87 | Temp 98.3°F | Resp 16 | Wt 183.9 lb

## 2014-06-10 DIAGNOSIS — G893 Neoplasm related pain (acute) (chronic): Secondary | ICD-10-CM

## 2014-06-10 DIAGNOSIS — F411 Generalized anxiety disorder: Secondary | ICD-10-CM | POA: Insufficient documentation

## 2014-06-10 DIAGNOSIS — C50919 Malignant neoplasm of unspecified site of unspecified female breast: Secondary | ICD-10-CM

## 2014-06-10 DIAGNOSIS — C7951 Secondary malignant neoplasm of bone: Secondary | ICD-10-CM

## 2014-06-10 DIAGNOSIS — R978 Other abnormal tumor markers: Secondary | ICD-10-CM | POA: Insufficient documentation

## 2014-06-10 DIAGNOSIS — C50312 Malignant neoplasm of lower-inner quadrant of left female breast: Secondary | ICD-10-CM

## 2014-06-10 DIAGNOSIS — R938 Abnormal findings on diagnostic imaging of other specified body structures: Secondary | ICD-10-CM | POA: Insufficient documentation

## 2014-06-10 LAB — CBC WITH DIFFERENTIAL/PLATELET
BASOS ABS: 0 10*3/uL (ref 0.0–0.1)
BASOS PCT: 0 % (ref 0–1)
EOS ABS: 0.1 10*3/uL (ref 0.0–0.7)
Eosinophils Relative: 4 % (ref 0–5)
HCT: 37 % (ref 36.0–46.0)
HEMOGLOBIN: 12.9 g/dL (ref 12.0–15.0)
LYMPHS ABS: 0.9 10*3/uL (ref 0.7–4.0)
Lymphocytes Relative: 24 % (ref 12–46)
MCH: 41.6 pg — AB (ref 26.0–34.0)
MCHC: 34.9 g/dL (ref 30.0–36.0)
MCV: 119.4 fL — ABNORMAL HIGH (ref 78.0–100.0)
MONOS PCT: 15 % — AB (ref 3–12)
Monocytes Absolute: 0.5 10*3/uL (ref 0.1–1.0)
Neutro Abs: 2.1 10*3/uL (ref 1.7–7.7)
Neutrophils Relative %: 58 % (ref 43–77)
PLATELETS: 221 10*3/uL (ref 150–400)
RBC: 3.1 MIL/uL — AB (ref 3.87–5.11)
RDW: 12.7 % (ref 11.5–15.5)
WBC: 3.6 10*3/uL — AB (ref 4.0–10.5)

## 2014-06-10 LAB — COMPREHENSIVE METABOLIC PANEL
ALT: 24 U/L (ref 0–35)
AST: 53 U/L — ABNORMAL HIGH (ref 0–37)
Albumin: 4 g/dL (ref 3.5–5.2)
Alkaline Phosphatase: 81 U/L (ref 39–117)
Anion gap: 10 (ref 5–15)
BUN: 9 mg/dL (ref 6–23)
CO2: 24 mmol/L (ref 19–32)
CREATININE: 0.69 mg/dL (ref 0.50–1.10)
Calcium: 9 mg/dL (ref 8.4–10.5)
Chloride: 99 mmol/L (ref 96–112)
GLUCOSE: 106 mg/dL — AB (ref 70–99)
Potassium: 4.4 mmol/L (ref 3.5–5.1)
Sodium: 133 mmol/L — ABNORMAL LOW (ref 135–145)
TOTAL PROTEIN: 7.5 g/dL (ref 6.0–8.3)
Total Bilirubin: 0.6 mg/dL (ref 0.3–1.2)

## 2014-06-10 MED ORDER — MORPHINE SULFATE ER 60 MG PO TBCR: 60.0000 mg | EXTENDED_RELEASE_TABLET | Freq: Three times a day (TID) | ORAL | Status: DC

## 2014-06-10 MED ORDER — DENOSUMAB 120 MG/1.7ML ~~LOC~~ SOLN
120.0000 mg | Freq: Once | SUBCUTANEOUS | Status: AC
  Administered 2014-06-10: 120 mg via SUBCUTANEOUS
  Filled 2014-06-10: qty 1.7

## 2014-06-10 MED ORDER — OXYCODONE HCL 10 MG PO TABS: ORAL_TABLET | ORAL | Status: DC

## 2014-06-10 NOTE — Patient Instructions (Signed)
[place] at [facility] Discharge Instructions  RECOMMENDATIONS MADE BY THE CONSULTANT AND ANY TEST RESULTS WILL BE SENT TO YOUR REFERRING PHYSICIAN.  Exam and discussion by Kirby Crigler, PA-C. Return in 4 weeks for lab work and to see Dr. Whitney Muse. Continue taking chemo pills. Prescription for pain medications given. Call for any new or worsening symptoms or any questions or concerns.  Thank you for choosing [place] at [facility] to provide your oncology and hematology care.  To afford each patient quality time with our provider, please arrive at least 15 minutes before your scheduled appointment time.    You need to re-schedule your appointment should you arrive 10 or more minutes late.  We strive to give you quality time with our providers, and arriving late affects you and other patients whose appointments are after yours.  Also, if you no show three or more times for appointments you may be dismissed from the clinic at the providers discretion.     Again, thank you for choosing [facility].  Our hope is that these requests will decrease the amount of time that you wait before being seen by our physicians.       _____________________________________________________________  Should you have questions after your visit to [facility], please contact our office at [phone] between the hours of 8:30 a.m. and 4:30 p.m.  Voicemails left after 4:30 p.m. will not be returned until the following business day.  For prescription refill requests, have your pharmacy contact our office.

## 2014-06-10 NOTE — Assessment & Plan Note (Signed)
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  Tolerating well thus far.  On Xgeva therapy for bone metastases with Ca++ and Vit D to prevent hypocalcemia associated with Xgeva.  Labs today: CBC diff, CMET, CA 27.29, and CA 15-3.  Continue treatment as planned.  Return in 1 month for follow-up. 

## 2014-06-10 NOTE — Addendum Note (Signed)
Addended by: Ermalene Searing on: 06/10/2014 04:48 PM   Modules accepted: Orders

## 2014-06-10 NOTE — Assessment & Plan Note (Addendum)
On long-acting (MS Contin every 8 hours) and short-acting pain (Oxycodone 10-20 mg q 4 h PRN) medication.  Patient reported improved pain control.   Rx provided for MS Contin.    Rx for Oxycodone.

## 2014-06-10 NOTE — Progress Notes (Signed)
No PCP Per Patient No address on file  Carcinoma of breast metastatic to bone, unspecified laterality - Plan: Oxycodone HCl 10 MG TABS, morphine (MS CONTIN) 60 MG 12 hr tablet, DISCONTINUED: Oxycodone HCl 10 MG TABS  Cancer related pain - Plan: morphine (MS CONTIN) 60 MG 12 hr tablet  Hypocalcemia  Breast cancer, unspecified laterality - Plan: Oxycodone HCl 10 MG TABS, DISCONTINUED: Oxycodone HCl 10 MG TABS  CURRENT THERAPY: Ibrance plus letrozole will be given with ibrance 100 mg daily for 3 weeks out of every 4 and letrozole 2.5 mg daily. Xgeva beginning on 01/17/2014.  INTERVAL HISTORY: Brittany Blair 54 y.o. female returns for followup of Metastatic breast cancer to bone after undergoing an iliac bone biopsy on 12/08/2013 demonstrating an ER/PR +, Her2 negative breast cancer. She was started on Ibrance plus letrozole will be given with ibrance 100 mg daily for 3 weeks out of every 4 and letrozole 2.5 mg daily.     Carcinoma of breast metastatic to bone   12/08/2013 Progression Iliac bone biopsy consistent with adenocarcinoma, ER/PR positive, HER-2/neu not overexpressed   12/23/2013 -  Chemotherapy Ibrance 100 mg daily for 3 weeks out of every 4 and letrozole 2.5 mg daily.   01/17/2014 Miscellaneous Xgeva 120 mg SQ every 28 days for bone metastases.    I personally reviewed and went over laboratory results with the patient.  The results are noted within this dictation.  Her pain control is much improved with current regimen with MS contin every 8 hours with Oxycodone for breakthrough.    She reports that she feels great.  Oncologically, she denies any complaints and ROS questioning is negative.   Past Medical History  Diagnosis Date  . Anxiety   . Herpes   . MRSA (methicillin resistant Staphylococcus aureus)   . Breast lump     left  . Hypertension   . Hematoma   . Swelling     left foot  . Multiple blisters     along surgical site  . Depression   .  Genital herpes 08/04/2012  . PMB (postmenopausal bleeding) 06/23/2013  . Lymph edema   . Vaginal delivery 1981, 1984, 1987, 1992  . Breast cancer dx'd 05/27/2010    chemo/xrt comp 01/2011  . Carcinoma of breast metastatic to bone 05/28/2010    This patient presented in April with an invasive ductal carcinoma that had broken through the skin and was contaminated with MRSA. She was stage III. It was lower inner quadrant. It was receptor positive, HER-2/neu negative, and had a KI-67 of 75%.  She was treated with neoadjuvant chemotherapy. She was recently hospitalized (June, 2012,) with multiple superficial MRSA infections. These have all r    has Carcinoma of breast metastatic to bone; Syncope; GAD (generalized anxiety disorder); HTN (hypertension), benign; Chronic chest pain; Depression; Transaminitis; Genital herpes; PMB (postmenopausal bleeding); Acute blood loss anemia; Abdominal pain; AKI (acute kidney injury); GIB (gastrointestinal bleeding); Syncope and collapse; Prepyloric ulcer; Left sided numbness; Hypotension; Hypocalcemia; Infiltrating ductal carcinoma of left breast; and Cancer related pain on her problem list.     is allergic to bee venom.  Brittany Blair had no medications administered during this visit.  Past Surgical History  Procedure Laterality Date  . Tubal ligation    . Wisdom tooth extraction    . Mastectomy modified radical  10/10/10     left -Dr Margot Chimes  . Laparoscopic assisted vaginal hysterectomy Bilateral 10/14/2013    Procedure:  LAPAROSCOPIC ASSISTED VAGINAL HYSTERECTOMY;  Surgeon: Ena Dawley, MD;  Location: [facility];  Service: Gynecology;  Laterality: Bilateral;  . Cystoscopy N/A 10/14/2013    Procedure: CYSTOSCOPY;  Surgeon: Ena Dawley, MD;  Location: [facility];  Service: Gynecology;  Laterality: N/A;  . Laparoscopic lysis of adhesions N/A 10/14/2013    Procedure: LAPAROSCOPIC LYSIS OF ADHESIONS;  Surgeon: Ena Dawley, MD;  Location: [facility];  Service: Gynecology;   Laterality: N/A;  . Laparoscopic bilateral salpingo oopherectomy Bilateral 10/14/2013    Procedure: LAPAROSCOPIC BILATERAL SALPINGO OOPHORECTOMY;  Surgeon: Ena Dawley, MD;  Location: [facility];  Service: Gynecology;  Laterality: Bilateral;  . Esophagogastroduodenoscopy N/A 10/27/2013    Procedure: ESOPHAGOGASTRODUODENOSCOPY (EGD);  Surgeon: Wonda Horner, MD;  Location: [facility];  Service: Endoscopy;  Laterality: N/A;  . Bone biopsy Left 11/2013    left post. hip    Denies any headaches, dizziness, double vision, fevers, chills, night sweats, nausea, vomiting, diarrhea, constipation, chest pain, heart palpitations, shortness of breath, blood in stool, black tarry stool, urinary pain, urinary burning, urinary frequency, hematuria.   PHYSICAL EXAMINATION  ECOG PERFORMANCE STATUS: 2 - Symptomatic, <50% confined to bed  Filed Vitals:   06/10/14 1529  BP: 95/64  Pulse: 87  Temp: 98.3 F (36.8 C)  Resp: 16    GENERAL:alert, no distress, well nourished, well developed, comfortable, cooperative, obese, smiling and accompanied by husband SKIN: skin color, texture, turgor are normal, no rashes or significant lesions HEAD: Normocephalic, No masses, lesions, tenderness or abnormalities EYES: normal, PERRLA, EOMI, Conjunctiva are pink and non-injected EARS: External ears normal OROPHARYNX:lips, buccal mucosa, and tongue normal and mucous membranes are moist  NECK: supple, no adenopathy, thyroid normal size, non-tender, without nodularity, no stridor, non-tender, trachea midline LYMPH:  no palpable lymphadenopathy, no hepatosplenomegaly BREAST:post-mastectomy site well healed and free of suspicious changes LUNGS: clear to auscultation and percussion HEART: regular rate & rhythm, no murmurs and no gallops ABDOMEN:abdomen soft, non-tender and normal bowel sounds BACK: Back symmetric, no curvature., No CVA tenderness EXTREMITIES:less then 2 second capillary refill, no joint deformities,  effusion, or inflammation, positive findings:  edema left upper extremity lymphedema with woody infiltration  NEURO: alert & oriented x 3 with fluent speech, no focal motor/sensory deficits, in wheelchair.   LABORATORY DATA: CBC    Component Value Date/Time   WBC 3.8* 04/29/2014 1510   WBC 7.1 01/24/2012 1539   RBC 3.08* 04/29/2014 1510   RBC 3.00* 10/27/2013 0800   RBC 4.39 01/24/2012 1539   HGB 12.6 04/29/2014 1510   HGB 14.7 01/24/2012 1539   HCT 37.7 04/29/2014 1510   HCT 43.7 01/24/2012 1539   PLT 329 04/29/2014 1510   PLT 258 01/24/2012 1539   MCV 122.4* 04/29/2014 1510   MCV 99.5 01/24/2012 1539   MCH 40.9* 04/29/2014 1510   MCH 33.5 01/24/2012 1539   MCHC 33.4 04/29/2014 1510   MCHC 33.6 01/24/2012 1539   RDW 14.9 04/29/2014 1510   RDW 14.1 01/24/2012 1539   LYMPHSABS 1.1 04/29/2014 1510   LYMPHSABS 1.4 01/24/2012 1539   MONOABS 0.3 04/29/2014 1510   MONOABS 0.6 01/24/2012 1539   EOSABS 0.2 04/29/2014 1510   EOSABS 0.3 01/24/2012 1539   BASOSABS 0.0 04/29/2014 1510   BASOSABS 0.0 01/24/2012 1539      Chemistry      Component Value Date/Time   NA 134* 04/29/2014 1510   K 4.1 04/29/2014 1510   CL 102 04/29/2014 1510   CO2 21 04/29/2014 1510   BUN <5*  04/29/2014 1510   CREATININE 0.60 04/29/2014 1510      Component Value Date/Time   CALCIUM 5.7* 04/29/2014 1510   ALKPHOS 102 04/29/2014 1510   AST 97* 04/29/2014 1510   ALT 31 04/29/2014 1510   BILITOT 1.0 04/29/2014 1510     Lab Results  Component Value Date   LABCA2 59.8* 04/29/2014      ASSESSMENT AND PLAN:  Carcinoma of breast metastatic to bone ER/PR+, Her2 negative malignancy, on Letrozole and Ibrance 100 mg daily 3 weeks on and 1 week off every 4 weeks.  Tolerating well thus far.  On Xgeva therapy for bone metastases with Ca++ and Vit D to prevent hypocalcemia associated with Xgeva.    Labs today: CBC diff, CMET, CA 27.29, and CA 15-3.    Continue treatment as planned.    Return in 1  month for follow-up.   Cancer related pain On long-acting (MS Contin every 8 hours) and short-acting pain (Oxycodone 10-20 mg q 4 h PRN) medication.  Patient reported improved pain control.   Rx provided for MS Contin.    Rx for Oxycodone.   Hypocalcemia Patient is noncompliant with PO Calcium.  She is asymptomatic from hypocalcemia.  Compliance encouraged.    THERAPY PLAN:  NCCN guidelines recommends the following for monitoring of metastatic breast cancer:  A. Components of monitoring:   1. Monitoring includes periodic assessment of varied combinations of symptoms, physical examination, routine laboratory tests, imaging studies, and blood biomarkers where appropriate. Results of monitoring are classified as response/continued response to treatment, stable disease, uncertainty regarding disease status, or progression of disease. The clinician typically must assess and balance multiple different forms of information to make a determination regarding whether disease is being controlled and the toxicity of treatment is acceptable. Sometimes, this information may be contradictory.  B. Definition of disease progression:   1. Unequivocal evidence of progression of disease by one or more of these factors is required to establish progression of disease, either because of ineffective therapy or acquired resistance of disease to an applied therapy.  Progression of disease may be identified through evidence of growth or worsening of disease at previously known sites of disease and/or of the occurrence of new sites of metastatic disease.   2. Findings concerning for progression of disease include:    A. Worsening symptoms such as pain or dyspnea    B. Evidence of worsening or new disease on physical examination.    C. Declining performance status    D. Unexplained weight loss    E. Increasing Alkaline phosphatase, ALT, AST, or bilirubin    F. Hypercalcemia    G. New radiographic abnormality or  increase in the size of pre-existing radiographic abnormality.    H. New areas of abnormality on functional imaging (eg, bone scan, PET/CT scan)    I. Increasing tumor markers (eg, CEA, CA 15-3, CA27.29)   All questions were answered. The patient knows to call the clinic with any problems, questions or concerns. We can certainly see the patient much sooner if necessary.  Patient and plan discussed with Dr. Ancil Linsey and she is in agreement with the aforementioned.   This note is electronically signed by: [name] 06/10/2014 4:22 PM

## 2014-06-10 NOTE — Progress Notes (Signed)
Please see doctors encounter for more information 

## 2014-06-10 NOTE — Assessment & Plan Note (Signed)
Patient is noncompliant with PO Calcium.  She is asymptomatic from hypocalcemia.  Compliance encouraged.

## 2014-06-11 LAB — CANCER ANTIGEN 15-3: Cancer Antigen-Breast 15-3: 30 U/mL

## 2014-06-11 LAB — CANCER ANTIGEN 27.29: CA 27.29: 40 U/mL — ABNORMAL HIGH (ref 0.0–38.6)

## 2014-06-21 ENCOUNTER — Other Ambulatory Visit (HOSPITAL_COMMUNITY): Payer: Self-pay | Admitting: Oncology

## 2014-06-28 ENCOUNTER — Other Ambulatory Visit (HOSPITAL_COMMUNITY): Payer: Self-pay | Admitting: Oncology

## 2014-07-05 ENCOUNTER — Other Ambulatory Visit (HOSPITAL_COMMUNITY): Payer: Self-pay

## 2014-07-05 DIAGNOSIS — C50919 Malignant neoplasm of unspecified site of unspecified female breast: Secondary | ICD-10-CM

## 2014-07-05 DIAGNOSIS — C7951 Secondary malignant neoplasm of bone: Principal | ICD-10-CM

## 2014-07-08 ENCOUNTER — Encounter (HOSPITAL_COMMUNITY): Payer: Self-pay

## 2014-07-08 ENCOUNTER — Encounter (HOSPITAL_COMMUNITY): Payer: Self-pay | Attending: Oncology | Admitting: Hematology & Oncology

## 2014-07-08 ENCOUNTER — Encounter (HOSPITAL_COMMUNITY): Payer: Self-pay | Admitting: Hematology & Oncology

## 2014-07-08 VITALS — BP 96/63 | HR 96 | Temp 98.1°F | Resp 16 | Wt 191.7 lb

## 2014-07-08 DIAGNOSIS — C50919 Malignant neoplasm of unspecified site of unspecified female breast: Secondary | ICD-10-CM

## 2014-07-08 DIAGNOSIS — C7951 Secondary malignant neoplasm of bone: Secondary | ICD-10-CM | POA: Insufficient documentation

## 2014-07-08 DIAGNOSIS — R978 Other abnormal tumor markers: Secondary | ICD-10-CM | POA: Insufficient documentation

## 2014-07-08 DIAGNOSIS — C50312 Malignant neoplasm of lower-inner quadrant of left female breast: Secondary | ICD-10-CM | POA: Insufficient documentation

## 2014-07-08 DIAGNOSIS — R938 Abnormal findings on diagnostic imaging of other specified body structures: Secondary | ICD-10-CM | POA: Insufficient documentation

## 2014-07-08 DIAGNOSIS — F411 Generalized anxiety disorder: Secondary | ICD-10-CM | POA: Insufficient documentation

## 2014-07-08 DIAGNOSIS — G893 Neoplasm related pain (acute) (chronic): Secondary | ICD-10-CM

## 2014-07-08 LAB — COMPREHENSIVE METABOLIC PANEL
ALBUMIN: 3.8 g/dL (ref 3.5–5.0)
ALT: 29 U/L (ref 14–54)
AST: 46 U/L — ABNORMAL HIGH (ref 15–41)
Alkaline Phosphatase: 72 U/L (ref 38–126)
Anion gap: 7 (ref 5–15)
BILIRUBIN TOTAL: 0.5 mg/dL (ref 0.3–1.2)
BUN: 11 mg/dL (ref 6–20)
CALCIUM: 9 mg/dL (ref 8.9–10.3)
CO2: 29 mmol/L (ref 22–32)
Chloride: 102 mmol/L (ref 101–111)
Creatinine, Ser: 0.71 mg/dL (ref 0.44–1.00)
GFR calc Af Amer: >60 mL/min (ref >60)
GFR calc non Af Amer: >60 mL/min (ref >60)
Glucose, Bld: 106 mg/dL — ABNORMAL HIGH (ref 65–99)
POTASSIUM: 5 mmol/L (ref 3.5–5.1)
SODIUM: 138 mmol/L (ref 135–145)
TOTAL PROTEIN: 7.2 g/dL (ref 6.5–8.1)

## 2014-07-08 LAB — CBC WITH DIFFERENTIAL/PLATELET
Basophils Absolute: 0.1 10*3/uL (ref 0.0–0.1)
Basophils Relative: 1 % (ref 0–1)
EOS ABS: 0.2 10*3/uL (ref 0.0–0.7)
EOS PCT: 4 % (ref 0–5)
HCT: 35.5 % — ABNORMAL LOW (ref 36.0–46.0)
HEMOGLOBIN: 11.9 g/dL — AB (ref 12.0–15.0)
LYMPHS PCT: 27 % (ref 12–46)
Lymphs Abs: 1.1 10*3/uL (ref 0.7–4.0)
MCH: 39.5 pg — ABNORMAL HIGH (ref 26.0–34.0)
MCHC: 33.5 g/dL (ref 30.0–36.0)
MCV: 117.9 fL — ABNORMAL HIGH (ref 78.0–100.0)
MONO ABS: 0.6 10*3/uL (ref 0.1–1.0)
Monocytes Relative: 15 % — ABNORMAL HIGH (ref 3–12)
NEUTROS ABS: 2.1 10*3/uL (ref 1.7–7.7)
Neutrophils Relative %: 53 % (ref 43–77)
PLATELETS: 239 10*3/uL (ref 150–400)
RBC: 3.01 MIL/uL — ABNORMAL LOW (ref 3.87–5.11)
RDW: 13.8 % (ref 11.5–15.5)
WBC: 3.9 10*3/uL — AB (ref 4.0–10.5)

## 2014-07-08 MED ORDER — MORPHINE SULFATE ER 60 MG PO TBCR: 60.0000 mg | EXTENDED_RELEASE_TABLET | Freq: Three times a day (TID) | ORAL | Status: DC

## 2014-07-08 NOTE — Patient Instructions (Signed)
[place] at [facility] Discharge Instructions  RECOMMENDATIONS MADE BY THE CONSULTANT AND ANY TEST RESULTS WILL BE SENT TO YOUR REFERRING PHYSICIAN.  Exam and discussion by Dr. Whitney Muse. Will hold xgeva today due to your jaw pain. Morphine - refill - take as directed. Report any new lumps, bone pain, shortness of breath or other symptoms.  Follow-up in 1 month with labs and office visit.  Thank you for choosing [place] at [facility] to provide your oncology and hematology care.  To afford each patient quality time with our provider, please arrive at least 15 minutes before your scheduled appointment time.    You need to re-schedule your appointment should you arrive 10 or more minutes late.  We strive to give you quality time with our providers, and arriving late affects you and other patients whose appointments are after yours.  Also, if you no show three or more times for appointments you may be dismissed from the clinic at the providers discretion.     Again, thank you for choosing [facility].  Our hope is that these requests will decrease the amount of time that you wait before being seen by our physicians.       _____________________________________________________________  Should you have questions after your visit to [facility], please contact our office at [phone] between the hours of 8:30 a.m. and 4:30 p.m.  Voicemails left after 4:30 p.m. will not be returned until the following business day.  For prescription refill requests, have your pharmacy contact our office.

## 2014-07-08 NOTE — Progress Notes (Signed)
No Xgeva today per Dr.Penland.

## 2014-07-08 NOTE — Progress Notes (Signed)
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 Xgeva beginning on 01/17/2014.  INTERVAL HISTORY: Brittany Blair 54 y.o. female returns for followup of Metastatic breast cancer to bone after undergoing an iliac bone biopsy on 12/08/2013 demonstrating an ER/PR +, Her2 negative breast cancer. She was started on Ibrance plus letrozole will be given with ibrance 100 mg daily for 3 weeks out of every 4 and letrozole 2.5 mg daily.   Her pain has improved. She is sleeping and eating well. She reports that Ibrance causes her to have joint pain specifically in her knees and elbows. She reports increased pain in her right hip. She states that she is more active, but "pays for it". In bed for 2 days following her days of increase activity. Her husband feels that her mood has improved. She uses hydrocodone on her "bad pain days". The patient reports dental pain for the past 2 weeks. She is unhappy about her dental pain. The pain radiates up the right side of her head. She has not seen a dentist.   She denies fever, chills, nausea or vomiting. She states she takes her medication as prescribed. She continues to smoke.     Carcinoma of breast metastatic to bone   12/08/2013 Progression Iliac bone biopsy consistent with adenocarcinoma, ER/PR positive, HER-2/neu not overexpressed   12/23/2013 -  Chemotherapy Ibrance 100 mg daily for 3 weeks out of every 4 and letrozole 2.5 mg daily.   01/17/2014 Miscellaneous Xgeva 120 mg SQ every 28 days for bone metastases.   Oncologically, she denies any complaints and ROS questioning is negative.   Past Medical History  Diagnosis Date  . Anxiety   . Herpes   . MRSA (methicillin resistant Staphylococcus aureus)   . Breast lump     left  . Hypertension   . Hematoma   . Swelling       left foot  . Multiple blisters     along surgical site  . Depression   . Genital herpes 08/04/2012  . PMB (postmenopausal bleeding) 06/23/2013  . Lymph edema   . Vaginal delivery 1981, 1984, 1987, 1992  . Breast cancer dx'd 05/27/2010    chemo/xrt comp 01/2011  . Carcinoma of breast metastatic to bone 05/28/2010    This patient presented in April with an invasive ductal carcinoma that had broken through the skin and was contaminated with MRSA. She was stage III. It was lower inner quadrant. It was receptor positive, HER-2/neu negative, and had a KI-67 of 75%.  She was treated with neoadjuvant chemotherapy. She was recently hospitalized (June, 2012,) with multiple superficial MRSA infections. These have all r    has Carcinoma of breast metastatic to bone; Syncope; GAD (generalized anxiety disorder); HTN (hypertension), benign; Chronic chest pain; Depression; Transaminitis; Genital herpes; PMB (postmenopausal bleeding); Acute blood loss anemia; Abdominal pain; AKI (acute kidney injury); GIB (gastrointestinal bleeding); Syncope and collapse; Prepyloric ulcer; Left sided numbness; Hypotension; Hypocalcemia; Infiltrating ductal carcinoma of left breast; and Cancer related pain on her problem list.     is allergic to bee venom.  Current Outpatient Prescriptions on File Prior to Visit  Medication Sig Dispense Refill  . acyclovir (ZOVIRAX) 400 MG tablet Take 1 tablet (400 mg total) by mouth 2 (two) times  daily. 60 tablet 6  . ALPRAZolam (XANAX) 1 MG tablet Take 1 tablet (1 mg total) by mouth 3 (three) times daily. 90 tablet 5  . amLODipine (NORVASC) 5 MG tablet Take 1 tablet (5 mg total) by mouth daily. 180 tablet 2  . calcium-vitamin D (OSCAL WITH D) 500-200 MG-UNIT per tablet Take 2 tablets by mouth daily with breakfast. 60 tablet 11  . clotrimazole (LOTRIMIN) 1 % cream Apply 1 application topically 2 (two) times daily as needed (yeast rash).    Marland Kitchen letrozole (FEMARA) 2.5 MG tablet Take 1 tablet (2.5  mg total) by mouth daily. 90 tablet 3  . lisinopril (PRINIVIL,ZESTRIL) 40 MG tablet TAKE 1 TABLET BY MOUTH ONCE DAILY 30 tablet 3  . loratadine (CLARITIN) 10 MG tablet Take 10 mg by mouth daily as needed for allergies.     . metoprolol succinate (TOPROL-XL) 25 MG 24 hr tablet TAKE 3 TABLETS BY MOUTH ONCE DAILY WITH OR IMMEDIATELY FOLLOWING A MEAL 90 tablet 2  . Oxycodone HCl 10 MG TABS Take 1 or 2 tablets every 4 hours to control pain. 100 tablet 0  . palbociclib (IBRANCE) 100 MG capsule Take 1 capsule daily with food for 21 days, rest 7 days, then resume taking the medicine 3 weeks on, 1 week off. 21 capsule 6  . polyethylene glycol (MIRALAX / GLYCOLAX) packet Take 8.5 g by mouth daily as needed for moderate constipation.    . pregabalin (LYRICA) 75 MG capsule One tablet each evening for 5 days then increase to two tablets thereafter 60 capsule 3  . promethazine (PHENERGAN) 25 MG tablet Take 1 tablet (25 mg total) by mouth every 6 (six) hours as needed for nausea or vomiting. 30 tablet 6  . tetrahydrozoline-zinc (VISINE-AC) 0.05-0.25 % ophthalmic solution Place 2 drops into both eyes 3 (three) times daily as needed (for allergies).     . traZODone (DESYREL) 50 MG tablet TAKE 1 TABLET BY MOUTH AT BEDTIME. 30 tablet 6  . venlafaxine XR (EFFEXOR-XR) 75 MG 24 hr capsule TAKE 3 CAPSULES BY MOUTH DAILY 90 capsule 2  . [DISCONTINUED] FLUoxetine (PROZAC) 20 MG tablet Take 40 mg by mouth daily.       No current facility-administered medications on file prior to visit.     Past Surgical History  Procedure Laterality Date  . Tubal ligation    . Wisdom tooth extraction    . Mastectomy modified radical  10/10/10     left -Dr Margot Chimes  . Laparoscopic assisted vaginal hysterectomy Bilateral 10/14/2013    Procedure: LAPAROSCOPIC ASSISTED VAGINAL HYSTERECTOMY;  Surgeon: Ena Dawley, MD;  Location: [facility];  Service: Gynecology;  Laterality: Bilateral;  . Cystoscopy N/A 10/14/2013    Procedure: CYSTOSCOPY;   Surgeon: Ena Dawley, MD;  Location: [facility];  Service: Gynecology;  Laterality: N/A;  . Laparoscopic lysis of adhesions N/A 10/14/2013    Procedure: LAPAROSCOPIC LYSIS OF ADHESIONS;  Surgeon: Ena Dawley, MD;  Location: [facility];  Service: Gynecology;  Laterality: N/A;  . Laparoscopic bilateral salpingo oopherectomy Bilateral 10/14/2013    Procedure: LAPAROSCOPIC BILATERAL SALPINGO OOPHORECTOMY;  Surgeon: Ena Dawley, MD;  Location: [facility];  Service: Gynecology;  Laterality: Bilateral;  . Esophagogastroduodenoscopy N/A 10/27/2013    Procedure: ESOPHAGOGASTRODUODENOSCOPY (EGD);  Surgeon: Wonda Horner, MD;  Location: [facility];  Service: Endoscopy;  Laterality: N/A;  . Bone biopsy Left 11/2013    left post. hip    Denies any headaches, dizziness, double vision, fevers, chills, night sweats, nausea, vomiting, diarrhea, constipation,  chest pain, heart palpitations, shortness of breath, blood in stool, black tarry stool, urinary pain, urinary burning, urinary frequency, hematuria.   PHYSICAL EXAMINATION  ECOG PERFORMANCE STATUS: 2 - Symptomatic, <50% confined to bed  Filed Vitals:   07/08/14 1502  BP: 96/63  Pulse: 96  Temp: 98.1 F (36.7 C)  Resp: 16    GENERAL:alert, no distress, well nourished, well developed, comfortable, cooperative, obese, smiling and accompanied by husband SKIN: skin color, texture, turgor are normal, no rashes or significant lesions HEAD: Normocephalic, No masses, lesions, tenderness or abnormalities EYES: normal, PERRLA, EOMI, Conjunctiva are pink and non-injected EARS: External ears normal OROPHARYNX:lips, buccal mucosa, and tongue normal and mucous membranes are moist  NECK: supple, no adenopathy, thyroid normal size, non-tender, without nodularity, no stridor, non-tender, trachea midline LYMPH:  no palpable lymphadenopathy, no hepatosplenomegaly BREAST:post-mastectomy site well healed and free of suspicious changes LUNGS: clear to auscultation and  percussion HEART: regular rate & rhythm, no murmurs and no gallops ABDOMEN:abdomen soft, non-tender and normal bowel sounds BACK: Back symmetric, no curvature., No CVA tenderness EXTREMITIES:less then 2 second capillary refill, no joint deformities, effusion, or inflammation, positive findings:  edema left upper extremity lymphedema with woody infiltration  NEURO: alert & oriented x 3 with fluent speech, no focal motor/sensory deficits, in wheelchair.   LABORATORY DATA: Results for Brittany, Blair (MRN [id number]) as of 07/10/2014 16:14  Ref. Range 07/08/2014 14:50  Sodium Latest Ref Range: 135-145 mmol/L 138  Potassium Latest Ref Range: 3.5-5.1 mmol/L 5.0  Chloride Latest Ref Range: 101-111 mmol/L 102  CO2 Latest Ref Range: 22-32 mmol/L 29  BUN Latest Ref Range: 6-20 mg/dL 11  Creatinine Latest Ref Range: 0.44-1.00 mg/dL 0.71  Calcium Latest Ref Range: 8.9-10.3 mg/dL 9.0  EGFR (Non-African Amer.) Latest Ref Range: >60 mL/min >60  EGFR (African American) Latest Ref Range: >60 mL/min >60  Glucose Latest Ref Range: 65-99 mg/dL 106 (H)  Anion gap Latest Ref Range: 5-15  7  Alkaline Phosphatase Latest Ref Range: 38-126 U/L 72  Albumin Latest Ref Range: 3.5-5.0 g/dL 3.8  AST Latest Ref Range: 15-41 U/L 46 (H)  ALT Latest Ref Range: 14-54 U/L 29  Total Protein Latest Ref Range: 6.5-8.1 g/dL 7.2  Total Bilirubin Latest Ref Range: 0.3-1.2 mg/dL 0.5  WBC Latest Ref Range: 4.0-10.5 K/uL 3.9 (L)  RBC Latest Ref Range: 3.87-5.11 MIL/uL 3.01 (L)  Hemoglobin Latest Ref Range: 12.0-15.0 g/dL 11.9 (L)  HCT Latest Ref Range: 36.0-46.0 % 35.5 (L)  MCV Latest Ref Range: 78.0-100.0 fL 117.9 (H)  MCH Latest Ref Range: 26.0-34.0 pg 39.5 (H)  MCHC Latest Ref Range: 30.0-36.0 g/dL 33.5  RDW Latest Ref Range: 11.5-15.5 % 13.8  Platelets Latest Ref Range: 150-400 K/uL 239  Neutrophils Latest Ref Range: 43-77 % 53  Lymphocytes Latest Ref Range: 12-46 % 27  Monocytes Relative Latest Ref Range: 3-12 % 15 (H)    Eosinophil Latest Ref Range: 0-5 % 4  Basophil Latest Ref Range: 0-1 % 1  NEUT# Latest Ref Range: 1.7-7.7 K/uL 2.1  Lymphocyte # Latest Ref Range: 0.7-4.0 K/uL 1.1  Monocyte # Latest Ref Range: 0.1-1.0 K/uL 0.6  Eosinophils Absolute Latest Ref Range: 0.0-0.7 K/uL 0.2  Basophils Absolute Latest Ref Range: 0.0-0.1 K/uL 0.1  Smear Review Unknown MORPHOLOGY UNREMA...  CA 27.29 Latest Ref Range: 0.0-38.6 U/mL 40.4 (H)   Lab Results  Component Value Date   LABCA2 40.4* 07/08/2014      ASSESSMENT AND PLAN:  Stage IV ER+, PR+ Her-2 neu - breast cancer  Ongoing Tobacco Use Cancer Related Pain  She is to continue on her ibrance and femara. She has excellent tolerance and her disease is controlled clinically and by "tumor marker" monitoring.  She is to continue with her current pain regimen as her pain is much better controlled than previously.   She is not interested in discontinuing smoking and we discussed the health benefits of smoking cessation. I addressed ways we could help her quit, but again the patient declines.  We are going to hold XGEVA today. Her oral exam is unremarkable.  i have encouraged her to f/u with a dentist.  If her pain persists we will consider additional evaluation for ONJ such as plain films. If she has persistent jaw pain I would discontinue additional XGEVA temporarily.  From uptodate: For patients who develop ONJ while receiving therapy with an antiresorptive agent, recommendations on management are hindered by a lack of evidence There are no prospective data to advise the patient or physician as to the benefits of discontinuing therapy with the antiresorptive or antiangiogenic agent For patients receiving antiresorptive therapy,many clinicians discontinue therapy, at least temporarily, and restart after resolution or signs of improvement or stabilization.   We will see her back again in one month.   All questions were answered. The patient knows to call the  clinic with any problems, questions or concerns. We can certainly see the patient much sooner if necessary.  This document serves as a record of services personally performed by Ancil Linsey, MD. It was created on her behalf by Pearlie Oyster, a trained medical scribe. The creation of this record is based on the scribe's personal observations and the provider's statements to them. This document has been checked and approved by the attending provider.    I have reviewed the above documentation for accuracy and completeness, and I agree with the above.  Kelby Fam. Lerry Cordrey MD

## 2014-07-09 LAB — CANCER ANTIGEN 27.29: CA 27.29: 40.4 U/mL — ABNORMAL HIGH (ref 0.0–38.6)

## 2014-07-10 ENCOUNTER — Encounter (HOSPITAL_COMMUNITY): Payer: Self-pay | Admitting: Hematology & Oncology

## 2014-07-20 ENCOUNTER — Other Ambulatory Visit (HOSPITAL_COMMUNITY): Payer: Self-pay | Admitting: Oncology

## 2014-07-20 ENCOUNTER — Telehealth (HOSPITAL_COMMUNITY): Payer: Self-pay | Admitting: *Deleted

## 2014-07-20 DIAGNOSIS — C50919 Malignant neoplasm of unspecified site of unspecified female breast: Secondary | ICD-10-CM

## 2014-07-20 DIAGNOSIS — C7951 Secondary malignant neoplasm of bone: Principal | ICD-10-CM

## 2014-07-20 MED ORDER — OXYCODONE HCL 10 MG PO TABS: ORAL_TABLET | ORAL | Status: DC

## 2014-07-20 NOTE — Telephone Encounter (Signed)
Ready for pick up

## 2014-07-20 NOTE — Telephone Encounter (Signed)
Spoke with patient. Prescription for oxycodone is ready for pick up. Clinic closing at 4 am, RX will need to be picked up before then or tomorrow after we open. Verbalized understanding.

## 2014-07-26 ENCOUNTER — Other Ambulatory Visit (HOSPITAL_COMMUNITY): Payer: Self-pay | Admitting: Oncology

## 2014-07-26 ENCOUNTER — Telehealth (HOSPITAL_COMMUNITY): Payer: Self-pay | Admitting: *Deleted

## 2014-07-26 DIAGNOSIS — C7951 Secondary malignant neoplasm of bone: Secondary | ICD-10-CM

## 2014-07-26 DIAGNOSIS — C50919 Malignant neoplasm of unspecified site of unspecified female breast: Secondary | ICD-10-CM

## 2014-07-26 MED ORDER — PREGABALIN 75 MG PO CAPS: 150.0000 mg | ORAL_CAPSULE | Freq: Every day | ORAL | Status: DC

## 2014-07-26 NOTE — Telephone Encounter (Signed)
Refilled called in to [place], PA-C 07/26/2014 1:01 PM

## 2014-08-01 ENCOUNTER — Other Ambulatory Visit (HOSPITAL_COMMUNITY): Payer: Self-pay | Admitting: Oncology

## 2014-08-05 ENCOUNTER — Ambulatory Visit (HOSPITAL_COMMUNITY): Payer: Self-pay

## 2014-08-05 ENCOUNTER — Ambulatory Visit (HOSPITAL_COMMUNITY): Payer: Self-pay | Admitting: Oncology

## 2014-08-05 ENCOUNTER — Other Ambulatory Visit (HOSPITAL_COMMUNITY): Payer: Self-pay

## 2014-08-09 ENCOUNTER — Telehealth (HOSPITAL_COMMUNITY): Payer: Self-pay | Admitting: *Deleted

## 2014-08-12 ENCOUNTER — Other Ambulatory Visit (HOSPITAL_COMMUNITY): Payer: Self-pay | Admitting: *Deleted

## 2014-08-12 DIAGNOSIS — C7951 Secondary malignant neoplasm of bone: Principal | ICD-10-CM

## 2014-08-12 DIAGNOSIS — C50919 Malignant neoplasm of unspecified site of unspecified female breast: Secondary | ICD-10-CM

## 2014-08-12 DIAGNOSIS — G893 Neoplasm related pain (acute) (chronic): Secondary | ICD-10-CM

## 2014-08-12 MED ORDER — MORPHINE SULFATE ER 60 MG PO TBCR: 60.0000 mg | EXTENDED_RELEASE_TABLET | Freq: Three times a day (TID) | ORAL | Status: DC

## 2014-08-19 ENCOUNTER — Ambulatory Visit (HOSPITAL_COMMUNITY): Payer: Self-pay | Admitting: Oncology

## 2014-08-19 ENCOUNTER — Other Ambulatory Visit (HOSPITAL_COMMUNITY): Payer: Self-pay

## 2014-08-19 ENCOUNTER — Ambulatory Visit (HOSPITAL_COMMUNITY): Payer: Self-pay

## 2014-08-19 MED ORDER — OXYCODONE HCL 10 MG PO TABS: ORAL_TABLET | ORAL | Status: DC

## 2014-08-26 ENCOUNTER — Ambulatory Visit (HOSPITAL_COMMUNITY): Payer: Self-pay

## 2014-08-26 ENCOUNTER — Other Ambulatory Visit (HOSPITAL_COMMUNITY): Payer: Self-pay

## 2014-08-26 ENCOUNTER — Ambulatory Visit (HOSPITAL_COMMUNITY): Payer: Self-pay | Admitting: Oncology

## 2014-08-26 NOTE — Progress Notes (Signed)
No show  KEFALAS,THOMAS 08/26/2014 2:36 PM

## 2014-09-02 ENCOUNTER — Ambulatory Visit (HOSPITAL_COMMUNITY): Payer: Self-pay | Admitting: Hematology & Oncology

## 2014-09-02 ENCOUNTER — Other Ambulatory Visit (HOSPITAL_COMMUNITY): Payer: Self-pay

## 2014-09-02 ENCOUNTER — Ambulatory Visit (HOSPITAL_COMMUNITY): Payer: Self-pay

## 2014-09-08 ENCOUNTER — Encounter (HOSPITAL_COMMUNITY): Payer: Self-pay | Admitting: Oncology

## 2014-09-08 ENCOUNTER — Encounter (HOSPITAL_COMMUNITY): Payer: Self-pay

## 2014-09-08 ENCOUNTER — Encounter (HOSPITAL_COMMUNITY): Payer: Self-pay | Attending: Oncology | Admitting: Oncology

## 2014-09-08 VITALS — BP 102/60 | HR 97 | Temp 98.7°F | Resp 18 | Wt 212.3 lb

## 2014-09-08 DIAGNOSIS — R938 Abnormal findings on diagnostic imaging of other specified body structures: Secondary | ICD-10-CM | POA: Insufficient documentation

## 2014-09-08 DIAGNOSIS — M79604 Pain in right leg: Secondary | ICD-10-CM

## 2014-09-08 DIAGNOSIS — G893 Neoplasm related pain (acute) (chronic): Secondary | ICD-10-CM

## 2014-09-08 DIAGNOSIS — M79605 Pain in left leg: Secondary | ICD-10-CM

## 2014-09-08 DIAGNOSIS — C50912 Malignant neoplasm of unspecified site of left female breast: Secondary | ICD-10-CM

## 2014-09-08 DIAGNOSIS — C7951 Secondary malignant neoplasm of bone: Secondary | ICD-10-CM | POA: Insufficient documentation

## 2014-09-08 DIAGNOSIS — F411 Generalized anxiety disorder: Secondary | ICD-10-CM | POA: Insufficient documentation

## 2014-09-08 DIAGNOSIS — C50312 Malignant neoplasm of lower-inner quadrant of left female breast: Secondary | ICD-10-CM | POA: Insufficient documentation

## 2014-09-08 DIAGNOSIS — G8929 Other chronic pain: Secondary | ICD-10-CM

## 2014-09-08 DIAGNOSIS — R978 Other abnormal tumor markers: Secondary | ICD-10-CM | POA: Insufficient documentation

## 2014-09-08 DIAGNOSIS — C50919 Malignant neoplasm of unspecified site of unspecified female breast: Secondary | ICD-10-CM

## 2014-09-08 DIAGNOSIS — M545 Low back pain: Secondary | ICD-10-CM

## 2014-09-08 LAB — COMPREHENSIVE METABOLIC PANEL
ALBUMIN: 3.9 g/dL (ref 3.5–5.0)
ALK PHOS: 84 U/L (ref 38–126)
ALT: 34 U/L (ref 14–54)
AST: 38 U/L (ref 15–41)
Anion gap: 10 (ref 5–15)
BUN: 10 mg/dL (ref 6–20)
CALCIUM: 8.7 mg/dL — AB (ref 8.9–10.3)
CO2: 23 mmol/L (ref 22–32)
Chloride: 104 mmol/L (ref 101–111)
Creatinine, Ser: 0.82 mg/dL (ref 0.44–1.00)
GFR calc Af Amer: >60 mL/min (ref >60)
GFR calc non Af Amer: >60 mL/min (ref >60)
Glucose, Bld: 129 mg/dL — ABNORMAL HIGH (ref 65–99)
POTASSIUM: 4.2 mmol/L (ref 3.5–5.1)
Sodium: 137 mmol/L (ref 135–145)
Total Bilirubin: 0.4 mg/dL (ref 0.3–1.2)
Total Protein: 7.3 g/dL (ref 6.5–8.1)

## 2014-09-08 LAB — CBC WITH DIFFERENTIAL/PLATELET
BASOS ABS: 0 10*3/uL (ref 0.0–0.1)
BASOS PCT: 1 % (ref 0–1)
Eosinophils Absolute: 0.1 10*3/uL (ref 0.0–0.7)
Eosinophils Relative: 3 % (ref 0–5)
HEMATOCRIT: 32.7 % — AB (ref 36.0–46.0)
HEMOGLOBIN: 11.2 g/dL — AB (ref 12.0–15.0)
LYMPHS ABS: 0.8 10*3/uL (ref 0.7–4.0)
Lymphocytes Relative: 18 % (ref 12–46)
MCH: 39 pg — AB (ref 26.0–34.0)
MCHC: 34.3 g/dL (ref 30.0–36.0)
MCV: 113.9 fL — ABNORMAL HIGH (ref 78.0–100.0)
Monocytes Absolute: 0.4 10*3/uL (ref 0.1–1.0)
Monocytes Relative: 10 % (ref 3–12)
NEUTROS ABS: 2.9 10*3/uL (ref 1.7–7.7)
Neutrophils Relative %: 69 % (ref 43–77)
Platelets: 271 10*3/uL (ref 150–400)
RBC: 2.87 MIL/uL — ABNORMAL LOW (ref 3.87–5.11)
RDW: 14.7 % (ref 11.5–15.5)
WBC: 4.3 10*3/uL (ref 4.0–10.5)

## 2014-09-08 MED ORDER — MORPHINE SULFATE ER 60 MG PO TBCR: 60.0000 mg | EXTENDED_RELEASE_TABLET | Freq: Three times a day (TID) | ORAL | Status: DC

## 2014-09-08 MED ORDER — OXYCODONE HCL 10 MG PO TABS: ORAL_TABLET | ORAL | Status: DC

## 2014-09-08 NOTE — Patient Instructions (Signed)
Please see doctors encounter for more information 

## 2014-09-08 NOTE — Progress Notes (Signed)
No PCP Per Patient No address on file  Carcinoma of breast metastatic to bone, unspecified laterality - Plan: CT Abdomen Pelvis W Contrast, CT Chest W Contrast, NM Bone Scan Whole Body, Oxycodone HCl 10 MG TABS, morphine (MS CONTIN) 60 MG 12 hr tablet, DISCONTINUED: morphine (MS CONTIN) 60 MG 12 hr tablet, DISCONTINUED: Oxycodone HCl 10 MG TABS  Cancer related pain - Plan: morphine (MS CONTIN) 60 MG 12 hr tablet, DISCONTINUED: morphine (MS CONTIN) 60 MG 12 hr tablet  Breast cancer, unspecified laterality - Plan: Oxycodone HCl 10 MG TABS, DISCONTINUED: Oxycodone HCl 10 MG TABS  CURRENT THERAPY: Ibrance 100 mg daily 3/1 fashion plus letrozole daily. Xgeva beginning on 01/17/2014.  INTERVAL HISTORY: Brittany Blair 54 y.o. female returns for followup of Metastatic breast cancer to bone after undergoing an iliac bone biopsy on 12/08/2013 demonstrating an ER/PR +, Her2 negative breast cancer. She was started on Ibrance plus letrozole will be given with ibrance 100 mg daily for 3 weeks out of every 4 and letrozole 2.5 mg daily.     Carcinoma of breast metastatic to bone   12/08/2013 Progression Iliac bone biopsy consistent with adenocarcinoma, ER/PR positive, HER-2/neu not overexpressed   12/23/2013 -  Chemotherapy Ibrance 100 mg daily for 3 weeks out of every 4 and letrozole 2.5 mg daily.   01/17/2014 Miscellaneous Xgeva 120 mg SQ every 28 days for bone metastases.    I personally reviewed and went over laboratory results with the patient.  The results are noted within this dictation.  She reports new pain in new areas, including upper and lower back, legs, pelvis, etc.  She notes that they are new pains that are unlike her chronic pain.  As a result, it is time to restage her with imaging tests.  She is agreeable to this.  She notes new nodules in her breast, but I do not notes them on exam, however, there is some superior areolar dimpling appreciated on exam on right breast.    She reports that she feels great.  Oncologically, she denies any complaints and ROS questioning is negative.   Past Medical History  Diagnosis Date  . Anxiety   . Herpes   . MRSA (methicillin resistant Staphylococcus aureus)   . Breast lump     left  . Hypertension   . Hematoma   . Swelling     left foot  . Multiple blisters     along surgical site  . Depression   . Genital herpes 08/04/2012  . PMB (postmenopausal bleeding) 06/23/2013  . Lymph edema   . Vaginal delivery 1981, 1984, 1987, 1992  . Breast cancer dx'd 05/27/2010    chemo/xrt comp 01/2011  . Carcinoma of breast metastatic to bone 05/28/2010    This patient presented in April with an invasive ductal carcinoma that had broken through the skin and was contaminated with MRSA. She was stage III. It was lower inner quadrant. It was receptor positive, HER-2/neu negative, and had a KI-67 of 75%.  She was treated with neoadjuvant chemotherapy. She was recently hospitalized (June, 2012,) with multiple superficial MRSA infections. These have all r    has Carcinoma of breast metastatic to bone; Syncope; GAD (generalized anxiety disorder); HTN (hypertension), benign; Chronic chest pain; Depression; Transaminitis; Genital herpes; PMB (postmenopausal bleeding); Acute blood loss anemia; Abdominal pain; AKI (acute kidney injury); GIB (gastrointestinal bleeding); Syncope and collapse; Prepyloric ulcer; Left sided numbness; Hypotension; Hypocalcemia; Infiltrating ductal carcinoma of left breast; and  Cancer related pain on her problem list.     is allergic to bee venom.  Brittany Blair had no medications administered during this visit.  Past Surgical History  Procedure Laterality Date  . Tubal ligation    . Wisdom tooth extraction    . Mastectomy modified radical  10/10/10     left -Dr Margot Chimes  . Laparoscopic assisted vaginal hysterectomy Bilateral 10/14/2013    Procedure: LAPAROSCOPIC ASSISTED VAGINAL HYSTERECTOMY;  Surgeon: Ena Dawley, MD;  Location: [facility];  Service: Gynecology;  Laterality: Bilateral;  . Cystoscopy N/A 10/14/2013    Procedure: CYSTOSCOPY;  Surgeon: Ena Dawley, MD;  Location: [facility];  Service: Gynecology;  Laterality: N/A;  . Laparoscopic lysis of adhesions N/A 10/14/2013    Procedure: LAPAROSCOPIC LYSIS OF ADHESIONS;  Surgeon: Ena Dawley, MD;  Location: [facility];  Service: Gynecology;  Laterality: N/A;  . Laparoscopic bilateral salpingo oopherectomy Bilateral 10/14/2013    Procedure: LAPAROSCOPIC BILATERAL SALPINGO OOPHORECTOMY;  Surgeon: Ena Dawley, MD;  Location: [facility];  Service: Gynecology;  Laterality: Bilateral;  . Esophagogastroduodenoscopy N/A 10/27/2013    Procedure: ESOPHAGOGASTRODUODENOSCOPY (EGD);  Surgeon: Wonda Horner, MD;  Location: [facility];  Service: Endoscopy;  Laterality: N/A;  . Bone biopsy Left 11/2013    left post. hip    Denies any headaches, dizziness, double vision, fevers, chills, night sweats, nausea, vomiting, diarrhea, constipation, chest pain, heart palpitations, shortness of breath, blood in stool, black tarry stool, urinary pain, urinary burning, urinary frequency, hematuria.   PHYSICAL EXAMINATION  ECOG PERFORMANCE STATUS: 2 - Symptomatic, <50% confined to bed  Filed Vitals:   09/08/14 1434  BP: 102/60  Pulse: 97  Temp: 98.7 F (37.1 C)  Resp: 18    GENERAL:alert, no distress, well nourished, well developed, comfortable, cooperative, obese, smiling and accompanied by husband, malodorous body odor. SKIN: skin color, texture, turgor are normal, no rashes or significant lesions HEAD: Normocephalic, No masses, lesions, tenderness or abnormalities EYES: normal, PERRLA, EOMI, Conjunctiva are pink and non-injected EARS: External ears normal OROPHARYNX:lips, buccal mucosa, and tongue normal and mucous membranes are moist  NECK: supple, no adenopathy, thyroid normal size, non-tender, without nodularity, no stridor, non-tender, trachea  midline LYMPH:  no palpable lymphadenopathy, no hepatosplenomegaly BREAST:post-mastectomy site well healed and free of suspicious changes on left.  Right breast demonstrates superior areolar dimpling without any obvious lesions on exam. LUNGS: clear to auscultation and percussion HEART: regular rate & rhythm, no murmurs and no gallops ABDOMEN:abdomen soft, non-tender and normal bowel sounds, obese BACK: Back symmetric, no curvature., No CVA tenderness EXTREMITIES:less then 2 second capillary refill, no joint deformities, effusion, or inflammation, positive findings:  edema left upper extremity lymphedema with woody infiltration  NEURO: alert & oriented x 3 with fluent speech, no focal motor/sensory deficits, in wheelchair.   LABORATORY DATA: CBC    Component Value Date/Time   WBC 4.3 09/08/2014 1335   WBC 7.1 01/24/2012 1539   RBC 2.87* 09/08/2014 1335   RBC 3.00* 10/27/2013 0800   RBC 4.39 01/24/2012 1539   HGB 11.2* 09/08/2014 1335   HGB 14.7 01/24/2012 1539   HCT 32.7* 09/08/2014 1335   HCT 43.7 01/24/2012 1539   PLT 271 09/08/2014 1335   PLT 258 01/24/2012 1539   MCV 113.9* 09/08/2014 1335   MCV 99.5 01/24/2012 1539   MCH 39.0* 09/08/2014 1335   MCH 33.5 01/24/2012 1539   MCHC 34.3 09/08/2014 1335   MCHC 33.6 01/24/2012 1539   RDW 14.7 09/08/2014 1335   RDW 14.1  01/24/2012 1539   LYMPHSABS 0.8 09/08/2014 1335   LYMPHSABS 1.4 01/24/2012 1539   MONOABS 0.4 09/08/2014 1335   MONOABS 0.6 01/24/2012 1539   EOSABS 0.1 09/08/2014 1335   EOSABS 0.3 01/24/2012 1539   BASOSABS 0.0 09/08/2014 1335   BASOSABS 0.0 01/24/2012 1539      Chemistry      Component Value Date/Time   NA 137 09/08/2014 1335   K 4.2 09/08/2014 1335   CL 104 09/08/2014 1335   CO2 23 09/08/2014 1335   BUN 10 09/08/2014 1335   CREATININE 0.82 09/08/2014 1335      Component Value Date/Time   CALCIUM 8.7* 09/08/2014 1335   ALKPHOS 84 09/08/2014 1335   AST 38 09/08/2014 1335   ALT 34 09/08/2014 1335    BILITOT 0.4 09/08/2014 1335     Lab Results  Component Value Date   LABCA2 40.4* 07/08/2014      ASSESSMENT AND PLAN:  Carcinoma of breast metastatic to bone ER/PR+, Her2 negative malignancy, on Letrozole and Ibrance 100 mg daily 3 weeks on and 1 week off every 4 weeks.  Tolerating well thus far.  On Xgeva therapy for bone metastases with Ca++ and Vit D to prevent hypocalcemia associated with Xgeva.    Labs today: CBC diff, CMET, CA 27.29, and CA 15-3.    Continue treatment as planned.    Restaging CT CAP with contrast due to new pains.  Bone scan also ordered checking for new or progressive osseous disease.  Return following scans.   THERAPY PLAN:  NCCN guidelines recommends the following for monitoring of metastatic breast cancer:  A. Components of monitoring:   1. Monitoring includes periodic assessment of varied combinations of symptoms, physical examination, routine laboratory tests, imaging studies, and blood biomarkers where appropriate. Results of monitoring are classified as response/continued response to treatment, stable disease, uncertainty regarding disease status, or progression of disease. The clinician typically must assess and balance multiple different forms of information to make a determination regarding whether disease is being controlled and the toxicity of treatment is acceptable. Sometimes, this information may be contradictory.  B. Definition of disease progression:   1. Unequivocal evidence of progression of disease by one or more of these factors is required to establish progression of disease, either because of ineffective therapy or acquired resistance of disease to an applied therapy.  Progression of disease may be identified through evidence of growth or worsening of disease at previously known sites of disease and/or of the occurrence of new sites of metastatic disease.   2. Findings concerning for progression of disease include:    A. Worsening  symptoms such as pain or dyspnea    B. Evidence of worsening or new disease on physical examination.    C. Declining performance status    D. Unexplained weight loss    E. Increasing Alkaline phosphatase, ALT, AST, or bilirubin    F. Hypercalcemia    G. New radiographic abnormality or increase in the size of pre-existing radiographic abnormality.    H. New areas of abnormality on functional imaging (eg, bone scan, PET/CT scan)    I. Increasing tumor markers (eg, CEA, CA 15-3, CA27.29)   All questions were answered. The patient knows to call the clinic with any problems, questions or concerns. We can certainly see the patient much sooner if necessary.  Patient and plan discussed with Dr. Ancil Linsey and she is in agreement with the aforementioned.   This note is electronically signed by: [name] 09/08/2014  4:04 PM

## 2014-09-08 NOTE — Progress Notes (Signed)
Brittany Blair presented for [employer]. Labs per MD order drawn via Peripheral Line 23 gauge needle inserted in right hand  Good blood return present. Procedure without incident.  Needle removed intact. Patient tolerated procedure well.

## 2014-09-08 NOTE — Assessment & Plan Note (Addendum)
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  Tolerating well thus far.  On Xgeva therapy for bone metastases with Ca++ and Vit D to prevent hypocalcemia associated with Xgeva.    Labs today: CBC diff, CMET, CA 27.29, and CA 15-3.    Continue treatment as planned.    Restaging CT CAP with contrast due to new pains.  Bone scan also ordered checking for new or progressive osseous disease.  Return following scans.

## 2014-09-08 NOTE — Patient Instructions (Signed)
[place] at [facility] Discharge Instructions  RECOMMENDATIONS MADE BY THE CONSULTANT AND ANY TEST RESULTS WILL BE SENT TO YOUR REFERRING PHYSICIAN.  Exam and discussion by [name], PA-C Will repeat scans to see if there are any changes. Refills for Morphine and Oxycodone If labs ok we will give you the xgeva today.  Follow-up with Dr. Whitney Muse after scans.  Thank you for choosing [place] at [facility] to provide your oncology and hematology care.  To afford each patient quality time with our provider, please arrive at least 15 minutes before your scheduled appointment time.    You need to re-schedule your appointment should you arrive 10 or more minutes late.  We strive to give you quality time with our providers, and arriving late affects you and other patients whose appointments are after yours.  Also, if you no show three or more times for appointments you may be dismissed from the clinic at the providers discretion.     Again, thank you for choosing [facility].  Our hope is that these requests will decrease the amount of time that you wait before being seen by our physicians.       _____________________________________________________________  Should you have questions after your visit to [facility], please contact our office at [phone] between the hours of 8:30 a.m. and 4:30 p.m.  Voicemails left after 4:30 p.m. will not be returned until the following business day.  For prescription refill requests, have your pharmacy contact our office.

## 2014-09-09 ENCOUNTER — Other Ambulatory Visit (HOSPITAL_COMMUNITY): Payer: Self-pay

## 2014-09-09 ENCOUNTER — Telehealth (HOSPITAL_COMMUNITY): Payer: Self-pay

## 2014-09-09 LAB — CANCER ANTIGEN 15-3: Cancer Antigen-Breast 15-3: 24 U/mL (ref <32)

## 2014-09-09 LAB — CANCER ANTIGEN 27.29: CA 27.29: 30.8 U/mL (ref 0.0–38.6)

## 2014-09-09 NOTE — Telephone Encounter (Signed)
A user error has taken place: encounter opened in error, closed for administrative reasons.

## 2014-09-12 NOTE — Progress Notes (Signed)
This encounter was created in error - please disregard.

## 2014-09-16 ENCOUNTER — Ambulatory Visit (HOSPITAL_COMMUNITY)
Admission: RE | Admit: 2014-09-16 | Discharge: 2014-09-16 | Disposition: A | Discharge status: Home / Self Care | Payer: Self-pay | Source: Ambulatory Visit | Attending: Oncology | Admitting: Oncology

## 2014-09-16 ENCOUNTER — Encounter (HOSPITAL_COMMUNITY): Payer: Self-pay

## 2014-09-16 DIAGNOSIS — C50919 Malignant neoplasm of unspecified site of unspecified female breast: Secondary | ICD-10-CM | POA: Insufficient documentation

## 2014-09-16 DIAGNOSIS — C7951 Secondary malignant neoplasm of bone: Secondary | ICD-10-CM | POA: Insufficient documentation

## 2014-09-16 MED ORDER — IOHEXOL 300 MG/ML  SOLN
100.0000 mL | Freq: Once | INTRAMUSCULAR | Status: AC | PRN
  Administered 2014-09-16: 100 mL via INTRAVENOUS

## 2014-09-20 ENCOUNTER — Encounter (HOSPITAL_COMMUNITY): Payer: Self-pay

## 2014-09-20 ENCOUNTER — Ambulatory Visit (HOSPITAL_COMMUNITY): Payer: Self-pay

## 2014-09-22 ENCOUNTER — Encounter (HOSPITAL_COMMUNITY)
Admission: RE | Admit: 2014-09-22 | Discharge: 2014-09-22 | Disposition: A | Discharge status: Home / Self Care | Payer: Self-pay | Source: Ambulatory Visit | Attending: Oncology | Admitting: Oncology

## 2014-09-22 ENCOUNTER — Encounter (HOSPITAL_COMMUNITY): Payer: Self-pay

## 2014-09-22 ENCOUNTER — Other Ambulatory Visit (HOSPITAL_COMMUNITY): Payer: Self-pay | Admitting: Oncology

## 2014-09-22 DIAGNOSIS — C50919 Malignant neoplasm of unspecified site of unspecified female breast: Secondary | ICD-10-CM | POA: Insufficient documentation

## 2014-09-22 DIAGNOSIS — C7951 Secondary malignant neoplasm of bone: Secondary | ICD-10-CM | POA: Insufficient documentation

## 2014-09-22 MED ORDER — TECHNETIUM TC 99M MEDRONATE IV KIT
25.0000 | PACK | Freq: Once | INTRAVENOUS | Status: AC | PRN
  Administered 2014-09-22: 27 via INTRAVENOUS

## 2014-09-28 ENCOUNTER — Other Ambulatory Visit (HOSPITAL_COMMUNITY): Payer: Self-pay

## 2014-09-28 ENCOUNTER — Ambulatory Visit (HOSPITAL_COMMUNITY): Payer: Self-pay | Admitting: Hematology & Oncology

## 2014-09-28 DIAGNOSIS — C7951 Secondary malignant neoplasm of bone: Principal | ICD-10-CM

## 2014-09-28 DIAGNOSIS — C50912 Malignant neoplasm of unspecified site of left female breast: Secondary | ICD-10-CM

## 2014-09-28 DIAGNOSIS — C50919 Malignant neoplasm of unspecified site of unspecified female breast: Secondary | ICD-10-CM

## 2014-09-30 ENCOUNTER — Ambulatory Visit (HOSPITAL_COMMUNITY): Payer: Self-pay

## 2014-09-30 ENCOUNTER — Other Ambulatory Visit (HOSPITAL_COMMUNITY): Payer: Self-pay

## 2014-09-30 ENCOUNTER — Ambulatory Visit (HOSPITAL_COMMUNITY): Payer: Self-pay | Admitting: Hematology & Oncology

## 2014-10-03 ENCOUNTER — Other Ambulatory Visit (HOSPITAL_COMMUNITY): Payer: Self-pay | Admitting: Oncology

## 2014-10-07 ENCOUNTER — Encounter (HOSPITAL_COMMUNITY): Payer: Self-pay | Attending: Oncology

## 2014-10-07 ENCOUNTER — Encounter (HOSPITAL_BASED_OUTPATIENT_CLINIC_OR_DEPARTMENT_OTHER): Payer: Self-pay | Admitting: Oncology

## 2014-10-07 ENCOUNTER — Encounter (HOSPITAL_COMMUNITY): Payer: Self-pay

## 2014-10-07 VITALS — BP 109/77 | HR 87 | Temp 99.1°F | Resp 20 | Wt 217.5 lb

## 2014-10-07 DIAGNOSIS — C7951 Secondary malignant neoplasm of bone: Secondary | ICD-10-CM | POA: Insufficient documentation

## 2014-10-07 DIAGNOSIS — C50919 Malignant neoplasm of unspecified site of unspecified female breast: Secondary | ICD-10-CM

## 2014-10-07 DIAGNOSIS — R978 Other abnormal tumor markers: Secondary | ICD-10-CM | POA: Insufficient documentation

## 2014-10-07 DIAGNOSIS — R209 Unspecified disturbances of skin sensation: Secondary | ICD-10-CM

## 2014-10-07 DIAGNOSIS — C50912 Malignant neoplasm of unspecified site of left female breast: Secondary | ICD-10-CM

## 2014-10-07 DIAGNOSIS — C50312 Malignant neoplasm of lower-inner quadrant of left female breast: Secondary | ICD-10-CM | POA: Insufficient documentation

## 2014-10-07 DIAGNOSIS — R938 Abnormal findings on diagnostic imaging of other specified body structures: Secondary | ICD-10-CM | POA: Insufficient documentation

## 2014-10-07 DIAGNOSIS — L539 Erythematous condition, unspecified: Secondary | ICD-10-CM

## 2014-10-07 DIAGNOSIS — F411 Generalized anxiety disorder: Secondary | ICD-10-CM | POA: Insufficient documentation

## 2014-10-07 DIAGNOSIS — R609 Edema, unspecified: Secondary | ICD-10-CM

## 2014-10-07 LAB — COMPREHENSIVE METABOLIC PANEL
ALBUMIN: 3.8 g/dL (ref 3.5–5.0)
ALT: 32 U/L (ref 14–54)
ANION GAP: 9 (ref 5–15)
AST: 37 U/L (ref 15–41)
Alkaline Phosphatase: 78 U/L (ref 38–126)
BILIRUBIN TOTAL: 0.4 mg/dL (ref 0.3–1.2)
BUN: 9 mg/dL (ref 6–20)
CHLORIDE: 96 mmol/L — AB (ref 101–111)
CO2: 26 mmol/L (ref 22–32)
Calcium: 9.1 mg/dL (ref 8.9–10.3)
Creatinine, Ser: 0.77 mg/dL (ref 0.44–1.00)
GFR calc Af Amer: >60 mL/min (ref >60)
GFR calc non Af Amer: >60 mL/min (ref >60)
Glucose, Bld: 89 mg/dL (ref 65–99)
POTASSIUM: 4.6 mmol/L (ref 3.5–5.1)
Sodium: 131 mmol/L — ABNORMAL LOW (ref 135–145)
Total Protein: 7.3 g/dL (ref 6.5–8.1)

## 2014-10-07 LAB — CBC WITH DIFFERENTIAL/PLATELET
BASOS PCT: 1 % (ref 0–1)
Basophils Absolute: 0.1 10*3/uL (ref 0.0–0.1)
EOS ABS: 0.1 10*3/uL (ref 0.0–0.7)
EOS PCT: 2 % (ref 0–5)
HEMATOCRIT: 35.8 % — AB (ref 36.0–46.0)
Hemoglobin: 12.3 g/dL (ref 12.0–15.0)
Lymphocytes Relative: 21 % (ref 12–46)
Lymphs Abs: 1 10*3/uL (ref 0.7–4.0)
MCH: 38.4 pg — AB (ref 26.0–34.0)
MCHC: 34.4 g/dL (ref 30.0–36.0)
MCV: 111.9 fL — ABNORMAL HIGH (ref 78.0–100.0)
MONO ABS: 0.5 10*3/uL (ref 0.1–1.0)
Monocytes Relative: 10 % (ref 3–12)
Neutro Abs: 3.1 10*3/uL (ref 1.7–7.7)
Neutrophils Relative %: 65 % (ref 43–77)
Platelets: 290 10*3/uL (ref 150–400)
RBC: 3.2 MIL/uL — ABNORMAL LOW (ref 3.87–5.11)
RDW: 13.7 % (ref 11.5–15.5)
WBC: 4.7 10*3/uL (ref 4.0–10.5)

## 2014-10-07 MED ORDER — DENOSUMAB 120 MG/1.7ML ~~LOC~~ SOLN
120.0000 mg | Freq: Once | SUBCUTANEOUS | Status: AC
  Administered 2014-10-07: 120 mg via SUBCUTANEOUS
  Filled 2014-10-07: qty 1.7

## 2014-10-07 MED ORDER — SULFAMETHOXAZOLE-TRIMETHOPRIM 800-160 MG PO TABS: 1.0000 | ORAL_TABLET | Freq: Two times a day (BID) | ORAL | Status: DC

## 2014-10-07 NOTE — Patient Instructions (Signed)
[place] at [facility] Discharge Instructions  RECOMMENDATIONS MADE BY THE CONSULTANT AND ANY TEST RESULTS WILL BE SENT TO YOUR REFERRING PHYSICIAN.  We are giving you an antibiotic and you are to keep [place] informed regarding the swelling in the left arm and the eye drainage.  Scans are good  Antibiotic being sent into [place]  Return in 4 weeks with labs, XGEVA, and MD visit  Thank you for choosing [place] at [facility] to provide your oncology and hematology care.  To afford each patient quality time with our provider, please arrive at least 15 minutes before your scheduled appointment time.    You need to re-schedule your appointment should you arrive 10 or more minutes late.  We strive to give you quality time with our providers, and arriving late affects you and other patients whose appointments are after yours.  Also, if you no show three or more times for appointments you may be dismissed from the clinic at the providers discretion.     Again, thank you for choosing [facility] [place].  Our hope is that these requests will decrease the amount of time that you wait before being seen by our physicians.       _____________________________________________________________  Should you have questions after your visit to [facility], please contact our office at [phone] between the hours of 8:30 a.m. and 4:30 p.m.  Voicemails left after 4:30 p.m. will not be returned until the following business day.  For prescription refill requests, have your pharmacy contact our office.

## 2014-10-07 NOTE — Progress Notes (Signed)
Pt c/o having worsening lymphedema in lt arm. "The arm is hard like a rock". "It feels like it has an embedded golf ball in it".

## 2014-10-07 NOTE — Addendum Note (Signed)
Addended by: Gerhard Perches on: 10/07/2014 01:38 PM   Modules accepted: Orders

## 2014-10-07 NOTE — Assessment & Plan Note (Addendum)
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  Tolerating well thus far.  On Xgeva therapy for bone metastases with Ca++ and Vit D to prevent hypocalcemia associated with Xgeva.    Labs today: CBC diff, CMET, CA 27.29, and CA 15-3.    Continue treatment as planned.    Erythema, edema, and heat with tenderness noted on exam of left upper extremity with a low grade temperature of 99.1 F.  Suspicion of early cellulitis is of concern.  Additionally, patient reports green/yellow discharge from eyes (not noted on exam today).  I will escribe Bactrim DS x 10 days.  I reviewed side effects of Ibrance and I do not see that this is Ibrance related, other than the increased risk of infection.  She is to report to ED with progressive cellulitis or progressive symptoms.  Return in 4 weeks for follow-up.

## 2014-10-07 NOTE — Progress Notes (Signed)
Brittany Blair presents today for injection per MD orders. XGEVA 120 administered SQ in right Upper Arm. Administration without incident. Patient tolerated well.

## 2014-10-07 NOTE — Progress Notes (Signed)
No PCP Per Patient No address on file  Carcinoma of breast metastatic to bone, unspecified laterality - Plan: sulfamethoxazole-trimethoprim (BACTRIM DS,SEPTRA DS) 800-160 MG per tablet  CURRENT THERAPY: Ibrance 100 mg daily 3/1 fashion plus letrozole daily. Xgeva beginning on 01/17/2014.   INTERVAL HISTORY: Brittany Blair 54 y.o. female returns for followup of Metastatic breast cancer to bone after undergoing an iliac bone biopsy on 12/08/2013 demonstrating an ER/PR +, Her2 negative breast cancer. She was started on Ibrance plus letrozole will be given with ibrance 100 mg daily for 3 weeks out of every 4 and letrozole 2.5 mg daily.     Carcinoma of breast metastatic to bone   12/08/2013 Progression Iliac bone biopsy consistent with adenocarcinoma, ER/PR positive, HER-2/neu not overexpressed   12/23/2013 -  Chemotherapy Ibrance 100 mg daily for 3 weeks out of every 4 and letrozole 2.5 mg daily.   01/17/2014 Miscellaneous Xgeva 120 mg SQ every 28 days for bone metastases.   09/16/2014 Imaging Stable exam. Osseous metastatic disease is seen on the mostbrecent comparison study without substantial interval change.  No evidence for soft tissue metastases in the chest, abdomen, or pelvis.   09/22/2014 Imaging Bone scan- Stable abnormal uptake the medial aspect of the left clavicle, lateral aspects of 2 mid left ribs, and in the medial aspect of the left iliac bone. Slightly increased conspicuity of activity in the left inferior pubic ramus with new increase...    I personally reviewed and went over radiographic studies with the patient.  The results are noted within this dictation.   CT imaging is negative for frank progression of disease.  Stability of disease is documented.  i provided her education regarding her scans and the meaning of stable disease.  I explained why a PET scan is not helpful in her situation at this time.    She notes increased pain in left arm with increased  edema.  Exam is impressive for erythema and heat.  She notes some green/yellow eye discharge.  Her temperature today is 99.8F.   Past Medical History  Diagnosis Date  . Anxiety   . Herpes   . MRSA (methicillin resistant Staphylococcus aureus)   . Breast lump     left  . Hypertension   . Hematoma   . Swelling     left foot  . Multiple blisters     along surgical site  . Depression   . Genital herpes 08/04/2012  . PMB (postmenopausal bleeding) 06/23/2013  . Lymph edema   . Vaginal delivery 1981, 1984, 1987, 1992  . Breast cancer dx'd 05/27/2010    chemo/xrt comp 01/2011  . Carcinoma of breast metastatic to bone 05/28/2010    This patient presented in April with an invasive ductal carcinoma that had broken through the skin and was contaminated with MRSA. She was stage III. It was lower inner quadrant. It was receptor positive, HER-2/neu negative, and had a KI-67 of 75%.  She was treated with neoadjuvant chemotherapy. She was recently hospitalized (June, 2012,) with multiple superficial MRSA infections. These have all r    has Carcinoma of breast metastatic to bone; Syncope; GAD (generalized anxiety disorder); HTN (hypertension), benign; Chronic chest pain; Depression; Transaminitis; Genital herpes; PMB (postmenopausal bleeding); Acute blood loss anemia; Abdominal pain; AKI (acute kidney injury); GIB (gastrointestinal bleeding); Syncope and collapse; Prepyloric ulcer; Left sided numbness; Hypotension; Hypocalcemia; Infiltrating ductal carcinoma of left breast; and Cancer related pain on her problem list.  is allergic to bee venom.  Current Outpatient Prescriptions on File Prior to Visit  Medication Sig Dispense Refill  . acyclovir (ZOVIRAX) 400 MG tablet Take 1 tablet (400 mg total) by mouth 2 (two) times daily. 60 tablet 6  . ALPRAZolam (XANAX) 1 MG tablet Take 1 tablet (1 mg total) by mouth 3 (three) times daily. 90 tablet 5  . amLODipine (NORVASC) 5 MG tablet Take 1 tablet (5 mg total)  by mouth daily. 180 tablet 2  . calcium-vitamin D (OSCAL WITH D) 500-200 MG-UNIT per tablet Take 2 tablets by mouth daily with breakfast. 60 tablet 11  . clotrimazole (LOTRIMIN) 1 % cream Apply 1 application topically 2 (two) times daily as needed (yeast rash).    Marland Kitchen letrozole (FEMARA) 2.5 MG tablet Take 1 tablet (2.5 mg total) by mouth daily. 90 tablet 3  . lisinopril (PRINIVIL,ZESTRIL) 40 MG tablet TAKE 1 TABLET BY MOUTH ONCE DAILY 30 tablet 3  . loratadine (CLARITIN) 10 MG tablet Take 10 mg by mouth daily as needed for allergies.     . metoprolol succinate (TOPROL-XL) 25 MG 24 hr tablet TAKE 3 TABLETS BY MOUTH ONCE DAILY WITH OR IMMEDIATELY FOLLOWING A MEAL 90 tablet 2  . morphine (MS CONTIN) 60 MG 12 hr tablet Take 1 tablet (60 mg total) by mouth every 8 (eight) hours. 90 tablet 0  . Oxycodone HCl 10 MG TABS Take 1 or 2 tablets every 4 hours to control pain. 120 tablet 0  . palbociclib (IBRANCE) 100 MG capsule Take 1 capsule daily with food for 21 days, rest 7 days, then resume taking the medicine 3 weeks on, 1 week off. 21 capsule 6  . polyethylene glycol (MIRALAX / GLYCOLAX) packet Take 8.5 g by mouth daily as needed for moderate constipation.    . pregabalin (LYRICA) 75 MG capsule Take 2 capsules (150 mg total) by mouth daily. 60 capsule 3  . promethazine (PHENERGAN) 25 MG tablet Take 1 tablet (25 mg total) by mouth every 6 (six) hours as needed for nausea or vomiting. 30 tablet 6  . tetrahydrozoline-zinc (VISINE-AC) 0.05-0.25 % ophthalmic solution Place 2 drops into both eyes 3 (three) times daily as needed (for allergies).     . traZODone (DESYREL) 50 MG tablet TAKE 1 TABLET BY MOUTH AT BEDTIME. 30 tablet 6  . venlafaxine XR (EFFEXOR-XR) 75 MG 24 hr capsule TAKE 3 CAPSULES BY MOUTH DAILY 90 capsule 2  . [DISCONTINUED] FLUoxetine (PROZAC) 20 MG tablet Take 40 mg by mouth daily.       No current facility-administered medications on file prior to visit.    Past Surgical History  Procedure  Laterality Date  . Tubal ligation    . Wisdom tooth extraction    . Mastectomy modified radical  10/10/10     left -Dr Margot Chimes  . Laparoscopic assisted vaginal hysterectomy Bilateral 10/14/2013    Procedure: LAPAROSCOPIC ASSISTED VAGINAL HYSTERECTOMY;  Surgeon: Ena Dawley, MD;  Location: [facility];  Service: Gynecology;  Laterality: Bilateral;  . Cystoscopy N/A 10/14/2013    Procedure: CYSTOSCOPY;  Surgeon: Ena Dawley, MD;  Location: [facility];  Service: Gynecology;  Laterality: N/A;  . Laparoscopic lysis of adhesions N/A 10/14/2013    Procedure: LAPAROSCOPIC LYSIS OF ADHESIONS;  Surgeon: Ena Dawley, MD;  Location: [facility];  Service: Gynecology;  Laterality: N/A;  . Laparoscopic bilateral salpingo oopherectomy Bilateral 10/14/2013    Procedure: LAPAROSCOPIC BILATERAL SALPINGO OOPHORECTOMY;  Surgeon: Ena Dawley, MD;  Location: [facility];  Service: Gynecology;  Laterality:  Bilateral;  . Esophagogastroduodenoscopy N/A 10/27/2013    Procedure: ESOPHAGOGASTRODUODENOSCOPY (EGD);  Surgeon: Wonda Horner, MD;  Location: [facility];  Service: Endoscopy;  Laterality: N/A;  . Bone biopsy Left 11/2013    left post. hip    Denies any headaches, dizziness, double vision, fevers, chills, night sweats, nausea, vomiting, diarrhea, constipation, chest pain, heart palpitations, shortness of breath, blood in stool, black tarry stool, urinary pain, urinary burning, urinary frequency, hematuria.   PHYSICAL EXAMINATION  ECOG PERFORMANCE STATUS: 2 - Symptomatic, <50% confined to bed  Filed Vitals:   10/07/14 1151  BP: 109/77  Pulse: 87  Temp: 99.1 F (37.3 C)  Resp: 20    GENERAL:alert, well nourished, well developed, comfortable, cooperative, obese, smiling and not particularly clean SKIN: skin color, texture, turgor are normal, no rashes or significant lesions HEAD: Normocephalic, No masses, lesions, tenderness or abnormalities EYES: normal, PERRLA, EOMI, Conjunctiva are pink and non-injected, no  discharge noted. EARS: External ears normal OROPHARYNX:lips, buccal mucosa, and tongue normal, mucous membranes are moist and erythema around lower lip   NECK: supple, thyroid normal size, non-tender, without nodularity, trachea midline LYMPH:  no palpable lymphadenopathy, no hepatosplenomegaly BREAST:not examined LUNGS: clear to auscultation  HEART: regular rate & rhythm, no murmurs and no gallops ABDOMEN:abdomen soft, non-tender, obese and normal bowel sounds BACK: Back symmetric, no curvature. EXTREMITIES:positive findings:  Left UE with lymphedema, hot to the touch and increased erythema and tenderness.  NEURO: alert & oriented x 3 with fluent speech, no focal motor/sensory deficits, in a wheelchair.   LABORATORY DATA: CBC    Component Value Date/Time   WBC 4.3 09/08/2014 1335   WBC 7.1 01/24/2012 1539   RBC 2.87* 09/08/2014 1335   RBC 3.00* 10/27/2013 0800   RBC 4.39 01/24/2012 1539   HGB 11.2* 09/08/2014 1335   HGB 14.7 01/24/2012 1539   HCT 32.7* 09/08/2014 1335   HCT 43.7 01/24/2012 1539   PLT 271 09/08/2014 1335   PLT 258 01/24/2012 1539   MCV 113.9* 09/08/2014 1335   MCV 99.5 01/24/2012 1539   MCH 39.0* 09/08/2014 1335   MCH 33.5 01/24/2012 1539   MCHC 34.3 09/08/2014 1335   MCHC 33.6 01/24/2012 1539   RDW 14.7 09/08/2014 1335   RDW 14.1 01/24/2012 1539   LYMPHSABS 0.8 09/08/2014 1335   LYMPHSABS 1.4 01/24/2012 1539   MONOABS 0.4 09/08/2014 1335   MONOABS 0.6 01/24/2012 1539   EOSABS 0.1 09/08/2014 1335   EOSABS 0.3 01/24/2012 1539   BASOSABS 0.0 09/08/2014 1335   BASOSABS 0.0 01/24/2012 1539      Chemistry      Component Value Date/Time   NA 137 09/08/2014 1335   K 4.2 09/08/2014 1335   CL 104 09/08/2014 1335   CO2 23 09/08/2014 1335   BUN 10 09/08/2014 1335   CREATININE 0.82 09/08/2014 1335      Component Value Date/Time   CALCIUM 8.7* 09/08/2014 1335   ALKPHOS 84 09/08/2014 1335   AST 38 09/08/2014 1335   ALT 34 09/08/2014 1335   BILITOT 0.4  09/08/2014 1335     Lab Results  Component Value Date   LABCA2 30.8 09/08/2014    PENDING LABS:   RADIOGRAPHIC STUDIES:  Ct Chest W Contrast  09/16/2014   CLINICAL DATA:  Subsequent encounter for metastatic breast cancer with left hip and bilateral knee pain for 1 month.  EXAM: CT CHEST, ABDOMEN, AND PELVIS WITH CONTRAST  TECHNIQUE: Multidetector CT imaging of the chest, abdomen and pelvis was performed  following the standard protocol during bolus administration of intravenous contrast.  CONTRAST:  170m OMNIPAQUE IOHEXOL 300 MG/ML  SOLN  COMPARISON:  03/21/2014.  FINDINGS: CT CHEST FINDINGS  Mediastinum/Nodes: No axillary lymphadenopathy. No mediastinal or hilar lymphadenopathy. Heart size is normal. No pericardial effusion. Esophagus has normal CT imaging features.  Lungs/Pleura: Ground-glass attenuation in the left apex is stable. Interstitial coarsening anteriorly in the left lung is also unchanged. No new pulmonary nodule or mass. No focal airspace consolidation. No pulmonary edema or pleural effusion.  Musculoskeletal: Nonacute left-sided rib fractures again noted. Sclerotic focus in the right sternum (image 23 series 6) is more prominent than before. There is some irregularity in the left clavicular head, new since 11/30/2013. Patient is status post left mastectomy. Edema within the visualized portion of left upper extremity may be related to lymphedema from previous surgery.  CT ABDOMEN AND PELVIS FINDINGS  Hepatobiliary: No focal abnormality within the liver parenchyma. There is no evidence for gallstones, gallbladder wall thickening, or pericholecystic fluid. No intrahepatic or extrahepatic biliary dilation.  Pancreas: No focal mass lesion. No dilatation of the main duct. No intraparenchymal cyst. No peripancreatic edema.  Spleen: No splenomegaly. No focal mass lesion.  Adrenals/Urinary Tract: No adrenal nodule or mass. Kidneys are unremarkable. No evidence for hydroureteronephrosis. Urinary  bladder is normal in appearance.  Stomach/Bowel: Stomach is nondistended. No gastric wall thickening. No evidence of outlet obstruction. Duodenum is normally positioned as is the ligament of Treitz. No small bowel wall thickening. No small bowel dilatation. Terminal ileum normal. Appendix normal. No gross colonic mass. No colonic wall thickening. No substantial diverticular change.  Vascular/Lymphatic: There is abdominal aortic atherosclerosis without aneurysm. Portal vein is patent. No lymphadenopathy in the abdomen. No pelvic sidewall lymphadenopathy.  Reproductive: Uterus is surgically absent.  No adnexal mass.  Other: No intraperitoneal free fluid.  Musculoskeletal: Destructive lesion in the left pubic bone and inferior pubic ramus is not substantially changed in the interval. There is a destructive lesion in the left iliac crest, also not substantially changed. Small sclerotic focus in the right iliac bone is stable.  IMPRESSION: 1. Stable exam. Osseous metastatic disease is seen on the most recent comparison study without substantial interval change. 2. No evidence for soft tissue metastases in the chest, abdomen, or pelvis. 3. Abdominal aortic atherosclerosis.   Electronically Signed   By: EMisty StanleyM.D.   On: 09/16/2014 13:40   Nm Bone Scan Whole Body  09/22/2014   CLINICAL DATA:  History of breast malignancy, several month history of pain in both knees and in the left leg, pelvic pain since 2015  EXAM: NUCLEAR MEDICINE WHOLE BODY BONE SCAN  TECHNIQUE: Whole body anterior and posterior images were obtained approximately 3 hours after intravenous injection of radiopharmaceutical.  RADIOPHARMACEUTICALS:  27 mCi mCi Technetium-942mDP IV  COMPARISON:  Nuclear bone scan of April 01, 2014 and November 10, 2013.  FINDINGS: There is adequate uptake of the radiopharmaceutical by the skeleton. Adequate soft tissue clearance and renal activity is observed.  There is persistent increased uptake in the medial  aspect of the left clavicle. Stable mildly increased uptake in the lateral aspects of approximately the fourth and fifth ribs laterally is noted. There is no abnormal uptake associated with the shoulders or calvarium. Activity within the spine is normal with exception of a tiny focus at L5 on the right.  There is persistently increased uptake in the medial aspect of the left iliac bone and in the left inferior pubic ramus.  Increased uptake in the parasymphyseal portion of the left superior pubic ramus is noted today.  There is no abnormal uptake in the lower extremities suspicious for malignancy.  IMPRESSION: 1. Stable abnormal uptake the medial aspect of the left clavicle, lateral aspects of 2 mid left ribs, and in the medial aspect of the left iliac bone. Slightly increased conspicuity of activity in the left inferior pubic ramus with new increased activity in the parasymphyseal portion of the left superior pubic ramus. 2. New tiny focus of increased uptake quite laterally at L5-S1 is likely degenerative. Plain films of the lumbar spine and of the pelvis are recommended.   Electronically Signed   By: David  Martinique M.D.   On: 09/22/2014 15:23   Ct Abdomen Pelvis W Contrast  09/16/2014   CLINICAL DATA:  Subsequent encounter for metastatic breast cancer with left hip and bilateral knee pain for 1 month.  EXAM: CT CHEST, ABDOMEN, AND PELVIS WITH CONTRAST  TECHNIQUE: Multidetector CT imaging of the chest, abdomen and pelvis was performed following the standard protocol during bolus administration of intravenous contrast.  CONTRAST:  139m OMNIPAQUE IOHEXOL 300 MG/ML  SOLN  COMPARISON:  03/21/2014.  FINDINGS: CT CHEST FINDINGS  Mediastinum/Nodes: No axillary lymphadenopathy. No mediastinal or hilar lymphadenopathy. Heart size is normal. No pericardial effusion. Esophagus has normal CT imaging features.  Lungs/Pleura: Ground-glass attenuation in the left apex is stable. Interstitial coarsening anteriorly in the left  lung is also unchanged. No new pulmonary nodule or mass. No focal airspace consolidation. No pulmonary edema or pleural effusion.  Musculoskeletal: Nonacute left-sided rib fractures again noted. Sclerotic focus in the right sternum (image 23 series 6) is more prominent than before. There is some irregularity in the left clavicular head, new since 11/30/2013. Patient is status post left mastectomy. Edema within the visualized portion of left upper extremity may be related to lymphedema from previous surgery.  CT ABDOMEN AND PELVIS FINDINGS  Hepatobiliary: No focal abnormality within the liver parenchyma. There is no evidence for gallstones, gallbladder wall thickening, or pericholecystic fluid. No intrahepatic or extrahepatic biliary dilation.  Pancreas: No focal mass lesion. No dilatation of the main duct. No intraparenchymal cyst. No peripancreatic edema.  Spleen: No splenomegaly. No focal mass lesion.  Adrenals/Urinary Tract: No adrenal nodule or mass. Kidneys are unremarkable. No evidence for hydroureteronephrosis. Urinary bladder is normal in appearance.  Stomach/Bowel: Stomach is nondistended. No gastric wall thickening. No evidence of outlet obstruction. Duodenum is normally positioned as is the ligament of Treitz. No small bowel wall thickening. No small bowel dilatation. Terminal ileum normal. Appendix normal. No gross colonic mass. No colonic wall thickening. No substantial diverticular change.  Vascular/Lymphatic: There is abdominal aortic atherosclerosis without aneurysm. Portal vein is patent. No lymphadenopathy in the abdomen. No pelvic sidewall lymphadenopathy.  Reproductive: Uterus is surgically absent.  No adnexal mass.  Other: No intraperitoneal free fluid.  Musculoskeletal: Destructive lesion in the left pubic bone and inferior pubic ramus is not substantially changed in the interval. There is a destructive lesion in the left iliac crest, also not substantially changed. Small sclerotic focus in the  right iliac bone is stable.  IMPRESSION: 1. Stable exam. Osseous metastatic disease is seen on the most recent comparison study without substantial interval change. 2. No evidence for soft tissue metastases in the chest, abdomen, or pelvis. 3. Abdominal aortic atherosclerosis.   Electronically Signed   By: EMisty StanleyM.D.   On: 09/16/2014 13:40     PATHOLOGY:    ASSESSMENT AND  PLAN:  Carcinoma of breast metastatic to bone ER/PR+, Her2 negative malignancy, on Letrozole and Ibrance 100 mg daily 3 weeks on and 1 week off every 4 weeks.  Tolerating well thus far.  On Xgeva therapy for bone metastases with Ca++ and Vit D to prevent hypocalcemia associated with Xgeva.    Labs today: CBC diff, CMET, CA 27.29, and CA 15-3.    Continue treatment as planned.    Erythema, edema, and heat with tenderness noted on exam of left upper extremity with a low grade temperature of 99.1 F.  Suspicion of early cellulitis is of concern.  Additionally, patient reports green/yellow discharge from eyes (not noted on exam today).  I will escribe Bactrim DS x 10 days.  I reviewed side effects of Ibrance and I do not see that this is Ibrance related, other than the increased risk of infection.  She is to report to ED with progressive cellulitis or progressive symptoms.  Return in 4 weeks for follow-up.   THERAPY PLAN:  Continue with treatment as planned.  All questions were answered. The patient knows to call the clinic with any problems, questions or concerns. We can certainly see the patient much sooner if necessary.  Patient and plan discussed with Dr. Ancil Linsey and she is in agreement with the aforementioned.   This note is electronically signed by: Doy Mince 10/07/2014 12:33 PM

## 2014-10-08 LAB — CANCER ANTIGEN 27.29: CA 27.29: 34.9 U/mL (ref 0.0–38.6)

## 2014-10-08 LAB — CANCER ANTIGEN 15-3: CANCER ANTIGEN-BREAST 15-3: 28 U/mL (ref <32)

## 2014-10-20 ENCOUNTER — Other Ambulatory Visit (HOSPITAL_COMMUNITY): Payer: Self-pay | Admitting: Hematology & Oncology

## 2014-10-20 ENCOUNTER — Other Ambulatory Visit (HOSPITAL_COMMUNITY): Payer: Self-pay | Admitting: Oncology

## 2014-10-20 DIAGNOSIS — G893 Neoplasm related pain (acute) (chronic): Secondary | ICD-10-CM

## 2014-10-20 DIAGNOSIS — C50919 Malignant neoplasm of unspecified site of unspecified female breast: Secondary | ICD-10-CM

## 2014-10-20 DIAGNOSIS — C7951 Secondary malignant neoplasm of bone: Principal | ICD-10-CM

## 2014-10-20 MED ORDER — MORPHINE SULFATE ER 60 MG PO TBCR: 60.0000 mg | EXTENDED_RELEASE_TABLET | Freq: Three times a day (TID) | ORAL | Status: DC

## 2014-10-20 MED ORDER — OXYCODONE HCL 10 MG PO TABS: ORAL_TABLET | ORAL | Status: DC

## 2014-10-24 ENCOUNTER — Other Ambulatory Visit (HOSPITAL_COMMUNITY): Payer: Self-pay | Admitting: Oncology

## 2014-10-24 DIAGNOSIS — F411 Generalized anxiety disorder: Secondary | ICD-10-CM

## 2014-10-24 MED ORDER — ALPRAZOLAM 1 MG PO TABS: 1.0000 mg | ORAL_TABLET | Freq: Three times a day (TID) | ORAL | Status: DC | PRN

## 2014-10-27 ENCOUNTER — Other Ambulatory Visit (HOSPITAL_COMMUNITY): Payer: Self-pay | Admitting: Oncology

## 2014-10-27 DIAGNOSIS — C7951 Secondary malignant neoplasm of bone: Principal | ICD-10-CM

## 2014-10-27 DIAGNOSIS — C50919 Malignant neoplasm of unspecified site of unspecified female breast: Secondary | ICD-10-CM

## 2014-10-27 MED ORDER — SULFAMETHOXAZOLE-TRIMETHOPRIM 800-160 MG PO TABS: 1.0000 | ORAL_TABLET | Freq: Two times a day (BID) | ORAL | Status: DC

## 2014-10-28 ENCOUNTER — Other Ambulatory Visit (HOSPITAL_COMMUNITY): Payer: Self-pay | Admitting: Oncology

## 2014-11-03 ENCOUNTER — Other Ambulatory Visit (HOSPITAL_COMMUNITY): Payer: Self-pay | Admitting: Oncology

## 2014-11-03 DIAGNOSIS — C7951 Secondary malignant neoplasm of bone: Principal | ICD-10-CM

## 2014-11-03 DIAGNOSIS — C50919 Malignant neoplasm of unspecified site of unspecified female breast: Secondary | ICD-10-CM

## 2014-11-03 MED ORDER — OXYCODONE HCL 10 MG PO TABS
ORAL_TABLET | ORAL | Status: DC
Start: 2014-11-03 — End: 2014-11-18

## 2014-11-04 ENCOUNTER — Other Ambulatory Visit (HOSPITAL_COMMUNITY): Payer: Self-pay

## 2014-11-04 ENCOUNTER — Ambulatory Visit (HOSPITAL_COMMUNITY): Payer: Self-pay

## 2014-11-04 ENCOUNTER — Ambulatory Visit (HOSPITAL_COMMUNITY): Payer: Self-pay | Admitting: Hematology & Oncology

## 2014-11-11 ENCOUNTER — Ambulatory Visit (HOSPITAL_COMMUNITY): Payer: Self-pay

## 2014-11-11 ENCOUNTER — Ambulatory Visit (HOSPITAL_COMMUNITY): Payer: Self-pay | Admitting: Oncology

## 2014-11-11 ENCOUNTER — Other Ambulatory Visit (HOSPITAL_COMMUNITY): Payer: Self-pay

## 2014-11-11 NOTE — Progress Notes (Signed)
Rescheduled

## 2014-11-18 ENCOUNTER — Encounter (HOSPITAL_COMMUNITY): Payer: Self-pay | Attending: Oncology

## 2014-11-18 ENCOUNTER — Encounter (HOSPITAL_COMMUNITY): Payer: Self-pay

## 2014-11-18 ENCOUNTER — Encounter (HOSPITAL_COMMUNITY): Payer: Self-pay | Attending: Oncology | Admitting: Oncology

## 2014-11-18 VITALS — BP 95/63 | HR 95 | Temp 98.6°F | Resp 20 | Wt 226.2 lb

## 2014-11-18 DIAGNOSIS — C50919 Malignant neoplasm of unspecified site of unspecified female breast: Secondary | ICD-10-CM

## 2014-11-18 DIAGNOSIS — G893 Neoplasm related pain (acute) (chronic): Secondary | ICD-10-CM

## 2014-11-18 DIAGNOSIS — F411 Generalized anxiety disorder: Secondary | ICD-10-CM | POA: Insufficient documentation

## 2014-11-18 DIAGNOSIS — C7951 Secondary malignant neoplasm of bone: Principal | ICD-10-CM

## 2014-11-18 DIAGNOSIS — C50312 Malignant neoplasm of lower-inner quadrant of left female breast: Secondary | ICD-10-CM | POA: Insufficient documentation

## 2014-11-18 DIAGNOSIS — C50912 Malignant neoplasm of unspecified site of left female breast: Secondary | ICD-10-CM

## 2014-11-18 DIAGNOSIS — R938 Abnormal findings on diagnostic imaging of other specified body structures: Secondary | ICD-10-CM | POA: Insufficient documentation

## 2014-11-18 DIAGNOSIS — R978 Other abnormal tumor markers: Secondary | ICD-10-CM | POA: Insufficient documentation

## 2014-11-18 LAB — CBC WITH DIFFERENTIAL/PLATELET
BASOS ABS: 0 10*3/uL (ref 0.0–0.1)
Basophils Relative: 0 %
EOS ABS: 0.1 10*3/uL (ref 0.0–0.7)
EOS PCT: 3 %
HCT: 31.3 % — ABNORMAL LOW (ref 36.0–46.0)
Hemoglobin: 11 g/dL — ABNORMAL LOW (ref 12.0–15.0)
Lymphocytes Relative: 30 %
Lymphs Abs: 1 10*3/uL (ref 0.7–4.0)
MCH: 38.7 pg — ABNORMAL HIGH (ref 26.0–34.0)
MCHC: 35.1 g/dL (ref 30.0–36.0)
MCV: 110.2 fL — ABNORMAL HIGH (ref 78.0–100.0)
Monocytes Absolute: 0.4 10*3/uL (ref 0.1–1.0)
Monocytes Relative: 12 %
Neutro Abs: 1.8 10*3/uL (ref 1.7–7.7)
Neutrophils Relative %: 55 %
PLATELETS: 253 10*3/uL (ref 150–400)
RBC: 2.84 MIL/uL — AB (ref 3.87–5.11)
RDW: 13.9 % (ref 11.5–15.5)
WBC: 3.2 10*3/uL — AB (ref 4.0–10.5)

## 2014-11-18 LAB — COMPREHENSIVE METABOLIC PANEL
ALT: 31 U/L (ref 14–54)
AST: 44 U/L — AB (ref 15–41)
Albumin: 4.1 g/dL (ref 3.5–5.0)
Alkaline Phosphatase: 80 U/L (ref 38–126)
Anion gap: 8 (ref 5–15)
BUN: 11 mg/dL (ref 6–20)
CHLORIDE: 102 mmol/L (ref 101–111)
CO2: 24 mmol/L (ref 22–32)
CREATININE: 0.89 mg/dL (ref 0.44–1.00)
Calcium: 8.3 mg/dL — ABNORMAL LOW (ref 8.9–10.3)
GFR calc Af Amer: >60 mL/min (ref >60)
GFR calc non Af Amer: >60 mL/min (ref >60)
Glucose, Bld: 133 mg/dL — ABNORMAL HIGH (ref 65–99)
Potassium: 4.4 mmol/L (ref 3.5–5.1)
SODIUM: 134 mmol/L — AB (ref 135–145)
Total Bilirubin: 0.4 mg/dL (ref 0.3–1.2)
Total Protein: 7.3 g/dL (ref 6.5–8.1)

## 2014-11-18 MED ORDER — OXYCODONE HCL 10 MG PO TABS: ORAL_TABLET | ORAL | Status: DC

## 2014-11-18 MED ORDER — MORPHINE SULFATE ER 60 MG PO TBCR
EXTENDED_RELEASE_TABLET | ORAL | Status: DC
Start: 2014-11-18 — End: 2015-01-03

## 2014-11-18 NOTE — Progress Notes (Signed)
No PCP Per Patient No address on file  Carcinoma of breast metastatic to bone, unspecified laterality - Plan: Oxycodone HCl 10 MG TABS, morphine (MS CONTIN) 60 MG 12 hr tablet  Breast cancer, unspecified laterality - Plan: Oxycodone HCl 10 MG TABS  Cancer related pain - Plan: morphine (MS CONTIN) 60 MG 12 hr tablet  CURRENT THERAPY: Ibrance 100 mg daily 3/1 fashion plus letrozole daily. Xgeva beginning on 01/17/2014.   INTERVAL HISTORY: Brittany Blair 54 y.o. female returns for followup of Metastatic breast cancer to bone after undergoing an iliac bone biopsy on 12/08/2013 demonstrating an ER/PR +, Her2 negative breast cancer. She was started on Ibrance plus letrozole will be given with ibrance 100 mg daily for 3 weeks out of every 4 and letrozole 2.5 mg daily.     Carcinoma of breast metastatic to bone   12/08/2013 Progression Iliac bone biopsy consistent with adenocarcinoma, ER/PR positive, HER-2/neu not overexpressed   12/23/2013 -  Chemotherapy Ibrance 100 mg daily for 3 weeks out of every 4 and letrozole 2.5 mg daily.   01/17/2014 Miscellaneous Xgeva 120 mg SQ every 28 days for bone metastases.   09/16/2014 Imaging Stable exam. Osseous metastatic disease is seen on the mostbrecent comparison study without substantial interval change.  No evidence for soft tissue metastases in the chest, abdomen, or pelvis.   09/22/2014 Imaging Bone scan- Stable abnormal uptake the medial aspect of the left clavicle, lateral aspects of 2 mid left ribs, and in the medial aspect of the left iliac bone. Slightly increased conspicuity of activity in the left inferior pubic ramus with new increase...    I personally reviewed and went over laboratory results with the patient.  The results are noted within this dictation.  Labs look stable.  Leukopenia is noted, but differential is WNL.  Her calcium is noted and she does not qualify for Xgeva today as a result.  We will hold Xgeva and compliance  with Ca++ and Vit D encouraged.  Her left arm is much improved from an Erythema standpoint compared to last visit.  She completed her dose of Bactrim.  Her lymphedema remains and issue.  She is not interested in lymphedema clinic.  She reports increased "pelvic pain" but she cannot tell me more about this pain.  She notes that her Oxycodone is effective, but does not last long.  She reports that she takes 2 at a time and therefore, will be running out early.  Will address pain regimen below.  Her bowels are working.   Past Medical History  Diagnosis Date  . Anxiety   . Herpes   . MRSA (methicillin resistant Staphylococcus aureus)   . Breast lump     left  . Hypertension   . Hematoma   . Swelling     left foot  . Multiple blisters     along surgical site  . Depression   . Genital herpes 08/04/2012  . PMB (postmenopausal bleeding) 06/23/2013  . Lymph edema   . Vaginal delivery 1981, 1984, 1987, 1992  . Breast cancer dx'd 05/27/2010    chemo/xrt comp 01/2011  . Carcinoma of breast metastatic to bone 05/28/2010    This patient presented in April with an invasive ductal carcinoma that had broken through the skin and was contaminated with MRSA. She was stage III. It was lower inner quadrant. It was receptor positive, HER-2/neu negative, and had a KI-67 of 75%.  She was treated with neoadjuvant chemotherapy.  She was recently hospitalized (June, 2012,) with multiple superficial MRSA infections. These have all r    has Carcinoma of breast metastatic to bone; Syncope; GAD (generalized anxiety disorder); HTN (hypertension), benign; Chronic chest pain; Depression; Transaminitis; Genital herpes; PMB (postmenopausal bleeding); Acute blood loss anemia; Abdominal pain; AKI (acute kidney injury); GIB (gastrointestinal bleeding); Syncope and collapse; Prepyloric ulcer; Left sided numbness; Hypotension; Hypocalcemia; Infiltrating ductal carcinoma of left breast; and Cancer related pain on her problem list.      is allergic to bee venom.  Current Outpatient Prescriptions on File Prior to Visit  Medication Sig Dispense Refill  . acyclovir (ZOVIRAX) 400 MG tablet Take 1 tablet (400 mg total) by mouth 2 (two) times daily. 60 tablet 6  . ALPRAZolam (XANAX) 1 MG tablet Take 1 tablet (1 mg total) by mouth 3 (three) times daily as needed. 90 tablet 5  . amLODipine (NORVASC) 5 MG tablet Take 1 tablet (5 mg total) by mouth daily. 180 tablet 2  . calcium-vitamin D (OSCAL WITH D) 500-200 MG-UNIT per tablet Take 2 tablets by mouth daily with breakfast. 60 tablet 11  . clotrimazole (LOTRIMIN) 1 % cream Apply 1 application topically 2 (two) times daily as needed (yeast rash).    Marland Kitchen letrozole (FEMARA) 2.5 MG tablet Take 1 tablet (2.5 mg total) by mouth daily. 90 tablet 3  . lisinopril (PRINIVIL,ZESTRIL) 40 MG tablet TAKE 1 TABLET BY MOUTH ONCE DAILY 30 tablet 3  . loratadine (CLARITIN) 10 MG tablet Take 10 mg by mouth daily as needed for allergies.     . metoprolol succinate (TOPROL-XL) 25 MG 24 hr tablet TAKE 3 TABLETS BY MOUTH ONCE DAILY WITH OR IMMEDIATELY FOLLOWING A MEAL 90 tablet 2  . palbociclib (IBRANCE) 100 MG capsule Take 1 capsule daily with food for 21 days, rest 7 days, then resume taking the medicine 3 weeks on, 1 week off. 21 capsule 6  . polyethylene glycol (MIRALAX / GLYCOLAX) packet Take 8.5 g by mouth daily as needed for moderate constipation.    . pregabalin (LYRICA) 75 MG capsule Take 2 capsules (150 mg total) by mouth daily. 60 capsule 3  . promethazine (PHENERGAN) 25 MG tablet Take 1 tablet (25 mg total) by mouth every 6 (six) hours as needed for nausea or vomiting. 30 tablet 6  . tetrahydrozoline-zinc (VISINE-AC) 0.05-0.25 % ophthalmic solution Place 2 drops into both eyes 3 (three) times daily as needed (for allergies).     . traZODone (DESYREL) 50 MG tablet TAKE 1 TABLET BY MOUTH AT BEDTIME. 30 tablet 6  . venlafaxine XR (EFFEXOR-XR) 75 MG 24 hr capsule TAKE 3 CAPSULES BY MOUTH DAILY 90  capsule 2  . sulfamethoxazole-trimethoprim (BACTRIM DS,SEPTRA DS) 800-160 MG per tablet Take 1 tablet by mouth 2 (two) times daily. (Patient not taking: Reported on 11/18/2014) 20 tablet 0  . [DISCONTINUED] FLUoxetine (PROZAC) 20 MG tablet Take 40 mg by mouth daily.       No current facility-administered medications on file prior to visit.    Past Surgical History  Procedure Laterality Date  . Tubal ligation    . Wisdom tooth extraction    . Mastectomy modified radical  10/10/10     left -Dr Margot Chimes  . Laparoscopic assisted vaginal hysterectomy Bilateral 10/14/2013    Procedure: LAPAROSCOPIC ASSISTED VAGINAL HYSTERECTOMY;  Surgeon: Ena Dawley, MD;  Location: [facility];  Service: Gynecology;  Laterality: Bilateral;  . Cystoscopy N/A 10/14/2013    Procedure: CYSTOSCOPY;  Surgeon: Ena Dawley, MD;  Location: [facility];  Service: Gynecology;  Laterality: N/A;  . Laparoscopic lysis of adhesions N/A 10/14/2013    Procedure: LAPAROSCOPIC LYSIS OF ADHESIONS;  Surgeon: Ena Dawley, MD;  Location: [facility];  Service: Gynecology;  Laterality: N/A;  . Laparoscopic bilateral salpingo oopherectomy Bilateral 10/14/2013    Procedure: LAPAROSCOPIC BILATERAL SALPINGO OOPHORECTOMY;  Surgeon: Ena Dawley, MD;  Location: [facility];  Service: Gynecology;  Laterality: Bilateral;  . Esophagogastroduodenoscopy N/A 10/27/2013    Procedure: ESOPHAGOGASTRODUODENOSCOPY (EGD);  Surgeon: Wonda Horner, MD;  Location: [facility];  Service: Endoscopy;  Laterality: N/A;  . Bone biopsy Left 11/2013    left post. hip    Denies any headaches, dizziness, double vision, fevers, chills, night sweats, nausea, vomiting, diarrhea, constipation, chest pain, heart palpitations, shortness of breath, blood in stool, black tarry stool, urinary pain, urinary burning, urinary frequency, hematuria.   PHYSICAL EXAMINATION  ECOG PERFORMANCE STATUS: 2 - Symptomatic, <50% confined to bed  Filed Vitals:   11/18/14 1450  BP: 95/63    Pulse: 95  Temp: 98.6 F (37 C)  Resp: 20    GENERAL:alert, well nourished, well developed, comfortable, cooperative, obese, smiling and not particularly clean SKIN: skin color, texture, turgor are normal, no rashes or significant lesions HEAD: Normocephalic, No masses, lesions, tenderness or abnormalities EYES: normal, PERRLA, EOMI EARS: External ears normal OROPHARYNX:lips, buccal mucosa, and tongue normal, mucous membranes are moist and erythema around lower lip   NECK: supple, thyroid normal size, non-tender, without nodularity, trachea midline LYMPH:  no palpable lymphadenopathy, no hepatosplenomegaly BREAST:not examined LUNGS: clear to auscultation  HEART: regular rate & rhythm, no murmurs and no gallops ABDOMEN:abdomen soft, non-tender, obese and normal bowel sounds BACK: Back symmetric, no curvature. EXTREMITIES:positive findings:  Left UE with lymphedema NEURO: alert & oriented x 3 with fluent speech, no focal motor/sensory deficits, in a wheelchair.   LABORATORY DATA: CBC    Component Value Date/Time   WBC 3.2* 11/18/2014 1430   WBC 7.1 01/24/2012 1539   RBC 2.84* 11/18/2014 1430   RBC 3.00* 10/27/2013 0800   RBC 4.39 01/24/2012 1539   HGB 11.0* 11/18/2014 1430   HGB 14.7 01/24/2012 1539   HCT 31.3* 11/18/2014 1430   HCT 43.7 01/24/2012 1539   PLT 253 11/18/2014 1430   PLT 258 01/24/2012 1539   MCV 110.2* 11/18/2014 1430   MCV 99.5 01/24/2012 1539   MCH 38.7* 11/18/2014 1430   MCH 33.5 01/24/2012 1539   MCHC 35.1 11/18/2014 1430   MCHC 33.6 01/24/2012 1539   RDW 13.9 11/18/2014 1430   RDW 14.1 01/24/2012 1539   LYMPHSABS 1.0 11/18/2014 1430   LYMPHSABS 1.4 01/24/2012 1539   MONOABS 0.4 11/18/2014 1430   MONOABS 0.6 01/24/2012 1539   EOSABS 0.1 11/18/2014 1430   EOSABS 0.3 01/24/2012 1539   BASOSABS 0.0 11/18/2014 1430   BASOSABS 0.0 01/24/2012 1539      Chemistry      Component Value Date/Time   NA 134* 11/18/2014 1430   K 4.4 11/18/2014 1430    CL 102 11/18/2014 1430   CO2 24 11/18/2014 1430   BUN 11 11/18/2014 1430   CREATININE 0.89 11/18/2014 1430      Component Value Date/Time   CALCIUM 8.3* 11/18/2014 1430   ALKPHOS 80 11/18/2014 1430   AST 44* 11/18/2014 1430   ALT 31 11/18/2014 1430   BILITOT 0.4 11/18/2014 1430     Lab Results  Component Value Date   LABCA2 34.9 10/07/2014    PENDING LABS:   RADIOGRAPHIC  STUDIES:  No results found.   PATHOLOGY:    ASSESSMENT AND PLAN:  Carcinoma of breast metastatic to bone ER/PR+, Her2 negative malignancy, on Letrozole and Ibrance 100 mg daily 3 weeks on and 1 week off every 4 weeks.  Tolerating well thus far.  On Xgeva therapy for bone metastases with Ca++ and Vit D to prevent hypocalcemia associated with Xgeva.    Labs as ordered.  Labs in 4 weeks: CBC diff, CMET, CA 27.29  Labs in 8 weeks: CBC diff, CMET, CA 27.29    Continue treatment as planned.    Delton See will be held today due to hypocalcemia.  Calcium and Vit D compliance encouraged.  Increase MS contin to 60 mg QID daily.  I have also increased the number of Oxycodone.  Return in 4 weeks for labs and Xgeva  Return in 8 weeks for labs, follow-up and Xgeva.   THERAPY PLAN:  Continue with treatment as planned.  All questions were answered. The patient knows to call the clinic with any problems, questions or concerns. We can certainly see the patient much sooner if necessary.  Patient and plan discussed with Dr. Ancil Linsey and she is in agreement with the aforementioned.   This note is electronically signed by: Doy Mince 11/18/2014 3:27 PM

## 2014-11-18 NOTE — Progress Notes (Signed)
Please see doctors encounter for more information 

## 2014-11-18 NOTE — Patient Instructions (Signed)
.  [place] at [facility] Discharge Instructions  RECOMMENDATIONS MADE BY THE CONSULTANT AND ANY TEST RESULTS WILL BE SENT TO YOUR REFERRING PHYSICIAN.  Exam today per T. Kefalas Labs in 1 month with possible xgeva 2 months to see Gershon Mussel and labs/xgeva again  Thank you for choosing [place] at [facility] to provide your oncology and hematology care.  To afford each patient quality time with our provider, please arrive at least 15 minutes before your scheduled appointment time.    You need to re-schedule your appointment should you arrive 10 or more minutes late.  We strive to give you quality time with our providers, and arriving late affects you and other patients whose appointments are after yours.  Also, if you no show three or more times for appointments you may be dismissed from the clinic at the providers discretion.     Again, thank you for choosing [facility].  Our hope is that these requests will decrease the amount of time that you wait before being seen by our physicians.       _____________________________________________________________  Should you have questions after your visit to [facility], please contact our office at [phone] between the hours of 8:30 a.m. and 4:30 p.m.  Voicemails left after 4:30 p.m. will not be returned until the following business day.  For prescription refill requests, have your pharmacy contact our office.

## 2014-11-18 NOTE — Assessment & Plan Note (Addendum)
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  Tolerating well thus far.  On Xgeva therapy for bone metastases with Ca++ and Vit D to prevent hypocalcemia associated with Xgeva.    Labs as ordered.  Labs in 4 weeks: CBC diff, CMET, CA 27.29  Labs in 8 weeks: CBC diff, CMET, CA 27.29    Continue treatment as planned.    Delton See will be held today due to hypocalcemia.  Calcium and Vit D compliance encouraged.  Increase MS contin to 60 mg QID daily.  I have also increased the number of Oxycodone.  Return in 4 weeks for labs and Xgeva  Return in 8 weeks for labs, follow-up and Xgeva.

## 2014-11-19 LAB — CANCER ANTIGEN 15-3: Cancer Antigen-Breast 15-3: 28 U/mL (ref <32)

## 2014-11-19 LAB — CANCER ANTIGEN 27.29: CA 27.29: 37 U/mL (ref 0.0–38.6)

## 2014-11-21 ENCOUNTER — Other Ambulatory Visit (HOSPITAL_COMMUNITY): Payer: Self-pay | Admitting: Oncology

## 2014-11-21 MED ORDER — PREGABALIN 75 MG PO CAPS: 150.0000 mg | ORAL_CAPSULE | Freq: Every day | ORAL | Status: DC

## 2014-12-06 ENCOUNTER — Other Ambulatory Visit (HOSPITAL_COMMUNITY): Payer: Self-pay | Admitting: Oncology

## 2014-12-06 DIAGNOSIS — C7951 Secondary malignant neoplasm of bone: Principal | ICD-10-CM

## 2014-12-06 DIAGNOSIS — C50919 Malignant neoplasm of unspecified site of unspecified female breast: Secondary | ICD-10-CM

## 2014-12-06 MED ORDER — OXYCODONE HCL 10 MG PO TABS: ORAL_TABLET | ORAL | Status: DC

## 2014-12-15 ENCOUNTER — Other Ambulatory Visit (HOSPITAL_COMMUNITY): Payer: Self-pay | Admitting: *Deleted

## 2014-12-15 ENCOUNTER — Other Ambulatory Visit (HOSPITAL_COMMUNITY): Payer: Self-pay

## 2014-12-15 DIAGNOSIS — C50919 Malignant neoplasm of unspecified site of unspecified female breast: Secondary | ICD-10-CM

## 2014-12-15 DIAGNOSIS — C7951 Secondary malignant neoplasm of bone: Principal | ICD-10-CM

## 2014-12-15 MED ORDER — LETROZOLE 2.5 MG PO TABS: 2.5000 mg | ORAL_TABLET | Freq: Every day | ORAL | Status: AC

## 2014-12-16 ENCOUNTER — Other Ambulatory Visit (HOSPITAL_COMMUNITY): Payer: Self-pay

## 2014-12-16 ENCOUNTER — Ambulatory Visit (HOSPITAL_COMMUNITY): Payer: Self-pay

## 2014-12-19 ENCOUNTER — Other Ambulatory Visit (HOSPITAL_COMMUNITY): Payer: Self-pay | Admitting: Oncology

## 2014-12-19 ENCOUNTER — Other Ambulatory Visit (HOSPITAL_COMMUNITY): Payer: Self-pay

## 2014-12-23 ENCOUNTER — Encounter (HOSPITAL_BASED_OUTPATIENT_CLINIC_OR_DEPARTMENT_OTHER): Payer: Self-pay

## 2014-12-23 ENCOUNTER — Encounter (HOSPITAL_COMMUNITY): Payer: Self-pay | Attending: Oncology

## 2014-12-23 ENCOUNTER — Other Ambulatory Visit (HOSPITAL_COMMUNITY): Payer: Self-pay | Admitting: Hematology & Oncology

## 2014-12-23 VITALS — BP 98/64 | HR 88 | Temp 98.0°F | Resp 18

## 2014-12-23 DIAGNOSIS — C7951 Secondary malignant neoplasm of bone: Secondary | ICD-10-CM | POA: Insufficient documentation

## 2014-12-23 DIAGNOSIS — R978 Other abnormal tumor markers: Secondary | ICD-10-CM | POA: Insufficient documentation

## 2014-12-23 DIAGNOSIS — C50912 Malignant neoplasm of unspecified site of left female breast: Secondary | ICD-10-CM

## 2014-12-23 DIAGNOSIS — R938 Abnormal findings on diagnostic imaging of other specified body structures: Secondary | ICD-10-CM | POA: Insufficient documentation

## 2014-12-23 DIAGNOSIS — C50919 Malignant neoplasm of unspecified site of unspecified female breast: Secondary | ICD-10-CM

## 2014-12-23 DIAGNOSIS — F411 Generalized anxiety disorder: Secondary | ICD-10-CM | POA: Insufficient documentation

## 2014-12-23 DIAGNOSIS — C50312 Malignant neoplasm of lower-inner quadrant of left female breast: Secondary | ICD-10-CM | POA: Insufficient documentation

## 2014-12-23 LAB — CBC WITH DIFFERENTIAL/PLATELET
BASOS ABS: 0 10*3/uL (ref 0.0–0.1)
Basophils Relative: 1 %
EOS PCT: 3 %
Eosinophils Absolute: 0.1 10*3/uL (ref 0.0–0.7)
HEMATOCRIT: 34.3 % — AB (ref 36.0–46.0)
Hemoglobin: 11.9 g/dL — ABNORMAL LOW (ref 12.0–15.0)
LYMPHS ABS: 1.2 10*3/uL (ref 0.7–4.0)
LYMPHS PCT: 33 %
MCH: 37.2 pg — AB (ref 26.0–34.0)
MCHC: 34.7 g/dL (ref 30.0–36.0)
MCV: 107.2 fL — AB (ref 78.0–100.0)
MONO ABS: 0.4 10*3/uL (ref 0.1–1.0)
MONOS PCT: 10 %
Neutro Abs: 1.9 10*3/uL (ref 1.7–7.7)
Neutrophils Relative %: 53 %
PLATELETS: 267 10*3/uL (ref 150–400)
RBC: 3.2 MIL/uL — ABNORMAL LOW (ref 3.87–5.11)
RDW: 13.8 % (ref 11.5–15.5)
WBC: 3.5 10*3/uL — ABNORMAL LOW (ref 4.0–10.5)

## 2014-12-23 LAB — COMPREHENSIVE METABOLIC PANEL
ALT: 38 U/L (ref 14–54)
ANION GAP: 8 (ref 5–15)
AST: 41 U/L (ref 15–41)
Albumin: 4.2 g/dL (ref 3.5–5.0)
Alkaline Phosphatase: 72 U/L (ref 38–126)
BILIRUBIN TOTAL: 0.5 mg/dL (ref 0.3–1.2)
BUN: 13 mg/dL (ref 6–20)
CHLORIDE: 99 mmol/L — AB (ref 101–111)
CO2: 25 mmol/L (ref 22–32)
Calcium: 9.3 mg/dL (ref 8.9–10.3)
Creatinine, Ser: 0.83 mg/dL (ref 0.44–1.00)
Glucose, Bld: 106 mg/dL — ABNORMAL HIGH (ref 65–99)
POTASSIUM: 4.3 mmol/L (ref 3.5–5.1)
Sodium: 132 mmol/L — ABNORMAL LOW (ref 135–145)
TOTAL PROTEIN: 7.6 g/dL (ref 6.5–8.1)

## 2014-12-23 MED ORDER — DENOSUMAB 120 MG/1.7ML ~~LOC~~ SOLN
120.0000 mg | Freq: Once | SUBCUTANEOUS | Status: AC
  Administered 2014-12-23: 120 mg via SUBCUTANEOUS
  Filled 2014-12-23: qty 1.7

## 2014-12-23 MED ORDER — OXYCODONE HCL 10 MG PO TABS: ORAL_TABLET | ORAL | Status: DC

## 2014-12-23 NOTE — Patient Instructions (Signed)
[place] at [facility] Discharge Instructions  RECOMMENDATIONS MADE BY THE CONSULTANT AND ANY TEST RESULTS WILL BE SENT TO YOUR REFERRING PHYSICIAN.  XGeva today. Please continue to take your Calcium as directed regularly. Please return as scheduled.    Thank you for choosing [place] at [facility] to provide your oncology and hematology care.  To afford each patient quality time with our provider, please arrive at least 15 minutes before your scheduled appointment time.    You need to re-schedule your appointment should you arrive 10 or more minutes late.  We strive to give you quality time with our providers, and arriving late affects you and other patients whose appointments are after yours.  Also, if you no show three or more times for appointments you may be dismissed from the clinic at the providers discretion.     Again, thank you for choosing [facility].  Our hope is that these requests will decrease the amount of time that you wait before being seen by our physicians.       _____________________________________________________________  Should you have questions after your visit to [facility], please contact our office at [phone] between the hours of 8:30 a.m. and 4:30 p.m.  Voicemails left after 4:30 p.m. will not be returned until the following business day.  For prescription refill requests, have your pharmacy contact our office.

## 2014-12-23 NOTE — Progress Notes (Signed)
Brittany Blair presents today for injection per MD orders. XGeva administered SQ in right Upper Arm. Administration without incident. Patient tolerated well.

## 2014-12-24 LAB — CANCER ANTIGEN 15-3: CANCER ANTIGEN-BREAST 15-3: 33 U/mL — AB (ref <32)

## 2014-12-24 LAB — CANCER ANTIGEN 27.29: CA 27.29: 48.6 U/mL — ABNORMAL HIGH (ref 0.0–38.6)

## 2014-12-26 NOTE — Progress Notes (Signed)
Labs drawn

## 2014-12-30 ENCOUNTER — Ambulatory Visit (HOSPITAL_COMMUNITY): Payer: Self-pay

## 2014-12-30 ENCOUNTER — Other Ambulatory Visit (HOSPITAL_COMMUNITY): Payer: Self-pay

## 2014-12-30 ENCOUNTER — Ambulatory Visit (HOSPITAL_COMMUNITY): Payer: Self-pay | Admitting: Oncology

## 2015-01-03 ENCOUNTER — Other Ambulatory Visit (HOSPITAL_COMMUNITY): Payer: Self-pay | Admitting: Oncology

## 2015-01-03 DIAGNOSIS — C7951 Secondary malignant neoplasm of bone: Principal | ICD-10-CM

## 2015-01-03 DIAGNOSIS — G893 Neoplasm related pain (acute) (chronic): Secondary | ICD-10-CM

## 2015-01-03 DIAGNOSIS — C50919 Malignant neoplasm of unspecified site of unspecified female breast: Secondary | ICD-10-CM

## 2015-01-03 MED ORDER — MORPHINE SULFATE ER 60 MG PO TBCR: EXTENDED_RELEASE_TABLET | ORAL | Status: DC

## 2015-01-03 MED ORDER — OXYCODONE HCL 10 MG PO TABS: ORAL_TABLET | ORAL | Status: DC

## 2015-01-12 ENCOUNTER — Telehealth (HOSPITAL_COMMUNITY): Payer: Self-pay | Admitting: Hematology & Oncology

## 2015-01-12 NOTE — Telephone Encounter (Signed)
IBRANCE ASSIST EXPS 12/30/15. CONTACT [facility] [phone]

## 2015-01-18 ENCOUNTER — Other Ambulatory Visit (HOSPITAL_COMMUNITY): Payer: Self-pay | Admitting: Oncology

## 2015-01-18 DIAGNOSIS — C7951 Secondary malignant neoplasm of bone: Principal | ICD-10-CM

## 2015-01-18 DIAGNOSIS — C50919 Malignant neoplasm of unspecified site of unspecified female breast: Secondary | ICD-10-CM

## 2015-01-18 DIAGNOSIS — G893 Neoplasm related pain (acute) (chronic): Secondary | ICD-10-CM

## 2015-01-18 MED ORDER — OXYCODONE HCL 10 MG PO TABS: ORAL_TABLET | ORAL | Status: DC

## 2015-01-18 MED ORDER — MORPHINE SULFATE ER 60 MG PO TBCR: EXTENDED_RELEASE_TABLET | ORAL | Status: DC

## 2015-01-27 ENCOUNTER — Ambulatory Visit (HOSPITAL_COMMUNITY): Payer: Self-pay | Admitting: Hematology & Oncology

## 2015-01-27 ENCOUNTER — Other Ambulatory Visit (HOSPITAL_COMMUNITY): Payer: Self-pay

## 2015-01-27 ENCOUNTER — Ambulatory Visit (HOSPITAL_COMMUNITY): Payer: Self-pay

## 2015-02-06 ENCOUNTER — Other Ambulatory Visit (HOSPITAL_COMMUNITY): Payer: Self-pay | Admitting: Oncology

## 2015-02-06 ENCOUNTER — Telehealth (HOSPITAL_COMMUNITY): Payer: Self-pay

## 2015-02-06 DIAGNOSIS — C50919 Malignant neoplasm of unspecified site of unspecified female breast: Secondary | ICD-10-CM

## 2015-02-06 DIAGNOSIS — C7951 Secondary malignant neoplasm of bone: Principal | ICD-10-CM

## 2015-02-06 MED ORDER — OXYCODONE HCL 10 MG PO TABS: ORAL_TABLET | ORAL | Status: DC

## 2015-02-06 NOTE — Telephone Encounter (Signed)
-----   Message from Baird Cancer, PA-C sent at 02/06/2015  3:20 PM EST ----- Regarding: RE: refill I wrote an Oxy refill that was not to be filled until 12/5.  When did they get the Oxy and does she really need a refill this soon if filled ~1 week ago?  TK ----- Message -----    From: Louis Meckel    Sent: 02/06/2015   2:56 PM      To: Baird Cancer, PA-C Subject: refill                                         Patient needs refill on oxy.   Thanks

## 2015-02-06 NOTE — Telephone Encounter (Signed)
Message left for call back to clinic to discuss.

## 2015-02-09 ENCOUNTER — Encounter (HOSPITAL_COMMUNITY): Payer: Self-pay

## 2015-02-09 ENCOUNTER — Encounter (HOSPITAL_COMMUNITY): Payer: Self-pay | Admitting: Hematology & Oncology

## 2015-02-10 ENCOUNTER — Encounter (HOSPITAL_COMMUNITY): Payer: Self-pay

## 2015-02-10 ENCOUNTER — Encounter (HOSPITAL_COMMUNITY): Payer: Self-pay | Attending: Oncology

## 2015-02-10 VITALS — BP 100/56 | HR 95 | Temp 99.1°F | Resp 18

## 2015-02-10 DIAGNOSIS — R938 Abnormal findings on diagnostic imaging of other specified body structures: Secondary | ICD-10-CM | POA: Insufficient documentation

## 2015-02-10 DIAGNOSIS — C50312 Malignant neoplasm of lower-inner quadrant of left female breast: Secondary | ICD-10-CM | POA: Insufficient documentation

## 2015-02-10 DIAGNOSIS — C50919 Malignant neoplasm of unspecified site of unspecified female breast: Secondary | ICD-10-CM

## 2015-02-10 DIAGNOSIS — F411 Generalized anxiety disorder: Secondary | ICD-10-CM | POA: Insufficient documentation

## 2015-02-10 DIAGNOSIS — C7951 Secondary malignant neoplasm of bone: Secondary | ICD-10-CM | POA: Insufficient documentation

## 2015-02-10 DIAGNOSIS — R978 Other abnormal tumor markers: Secondary | ICD-10-CM | POA: Insufficient documentation

## 2015-02-10 LAB — CBC WITH DIFFERENTIAL/PLATELET
Basophils Absolute: 0 10*3/uL (ref 0.0–0.1)
Basophils Relative: 1 %
EOS ABS: 0.1 10*3/uL (ref 0.0–0.7)
Eosinophils Relative: 2 %
HCT: 33.8 % — ABNORMAL LOW (ref 36.0–46.0)
HEMOGLOBIN: 11.4 g/dL — AB (ref 12.0–15.0)
LYMPHS ABS: 1.1 10*3/uL (ref 0.7–4.0)
LYMPHS PCT: 26 %
MCH: 35.8 pg — AB (ref 26.0–34.0)
MCHC: 33.7 g/dL (ref 30.0–36.0)
MCV: 106.3 fL — AB (ref 78.0–100.0)
Monocytes Absolute: 0.4 10*3/uL (ref 0.1–1.0)
Monocytes Relative: 9 %
NEUTROS ABS: 2.8 10*3/uL (ref 1.7–7.7)
NEUTROS PCT: 62 %
Platelets: 376 10*3/uL (ref 150–400)
RBC: 3.18 MIL/uL — AB (ref 3.87–5.11)
RDW: 14.2 % (ref 11.5–15.5)
WBC: 4.4 10*3/uL (ref 4.0–10.5)

## 2015-02-10 LAB — COMPREHENSIVE METABOLIC PANEL
ALK PHOS: 90 U/L (ref 38–126)
ALT: 54 U/L (ref 14–54)
AST: 45 U/L — ABNORMAL HIGH (ref 15–41)
Albumin: 4.4 g/dL (ref 3.5–5.0)
Anion gap: 10 (ref 5–15)
BUN: 20 mg/dL (ref 6–20)
CALCIUM: 9.1 mg/dL (ref 8.9–10.3)
CO2: 23 mmol/L (ref 22–32)
CREATININE: 1 mg/dL (ref 0.44–1.00)
Chloride: 101 mmol/L (ref 101–111)
GFR calc non Af Amer: >60 mL/min (ref >60)
Glucose, Bld: 93 mg/dL (ref 65–99)
Potassium: 4.5 mmol/L (ref 3.5–5.1)
SODIUM: 134 mmol/L — AB (ref 135–145)
Total Bilirubin: 0.4 mg/dL (ref 0.3–1.2)
Total Protein: 8 g/dL (ref 6.5–8.1)

## 2015-02-10 MED ORDER — DENOSUMAB 120 MG/1.7ML ~~LOC~~ SOLN
120.0000 mg | Freq: Once | SUBCUTANEOUS | Status: AC
  Administered 2015-02-10: 120 mg via SUBCUTANEOUS
  Filled 2015-02-10: qty 1.7

## 2015-02-10 NOTE — Patient Instructions (Signed)
[place] at [facility] Discharge Instructions  RECOMMENDATIONS MADE BY THE CONSULTANT AND ANY TEST RESULTS WILL BE SENT TO YOUR REFERRING PHYSICIAN.  Xgeva injection today. Return as scheduled for office visit.   Thank you for choosing [place] at [facility] to provide your oncology and hematology care.  To afford each patient quality time with our provider, please arrive at least 15 minutes before your scheduled appointment time.    You need to re-schedule your appointment should you arrive 10 or more minutes late.  We strive to give you quality time with our providers, and arriving late affects you and other patients whose appointments are after yours.  Also, if you no show three or more times for appointments you may be dismissed from the clinic at the providers discretion.     Again, thank you for choosing [facility].  Our hope is that these requests will decrease the amount of time that you wait before being seen by our physicians.       _____________________________________________________________  Should you have questions after your visit to [facility], please contact our office at [phone] between the hours of 8:30 a.m. and 4:30 p.m.  Voicemails left after 4:30 p.m. will not be returned until the following business day.  For prescription refill requests, have your pharmacy contact our office.

## 2015-02-10 NOTE — Progress Notes (Signed)
This encounter was created in error - please disregard.

## 2015-02-10 NOTE — Progress Notes (Signed)
1445:  Brittany Blair presents today for injection per the provider's orders.  Xgeva administration without incident; see MAR for injection details.  Patient tolerated procedure well and without incident.  No questions or complaints noted at this time.

## 2015-02-11 LAB — CANCER ANTIGEN 15-3: Cancer Antigen-Breast 15-3: 32 U/mL — ABNORMAL HIGH (ref <32)

## 2015-02-11 LAB — CANCER ANTIGEN 27.29: CA 27.29: 42.3 U/mL — AB (ref 0.0–38.6)

## 2015-02-22 ENCOUNTER — Other Ambulatory Visit (HOSPITAL_COMMUNITY): Payer: Self-pay | Admitting: Oncology

## 2015-02-22 ENCOUNTER — Telehealth (HOSPITAL_COMMUNITY): Payer: Self-pay

## 2015-02-22 DIAGNOSIS — C7951 Secondary malignant neoplasm of bone: Principal | ICD-10-CM

## 2015-02-22 DIAGNOSIS — C50919 Malignant neoplasm of unspecified site of unspecified female breast: Secondary | ICD-10-CM

## 2015-02-22 MED ORDER — OXYCODONE HCL 10 MG PO TABS: ORAL_TABLET | ORAL | Status: DC

## 2015-02-22 NOTE — Telephone Encounter (Signed)
-----   Message from Baird Cancer, PA-C sent at 02/22/2015  3:43 PM EST ----- Regarding: FW: refill I have refilled her Oxycodone.  However, she went through 150 in 2 weeks.  That equates to 10 tablets daily.  I am only giving 150 and this is a month supply.  She will need to make it last. If she is having that much pain, then we need to consider increasing her long-acting pain medication.  Let her know.  KEFALAS,THOMAS  ----- Message -----    From: Louis Meckel    Sent: 02/22/2015   2:15 PM      To: Baird Cancer, PA-C Subject: refill                                         Patient needs refill on 10mg  oxy  Thanks

## 2015-02-22 NOTE — Telephone Encounter (Signed)
Spoke with husband and relayed information and verbalized understanding of instructions.  He will also discuss with Nattaly.

## 2015-02-24 ENCOUNTER — Encounter (HOSPITAL_COMMUNITY): Payer: Self-pay | Admitting: Oncology

## 2015-02-24 ENCOUNTER — Encounter (HOSPITAL_BASED_OUTPATIENT_CLINIC_OR_DEPARTMENT_OTHER): Payer: Self-pay | Admitting: Oncology

## 2015-02-24 VITALS — BP 118/71 | HR 93 | Temp 98.6°F | Resp 20

## 2015-02-24 DIAGNOSIS — G4700 Insomnia, unspecified: Secondary | ICD-10-CM

## 2015-02-24 DIAGNOSIS — C50919 Malignant neoplasm of unspecified site of unspecified female breast: Secondary | ICD-10-CM

## 2015-02-24 DIAGNOSIS — G893 Neoplasm related pain (acute) (chronic): Secondary | ICD-10-CM

## 2015-02-24 DIAGNOSIS — M545 Low back pain: Secondary | ICD-10-CM

## 2015-02-24 DIAGNOSIS — Z0000 Encounter for general adult medical examination without abnormal findings: Secondary | ICD-10-CM

## 2015-02-24 DIAGNOSIS — G8929 Other chronic pain: Secondary | ICD-10-CM

## 2015-02-24 DIAGNOSIS — Z23 Encounter for immunization: Secondary | ICD-10-CM

## 2015-02-24 DIAGNOSIS — I10 Essential (primary) hypertension: Secondary | ICD-10-CM

## 2015-02-24 DIAGNOSIS — C7951 Secondary malignant neoplasm of bone: Secondary | ICD-10-CM

## 2015-02-24 DIAGNOSIS — M62838 Other muscle spasm: Secondary | ICD-10-CM

## 2015-02-24 MED ORDER — LISINOPRIL 40 MG PO TABS: 40.0000 mg | ORAL_TABLET | Freq: Every day | ORAL | Status: DC

## 2015-02-24 MED ORDER — CYCLOBENZAPRINE HCL 10 MG PO TABS: 10.0000 mg | ORAL_TABLET | Freq: Three times a day (TID) | ORAL | Status: DC | PRN

## 2015-02-24 MED ORDER — MORPHINE SULFATE ER 60 MG PO TBCR: EXTENDED_RELEASE_TABLET | ORAL | Status: DC

## 2015-02-24 MED ORDER — TEMAZEPAM 15 MG PO CAPS: 15.0000 mg | ORAL_CAPSULE | Freq: Every evening | ORAL | Status: DC | PRN

## 2015-02-24 MED ORDER — INFLUENZA VAC SPLIT QUAD 0.5 ML IM SUSY
0.5000 mL | PREFILLED_SYRINGE | Freq: Once | INTRAMUSCULAR | Status: AC
  Administered 2015-02-24: 0.5 mL via INTRAMUSCULAR
  Filled 2015-02-24: qty 0.5

## 2015-02-24 NOTE — Progress Notes (Signed)
No PCP Per Patient No address on file  Carcinoma of breast metastatic to bone, unspecified laterality (Brittany Blair) - Plan: CT Abdomen Pelvis W Contrast, CT Chest W Contrast, NM Bone Scan Whole Body, morphine (MS CONTIN) 60 MG 12 hr tablet, DISCONTINUED: morphine (MS CONTIN) 60 MG 12 hr tablet  Cancer related pain - Plan: morphine (MS CONTIN) 60 MG 12 hr tablet, DISCONTINUED: morphine (MS CONTIN) 60 MG 12 hr tablet  Muscle spasm - Plan: cyclobenzaprine (FLEXERIL) 10 MG tablet, DISCONTINUED: cyclobenzaprine (FLEXERIL) 10 MG tablet  Preventative health care - Plan: Influenza vac split quadrivalent PF (FLUARIX) injection 0.5 mL  Insomnia - Plan: temazepam (RESTORIL) 15 MG capsule, DISCONTINUED: temazepam (RESTORIL) 15 MG capsule  Essential hypertension - Plan: lisinopril (PRINIVIL,ZESTRIL) 40 MG tablet  CURRENT THERAPY: Ibrance 100 mg daily 3/1 fashion plus letrozole daily. Xgeva beginning on 01/17/2014.   INTERVAL HISTORY: Brittany Blair 54 y.o. female returns for followup of Metastatic breast cancer to bone after undergoing an iliac bone biopsy on 12/08/2013 demonstrating an ER/PR +, Her2 negative breast cancer. She was started on Ibrance plus letrozole will be given with ibrance 100 mg daily for 3 weeks out of every 4 and letrozole 2.5 mg daily.     Carcinoma of breast metastatic to bone (Brittany Blair)   12/08/2013 Progression Iliac bone biopsy consistent with adenocarcinoma, ER/PR positive, HER-2/neu not overexpressed   12/23/2013 -  Chemotherapy Ibrance 100 mg daily for 3 weeks out of every 4 and letrozole 2.5 mg daily.   01/17/2014 Miscellaneous Xgeva 120 mg SQ every 28 days for bone metastases.   09/16/2014 Imaging Stable exam. Osseous metastatic disease is seen on the mostbrecent comparison study without substantial interval change.  No evidence for soft tissue metastases in the chest, abdomen, or pelvis.   09/22/2014 Imaging Bone scan- Stable abnormal uptake the medial aspect of the  left clavicle, lateral aspects of 2 mid left ribs, and in the medial aspect of the left iliac bone. Slightly increased conspicuity of activity in the left inferior pubic ramus with new increase...    I personally reviewed and went over laboratory results with the patient.  The results are noted within this dictation.    Her husband called the other day requesting a refill on the patient's Oxycodone.  By my calculation, that means Brittany Blair is using 10 Oxycodone tablets daily.  She is on long-acting pain medication as well.  She reports via telephone that her pain has changed.  On chart review, it has nearly been 6 months since her last imaging.  Her cancer markers have remained relatively stable.  She notes that she is "too weak to stand" and at times cannot walk any distance as her back goes in to "spasms."  She describes the spasm as a large man squeezing her paraspinal region and radiating to her abdomen.  She notes that it has a burning sensation as well.  She reports that it is constant, but is exacerbated with activity such as ambulating and standing.  She notes that she can stand for about 8 minutes before she urgently has to sit down to help ease the discomfort.  She denies any urinary complaints.    She reports that she is compliant with her pain medication.  She denies any drowsiness and mental status changes with her pain medications.  Due to the chronicity of her pain, I will alter her long-acting pain medication.   She also is concerned about her weight gain.  She  is disappointed that she weighs 240 lbs.  She notes that she was "fat" when she was first diagnosed with her early stage breast cancer when she weighed 150 lbs.  Her weight gain is multifactorial with a large element of inactivity.  She is tearful regarding this.   Past Medical History  Diagnosis Date  . Anxiety   . Herpes   . MRSA (methicillin resistant Staphylococcus aureus)   . Breast lump     left  . Hypertension   .  Hematoma   . Swelling     left foot  . Multiple blisters     along surgical site  . Depression   . Genital herpes 08/04/2012  . PMB (postmenopausal bleeding) 06/23/2013  . Lymph edema   . Vaginal delivery 1981, 1984, 1987, 1992  . Breast cancer ([place]) dx'd 05/27/2010    chemo/xrt comp 01/2011  . Carcinoma of breast metastatic to bone ([place]) 05/28/2010    This patient presented in April with an invasive ductal carcinoma that had broken through the skin and was contaminated with MRSA. She was stage III. It was lower inner quadrant. It was receptor positive, HER-2/neu negative, and had a KI-67 of 75%.  She was treated with neoadjuvant chemotherapy. She was recently hospitalized (June, 2012,) with multiple superficial MRSA infections. These have all r    has Carcinoma of breast metastatic to bone [facility]); Syncope; GAD (generalized anxiety disorder); HTN (hypertension), benign; Chronic chest pain; Depression; Transaminitis; Genital herpes; PMB (postmenopausal bleeding); Acute blood loss anemia; Abdominal pain; AKI (acute kidney injury) ([place]); GIB (gastrointestinal bleeding); Syncope and collapse; Prepyloric ulcer; Left sided numbness; Hypotension; Hypocalcemia; Infiltrating ductal carcinoma of left breast ([place]); and Cancer related pain on her problem list.     is allergic to bee venom.  Current Outpatient Prescriptions on File Prior to Visit  Medication Sig Dispense Refill  . acyclovir (ZOVIRAX) 400 MG tablet TAKE 1 TABLET BY MOUTH TWICE DAILY 60 tablet 5  . ALPRAZolam (XANAX) 1 MG tablet Take 1 tablet (1 mg total) by mouth 3 (three) times daily as needed. 90 tablet 5  . amLODipine (NORVASC) 5 MG tablet TAKE 1 TABLET BY MOUTH TWICE DAILY 180 tablet 4  . clotrimazole (LOTRIMIN) 1 % cream Apply 1 application topically 2 (two) times daily as needed (yeast rash).    Marland Kitchen letrozole (FEMARA) 2.5 MG tablet Take 1 tablet (2.5 mg total) by mouth daily. 90 tablet 3  . loratadine (CLARITIN) 10 MG tablet Take 10 mg by  mouth daily as needed for allergies.     . metoprolol succinate (TOPROL-XL) 25 MG 24 hr tablet TAKE 3 TABLETS BY MOUTH ONCE DAILY WITH OR IMMEDIATELY FOLLOWING A MEAL 90 tablet 2  . Oxycodone HCl 10 MG TABS Take 1 or 2 tablets every 4 hours to control pain. 150 tablet 0  . palbociclib (IBRANCE) 100 MG capsule Take 1 capsule daily with food for 21 days, rest 7 days, then resume taking the medicine 3 weeks on, 1 week off. 21 capsule 6  . polyethylene glycol (MIRALAX / GLYCOLAX) packet Take 8.5 g by mouth daily as needed for moderate constipation.    . pregabalin (LYRICA) 75 MG capsule Take 2 capsules (150 mg total) by mouth daily. 60 capsule 3  . promethazine (PHENERGAN) 25 MG tablet Take 1 tablet (25 mg total) by mouth every 6 (six) hours as needed for nausea or vomiting. 30 tablet 6  . tetrahydrozoline-zinc (VISINE-AC) 0.05-0.25 % ophthalmic solution Place 2 drops into both eyes  3 (three) times daily as needed (for allergies).     . traZODone (DESYREL) 50 MG tablet TAKE 1 TABLET BY MOUTH AT BEDTIME. 30 tablet 6  . venlafaxine XR (EFFEXOR-XR) 75 MG 24 hr capsule TAKE 3 CAPSULES BY MOUTH DAILY 90 capsule 2  . calcium-vitamin D (OSCAL WITH D) 500-200 MG-UNIT per tablet Take 2 tablets by mouth daily with breakfast. (Patient not taking: Reported on 02/24/2015) 60 tablet 11  . [DISCONTINUED] FLUoxetine (PROZAC) 20 MG tablet Take 40 mg by mouth daily.       No current facility-administered medications on file prior to visit.    Past Surgical History  Procedure Laterality Date  . Tubal ligation    . Wisdom tooth extraction    . Mastectomy modified radical  10/10/10     left -Dr Margot Chimes  . Laparoscopic assisted vaginal hysterectomy Bilateral 10/14/2013    Procedure: LAPAROSCOPIC ASSISTED VAGINAL HYSTERECTOMY;  Surgeon: Ena Dawley, MD;  Location: [facility];  Service: Gynecology;  Laterality: Bilateral;  . Cystoscopy N/A 10/14/2013    Procedure: CYSTOSCOPY;  Surgeon: Ena Dawley, MD;  Location: [place] [facility];  Service: Gynecology;  Laterality: N/A;  . Laparoscopic lysis of adhesions N/A 10/14/2013    Procedure: LAPAROSCOPIC LYSIS OF ADHESIONS;  Surgeon: Ena Dawley, MD;  Location: [facility];  Service: Gynecology;  Laterality: N/A;  . Laparoscopic bilateral salpingo oopherectomy Bilateral 10/14/2013    Procedure: LAPAROSCOPIC BILATERAL SALPINGO OOPHORECTOMY;  Surgeon: Ena Dawley, MD;  Location: [place] [facility];  Service: Gynecology;  Laterality: Bilateral;  . Esophagogastroduodenoscopy N/A 10/27/2013    Procedure: ESOPHAGOGASTRODUODENOSCOPY (EGD);  Surgeon: Wonda Horner, MD;  Location: [facility];  Service: Endoscopy;  Laterality: N/A;  . Bone biopsy Left 11/2013    left post. hip    Denies any headaches, dizziness, double vision, fevers, chills, night sweats, nausea, vomiting, diarrhea, constipation, chest pain, heart palpitations, shortness of breath, blood in stool, black tarry stool, urinary pain, urinary burning, urinary frequency, hematuria.   PHYSICAL EXAMINATION  ECOG PERFORMANCE STATUS: 2 - Symptomatic, <50% confined to bed  Filed Vitals:   02/24/15 1437  BP: 118/71  Pulse: 93  Temp: 98.6 F (37 C)  Resp: 20    GENERAL:alert, well nourished, well developed, comfortable, cooperative, obese, smiling and not particularly clean, accompanied by her husband, smiling and tearful at times, in a wheelchair. SKIN: skin color, texture, turgor are normal, no rashes or significant lesions HEAD: Normocephalic, No masses, lesions, tenderness or abnormalities EYES: normal, PERRLA, EOMI EARS: External ears normal OROPHARYNX:lips, buccal mucosa, and tongue normal, mucous membranes are moist  NECK: supple, thyroid normal size, non-tender, without nodularity, trachea midline LYMPH:  no palpable lymphadenopathy, no hepatosplenomegaly BREAST:not examined LUNGS: clear to auscultation  HEART: regular rate & rhythm, no murmurs and no gallops ABDOMEN:abdomen soft, non-tender, obese and normal bowel  sounds BACK: Back symmetric, no curvature. EXTREMITIES:positive findings:  Left UE with lymphedema NEURO: alert & oriented x 3 with fluent speech, no focal motor/sensory deficits, in a wheelchair.   LABORATORY DATA: CBC    Component Value Date/Time   WBC 4.4 02/10/2015 1138   WBC 7.1 01/24/2012 1539   RBC 3.18* 02/10/2015 1138   RBC 3.00* 10/27/2013 0800   RBC 4.39 01/24/2012 1539   HGB 11.4* 02/10/2015 1138   HGB 14.7 01/24/2012 1539   HCT 33.8* 02/10/2015 1138   HCT 43.7 01/24/2012 1539   PLT 376 02/10/2015 1138   PLT 258 01/24/2012 1539   MCV 106.3* 02/10/2015 1138   MCV 99.5 01/24/2012 1539  MCH 35.8* 02/10/2015 1138   MCH 33.5 01/24/2012 1539   MCHC 33.7 02/10/2015 1138   MCHC 33.6 01/24/2012 1539   RDW 14.2 02/10/2015 1138   RDW 14.1 01/24/2012 1539   LYMPHSABS 1.1 02/10/2015 1138   LYMPHSABS 1.4 01/24/2012 1539   MONOABS 0.4 02/10/2015 1138   MONOABS 0.6 01/24/2012 1539   EOSABS 0.1 02/10/2015 1138   EOSABS 0.3 01/24/2012 1539   BASOSABS 0.0 02/10/2015 1138   BASOSABS 0.0 01/24/2012 1539      Chemistry      Component Value Date/Time   NA 134* 02/10/2015 1138   K 4.5 02/10/2015 1138   CL 101 02/10/2015 1138   CO2 23 02/10/2015 1138   BUN 20 02/10/2015 1138   CREATININE 1.00 02/10/2015 1138      Component Value Date/Time   CALCIUM 9.1 02/10/2015 1138   ALKPHOS 90 02/10/2015 1138   AST 45* 02/10/2015 1138   ALT 54 02/10/2015 1138   BILITOT 0.4 02/10/2015 1138     Lab Results  Component Value Date   LABCA2 42.3* 02/10/2015    PENDING LABS:   RADIOGRAPHIC STUDIES:  No results found.   PATHOLOGY:    ASSESSMENT AND PLAN:  Carcinoma of breast metastatic to bone ER/PR+, Her2 negative malignancy, on Letrozole and Ibrance 100 mg daily 3 weeks on and 1 week off every 4 weeks.  Tolerating well thus far.  On Xgeva therapy for bone metastases with Ca++ and Vit D to prevent hypocalcemia associated with Xgeva.    Labs in 2 weeks: CBC diff, CMET, CA  27.29  Labs in 6 weeks: CBC diff, CMET, CA 27.29    Continue treatment as planned.    She reports back pain that is persistent and has changed over the past few months.  Her description is suspicious for muscle cramping.  She notes that oxycodone is effective for this pain.  I have suggested the addition of a muscle relaxant to her pain regimen.  She is agreeable to this plan of action.  I have prescribed Flexeril 10 mg PO every 6-8 hours PRN.  I am hopeful that may help with her discomfort.  She notes continued chronic pain that has progressed.  She denies any drowsiness or mental status changes with MS Contin.  As a result, I will increase by another 60 mg, particularly in light of her using 10 Oxycodone daily of late for pain control.  Therefore, she will take 120 mg in AM, 60 mg at lunch time, and 120 mg in PM.  She has chronic insomnia dating back years.  She is hesitant to use Ambien to help with insomnia.  She notes good tolerance to Xanax.  I have discussed using Temazepam and she is agreeable to this medication.  I will prescribe Restoril 15-30 mg PO at HS.  I refilled her Lisinopril as well.  Given her increasing pain, and her last imaging was 5 months ago, I will restage her over the next 4-6 weeks with CT CAP and bone scan.  She is agreeable to this plan too.  Influenza vaccine today.  Return in 2  weeks for labs and Xgeva  Return in 6 weeks for labs, review of imaging, follow-up and Xgeva.   THERAPY PLAN:  Continue with treatment as planned.  Time spent with patient in exam was 45+ minutes, 60+ minutes spent on entire encounter.  All questions were answered. The patient knows to call the clinic with any problems, questions or concerns. We can certainly  see the patient much sooner if necessary.  More than 50% of the time spent with the patient was utilized for counseling and coordination of care.  Patient and plan discussed with Dr. Ancil Linsey and she is in agreement  with the aforementioned.   This note is electronically signed by: Doy Mince 02/24/2015 4:57 PM

## 2015-02-24 NOTE — Assessment & Plan Note (Addendum)
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  Tolerating well thus far.  On Xgeva therapy for bone metastases with Ca++ and Vit D to prevent hypocalcemia associated with Xgeva.    Labs in 2 weeks: CBC diff, CMET, CA 27.29  Labs in 6 weeks: CBC diff, CMET, CA 27.29    Continue treatment as planned.    She reports back pain that is persistent and has changed over the past few months.  Her description is suspicious for muscle cramping.  She notes that oxycodone is effective for this pain.  I have suggested the addition of a muscle relaxant to her pain regimen.  She is agreeable to this plan of action.  I have prescribed Flexeril 10 mg PO every 6-8 hours PRN.  I am hopeful that may help with her discomfort.  She notes continued chronic pain that has progressed.  She denies any drowsiness or mental status changes with MS Contin.  As a result, I will increase by another 60 mg, particularly in light of her using 10 Oxycodone daily of late for pain control.  Therefore, she will take 120 mg in AM, 60 mg at lunch time, and 120 mg in PM.  She has chronic insomnia dating back years.  She is hesitant to use Ambien to help with insomnia.  She notes good tolerance to Xanax.  I have discussed using Temazepam and she is agreeable to this medication.  I will prescribe Restoril 15-30 mg PO at HS.  I refilled her Lisinopril as well.  Given her increasing pain, and her last imaging was 5 months ago, I will restage her over the next 4-6 weeks with CT CAP and bone scan.  She is agreeable to this plan too.  Influenza vaccine today.  Return in 2  weeks for labs and Xgeva  Return in 6 weeks for labs, review of imaging, follow-up and Xgeva.

## 2015-02-24 NOTE — Patient Instructions (Addendum)
[place] at [facility] Discharge Instructions  RECOMMENDATIONS MADE BY THE CONSULTANT AND ANY TEST RESULTS WILL BE SENT TO YOUR REFERRING PHYSICIAN.  Exam and discussion by [name], PA-C Will increase Morphine frequency by 1 pill every 24 hour, continue oxycodone, add flexeril as a muscle relaxant and Temazepam for sleep. Will get you schedule for CTscans of chest, abdomen and pelvis and bone scan to see how things are going. Flu vaccine today. Continue Ibrance and letrozole Continue xgeva.  Follow-up: Labs and xgeva in 2 weeks Scans before office visit in 6 weeks.  Thank you for choosing [place] at [facility] to provide your oncology and hematology care.  To afford each patient quality time with our provider, please arrive at least 15 minutes before your scheduled appointment time.    You need to re-schedule your appointment should you arrive 10 or more minutes late.  We strive to give you quality time with our providers, and arriving late affects you and other patients whose appointments are after yours.  Also, if you no show three or more times for appointments you may be dismissed from the clinic at the providers discretion.     Again, thank you for choosing [facility].  Our hope is that these requests will decrease the amount of time that you wait before being seen by our physicians.       _____________________________________________________________  Should you have questions after your visit to [facility], please contact our office at [phone] between the hours of 8:30 a.m. and 4:30 p.m.  Voicemails left after 4:30 p.m. will not be returned until the following business day.  For prescription refill requests, have your pharmacy contact our office.

## 2015-02-24 NOTE — Progress Notes (Signed)
Brittany Blair presents today for injection per MD orders. Flu vaccine administered IM in right Deltoid Administration without incident. Patient tolerated well.

## 2015-02-27 MED FILL — CYCLOBENZAPRINE 10 MG TAB: 10 | 10 days supply | Qty: 30 | Fill #0

## 2015-02-27 MED FILL — TEMAZEPAM 15 MG CAPSULE: 15 | 30 days supply | Qty: 60 | Fill #0

## 2015-02-27 MED FILL — MORPHINE SULF 60 MG TAB SA: 60 | 30 days supply | Qty: 150 | Fill #0

## 2015-02-28 ENCOUNTER — Other Ambulatory Visit (HOSPITAL_COMMUNITY): Payer: Self-pay

## 2015-02-28 DIAGNOSIS — C50919 Malignant neoplasm of unspecified site of unspecified female breast: Secondary | ICD-10-CM

## 2015-02-28 DIAGNOSIS — C7951 Secondary malignant neoplasm of bone: Principal | ICD-10-CM

## 2015-03-09 MED FILL — ACYCLOVIR 400 MG TABLET: 400 | 30 days supply | Qty: 60 | Fill #3

## 2015-03-10 ENCOUNTER — Encounter (HOSPITAL_COMMUNITY): Payer: Self-pay | Attending: Oncology

## 2015-03-10 ENCOUNTER — Encounter (HOSPITAL_COMMUNITY): Payer: Self-pay

## 2015-03-10 ENCOUNTER — Telehealth (HOSPITAL_COMMUNITY): Payer: Self-pay | Admitting: Emergency Medicine

## 2015-03-10 VITALS — BP 121/44 | HR 90 | Temp 98.6°F | Resp 20

## 2015-03-10 DIAGNOSIS — C7951 Secondary malignant neoplasm of bone: Secondary | ICD-10-CM

## 2015-03-10 DIAGNOSIS — F411 Generalized anxiety disorder: Secondary | ICD-10-CM | POA: Insufficient documentation

## 2015-03-10 DIAGNOSIS — C50919 Malignant neoplasm of unspecified site of unspecified female breast: Secondary | ICD-10-CM

## 2015-03-10 DIAGNOSIS — R978 Other abnormal tumor markers: Secondary | ICD-10-CM | POA: Insufficient documentation

## 2015-03-10 DIAGNOSIS — C50312 Malignant neoplasm of lower-inner quadrant of left female breast: Secondary | ICD-10-CM | POA: Insufficient documentation

## 2015-03-10 DIAGNOSIS — R938 Abnormal findings on diagnostic imaging of other specified body structures: Secondary | ICD-10-CM | POA: Insufficient documentation

## 2015-03-10 LAB — CBC WITH DIFFERENTIAL/PLATELET
BASOS ABS: 0 10*3/uL (ref 0.0–0.1)
Basophils Relative: 1 %
Eosinophils Absolute: 0.2 10*3/uL (ref 0.0–0.7)
Eosinophils Relative: 4 %
HEMATOCRIT: 34.9 % — AB (ref 36.0–46.0)
HEMOGLOBIN: 11.9 g/dL — AB (ref 12.0–15.0)
LYMPHS PCT: 29 %
Lymphs Abs: 1.4 10*3/uL (ref 0.7–4.0)
MCH: 35.8 pg — ABNORMAL HIGH (ref 26.0–34.0)
MCHC: 34.1 g/dL (ref 30.0–36.0)
MCV: 105.1 fL — AB (ref 78.0–100.0)
MONO ABS: 0.3 10*3/uL (ref 0.1–1.0)
Monocytes Relative: 7 %
NEUTROS ABS: 3 10*3/uL (ref 1.7–7.7)
NEUTROS PCT: 59 %
Platelets: 416 10*3/uL — ABNORMAL HIGH (ref 150–400)
RBC: 3.32 MIL/uL — AB (ref 3.87–5.11)
RDW: 13.9 % (ref 11.5–15.5)
WBC: 5 10*3/uL (ref 4.0–10.5)

## 2015-03-10 LAB — COMPREHENSIVE METABOLIC PANEL
ALK PHOS: 87 U/L (ref 38–126)
ALT: 49 U/L (ref 14–54)
AST: 44 U/L — AB (ref 15–41)
Albumin: 4.2 g/dL (ref 3.5–5.0)
Anion gap: 8 (ref 5–15)
BILIRUBIN TOTAL: 0.6 mg/dL (ref 0.3–1.2)
BUN: 15 mg/dL (ref 6–20)
CO2: 27 mmol/L (ref 22–32)
CREATININE: 1.09 mg/dL — AB (ref 0.44–1.00)
Calcium: 9.5 mg/dL (ref 8.9–10.3)
Chloride: 101 mmol/L (ref 101–111)
GFR calc Af Amer: >60 mL/min (ref >60)
GFR, EST NON AFRICAN AMERICAN: 56 mL/min — AB (ref >60)
GLUCOSE: 96 mg/dL (ref 65–99)
Potassium: 5 mmol/L (ref 3.5–5.1)
Sodium: 136 mmol/L (ref 135–145)
TOTAL PROTEIN: 8.1 g/dL (ref 6.5–8.1)

## 2015-03-10 MED ORDER — DENOSUMAB 120 MG/1.7ML ~~LOC~~ SOLN
120.0000 mg | Freq: Once | SUBCUTANEOUS | Status: AC
  Administered 2015-03-10: 120 mg via SUBCUTANEOUS
  Filled 2015-03-10: qty 1.7

## 2015-03-10 NOTE — Progress Notes (Signed)
Brittany Blair presents today for injection per MD orders. Xgeva 120mg  administered SQ in right Upper Arm. Administration without incident. Patient tolerated well.

## 2015-03-10 NOTE — Patient Instructions (Signed)
[place] at [facility] Discharge Instructions  RECOMMENDATIONS MADE BY THE CONSULTANT AND ANY TEST RESULTS WILL BE SENT TO YOUR REFERRING PHYSICIAN.  CMET results reviewed with Kirby Crigler PA-C Xgeva given per orders. Continue as scheduled  Thank you for choosing [place] at [facility] to provide your oncology and hematology care.  To afford each patient quality time with our provider, please arrive at least 15 minutes before your scheduled appointment time.    You need to re-schedule your appointment should you arrive 10 or more minutes late.  We strive to give you quality time with our providers, and arriving late affects you and other patients whose appointments are after yours.  Also, if you no show three or more times for appointments you may be dismissed from the clinic at the providers discretion.     Again, thank you for choosing [facility].  Our hope is that these requests will decrease the amount of time that you wait before being seen by our physicians.       _____________________________________________________________  Should you have questions after your visit to [facility], please contact our office at [phone] between the hours of 8:30 a.m. and 4:30 p.m.  Voicemails left after 4:30 p.m. will not be returned until the following business day.  For prescription refill requests, have your pharmacy contact our office.

## 2015-03-10 NOTE — Telephone Encounter (Signed)
-----   Message from Baird Cancer, PA-C sent at 03/10/2015  2:38 PM EST ----- I have reviewed all lab results which are normal or stable. Please inform the patient.

## 2015-03-10 NOTE — Telephone Encounter (Signed)
Notified pt of lab results 

## 2015-03-11 LAB — CANCER ANTIGEN 27.29: CA 27.29: 43.1 U/mL — AB (ref 0.0–38.6)

## 2015-03-16 MED FILL — LETROZOLE 2.5 MG TABLET: 2.5 | 90 days supply | Qty: 90 | Fill #1

## 2015-03-17 ENCOUNTER — Encounter (HOSPITAL_COMMUNITY): Payer: Self-pay

## 2015-03-17 ENCOUNTER — Encounter (HOSPITAL_COMMUNITY): Admission: RE | Admit: 2015-03-17 | Payer: Self-pay | Source: Ambulatory Visit

## 2015-03-20 ENCOUNTER — Other Ambulatory Visit (HOSPITAL_COMMUNITY): Payer: Self-pay | Admitting: Oncology

## 2015-03-20 MED FILL — LYRICA 75 MG CAPSULE: 75 | 30 days supply | Qty: 60 | Fill #0

## 2015-03-20 MED FILL — traZODone HCL 50 MG TABS: 50 | 30 days supply | Qty: 30 | Fill #4

## 2015-03-23 ENCOUNTER — Encounter (HOSPITAL_COMMUNITY): Payer: Self-pay

## 2015-03-23 ENCOUNTER — Encounter (HOSPITAL_COMMUNITY)
Admission: RE | Admit: 2015-03-23 | Discharge: 2015-03-23 | Disposition: A | Discharge status: Home / Self Care | Payer: Self-pay | Source: Ambulatory Visit | Attending: Oncology | Admitting: Oncology

## 2015-03-23 DIAGNOSIS — C7951 Secondary malignant neoplasm of bone: Secondary | ICD-10-CM | POA: Insufficient documentation

## 2015-03-23 DIAGNOSIS — C50919 Malignant neoplasm of unspecified site of unspecified female breast: Secondary | ICD-10-CM | POA: Insufficient documentation

## 2015-03-23 MED ORDER — TECHNETIUM TC 99M MEDRONATE IV KIT
25.0000 | PACK | Freq: Once | INTRAVENOUS | Status: AC | PRN
  Administered 2015-03-23: 25.4 via INTRAVENOUS

## 2015-03-23 MED FILL — ALPRAZolam 1 MG TABS: 1 | 30 days supply | Qty: 90 | Fill #4

## 2015-03-23 MED FILL — CYCLOBENZAPRINE 10 MG TAB: 10 | 10 days supply | Qty: 30 | Fill #1

## 2015-03-28 ENCOUNTER — Other Ambulatory Visit (HOSPITAL_COMMUNITY): Payer: Self-pay | Admitting: Oncology

## 2015-03-28 DIAGNOSIS — G893 Neoplasm related pain (acute) (chronic): Secondary | ICD-10-CM

## 2015-03-28 DIAGNOSIS — C7951 Secondary malignant neoplasm of bone: Principal | ICD-10-CM

## 2015-03-28 DIAGNOSIS — C50919 Malignant neoplasm of unspecified site of unspecified female breast: Secondary | ICD-10-CM

## 2015-03-28 MED ORDER — MORPHINE SULFATE ER 60 MG PO TBCR: EXTENDED_RELEASE_TABLET | ORAL | Status: DC

## 2015-03-28 MED ORDER — OXYCODONE HCL 10 MG PO TABS: ORAL_TABLET | ORAL | Status: DC

## 2015-03-29 MED FILL — LISINOPRIL 40 MG TABLET: 40 | 30 days supply | Qty: 30 | Fill #1

## 2015-03-30 ENCOUNTER — Other Ambulatory Visit (HOSPITAL_COMMUNITY): Payer: Self-pay | Admitting: Oncology

## 2015-03-30 MED FILL — MORPHINE SULF 60 MG TAB SA: 60 | 30 days supply | Qty: 150 | Fill #0

## 2015-03-30 MED FILL — oxyCODONE HCL 10 MG TABS: 10 | 28 days supply | Qty: 150 | Fill #0

## 2015-03-30 MED FILL — CYCLOBENZAPRINE 10 MG TAB: 10 | 10 days supply | Qty: 30 | Fill #0

## 2015-03-31 ENCOUNTER — Ambulatory Visit (HOSPITAL_COMMUNITY)
Admission: RE | Admit: 2015-03-31 | Discharge: 2015-03-31 | Disposition: A | Discharge status: Home / Self Care | Payer: Self-pay | Source: Ambulatory Visit | Attending: Oncology | Admitting: Oncology

## 2015-03-31 DIAGNOSIS — I890 Lymphedema, not elsewhere classified: Secondary | ICD-10-CM | POA: Insufficient documentation

## 2015-03-31 DIAGNOSIS — I7090 Unspecified atherosclerosis: Secondary | ICD-10-CM | POA: Insufficient documentation

## 2015-03-31 DIAGNOSIS — C7951 Secondary malignant neoplasm of bone: Secondary | ICD-10-CM | POA: Insufficient documentation

## 2015-03-31 DIAGNOSIS — Z9012 Acquired absence of left breast and nipple: Secondary | ICD-10-CM | POA: Insufficient documentation

## 2015-03-31 DIAGNOSIS — C50919 Malignant neoplasm of unspecified site of unspecified female breast: Secondary | ICD-10-CM | POA: Insufficient documentation

## 2015-03-31 DIAGNOSIS — K760 Fatty (change of) liver, not elsewhere classified: Secondary | ICD-10-CM | POA: Insufficient documentation

## 2015-03-31 MED ORDER — IOHEXOL 300 MG/ML  SOLN
100.0000 mL | Freq: Once | INTRAMUSCULAR | Status: AC | PRN
  Administered 2015-03-31: 100 mL via INTRAVENOUS

## 2015-04-03 ENCOUNTER — Other Ambulatory Visit (HOSPITAL_COMMUNITY): Payer: Self-pay | Admitting: Oncology

## 2015-04-03 MED FILL — VENLAFAXINE HCL ER 75 MG CA: 75 | 30 days supply | Qty: 90 | Fill #0

## 2015-04-03 MED FILL — ACYCLOVIR 400 MG TABLET: 400 | 30 days supply | Qty: 60 | Fill #4

## 2015-04-07 ENCOUNTER — Encounter (HOSPITAL_COMMUNITY): Payer: Self-pay | Admitting: Hematology & Oncology

## 2015-04-07 ENCOUNTER — Encounter (HOSPITAL_COMMUNITY): Payer: Self-pay

## 2015-04-07 ENCOUNTER — Encounter (HOSPITAL_COMMUNITY): Payer: Self-pay | Attending: Oncology | Admitting: Hematology & Oncology

## 2015-04-07 ENCOUNTER — Telehealth (HOSPITAL_COMMUNITY): Payer: Self-pay | Admitting: *Deleted

## 2015-04-07 ENCOUNTER — Ambulatory Visit (HOSPITAL_COMMUNITY)
Admission: RE | Admit: 2015-04-07 | Discharge: 2015-04-07 | Disposition: A | Discharge status: Home / Self Care | Payer: Self-pay | Source: Ambulatory Visit | Attending: Hematology & Oncology | Admitting: Hematology & Oncology

## 2015-04-07 VITALS — BP 123/58 | HR 103 | Temp 98.1°F | Resp 18 | Wt 245.0 lb

## 2015-04-07 DIAGNOSIS — F411 Generalized anxiety disorder: Secondary | ICD-10-CM | POA: Insufficient documentation

## 2015-04-07 DIAGNOSIS — H44003 Unspecified purulent endophthalmitis, bilateral: Secondary | ICD-10-CM

## 2015-04-07 DIAGNOSIS — C7951 Secondary malignant neoplasm of bone: Principal | ICD-10-CM

## 2015-04-07 DIAGNOSIS — IMO0002 Reserved for concepts with insufficient information to code with codable children: Secondary | ICD-10-CM

## 2015-04-07 DIAGNOSIS — M79641 Pain in right hand: Secondary | ICD-10-CM

## 2015-04-07 DIAGNOSIS — C50312 Malignant neoplasm of lower-inner quadrant of left female breast: Secondary | ICD-10-CM | POA: Insufficient documentation

## 2015-04-07 DIAGNOSIS — G893 Neoplasm related pain (acute) (chronic): Secondary | ICD-10-CM

## 2015-04-07 DIAGNOSIS — C50919 Malignant neoplasm of unspecified site of unspecified female breast: Secondary | ICD-10-CM

## 2015-04-07 DIAGNOSIS — K760 Fatty (change of) liver, not elsewhere classified: Secondary | ICD-10-CM

## 2015-04-07 DIAGNOSIS — R978 Other abnormal tumor markers: Secondary | ICD-10-CM | POA: Insufficient documentation

## 2015-04-07 DIAGNOSIS — R938 Abnormal findings on diagnostic imaging of other specified body structures: Secondary | ICD-10-CM | POA: Insufficient documentation

## 2015-04-07 DIAGNOSIS — Z720 Tobacco use: Secondary | ICD-10-CM

## 2015-04-07 DIAGNOSIS — M7989 Other specified soft tissue disorders: Secondary | ICD-10-CM | POA: Insufficient documentation

## 2015-04-07 LAB — COMPREHENSIVE METABOLIC PANEL
ALBUMIN: 3.7 g/dL (ref 3.5–5.0)
ALT: 41 U/L (ref 14–54)
AST: 42 U/L — AB (ref 15–41)
Alkaline Phosphatase: 94 U/L (ref 38–126)
Anion gap: 10 (ref 5–15)
BILIRUBIN TOTAL: 0.4 mg/dL (ref 0.3–1.2)
BUN: 18 mg/dL (ref 6–20)
CO2: 23 mmol/L (ref 22–32)
Calcium: 8.8 mg/dL — ABNORMAL LOW (ref 8.9–10.3)
Chloride: 99 mmol/L — ABNORMAL LOW (ref 101–111)
Creatinine, Ser: 1 mg/dL (ref 0.44–1.00)
GFR calc Af Amer: >60 mL/min (ref >60)
GFR calc non Af Amer: >60 mL/min (ref >60)
GLUCOSE: 128 mg/dL — AB (ref 65–99)
POTASSIUM: 4.3 mmol/L (ref 3.5–5.1)
Sodium: 132 mmol/L — ABNORMAL LOW (ref 135–145)
TOTAL PROTEIN: 7.3 g/dL (ref 6.5–8.1)

## 2015-04-07 LAB — CBC WITH DIFFERENTIAL/PLATELET
BASOS ABS: 0.1 10*3/uL (ref 0.0–0.1)
BASOS PCT: 1 %
EOS ABS: 0.2 10*3/uL (ref 0.0–0.7)
EOS PCT: 3 %
HCT: 33.4 % — ABNORMAL LOW (ref 36.0–46.0)
Hemoglobin: 11.2 g/dL — ABNORMAL LOW (ref 12.0–15.0)
LYMPHS PCT: 23 %
Lymphs Abs: 1.3 10*3/uL (ref 0.7–4.0)
MCH: 34.6 pg — ABNORMAL HIGH (ref 26.0–34.0)
MCHC: 33.5 g/dL (ref 30.0–36.0)
MCV: 103.1 fL — ABNORMAL HIGH (ref 78.0–100.0)
Monocytes Absolute: 0.5 10*3/uL (ref 0.1–1.0)
Monocytes Relative: 9 %
Neutro Abs: 3.7 10*3/uL (ref 1.7–7.7)
Neutrophils Relative %: 64 %
PLATELETS: 315 10*3/uL (ref 150–400)
RBC: 3.24 MIL/uL — AB (ref 3.87–5.11)
RDW: 13.9 % (ref 11.5–15.5)
WBC: 5.7 10*3/uL (ref 4.0–10.5)

## 2015-04-07 MED ORDER — BACITRACIN-POLYMYXIN B 500-10000 UNIT/GM OP OINT: 1.0000 "application " | TOPICAL_OINTMENT | Freq: Three times a day (TID) | OPHTHALMIC | Status: DC

## 2015-04-07 MED ORDER — CYCLOBENZAPRINE HCL 10 MG PO TABS: 10.0000 mg | ORAL_TABLET | Freq: Three times a day (TID) | ORAL | Status: DC

## 2015-04-07 MED FILL — CYCLOBENZAPRINE 10 MG TAB: 10 | 30 days supply | Qty: 90 | Fill #0

## 2015-04-07 MED FILL — BACITRACIN-POLYMYXIN EYE OI: 500-10000 | 10 days supply | Qty: 4 | Fill #0

## 2015-04-07 NOTE — Telephone Encounter (Signed)
Created in error

## 2015-04-07 NOTE — Progress Notes (Signed)
[facility] NOTE  No PCP Per Patient No address on file  Stage IV breast cancer, ER+, PR+ Her-2 neu-   CURRENT THERAPY: Ibrance plus letrozole will be given with ibrance 100 mg daily for 3 weeks out of every 4 and letrozole 2.5 mg daily. Xgeva beginning on 01/17/2014.  INTERVAL HISTORY: Brittany Blair 55 y.o. female returns for followup of Metastatic breast cancer to bone after undergoing an iliac bone biopsy on 12/08/2013 demonstrating an ER/PR +, Her2 negative breast cancer. She was started on Ibrance plus letrozole will be given with ibrance 100 mg daily for 3 weeks out of every 4 and letrozole 2.5 mg daily.   Brittany Blair returns to the [facility] today accompanied by her husband.  When asked what she's been doing, she says "gainingg weight." She is also wearing a brace on her right hand, explained by the fact that they have three dogs, one of which "landed on her hand" and "messed her hand up."  She had her scans on the 3rd, and they were unchanged. Everything was stable but her Calcium, which was a little low today.  She says when she had chemo before, all of her eyelashes fell out and the "lid gets infected," and she says recently "that's been the bane of my existence." "It wasn't so bad until the day after we were here to see Gershon Mussel, and then right the next day, all of the follicles got infected and swelled up." She says that, to treat this, she usually just keeps it clean. She adds that they would sometimes weep "green."  Her husband says that her recent prescription for muscle relaxer has been "the biggest help," and she can now get up and down the steps so much easier. She says "I wish we had done that a long time ago." He says that now she doesn't need as much help getting anywhere. She says that, before, she couldn't stand eight minutes without severe spasms in her lower back. Now she can stand long enough to do basic tasks like  make a sandwich, etc.  She says she is also not taking as much of the pain medication as she was before. She says "right now I couldn't be more satisfied with the things we've got."  She asks "tell me what you have in mind about activity level," and was advised to do whatever she feels she can do.  She notes that the day after she saw Tom, the next day she started spotting blood. She has had a hysterectomy.  She says that her bowels are okay, a little slow, "you know how it goes when you're on opiates."     Carcinoma of breast metastatic to bone ([place])   10/14/2013 Surgery laparoscopy assisted vaginal hysterectomy, laparoscopic bilateral salpingectomy, and cystoscopy    12/08/2013 Progression Iliac bone biopsy consistent with adenocarcinoma, ER/PR positive, HER-2/neu not overexpressed   12/23/2013 -  Chemotherapy Ibrance 100 mg daily for 3 weeks out of every 4 and letrozole 2.5 mg daily.   01/17/2014 Miscellaneous Xgeva 120 mg SQ every 28 days for bone metastases.   09/16/2014 Imaging Stable exam. Osseous metastatic disease is seen on the mostbrecent comparison study without substantial interval change.  No evidence for soft tissue metastases in the chest, abdomen, or pelvis.   09/22/2014 Imaging Bone scan- Stable abnormal uptake the medial aspect of the left clavicle, lateral aspects of 2 mid left ribs, and in  the medial aspect of the left iliac bone. Slightly increased conspicuity of activity in the left inferior pubic ramus with new increase...   Oncologically, she denies any complaints and ROS questioning is negative.   Past Medical History  Diagnosis Date  . Anxiety   . Herpes   . MRSA (methicillin resistant Staphylococcus aureus)   . Breast lump     left  . Hypertension   . Hematoma   . Swelling     left foot  . Multiple blisters     along surgical site  . Depression   . Genital herpes 08/04/2012  . PMB (postmenopausal bleeding) 06/23/2013  . Lymph edema   . Vaginal delivery  1981, 1984, 1987, 1992  . Breast cancer ([place]) dx'd 05/27/2010    chemo/xrt comp 01/2011  . Carcinoma of breast metastatic to bone ([place]) 05/28/2010    This patient presented in April with an invasive ductal carcinoma that had broken through the skin and was contaminated with MRSA. She was stage III. It was lower inner quadrant. It was receptor positive, HER-2/neu negative, and had a KI-67 of 75%.  She was treated with neoadjuvant chemotherapy. She was recently hospitalized (June, 2012,) with multiple superficial MRSA infections. These have all r    has Carcinoma of breast metastatic to bone [facility]); Syncope; GAD (generalized anxiety disorder); HTN (hypertension), benign; Chronic chest pain; Depression; Transaminitis; Genital herpes; PMB (postmenopausal bleeding); Acute blood loss anemia; Abdominal pain; AKI (acute kidney injury) ([place]); GIB (gastrointestinal bleeding); Syncope and collapse; Prepyloric ulcer; Left sided numbness; Hypotension; Hypocalcemia; Infiltrating ductal carcinoma of left breast ([place]); and Cancer related pain on her problem list.     is allergic to bee venom.  Current Outpatient Prescriptions on File Prior to Visit  Medication Sig Dispense Refill  . acyclovir (ZOVIRAX) 400 MG tablet TAKE 1 TABLET BY MOUTH TWICE DAILY 60 tablet 5  . ALPRAZolam (XANAX) 1 MG tablet Take 1 tablet (1 mg total) by mouth 3 (three) times daily as needed. 90 tablet 5  . amLODipine (NORVASC) 5 MG tablet TAKE 1 TABLET BY MOUTH TWICE DAILY 180 tablet 4  . calcium-vitamin D (OSCAL WITH D) 500-200 MG-UNIT per tablet Take 2 tablets by mouth daily with breakfast. 60 tablet 11  . clotrimazole (LOTRIMIN) 1 % cream Apply 1 application topically 2 (two) times daily as needed (yeast rash).    Marland Kitchen letrozole (FEMARA) 2.5 MG tablet Take 1 tablet (2.5 mg total) by mouth daily. 90 tablet 3  . lisinopril (PRINIVIL,ZESTRIL) 40 MG tablet Take 1 tablet (40 mg total) by mouth daily. 30 tablet 3  . loratadine (CLARITIN) 10 MG tablet  Take 10 mg by mouth daily as needed for allergies.     Marland Kitchen LYRICA 75 MG capsule TAKE 2 CAPSULES BY MOUTH ONCE DAILY 60 capsule 3  . metoprolol succinate (TOPROL-XL) 25 MG 24 hr tablet TAKE 3 TABLETS BY MOUTH ONCE DAILY WITH OR IMMEDIATELY FOLLOWING A MEAL 90 tablet 2  . morphine (MS CONTIN) 60 MG 12 hr tablet Take 2 tablet in AM, take 1 tablet in afternoon, take 2 tablets in evening 150 tablet 0  . OVER THE COUNTER MEDICATION Take 1 oz by mouth 2 (two) times daily. Liquid calcium with vitamin D    . Oxycodone HCl 10 MG TABS Take 1 or 2 tablets every 4 hours to control pain. 150 tablet 0  . palbociclib (IBRANCE) 100 MG capsule Take 1 capsule daily with food for 21 days, rest 7 days, then resume taking  the medicine 3 weeks on, 1 week off. 21 capsule 6  . polyethylene glycol (MIRALAX / GLYCOLAX) packet Take 8.5 g by mouth daily as needed for moderate constipation.    . promethazine (PHENERGAN) 25 MG tablet Take 1 tablet (25 mg total) by mouth every 6 (six) hours as needed for nausea or vomiting. 30 tablet 6  . temazepam (RESTORIL) 15 MG capsule Take 1-2 capsules (15-30 mg total) by mouth at bedtime as needed for sleep. 60 capsule 3  . tetrahydrozoline-zinc (VISINE-AC) 0.05-0.25 % ophthalmic solution Place 2 drops into both eyes 3 (three) times daily as needed (for allergies).     . traZODone (DESYREL) 50 MG tablet TAKE 1 TABLET BY MOUTH AT BEDTIME. 30 tablet 6  . venlafaxine XR (EFFEXOR-XR) 75 MG 24 hr capsule TAKE 3 CAPSULES BY MOUTH DAILY 90 capsule 2  . [DISCONTINUED] FLUoxetine (PROZAC) 20 MG tablet Take 40 mg by mouth daily.       No current facility-administered medications on file prior to visit.     Past Surgical History  Procedure Laterality Date  . Tubal ligation    . Wisdom tooth extraction    . Mastectomy modified radical  10/10/10     left -Dr Margot Chimes  . Laparoscopic assisted vaginal hysterectomy Bilateral 10/14/2013    Procedure: LAPAROSCOPIC ASSISTED VAGINAL HYSTERECTOMY;  Surgeon:  Ena Dawley, MD;  Location: [facility];  Service: Gynecology;  Laterality: Bilateral;  . Cystoscopy N/A 10/14/2013    Procedure: CYSTOSCOPY;  Surgeon: Ena Dawley, MD;  Location: [facility];  Service: Gynecology;  Laterality: N/A;  . Laparoscopic lysis of adhesions N/A 10/14/2013    Procedure: LAPAROSCOPIC LYSIS OF ADHESIONS;  Surgeon: Ena Dawley, MD;  Location: [facility];  Service: Gynecology;  Laterality: N/A;  . Laparoscopic bilateral salpingo oopherectomy Bilateral 10/14/2013    Procedure: LAPAROSCOPIC BILATERAL SALPINGO OOPHORECTOMY;  Surgeon: Ena Dawley, MD;  Location: [facility];  Service: Gynecology;  Laterality: Bilateral;  . Esophagogastroduodenoscopy N/A 10/27/2013    Procedure: ESOPHAGOGASTRODUODENOSCOPY (EGD);  Surgeon: Wonda Horner, MD;  Location: [facility];  Service: Endoscopy;  Laterality: N/A;  . Bone biopsy Left 11/2013    left post. hip    Denies any headaches, dizziness, double vision, fevers, chills, night sweats, nausea, vomiting, diarrhea, constipation, chest pain, heart palpitations, shortness of breath, blood in stool, black tarry stool, urinary pain, urinary burning, urinary frequency, hematuria.  14 point review of systems was performed and is negative except as detailed under history of present illness and above     PHYSICAL EXAMINATION  ECOG PERFORMANCE STATUS: 2 - Symptomatic, <50% confined to bed  Filed Vitals:   04/07/15 1117  BP: 123/58  Pulse: 103  Temp: 98.1 F (36.7 C)  Resp: 18    GENERAL:alert, no distress, well nourished, well developed, comfortable, cooperative, obese, smiling and accompanied by husband SKIN: skin color, texture, turgor are normal, no rashes or significant lesions HEAD: Normocephalic, No masses, lesions, tenderness or abnormalities EYES: normal, PERRLA, EOMI, Conjunctiva are pink and non-injected Mild "yellowish" discharge L eye EARS: External ears normal OROPHARYNX:lips, buccal mucosa, and tongue normal and mucous  membranes are moist  NECK: supple, no adenopathy, thyroid normal size, non-tender, without nodularity, no stridor, non-tender, trachea midline LYMPH:  no palpable lymphadenopathy, no hepatosplenomegaly BREAST:post-mastectomy site well healed and free of suspicious changes LUNGS: clear to auscultation and percussion HEART: regular rate & rhythm, no murmurs and no gallops ABDOMEN:abdomen soft, non-tender and normal bowel sounds BACK: Back symmetric, no curvature., No CVA tenderness EXTREMITIES:less then  2 second capillary refill, no joint deformities, effusion, or inflammation, positive findings:  edema left upper extremity lymphedema with woody infiltration She is wearing a wrist brace on the R, when removed, no swelling or bruising of the extremity is noted, she can move her fingers without difficulty NEURO: alert & oriented x 3 with fluent speech, no focal motor/sensory deficits, in wheelchair.   LABORATORY DATA: I have reviewed the data as listed.  Results for KYOMI, HECTOR (MRN [id number])   Ref. Range 04/07/2015 10:12  Sodium Latest Ref Range: 135-145 mmol/L 132 (L)  Potassium Latest Ref Range: 3.5-5.1 mmol/L 4.3  Chloride Latest Ref Range: 101-111 mmol/L 99 (L)  CO2 Latest Ref Range: 22-32 mmol/L 23  BUN Latest Ref Range: 6-20 mg/dL 18  Creatinine Latest Ref Range: 0.44-1.00 mg/dL 1.00  Calcium Latest Ref Range: 8.9-10.3 mg/dL 8.8 (L)  EGFR (Non-African Amer.) Latest Ref Range: >60 mL/min >60  EGFR (African American) Latest Ref Range: >60 mL/min >60  Glucose Latest Ref Range: 65-99 mg/dL 128 (H)  Anion gap Latest Ref Range: 5-15  10  Alkaline Phosphatase Latest Ref Range: 38-126 U/L 94  Albumin Latest Ref Range: 3.5-5.0 g/dL 3.7  AST Latest Ref Range: 15-41 U/L 42 (H)  ALT Latest Ref Range: 14-54 U/L 41  Total Protein Latest Ref Range: 6.5-8.1 g/dL 7.3  Total Bilirubin Latest Ref Range: 0.3-1.2 mg/dL 0.4  WBC Latest Ref Range: 4.0-10.5 K/uL 5.7  RBC Latest Ref Range: 3.87-5.11  MIL/uL 3.24 (L)  Hemoglobin Latest Ref Range: 12.0-15.0 g/dL 11.2 (L)  HCT Latest Ref Range: 36.0-46.0 % 33.4 (L)  MCV Latest Ref Range: 78.0-100.0 fL 103.1 (H)  MCH Latest Ref Range: 26.0-34.0 pg 34.6 (H)  MCHC Latest Ref Range: 30.0-36.0 g/dL 33.5  RDW Latest Ref Range: 11.5-15.5 % 13.9  Platelets Latest Ref Range: 150-400 K/uL 315  Neutrophils Latest Units: % 64  Lymphocytes Latest Units: % 23  Monocytes Relative Latest Units: % 9  Eosinophil Latest Units: % 3  Basophil Latest Units: % 1  NEUT# Latest Ref Range: 1.7-7.7 K/uL 3.7  Lymphocyte # Latest Ref Range: 0.7-4.0 K/uL 1.3  Monocyte # Latest Ref Range: 0.1-1.0 K/uL 0.5  Eosinophils Absolute Latest Ref Range: 0.0-0.7 K/uL 0.2  Basophils Absolute Latest Ref Range: 0.0-0.1 K/uL 0.1  CA 27.29 Latest Ref Range: 0.0-38.6 U/mL 44.0 (H)    Lab Results  Component Value Date   LABCA2 44.0* 04/07/2015     RADIOLOGY: CLINICAL DATA: 55 year old female with with history of stage IV breast cancer with metastatic disease to the bones.  EXAM: CT CHEST, ABDOMEN, AND PELVIS WITH CONTRAST  TECHNIQUE: Multidetector CT imaging of the chest, abdomen and pelvis was performed following the standard protocol during bolus administration of intravenous contrast.  CONTRAST: 124m OMNIPAQUE IOHEXOL 300 MG/ML SOLN  COMPARISON: CT of the chest, abdomen and pelvis 09/16/2014.  FINDINGS: CT CHEST FINDINGS  Mediastinum/Lymph Nodes: Heart size is normal. There is no significant pericardial fluid, thickening or pericardial calcification. No pathologically enlarged mediastinal, internal mammary or hilar lymph nodes. Esophagus is unremarkable in appearance. No axillary lymphadenopathy. Surgical clips in the left axilla compatible with prior lymph node dissection.  Lungs/Pleura: No suspicious appearing pulmonary nodules or masses. No acute consolidative airspace disease. No pleural effusions. Tiny 2 mm subpleural nodule in the  periphery of the right lower lobe is unchanged, presumably a subpleural lymph node. Subpleural reticulation and ground-glass attenuation in the apex of the left upper lobe is unchanged, presumably related to prior left axillary radiation therapy.  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 Sclerotic lesion in the right side of the manubrium it is also unchanged. No new suspicious osseous lesions are noted. Diffuse soft tissue edema throughout the visualized portions of the left upper extremity, similar to prior examination, likely to reflect a lymphedema.  CT ABDOMEN AND PELVIS FINDINGS  Hepatobiliary: Diffuse low attenuation throughout the hepatic parenchyma, compatible with hepatic steatosis. No suspicious cystic or solid hepatic lesions are noted. No intra or extrahepatic biliary ductal dilatation. Gallbladder is normal in appearance.  Pancreas: No pancreatic mass. No pancreatic ductal dilatation. No pancreatic or peripancreatic fluid or inflammatory changes.  Spleen: Unremarkable.  Adrenals/Urinary Tract: Bilateral adrenal glands and bilateral kidneys are normal in appearance. No hydroureteronephrosis. Urinary bladder is normal in appearance.  Stomach/Bowel: Normal appearance of the stomach. No pathologic dilatation of small bowel or colon. The appendix is not confidently identified may be surgically absent. Regardless, there are no inflammatory changes noted adjacent to the cecum to suggest presence of an acute appendicitis at this time.  Vascular/Lymphatic: Atherosclerosis throughout the abdominal and pelvic vasculature, without evidence of aneurysm or dissection. No lymphadenopathy noted in the abdomen or pelvis.  Reproductive: Status post hysterectomy. Ovaries are not confidently identified may be surgically absent or atrophic.  Other: No significant volume  of ascites. No pneumoperitoneum.  Musculoskeletal: Multiple osseous lesions are again noted, most evident in the pelvis. The largest of these are mixed lytic and sclerotic lesions involving predominantly the left inferior pubic ramus and the left ilium. These appear grossly unchanged compared to the prior study. No new osseous lesions are noted.  IMPRESSION: 1. Unchanged skeletal metastatic disease, as discussed above. 2. No signs of new extraskeletal metastatic disease identified on today's examination in the chest, abdomen or pelvis. 3. Status post left modified radical mastectomy and left axillary nodal dissection, with lymphedema in the left arm, similar to the prior examination, as above. 4. Hepatic steatosis. 5. Atherosclerosis. 6. Additional incidental findings, as above.   Electronically Signed  By: Vinnie Langton M.D.  On: 03/31/2015 15:13    ASSESSMENT AND PLAN:  Stage IV ER+, PR+ Her-2 neu - breast cancer Ongoing Tobacco Use Cancer Related Pain Hepatic Steatosis Inactivity  She is to continue on her ibrance and femara. Scans were reviewed with the patient and her husband. Disease is stable. She has excellent tolerance and her disease is controlled clinically, by imaging and by "tumor marker" monitoring.  She is to continue with her current pain regimen as her pain is much better controlled than previously.   She is not interested in discontinuing smoking and we discussed the health benefits of smoking cessation. I addressed ways we could help her quit, but again the patient declines.  I have prescribed her an antibiotic ointment for her eyes. She is instructed on its use. If her eyes do not improved she is advised to see an opthamologist.  She needs a B12 and a folate added to her labs when she returns. She knows to take her Calcium.  Her husband confirms that he just got refills on a few of her medications, including Acyclovir. I have written for a month  of flexeril. Her husband says he would like an x-ray of her wrist today. She will be apprised of the results when available.  She will return in one month for ongoing follow-up.  Orders Placed This Encounter  Procedures  . DG Hand Complete Right  Add on, esigned, nbs    Standing Status: Future     Number of Occurrences: 1     Standing Expiration Date: 06/04/2016    Order Specific Question:  Reason for Exam (SYMPTOM  OR DIAGNOSIS REQUIRED)    Answer:  right hand pain    Order Specific Question:  Is the patient pregnant?    Answer:  No    Order Specific Question:  Preferred imaging location?    Answer:  [facility]  . CBC with Differential    Standing Status: Future     Number of Occurrences:      Standing Expiration Date: 04/06/2016  . Comprehensive metabolic panel    Standing Status: Future     Number of Occurrences:      Standing Expiration Date: 04/06/2016  . Vitamin B12    Standing Status: Future     Number of Occurrences:      Standing Expiration Date: 04/06/2016  . Folate    Standing Status: Future     Number of Occurrences:      Standing Expiration Date: 04/06/2016  . Cancer antigen 27.29    Standing Status: Future     Number of Occurrences:      Standing Expiration Date: 04/06/2016   All questions were answered. The patient knows to call the clinic with any problems, questions or concerns. We can certainly see the patient much sooner if necessary.  This document serves as a record of services personally performed by Ancil Linsey, MD. It was created on her behalf by Toni Amend, a trained medical scribe. The creation of this record is based on the scribe's personal observations and the provider's statements to them. This document has been checked and approved by the attending provider.  I have reviewed the above documentation for accuracy and completeness, and I agree with the above.  Kelby Fam. Penland MD

## 2015-04-07 NOTE — Patient Instructions (Addendum)
..  [place] [facility] Discharge Instructions  RECOMMENDATIONS MADE BY THE CONSULTANT AND ANY TEST RESULTS WILL BE SENT TO YOUR REFERRING PHYSICIAN.  Xgeva pushes calcium into the bones, therefore it is important to take your calcium We will hold xgeva today due to low calcium Flexeril refill for 3 x a day # 90 Bacitracin ointment, 1 inch to each eye 3 x day Cancer is sleeping! Do whatever you feel like you can do  The vaginal bleeding could be from vaginal atrophy Let us know if it happens again  We will get xray of your hands to check this out-go by on your way out today   Thank you for choosing [place] at [facility] to provide your oncology and hematology care.  To afford each patient quality time with our provider, please arrive at least 15 minutes before your scheduled appointment time.   Beginning January 23rd 2017 lab work for the [facility] will be done in the  Main lab at [facility] on 1st floor. If you have a lab appointment with the [facility] please come in thru the  Main Entrance and check in at the main information desk  You need to re-schedule your appointment should you arrive 10 or more minutes late.  We strive to give you quality time with our providers, and arriving late affects you and other patients whose appointments are after yours.  Also, if you no show three or more times for appointments you may be dismissed from the clinic at the providers discretion.     Again, thank you for choosing [facility].  Our hope is that these requests will decrease the amount of time that you wait before being seen by our physicians.       _____________________________________________________________  Should you have questions after your visit to [facility], please contact our office at [phone] between the hours of 8:30 a.m. and 4:30 p.m.  Voicemails left after 4:30 p.m. will not be  returned until the following business day.  For prescription refill requests, have your pharmacy contact our office.

## 2015-04-07 NOTE — Progress Notes (Signed)
Per Merry Lofty patient will not get Xgeva injection today.

## 2015-04-08 LAB — CANCER ANTIGEN 27.29: CA 27.29: 44 U/mL — AB (ref 0.0–38.6)

## 2015-04-10 MED FILL — TEMAZEPAM 15 MG CAPSULE: 15 | 30 days supply | Qty: 60 | Fill #1

## 2015-04-20 MED FILL — traZODone HCL 50 MG TABS: 50 | 30 days supply | Qty: 30 | Fill #5

## 2015-04-20 MED FILL — LYRICA 75 MG CAPSULE: 75 | 30 days supply | Qty: 60 | Fill #1

## 2015-04-22 DIAGNOSIS — IMO0002 Reserved for concepts with insufficient information to code with codable children: Secondary | ICD-10-CM | POA: Insufficient documentation

## 2015-04-26 ENCOUNTER — Other Ambulatory Visit (HOSPITAL_COMMUNITY): Payer: Self-pay | Admitting: Oncology

## 2015-04-26 DIAGNOSIS — C7951 Secondary malignant neoplasm of bone: Principal | ICD-10-CM

## 2015-04-26 DIAGNOSIS — G893 Neoplasm related pain (acute) (chronic): Secondary | ICD-10-CM

## 2015-04-26 DIAGNOSIS — C50919 Malignant neoplasm of unspecified site of unspecified female breast: Secondary | ICD-10-CM

## 2015-04-26 MED ORDER — OXYCODONE HCL 10 MG PO TABS: ORAL_TABLET | ORAL | Status: DC

## 2015-04-26 MED ORDER — MORPHINE SULFATE ER 60 MG PO TBCR: EXTENDED_RELEASE_TABLET | ORAL | Status: DC

## 2015-04-28 MED FILL — oxyCODONE HCL 10 MG TABS: 10 | 12 days supply | Qty: 150 | Fill #0

## 2015-04-28 MED FILL — MORPHINE SULF 60 MG TAB SA: 60 | 30 days supply | Qty: 150 | Fill #0

## 2015-04-28 MED FILL — LISINOPRIL 40 MG TABLET: 40 | 30 days supply | Qty: 30 | Fill #2

## 2015-05-05 ENCOUNTER — Other Ambulatory Visit (HOSPITAL_COMMUNITY): Payer: Self-pay

## 2015-05-05 ENCOUNTER — Ambulatory Visit (HOSPITAL_COMMUNITY): Payer: Self-pay

## 2015-05-05 ENCOUNTER — Ambulatory Visit (HOSPITAL_COMMUNITY): Payer: Self-pay | Admitting: Oncology

## 2015-05-08 ENCOUNTER — Other Ambulatory Visit (HOSPITAL_COMMUNITY): Payer: Self-pay | Admitting: Oncology

## 2015-05-08 MED FILL — ACYCLOVIR 400 MG TABLET: 400 | 30 days supply | Qty: 60 | Fill #5

## 2015-05-09 MED FILL — METOPROLOL SUCC ER 25 MG TA: 25 | 30 days supply | Qty: 90 | Fill #0

## 2015-05-10 ENCOUNTER — Other Ambulatory Visit (HOSPITAL_COMMUNITY): Payer: Self-pay | Admitting: Oncology

## 2015-05-10 MED FILL — CYCLOBENZAPRINE 10 MG TAB: 10 | 30 days supply | Qty: 90 | Fill #1

## 2015-05-11 NOTE — Assessment & Plan Note (Addendum)
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  Tolerating well thus far.  On Xgeva therapy for bone metastases with Ca++ and Vit D to prevent hypocalcemia associated with Xgeva.    Labs today as ordered: CBC diff, CMET, Vitamin B12, Folate, CA 27.29  Labs in 4 and 8 weeks: CBC diff, CMET, CA 27.29  Continue treatment as planned and outlined above.    Calcium today is WNL and therefore, she will receive Xgeva.  She would like her Flexeril change to as needed dosing and therefore I sent a new prescription for 3 times daily as needed.  The need a refill on Xanax and I printed this prescription on 05/09/2015 requesting nursing to call this medication into [facility].  I called the pharmacy and they have not received a prescription for Xanax and therefore I have called in a refill of Xanax 5.  I suspect she strained/sprained a ligament/tendon/muscle in her right hand.  She is encouraged to perform range of motion exercises. X-ray that was performed last month was unimpressive. She is plenty of pain medication at home and therefore I have not adjusted any of these.  Her disease is stable.  I think it is reasonable to continue to monitor her labs with Xgeva monthly and see her back for follow-up in 2 months.

## 2015-05-11 NOTE — Progress Notes (Addendum)
No PCP Per Patient No address on file  Carcinoma of breast metastatic to bone, unspecified laterality ([place]) - Plan: CBC with Differential, Comprehensive metabolic panel, Cancer antigen 27.29, cyclobenzaprine (FLEXERIL) 10 MG tablet, ALPRAZolam (XANAX) 1 MG tablet  B12 deficiency - Plan: Anti-parietal antibody, Intrinsic Factor Antibodies  CURRENT THERAPY: Ibrance 100 mg daily 3/1 fashion plus letrozole daily. Xgeva beginning on 01/17/2014.  INTERVAL HISTORY: Brittany Blair 55 y.o. female returns for followup of Metastatic breast cancer to bone after undergoing an iliac bone biopsy on 12/08/2013 demonstrating an ER/PR +, Her2 negative breast cancer. She was started on Ibrance plus letrozole will be given with ibrance 100 mg daily for 3 weeks out of every 4 and letrozole 2.5 mg daily.     Carcinoma of breast metastatic to bone ([place])   10/14/2013 Surgery laparoscopy assisted vaginal hysterectomy, laparoscopic bilateral salpingectomy, and cystoscopy    12/08/2013 Progression Iliac bone biopsy consistent with adenocarcinoma, ER/PR positive, HER-2/neu not overexpressed   12/23/2013 -  Chemotherapy Ibrance 100 mg daily for 3 weeks out of every 4 and letrozole 2.5 mg daily.   01/17/2014 Miscellaneous Xgeva 120 mg SQ every 28 days for bone metastases.   09/16/2014 Imaging Stable exam. Osseous metastatic disease is seen on the mostbrecent comparison study without substantial interval change.  No evidence for soft tissue metastases in the chest, abdomen, or pelvis.   09/22/2014 Imaging Bone scan- Stable abnormal uptake the medial aspect of the left clavicle, lateral aspects of 2 mid left ribs, and in the medial aspect of the left iliac bone. Slightly increased conspicuity of activity in the left inferior pubic ramus with new increase...   03/23/2015 Imaging Bone scan- Findings are stable compared to the nuclear medicine bone scan of 09/22/2014, as detailed above, compatible with the osseous  metastases described on previous CT reports.  No new abnormality.   03/31/2015 Imaging CT CAP- Unchanged skeletal metastatic disease, as discussed above.  No signs of new extraskeletal metastatic disease identified on today's examination in the chest, abdomen or pelvis.    I personally reviewed and went over laboratory results with the patient.  The results are noted within this dictation.  Labs will be updated today.Calcium today is within normal limits. Leukopenia is noted and we will follow closely. ANC is within normal limits. Anemia is at baseline. Macrocytosis is appreciated and folate and B-12 testing is pending at this time.  I personally reviewed and went over radiographic studies with the patient.  The results are noted within this dictation.  X-ray of right hand/wrist is negative for any soft tissue or bony abnormalities.  She reports that muscle relaxant has done wonders for her discomfort. She notes that she is able to stand longer and be on her feet more at home. They request when necessary dosing for Flexeril and I called in a new prescription to her pharmacy for 3 times a day when necessary.  They request another copy of advanced directives forms and this is provided today.  The patient's husband reports that there dog (great Dane/German Sheppard mix) has eaten 2 bars of soap in addition to a third of a bottle of maple syrup and a third of the patient's calcium pills.    Brittany Blair reports compliance with her calcium.   Past Medical History  Diagnosis Date  . Anxiety   . Herpes   . MRSA (methicillin resistant Staphylococcus aureus)   . Breast lump     left  . Hypertension   .  Hematoma   . Swelling     left foot  . Multiple blisters     along surgical site  . Depression   . Genital herpes 08/04/2012  . PMB (postmenopausal bleeding) 06/23/2013  . Lymph edema   . Vaginal delivery 1981, 1984, 1987, 1992  . Breast cancer ([place]) dx'd 05/27/2010    chemo/xrt comp 01/2011  .  Carcinoma of breast metastatic to bone ([place]) 05/28/2010    This patient presented in April with an invasive ductal carcinoma that had broken through the skin and was contaminated with MRSA. She was stage III. It was lower inner quadrant. It was receptor positive, HER-2/neu negative, and had a KI-67 of 75%.  She was treated with neoadjuvant chemotherapy. She was recently hospitalized (June, 2012,) with multiple superficial MRSA infections. These have all r  . B12 deficiency 05/12/2015    has Carcinoma of breast metastatic to bone [facility]); Syncope; GAD (generalized anxiety disorder); HTN (hypertension), benign; Chronic chest pain; Depression; Transaminitis; Genital herpes; PMB (postmenopausal bleeding); Acute blood loss anemia; Abdominal pain; AKI (acute kidney injury) ([place]); GIB (gastrointestinal bleeding); Syncope and collapse; Prepyloric ulcer; Left sided numbness; Hypotension; Hypocalcemia; Infiltrating ductal carcinoma of left breast ([place]); Cancer related pain; H/O hysterectomy with oophorectomy; and B12 deficiency on her problem list.     is allergic to bee venom.  Current Outpatient Prescriptions on File Prior to Visit  Medication Sig Dispense Refill  . acyclovir (ZOVIRAX) 400 MG tablet TAKE 1 TABLET BY MOUTH TWICE DAILY 60 tablet 5  . amLODipine (NORVASC) 5 MG tablet TAKE 1 TABLET BY MOUTH TWICE DAILY 180 tablet 4  . bacitracin-polymyxin b (POLYSPORIN) ophthalmic ointment Place 1 application into both eyes 3 (three) times daily. apply 1 inch to each eye 3 times a day 3.5 g 0  . calcium-vitamin D (OSCAL WITH D) 500-200 MG-UNIT per tablet Take 2 tablets by mouth daily with breakfast. 60 tablet 11  . clotrimazole (LOTRIMIN) 1 % cream Apply 1 application topically 2 (two) times daily as needed (yeast rash).    Marland Kitchen letrozole (FEMARA) 2.5 MG tablet Take 1 tablet (2.5 mg total) by mouth daily. 90 tablet 3  . lisinopril (PRINIVIL,ZESTRIL) 40 MG tablet Take 1 tablet (40 mg total) by mouth daily. 30 tablet 3    . loratadine (CLARITIN) 10 MG tablet Take 10 mg by mouth daily as needed for allergies.     Marland Kitchen LYRICA 75 MG capsule TAKE 2 CAPSULES BY MOUTH ONCE DAILY 60 capsule 3  . metoprolol succinate (TOPROL-XL) 25 MG 24 hr tablet TAKE 3 TABLETS BY MOUTH ONCE DAILY WITH OR IMMEDIATELY FOLLOWING A MEAL 90 tablet 2  . morphine (MS CONTIN) 60 MG 12 hr tablet Take 2 tablet in AM, take 1 tablet in afternoon, take 2 tablets in evening 150 tablet 0  . OVER THE COUNTER MEDICATION Take 1 oz by mouth 2 (two) times daily. Liquid calcium with vitamin D    . Oxycodone HCl 10 MG TABS Take 1 or 2 tablets every 4 hours to control pain. 150 tablet 0  . palbociclib (IBRANCE) 100 MG capsule Take 1 capsule daily with food for 21 days, rest 7 days, then resume taking the medicine 3 weeks on, 1 week off. 21 capsule 6  . polyethylene glycol (MIRALAX / GLYCOLAX) packet Take 8.5 g by mouth daily as needed for moderate constipation.    . promethazine (PHENERGAN) 25 MG tablet Take 1 tablet (25 mg total) by mouth every 6 (six) hours as needed for nausea  or vomiting. 30 tablet 6  . temazepam (RESTORIL) 15 MG capsule Take 1-2 capsules (15-30 mg total) by mouth at bedtime as needed for sleep. 60 capsule 3  . tetrahydrozoline-zinc (VISINE-AC) 0.05-0.25 % ophthalmic solution Place 2 drops into both eyes 3 (three) times daily as needed (for allergies).     . traZODone (DESYREL) 50 MG tablet TAKE 1 TABLET BY MOUTH AT BEDTIME. 30 tablet 6  . venlafaxine XR (EFFEXOR-XR) 75 MG 24 hr capsule TAKE 3 CAPSULES BY MOUTH DAILY 90 capsule 2  . [DISCONTINUED] FLUoxetine (PROZAC) 20 MG tablet Take 40 mg by mouth daily.       No current facility-administered medications on file prior to visit.    Past Surgical History  Procedure Laterality Date  . Tubal ligation    . Wisdom tooth extraction    . Mastectomy modified radical  10/10/10     left -Dr Margot Chimes  . Laparoscopic assisted vaginal hysterectomy Bilateral 10/14/2013    Procedure: LAPAROSCOPIC  ASSISTED VAGINAL HYSTERECTOMY;  Surgeon: Ena Dawley, MD;  Location: [facility];  Service: Gynecology;  Laterality: Bilateral;  . Cystoscopy N/A 10/14/2013    Procedure: CYSTOSCOPY;  Surgeon: Ena Dawley, MD;  Location: [facility];  Service: Gynecology;  Laterality: N/A;  . Laparoscopic lysis of adhesions N/A 10/14/2013    Procedure: LAPAROSCOPIC LYSIS OF ADHESIONS;  Surgeon: Ena Dawley, MD;  Location: [facility];  Service: Gynecology;  Laterality: N/A;  . Laparoscopic bilateral salpingo oopherectomy Bilateral 10/14/2013    Procedure: LAPAROSCOPIC BILATERAL SALPINGO OOPHORECTOMY;  Surgeon: Ena Dawley, MD;  Location: [facility];  Service: Gynecology;  Laterality: Bilateral;  . Esophagogastroduodenoscopy N/A 10/27/2013    Procedure: ESOPHAGOGASTRODUODENOSCOPY (EGD);  Surgeon: Wonda Horner, MD;  Location: [facility];  Service: Endoscopy;  Laterality: N/A;  . Bone biopsy Left 11/2013    left post. hip    Denies any headaches, dizziness, double vision, fevers, chills, night sweats, nausea, vomiting, diarrhea, constipation, chest pain, heart palpitations, shortness of breath, blood in stool, black tarry stool, urinary pain, urinary burning, urinary frequency, hematuria.   PHYSICAL EXAMINATION  ECOG PERFORMANCE STATUS: 2 - Symptomatic, <50% confined to bed  Filed Vitals:   05/12/15 0852  BP: 94/54  Pulse: 102  Temp: 97.9 F (36.6 C)  Resp: 18    GENERAL:alert, well nourished, well developed, comfortable, cooperative, obese, smiling and not particularly clean, accompanied by her husband, smiling, in a wheelchair. SKIN: skin color, texture, turgor are normal, no rashes or significant lesions HEAD: Normocephalic, No masses, lesions, tenderness or abnormalities EYES: normal, PERRLA, EOMI EARS: External ears normal OROPHARYNX:lips, buccal mucosa, and tongue normal, mucous membranes are moist  NECK: supple, thyroid normal size, non-tender, without nodularity, trachea midline LYMPH:  no palpable  lymphadenopathy, no hepatosplenomegaly BREAST:not examined LUNGS: clear to auscultation  HEART: regular rate & rhythm, no murmurs and no gallops ABDOMEN:abdomen soft, non-tender, obese and normal bowel sounds BACK: Back symmetric, no curvature. EXTREMITIES:positive findings:  Left UE with lymphedema, right wrist in immobilization splint. NEURO: alert & oriented x 3 with fluent speech, no focal motor/sensory deficits, in a wheelchair.   LABORATORY DATA: CBC    Component Value Date/Time   WBC 3.8* 05/12/2015 0809   WBC 7.1 01/24/2012 1539   RBC 3.30* 05/12/2015 0809   RBC 3.00* 10/27/2013 0800   RBC 4.39 01/24/2012 1539   HGB 11.2* 05/12/2015 0809   HGB 14.7 01/24/2012 1539   HCT 33.5* 05/12/2015 0809   HCT 43.7 01/24/2012 1539   PLT 375 05/12/2015 0809  PLT 258 01/24/2012 1539   MCV 101.5* 05/12/2015 0809   MCV 99.5 01/24/2012 1539   MCH 33.9 05/12/2015 0809   MCH 33.5 01/24/2012 1539   MCHC 33.4 05/12/2015 0809   MCHC 33.6 01/24/2012 1539   RDW 13.3 05/12/2015 0809   RDW 14.1 01/24/2012 1539   LYMPHSABS 1.2 05/12/2015 0809   LYMPHSABS 1.4 01/24/2012 1539   MONOABS 0.4 05/12/2015 0809   MONOABS 0.6 01/24/2012 1539   EOSABS 0.1 05/12/2015 0809   EOSABS 0.3 01/24/2012 1539   BASOSABS 0.0 05/12/2015 0809   BASOSABS 0.0 01/24/2012 1539      Chemistry      Component Value Date/Time   NA 132* 05/12/2015 0809   K 4.7 05/12/2015 0809   CL 98* 05/12/2015 0809   CO2 24 05/12/2015 0809   BUN 33* 05/12/2015 0809   CREATININE 1.13* 05/12/2015 0809      Component Value Date/Time   CALCIUM 9.7 05/12/2015 0809   ALKPHOS 88 05/12/2015 0809   AST 57* 05/12/2015 0809   ALT 64* 05/12/2015 0809   BILITOT 0.5 05/12/2015 0809     Lab Results  Component Value Date   LABCA2 44.0* 04/07/2015    PENDING LABS:   RADIOGRAPHIC STUDIES:  No results found.   PATHOLOGY:    ASSESSMENT AND PLAN:  Carcinoma of breast metastatic to bone ER/PR+, Her2 negative malignancy, on  Letrozole and Ibrance 100 mg daily 3 weeks on and 1 week off every 4 weeks.  Tolerating well thus far.  On Xgeva therapy for bone metastases with Ca++ and Vit D to prevent hypocalcemia associated with Xgeva.    Labs today as ordered: CBC diff, CMET, Vitamin B12, Folate, CA 27.29  Labs in 4 and 8 weeks: CBC diff, CMET, CA 27.29  Continue treatment as planned and outlined above.    Calcium today is WNL and therefore, she will receive Xgeva.  She would like her Flexeril change to as needed dosing and therefore I sent a new prescription for 3 times daily as needed.  The need a refill on Xanax and I printed this prescription on 05/09/2015 requesting nursing to call this medication into [facility].  I called the pharmacy and they have not received a prescription for Xanax and therefore I have called in a refill of Xanax 5.  I suspect she strained/sprained a ligament/tendon/muscle in her right hand.  She is encouraged to perform range of motion exercises. X-ray that was performed last month was unimpressive. She is plenty of pain medication at home and therefore I have not adjusted any of these.  Her disease is stable.  I think it is reasonable to continue to monitor her labs with Xgeva monthly and see her back for follow-up in 2 months.  B12 deficiency Low-normal B12.  This was reported after the patient left and performed due to her macrocytosis.    She will be recalled for intrinsic factor and anti-parietal cell antibody testing.  Supportive therapy plan built for weekly B12 x 4.   THERAPY PLAN:  Continue with treatment as planned.  All questions were answered. The patient knows to call the clinic with any problems, questions or concerns. We can certainly see the patient much sooner if necessary.  Patient and plan discussed with Dr. Ancil Linsey and she is in agreement with the aforementioned.   This note is electronically signed by: Doy Mince 05/12/2015 3:17  PM

## 2015-05-12 ENCOUNTER — Encounter (HOSPITAL_BASED_OUTPATIENT_CLINIC_OR_DEPARTMENT_OTHER): Payer: Self-pay

## 2015-05-12 ENCOUNTER — Encounter (HOSPITAL_COMMUNITY): Payer: Self-pay | Admitting: Oncology

## 2015-05-12 ENCOUNTER — Encounter (HOSPITAL_COMMUNITY): Payer: Self-pay | Attending: Oncology | Admitting: Oncology

## 2015-05-12 ENCOUNTER — Encounter (HOSPITAL_COMMUNITY): Payer: Self-pay

## 2015-05-12 VITALS — BP 94/54 | HR 102 | Temp 97.9°F | Resp 18 | Wt 250.4 lb

## 2015-05-12 DIAGNOSIS — C50919 Malignant neoplasm of unspecified site of unspecified female breast: Secondary | ICD-10-CM

## 2015-05-12 DIAGNOSIS — C50312 Malignant neoplasm of lower-inner quadrant of left female breast: Secondary | ICD-10-CM | POA: Insufficient documentation

## 2015-05-12 DIAGNOSIS — R978 Other abnormal tumor markers: Secondary | ICD-10-CM | POA: Insufficient documentation

## 2015-05-12 DIAGNOSIS — R938 Abnormal findings on diagnostic imaging of other specified body structures: Secondary | ICD-10-CM | POA: Insufficient documentation

## 2015-05-12 DIAGNOSIS — E538 Deficiency of other specified B group vitamins: Secondary | ICD-10-CM

## 2015-05-12 DIAGNOSIS — C7951 Secondary malignant neoplasm of bone: Secondary | ICD-10-CM

## 2015-05-12 DIAGNOSIS — F411 Generalized anxiety disorder: Secondary | ICD-10-CM | POA: Insufficient documentation

## 2015-05-12 HISTORY — DX: Deficiency of other specified B group vitamins: E53.8

## 2015-05-12 LAB — CBC WITH DIFFERENTIAL/PLATELET
Basophils Absolute: 0 10*3/uL (ref 0.0–0.1)
Basophils Relative: 1 %
EOS ABS: 0.1 10*3/uL (ref 0.0–0.7)
EOS PCT: 3 %
HCT: 33.5 % — ABNORMAL LOW (ref 36.0–46.0)
Hemoglobin: 11.2 g/dL — ABNORMAL LOW (ref 12.0–15.0)
LYMPHS ABS: 1.2 10*3/uL (ref 0.7–4.0)
LYMPHS PCT: 31 %
MCH: 33.9 pg (ref 26.0–34.0)
MCHC: 33.4 g/dL (ref 30.0–36.0)
MCV: 101.5 fL — AB (ref 78.0–100.0)
MONO ABS: 0.4 10*3/uL (ref 0.1–1.0)
MONOS PCT: 10 %
Neutro Abs: 2.2 10*3/uL (ref 1.7–7.7)
Neutrophils Relative %: 55 %
PLATELETS: 375 10*3/uL (ref 150–400)
RBC: 3.3 MIL/uL — ABNORMAL LOW (ref 3.87–5.11)
RDW: 13.3 % (ref 11.5–15.5)
WBC: 3.8 10*3/uL — ABNORMAL LOW (ref 4.0–10.5)

## 2015-05-12 LAB — COMPREHENSIVE METABOLIC PANEL
ALK PHOS: 88 U/L (ref 38–126)
ALT: 64 U/L — ABNORMAL HIGH (ref 14–54)
ANION GAP: 10 (ref 5–15)
AST: 57 U/L — ABNORMAL HIGH (ref 15–41)
Albumin: 4.2 g/dL (ref 3.5–5.0)
BUN: 33 mg/dL — ABNORMAL HIGH (ref 6–20)
CALCIUM: 9.7 mg/dL (ref 8.9–10.3)
CHLORIDE: 98 mmol/L — AB (ref 101–111)
CO2: 24 mmol/L (ref 22–32)
Creatinine, Ser: 1.13 mg/dL — ABNORMAL HIGH (ref 0.44–1.00)
GFR, EST NON AFRICAN AMERICAN: 54 mL/min — AB (ref >60)
Glucose, Bld: 128 mg/dL — ABNORMAL HIGH (ref 65–99)
Potassium: 4.7 mmol/L (ref 3.5–5.1)
SODIUM: 132 mmol/L — AB (ref 135–145)
Total Bilirubin: 0.5 mg/dL (ref 0.3–1.2)
Total Protein: 8 g/dL (ref 6.5–8.1)

## 2015-05-12 LAB — FOLATE: FOLATE: 51.9 ng/mL (ref >5.9)

## 2015-05-12 LAB — VITAMIN B12: VITAMIN B 12: 194 pg/mL (ref 180–914)

## 2015-05-12 MED ORDER — DENOSUMAB 120 MG/1.7ML ~~LOC~~ SOLN
120.0000 mg | Freq: Once | SUBCUTANEOUS | Status: AC
  Administered 2015-05-12: 120 mg via SUBCUTANEOUS
  Filled 2015-05-12: qty 1.7

## 2015-05-12 MED ORDER — ALPRAZOLAM 1 MG PO TABS
ORAL_TABLET | ORAL | Status: DC
Start: 2015-05-12 — End: 2015-10-24

## 2015-05-12 MED ORDER — CYCLOBENZAPRINE HCL 10 MG PO TABS: 10.0000 mg | ORAL_TABLET | Freq: Three times a day (TID) | ORAL | Status: DC | PRN

## 2015-05-12 MED FILL — ALPRAZolam 1 MG TABS: 1 | 30 days supply | Qty: 90 | Fill #0

## 2015-05-12 NOTE — Addendum Note (Signed)
Addended by: Baird Cancer on: 05/12/2015 03:17 PM   Modules accepted: Orders, Level of Service

## 2015-05-12 NOTE — Patient Instructions (Signed)
[place] at [facility] Discharge Instructions  RECOMMENDATIONS MADE BY THE CONSULTANT AND ANY TEST RESULTS WILL BE SENT TO YOUR REFERRING PHYSICIAN.  Xgeva 120 mg injection given as ordered.  Thank you for choosing [place] at [facility] to provide your oncology and hematology care.  To afford each patient quality time with our provider, please arrive at least 15 minutes before your scheduled appointment time.   Beginning January 23rd 2017 lab work for the [facility] will be done in the  Main lab at [facility] on 1st floor. If you have a lab appointment with the [facility] please come in thru the  Main Entrance and check in at the main information desk  You need to re-schedule your appointment should you arrive 10 or more minutes late.  We strive to give you quality time with our providers, and arriving late affects you and other patients whose appointments are after yours.  Also, if you no show three or more times for appointments you may be dismissed from the clinic at the providers discretion.     Again, thank you for choosing [facility].  Our hope is that these requests will decrease the amount of time that you wait before being seen by our physicians.       _____________________________________________________________  Should you have questions after your visit to [facility], please contact our office at [phone] between the hours of 8:30 a.m. and 4:30 p.m.  Voicemails left after 4:30 p.m. will not be returned until the following business day.  For prescription refill requests, have your pharmacy contact our office.         Resources For Cancer Patients and their Caregivers ? American Cancer Society: Can assist with transportation, wigs, general needs, runs Look Good Feel Better.        319-090-3485 ? Cancer Care: Provides financial assistance, online support groups, medication/co-pay  assistance.  1-800-813-HOPE 561-174-1541) ? [facility] Assists [place] cancer patients and their families through emotional , educational and financial support.  607-658-4366 ? [place] DSS Where to apply for food stamps, Medicaid and utility assistance. 6097738955 ? RCATS: Transportation to medical appointments. (561)601-6764 ? Social Security Administration: May apply for disability if have a Stage IV cancer. 807-804-6310 351-265-1205 ? [employer], Disability and Transit Services: Assists with nutrition, care and transit needs. (919)243-1712

## 2015-05-12 NOTE — Progress Notes (Signed)
Brittany Blair presents today for injection per MD orders. Xgeva 120 mg injection administered SQ in right Upper Arm. Administration without incident. Patient tolerated well.

## 2015-05-12 NOTE — Assessment & Plan Note (Signed)
Low-normal B12.  This was reported after the patient left and performed due to her macrocytosis.    She will be recalled for intrinsic factor and anti-parietal cell antibody testing.  Supportive therapy plan built for weekly B12 x 4.

## 2015-05-12 NOTE — Patient Instructions (Signed)
Labs today are stable.  Continue Ibrance and Femara as prescribed.  Calcium is within normal limit today and therefore Delton See was given.  Continue your calcium at home.  You will be due for your next injection in 4 weeks.  Return in 4 weeks for labs and Xgeva.  We will see you back in a follow-up appointment in 8 weeks.  [name], PA-C 05/12/2015 9:19 AM

## 2015-05-13 LAB — CANCER ANTIGEN 27.29: CA 27.29: 50.4 U/mL — AB (ref 0.0–38.6)

## 2015-05-15 ENCOUNTER — Ambulatory Visit (HOSPITAL_COMMUNITY): Payer: Self-pay

## 2015-05-15 ENCOUNTER — Other Ambulatory Visit (HOSPITAL_COMMUNITY): Payer: Self-pay

## 2015-05-15 MED FILL — VENLAFAXINE HCL ER 75 MG CA: 75 | 30 days supply | Qty: 90 | Fill #1

## 2015-05-16 ENCOUNTER — Encounter (HOSPITAL_COMMUNITY): Payer: Self-pay

## 2015-05-16 ENCOUNTER — Encounter (HOSPITAL_BASED_OUTPATIENT_CLINIC_OR_DEPARTMENT_OTHER): Payer: Self-pay

## 2015-05-16 VITALS — BP 97/66 | HR 110 | Temp 99.1°F | Resp 18

## 2015-05-16 DIAGNOSIS — E538 Deficiency of other specified B group vitamins: Secondary | ICD-10-CM

## 2015-05-16 DIAGNOSIS — C7951 Secondary malignant neoplasm of bone: Secondary | ICD-10-CM

## 2015-05-16 DIAGNOSIS — C50919 Malignant neoplasm of unspecified site of unspecified female breast: Secondary | ICD-10-CM

## 2015-05-16 MED ORDER — CYANOCOBALAMIN 1000 MCG/ML IJ SOLN
1000.0000 ug | Freq: Once | INTRAMUSCULAR | Status: AC
  Administered 2015-05-16: 1000 ug via INTRAMUSCULAR
  Filled 2015-05-16: qty 1

## 2015-05-16 NOTE — Progress Notes (Signed)
Brittany Blair presents today for injection per the provider's orders.  Vitamin b12 administration without incident; see MAR for injection details. Patient had labs drawn for antibody testing done prior to arrival to clinic. Patient tolerated procedure well and without incident.  No questions or complaints noted at this time.

## 2015-05-16 NOTE — Patient Instructions (Signed)
Vitamin b12 injection today and weekly x 3 more injections

## 2015-05-17 LAB — INTRINSIC FACTOR ANTIBODIES: Intrinsic Factor: 1 AU/mL (ref 0.0–1.1)

## 2015-05-17 LAB — ANTI-PARIETAL ANTIBODY: PARIETAL CELL ANTIBODY-IGG: 46.9 U — AB (ref 0.0–20.0)

## 2015-05-18 ENCOUNTER — Other Ambulatory Visit (HOSPITAL_COMMUNITY): Payer: Self-pay | Admitting: Hematology & Oncology

## 2015-05-18 ENCOUNTER — Other Ambulatory Visit (HOSPITAL_COMMUNITY): Payer: Self-pay | Admitting: Oncology

## 2015-05-18 DIAGNOSIS — E538 Deficiency of other specified B group vitamins: Secondary | ICD-10-CM

## 2015-05-18 MED FILL — LYRICA 75 MG CAPSULE: 75 | 30 days supply | Qty: 60 | Fill #2

## 2015-05-18 MED FILL — traZODone HCL 50 MG TABS: 50 | 30 days supply | Qty: 30 | Fill #0

## 2015-05-22 ENCOUNTER — Encounter (HOSPITAL_BASED_OUTPATIENT_CLINIC_OR_DEPARTMENT_OTHER): Payer: Self-pay

## 2015-05-22 ENCOUNTER — Encounter (HOSPITAL_COMMUNITY): Payer: Self-pay

## 2015-05-22 VITALS — BP 121/69 | HR 93 | Temp 99.3°F | Resp 18

## 2015-05-22 DIAGNOSIS — E538 Deficiency of other specified B group vitamins: Secondary | ICD-10-CM

## 2015-05-22 MED ORDER — CYANOCOBALAMIN 1000 MCG/ML IJ SOLN
INTRAMUSCULAR | Status: AC
  Filled 2015-05-22: qty 1

## 2015-05-22 MED ORDER — CYANOCOBALAMIN 1000 MCG/ML IJ SOLN
1000.0000 ug | Freq: Once | INTRAMUSCULAR | Status: AC
  Administered 2015-05-22: 1000 ug via INTRAMUSCULAR

## 2015-05-22 MED ORDER — CYANOCOBALAMIN 1000 MCG/ML IJ SOLN: 1000.0000 ug | Freq: Once | INTRAMUSCULAR | Status: DC

## 2015-05-22 NOTE — Progress Notes (Signed)
Brittany Blair presents today for injection per the provider's orders.  Vitamin b12 administration without incident; see MAR for injection details.  Patient tolerated procedure well and without incident.  No questions or complaints noted at this time.

## 2015-05-23 ENCOUNTER — Telehealth (HOSPITAL_COMMUNITY): Payer: Self-pay | Admitting: Oncology

## 2015-05-23 ENCOUNTER — Other Ambulatory Visit (HOSPITAL_COMMUNITY): Payer: Self-pay | Admitting: Oncology

## 2015-05-23 DIAGNOSIS — C7951 Secondary malignant neoplasm of bone: Principal | ICD-10-CM

## 2015-05-23 DIAGNOSIS — C50919 Malignant neoplasm of unspecified site of unspecified female breast: Secondary | ICD-10-CM

## 2015-05-23 MED ORDER — PALBOCICLIB 100 MG PO CAPS: ORAL_CAPSULE | ORAL | Status: DC

## 2015-05-23 NOTE — Telephone Encounter (Signed)
Faxed xgeva rx replacement to [facility]

## 2015-05-29 ENCOUNTER — Encounter (HOSPITAL_COMMUNITY): Payer: Self-pay | Attending: Oncology

## 2015-05-29 ENCOUNTER — Other Ambulatory Visit (HOSPITAL_COMMUNITY): Payer: Self-pay | Admitting: Oncology

## 2015-05-29 VITALS — BP 120/68 | HR 90 | Temp 98.2°F | Resp 16

## 2015-05-29 DIAGNOSIS — C7951 Secondary malignant neoplasm of bone: Principal | ICD-10-CM

## 2015-05-29 DIAGNOSIS — R938 Abnormal findings on diagnostic imaging of other specified body structures: Secondary | ICD-10-CM | POA: Insufficient documentation

## 2015-05-29 DIAGNOSIS — R978 Other abnormal tumor markers: Secondary | ICD-10-CM | POA: Insufficient documentation

## 2015-05-29 DIAGNOSIS — C50312 Malignant neoplasm of lower-inner quadrant of left female breast: Secondary | ICD-10-CM | POA: Insufficient documentation

## 2015-05-29 DIAGNOSIS — E538 Deficiency of other specified B group vitamins: Secondary | ICD-10-CM

## 2015-05-29 DIAGNOSIS — C50919 Malignant neoplasm of unspecified site of unspecified female breast: Secondary | ICD-10-CM

## 2015-05-29 DIAGNOSIS — F411 Generalized anxiety disorder: Secondary | ICD-10-CM | POA: Insufficient documentation

## 2015-05-29 DIAGNOSIS — G893 Neoplasm related pain (acute) (chronic): Secondary | ICD-10-CM

## 2015-05-29 MED ORDER — MORPHINE SULFATE ER 60 MG PO TBCR: EXTENDED_RELEASE_TABLET | ORAL | Status: DC

## 2015-05-29 MED ORDER — OXYCODONE HCL 10 MG PO TABS: ORAL_TABLET | ORAL | Status: DC

## 2015-05-29 MED ORDER — CYANOCOBALAMIN 1000 MCG/ML IJ SOLN
INTRAMUSCULAR | Status: AC
  Filled 2015-05-29: qty 1

## 2015-05-29 MED ORDER — CYANOCOBALAMIN 1000 MCG/ML IJ SOLN
1000.0000 ug | Freq: Once | INTRAMUSCULAR | Status: AC
  Administered 2015-05-29: 1000 ug via INTRAMUSCULAR

## 2015-05-29 MED FILL — LISINOPRIL 40 MG TABLET: 40 | 30 days supply | Qty: 30 | Fill #3

## 2015-05-29 NOTE — Progress Notes (Signed)
Brittany Blair presents today for injection per MD orders. B12 1068mcg administered IM in right Upper Arm. Administration without incident. Patient tolerated well.

## 2015-05-29 NOTE — Patient Instructions (Signed)
[facility] at [facility] Discharge Instructions  RECOMMENDATIONS MADE BY THE CONSULTANT AND ANY TEST RESULTS WILL BE SENT TO YOUR REFERRING PHYSICIAN.  B12 today.    Thank you for choosing [facility] at [facility] to provide your oncology and hematology care.  To afford each patient quality time with our provider, please arrive at least 15 minutes before your scheduled appointment time.   Beginning January 23rd 2017 lab work for the Cancer Center will be done in the  Main lab at [facility] on 1st floor. If you have a lab appointment with the Cancer Center please come in thru the  Main Entrance and check in at the main information desk  You need to re-schedule your appointment should you arrive 10 or more minutes late.  We strive to give you quality time with our providers, and arriving late affects you and other patients whose appointments are after yours.  Also, if you no show three or more times for appointments you may be dismissed from the clinic at the providers discretion.     Again, thank you for choosing [facility].  Our hope is that these requests will decrease the amount of time that you wait before being seen by our physicians.       _____________________________________________________________  Should you have questions after your visit to [facility], please contact our office at [phone] between the hours of 8:30 a.m. and 4:30 p.m.  Voicemails left after 4:30 p.m. will not be returned until the following business day.  For prescription refill requests, have your pharmacy contact our office.         Resources For Cancer Patients and their Caregivers ? American Cancer Society: Can assist with transportation, wigs, general needs, runs Look Good Feel Better.        1-888-227-6333 ? Cancer Care: Provides financial assistance, online support groups, medication/co-pay assistance.  1-800-813-HOPE  (4673) ? [facility] Assists [place] cancer patients and their families through emotional , educational and financial support.  336-427-4357 ? [place] DSS Where to apply for food stamps, Medicaid and utility assistance. 336-342-1394 ? RCATS: Transportation to medical appointments. 336-347-2287 ? Social Security Administration: May apply for disability if have a Stage IV cancer. 336-342-7796 1-800-772-1213 ? [place] A[facility], Disability and Transit Services: Assists with nutrition, care and transit needs. 336-349-2343  

## 2015-05-30 MED FILL — oxyCODONE HCL 10 MG TABS: 10 | 12 days supply | Qty: 150 | Fill #0

## 2015-05-30 MED FILL — MORPHINE SULF 60 MG TAB SA: 60 | 30 days supply | Qty: 150 | Fill #0

## 2015-06-01 MED FILL — CYCLOBENZAPRINE 10 MG TAB: 10 | 30 days supply | Qty: 90 | Fill #2

## 2015-06-05 ENCOUNTER — Encounter (HOSPITAL_COMMUNITY): Payer: Self-pay

## 2015-06-05 ENCOUNTER — Encounter (HOSPITAL_BASED_OUTPATIENT_CLINIC_OR_DEPARTMENT_OTHER): Payer: Self-pay

## 2015-06-05 ENCOUNTER — Other Ambulatory Visit (HOSPITAL_COMMUNITY): Payer: Self-pay | Admitting: Oncology

## 2015-06-05 VITALS — BP 89/50 | HR 93 | Temp 98.8°F | Resp 16

## 2015-06-05 DIAGNOSIS — C7951 Secondary malignant neoplasm of bone: Secondary | ICD-10-CM

## 2015-06-05 DIAGNOSIS — C50919 Malignant neoplasm of unspecified site of unspecified female breast: Secondary | ICD-10-CM

## 2015-06-05 DIAGNOSIS — E538 Deficiency of other specified B group vitamins: Secondary | ICD-10-CM

## 2015-06-05 MED ORDER — CYANOCOBALAMIN 1000 MCG/ML IJ SOLN
1000.0000 ug | Freq: Once | INTRAMUSCULAR | Status: AC
  Administered 2015-06-05: 1000 ug via INTRAMUSCULAR

## 2015-06-05 MED ORDER — CYANOCOBALAMIN 1000 MCG/ML IJ SOLN
INTRAMUSCULAR | Status: AC
  Filled 2015-06-05: qty 1

## 2015-06-05 MED FILL — AMLODIPINE BESYLATE 5 MG TA: 5 | 90 days supply | Qty: 180 | Fill #1

## 2015-06-05 MED FILL — ACYCLOVIR 400 MG TABLET: 400 | 30 days supply | Qty: 60 | Fill #0

## 2015-06-05 MED FILL — TEMAZEPAM 15 MG CAPSULE: 15 | 30 days supply | Qty: 60 | Fill #2

## 2015-06-05 NOTE — Progress Notes (Signed)
Brittany Blair presents today for injection per the provider's orders.  Vitamin b12 administration without incident; see MAR for injection details.  Patient tolerated procedure well and without incident.  No questions or complaints noted at this time.

## 2015-06-12 MED FILL — LETROZOLE 2.5 MG TABLET: 2.5 | 90 days supply | Qty: 90 | Fill #2

## 2015-06-14 ENCOUNTER — Other Ambulatory Visit (HOSPITAL_COMMUNITY): Payer: Self-pay | Admitting: Oncology

## 2015-06-14 MED FILL — ALPRAZolam 1 MG TABS: 1 | 30 days supply | Qty: 90 | Fill #1

## 2015-06-14 MED FILL — METOPROLOL SUCC ER 25 MG TA: 25 | 30 days supply | Qty: 90 | Fill #1

## 2015-06-16 ENCOUNTER — Other Ambulatory Visit (HOSPITAL_COMMUNITY): Payer: Self-pay

## 2015-06-16 ENCOUNTER — Ambulatory Visit (HOSPITAL_COMMUNITY): Payer: Self-pay

## 2015-06-16 ENCOUNTER — Other Ambulatory Visit (HOSPITAL_COMMUNITY): Payer: Self-pay | Admitting: Emergency Medicine

## 2015-06-16 ENCOUNTER — Encounter (HOSPITAL_BASED_OUTPATIENT_CLINIC_OR_DEPARTMENT_OTHER): Payer: Self-pay

## 2015-06-16 ENCOUNTER — Encounter (HOSPITAL_COMMUNITY): Payer: Self-pay

## 2015-06-16 VITALS — BP 115/63 | HR 94 | Temp 97.5°F | Resp 16

## 2015-06-16 DIAGNOSIS — C7951 Secondary malignant neoplasm of bone: Principal | ICD-10-CM

## 2015-06-16 DIAGNOSIS — C50919 Malignant neoplasm of unspecified site of unspecified female breast: Secondary | ICD-10-CM

## 2015-06-16 LAB — COMPREHENSIVE METABOLIC PANEL
ALBUMIN: 4.1 g/dL (ref 3.5–5.0)
ALK PHOS: 130 U/L — AB (ref 38–126)
ALT: 61 U/L — AB (ref 14–54)
ANION GAP: 11 (ref 5–15)
AST: 45 U/L — AB (ref 15–41)
BILIRUBIN TOTAL: 0.2 mg/dL — AB (ref 0.3–1.2)
BUN: 42 mg/dL — AB (ref 6–20)
CALCIUM: 9.3 mg/dL (ref 8.9–10.3)
CO2: 23 mmol/L (ref 22–32)
CREATININE: 0.84 mg/dL (ref 0.44–1.00)
Chloride: 100 mmol/L — ABNORMAL LOW (ref 101–111)
GFR calc Af Amer: >60 mL/min (ref >60)
GFR calc non Af Amer: >60 mL/min (ref >60)
GLUCOSE: 123 mg/dL — AB (ref 65–99)
Potassium: 4.7 mmol/L (ref 3.5–5.1)
SODIUM: 134 mmol/L — AB (ref 135–145)
TOTAL PROTEIN: 8.1 g/dL (ref 6.5–8.1)

## 2015-06-16 LAB — CBC WITH DIFFERENTIAL/PLATELET
BASOS ABS: 0 10*3/uL (ref 0.0–0.1)
BASOS PCT: 1 %
EOS ABS: 0.1 10*3/uL (ref 0.0–0.7)
Eosinophils Relative: 2 %
HEMATOCRIT: 32.3 % — AB (ref 36.0–46.0)
HEMOGLOBIN: 10.9 g/dL — AB (ref 12.0–15.0)
Lymphocytes Relative: 32 %
Lymphs Abs: 1.3 10*3/uL (ref 0.7–4.0)
MCH: 32.8 pg (ref 26.0–34.0)
MCHC: 33.7 g/dL (ref 30.0–36.0)
MCV: 97.3 fL (ref 78.0–100.0)
MONOS PCT: 10 %
Monocytes Absolute: 0.4 10*3/uL (ref 0.1–1.0)
NEUTROS ABS: 2.3 10*3/uL (ref 1.7–7.7)
NEUTROS PCT: 55 %
Platelets: 264 10*3/uL (ref 150–400)
RBC: 3.32 MIL/uL — AB (ref 3.87–5.11)
RDW: 12.9 % (ref 11.5–15.5)
WBC: 4.1 10*3/uL (ref 4.0–10.5)

## 2015-06-16 MED ORDER — PROMETHAZINE HCL 25 MG PO TABS: 25.0000 mg | ORAL_TABLET | Freq: Four times a day (QID) | ORAL | Status: DC | PRN

## 2015-06-16 MED ORDER — DENOSUMAB 120 MG/1.7ML ~~LOC~~ SOLN
120.0000 mg | Freq: Once | SUBCUTANEOUS | Status: AC
  Administered 2015-06-16: 120 mg via SUBCUTANEOUS
  Filled 2015-06-16: qty 1.7

## 2015-06-16 MED FILL — PROMETHAZINE 25 MG TABLET: 25 | 7 days supply | Qty: 30 | Fill #0

## 2015-06-16 NOTE — Progress Notes (Signed)
Brittany Blair presents today for injection per the provider's orders.  xgeva administration without incident; see MAR for injection details.  Patient tolerated procedure well and without incident.  No questions or complaints noted at this time.

## 2015-06-16 NOTE — Patient Instructions (Signed)
Xgeva today Start 2000 mcg of OTC B12 daily

## 2015-06-17 LAB — CANCER ANTIGEN 27.29: CA 27.29: 44.4 U/mL — AB (ref 0.0–38.6)

## 2015-06-19 ENCOUNTER — Other Ambulatory Visit (HOSPITAL_COMMUNITY): Payer: Self-pay | Admitting: Oncology

## 2015-06-19 DIAGNOSIS — C7951 Secondary malignant neoplasm of bone: Principal | ICD-10-CM

## 2015-06-19 DIAGNOSIS — C50919 Malignant neoplasm of unspecified site of unspecified female breast: Secondary | ICD-10-CM

## 2015-06-19 MED ORDER — OXYCODONE HCL 10 MG PO TABS: ORAL_TABLET | ORAL | Status: DC

## 2015-06-19 MED FILL — LYRICA 75 MG CAPSULE: 75 | 30 days supply | Qty: 60 | Fill #3

## 2015-06-19 MED FILL — VENLAFAXINE HCL ER 75 MG CA: 75 | 30 days supply | Qty: 90 | Fill #2

## 2015-06-19 MED FILL — traZODone HCL 50 MG TABS: 50 | 30 days supply | Qty: 30 | Fill #1

## 2015-06-22 ENCOUNTER — Other Ambulatory Visit (HOSPITAL_COMMUNITY): Payer: Self-pay | Admitting: Oncology

## 2015-06-22 ENCOUNTER — Ambulatory Visit (HOSPITAL_COMMUNITY)
Admission: RE | Admit: 2015-06-22 | Discharge: 2015-06-22 | Disposition: A | Discharge status: Home / Self Care | Payer: Self-pay | Source: Ambulatory Visit | Attending: Oncology | Admitting: Oncology

## 2015-06-22 DIAGNOSIS — C7951 Secondary malignant neoplasm of bone: Secondary | ICD-10-CM | POA: Insufficient documentation

## 2015-06-22 DIAGNOSIS — C50919 Malignant neoplasm of unspecified site of unspecified female breast: Secondary | ICD-10-CM | POA: Insufficient documentation

## 2015-06-22 DIAGNOSIS — K6389 Other specified diseases of intestine: Secondary | ICD-10-CM | POA: Insufficient documentation

## 2015-06-23 MED FILL — CYCLOBENZAPRINE 10 MG TAB: 10 | 30 days supply | Qty: 90 | Fill #0

## 2015-06-27 ENCOUNTER — Other Ambulatory Visit (HOSPITAL_COMMUNITY): Payer: Self-pay | Admitting: Oncology

## 2015-06-27 DIAGNOSIS — G893 Neoplasm related pain (acute) (chronic): Secondary | ICD-10-CM

## 2015-06-27 DIAGNOSIS — C7951 Secondary malignant neoplasm of bone: Principal | ICD-10-CM

## 2015-06-27 DIAGNOSIS — C50919 Malignant neoplasm of unspecified site of unspecified female breast: Secondary | ICD-10-CM

## 2015-06-27 MED ORDER — MORPHINE SULFATE ER 60 MG PO TBCR: EXTENDED_RELEASE_TABLET | ORAL | Status: DC

## 2015-06-27 MED FILL — LISINOPRIL 40 MG TABLET: 40 | 30 days supply | Qty: 30 | Fill #0

## 2015-06-29 ENCOUNTER — Other Ambulatory Visit (HOSPITAL_COMMUNITY): Payer: Self-pay | Admitting: Oncology

## 2015-06-29 DIAGNOSIS — C7951 Secondary malignant neoplasm of bone: Principal | ICD-10-CM

## 2015-06-29 DIAGNOSIS — G893 Neoplasm related pain (acute) (chronic): Secondary | ICD-10-CM

## 2015-06-29 DIAGNOSIS — C50919 Malignant neoplasm of unspecified site of unspecified female breast: Secondary | ICD-10-CM

## 2015-06-29 MED ORDER — MORPHINE SULFATE ER 60 MG PO TBCR: EXTENDED_RELEASE_TABLET | ORAL | Status: DC

## 2015-06-30 ENCOUNTER — Encounter (HOSPITAL_COMMUNITY): Payer: Self-pay | Admitting: Hematology & Oncology

## 2015-06-30 ENCOUNTER — Encounter (HOSPITAL_COMMUNITY): Payer: Self-pay | Attending: Oncology | Admitting: Hematology & Oncology

## 2015-06-30 VITALS — BP 100/45 | HR 100 | Temp 98.0°F | Resp 18 | Wt 249.0 lb

## 2015-06-30 DIAGNOSIS — Z170 Estrogen receptor positive status [ER+]: Secondary | ICD-10-CM

## 2015-06-30 DIAGNOSIS — K760 Fatty (change of) liver, not elsewhere classified: Secondary | ICD-10-CM

## 2015-06-30 DIAGNOSIS — G893 Neoplasm related pain (acute) (chronic): Secondary | ICD-10-CM

## 2015-06-30 DIAGNOSIS — F411 Generalized anxiety disorder: Secondary | ICD-10-CM | POA: Insufficient documentation

## 2015-06-30 DIAGNOSIS — C50312 Malignant neoplasm of lower-inner quadrant of left female breast: Secondary | ICD-10-CM | POA: Insufficient documentation

## 2015-06-30 DIAGNOSIS — C7951 Secondary malignant neoplasm of bone: Secondary | ICD-10-CM | POA: Insufficient documentation

## 2015-06-30 DIAGNOSIS — C50919 Malignant neoplasm of unspecified site of unspecified female breast: Secondary | ICD-10-CM

## 2015-06-30 DIAGNOSIS — R938 Abnormal findings on diagnostic imaging of other specified body structures: Secondary | ICD-10-CM | POA: Insufficient documentation

## 2015-06-30 DIAGNOSIS — R978 Other abnormal tumor markers: Secondary | ICD-10-CM | POA: Insufficient documentation

## 2015-06-30 DIAGNOSIS — Z720 Tobacco use: Secondary | ICD-10-CM

## 2015-06-30 MED ORDER — MORPHINE SULFATE ER 60 MG PO TBCR: EXTENDED_RELEASE_TABLET | ORAL | Status: DC

## 2015-06-30 MED ORDER — PREGABALIN 75 MG PO CAPS: ORAL_CAPSULE | ORAL | Status: DC

## 2015-06-30 MED ORDER — OXYCODONE HCL 10 MG PO TABS: ORAL_TABLET | ORAL | Status: DC

## 2015-06-30 MED FILL — MORPHINE SULF 60 MG TAB SA: 60 | 30 days supply | Qty: 180 | Fill #0

## 2015-06-30 MED FILL — LYRICA 75 MG CAPSULE: 75 | 30 days supply | Qty: 120 | Fill #0

## 2015-06-30 MED FILL — oxyCODONE HCL 10 MG TABS: 10 | 12 days supply | Qty: 150 | Fill #0

## 2015-06-30 NOTE — Progress Notes (Signed)
Brittany Blair at [place] NOTE  No PCP Per Patient No address on file  Stage IV breast cancer, ER+, PR+ Her-2 neu-   Carcinoma of breast metastatic to bone ([place])   10/14/2013 Surgery laparoscopy assisted vaginal hysterectomy, laparoscopic bilateral salpingectomy, and cystoscopy    12/08/2013 Progression Iliac bone biopsy consistent with adenocarcinoma, ER/PR positive, HER-2/neu not overexpressed   12/23/2013 -  Chemotherapy Ibrance 100 mg daily for 3 weeks out of every 4 and letrozole 2.5 mg daily.   01/17/2014 Miscellaneous Xgeva 120 mg SQ every 28 days for bone metastases.   09/16/2014 Imaging Stable exam. Osseous metastatic disease is seen on the mostbrecent comparison study without substantial interval change.  No evidence for soft tissue metastases in the chest, abdomen, or pelvis.   09/22/2014 Imaging Bone scan- Stable abnormal uptake the medial aspect of the left clavicle, lateral aspects of 2 mid left ribs, and in the medial aspect of the left iliac bone. Slightly increased conspicuity of activity in the left inferior pubic ramus with new increase...   03/23/2015 Imaging Bone scan- Findings are stable compared to the nuclear medicine bone scan of 09/22/2014, as detailed above, compatible with the osseous metastases described on previous CT reports.  No new abnormality.   03/31/2015 Imaging CT CAP- Unchanged skeletal metastatic disease, as discussed above.  No signs of new extraskeletal metastatic disease identified on today's examination in the chest, abdomen or pelvis.     CURRENT THERAPY: Ibrance plus letrozole will be given with ibrance 100 mg daily for 3 weeks out of every 4 and letrozole 2.5 mg daily. Xgeva beginning on 01/17/2014.  INTERVAL HISTORY: Brittany Blair 55 y.o. female returns for followup of Metastatic breast cancer to bone after undergoing an iliac bone biopsy on 12/08/2013 demonstrating an ER/PR +, Her2 negative breast cancer. She was  started on Ibrance plus letrozole will be given with ibrance 100 mg daily for 3 weeks out of every 4 and letrozole 2.5 mg daily.   She returns to the [facility] today accompanied by her husband. She is in a wheelchair.  When asked what's going on, she says "pain. It's my hip bone. She makes the shape and describes her bone using hand gestures, saying "around there it feels like somebody took a bat and swung as hard as they could."  She says she hasn't been doing anything differently, but notes that her activity is less and less and less because "it just hurts so bad." She notes remembering that, last appointment, we discussed how she should do whatever she feels able to do in terms of activity.  She notes that her left knee hurts all the time bad  Her husband chimes in and says she had an incident last week after lunch where she slipped getting out of the bed and couldn't get up. She says this was the first time she's fallen in a long time because she's been really careful, but chose to fall on her knees and notes "I literally had to crawl and find something that I could use to get up with."  She knows that the less she does, the less she is going to be able to do. When asked what a normal day is for her in terms of activity, she begins talking about the pain again, noting that it's "changing." She says she started timing how long she could stand without the pain affecting her, and notes that "I just can't stand."  she says she can stand for 8 minutes, which isn't even enough time to make a sandwich. Then she notes that the whole "across her back" "goes into spasms." She notes the pain travelling into her leg and saying that her "knees buckle."  Her husband says that she will sit in the chair for a while until she gets uncomfortable, then get in the bed and sleep a little bit. He notes that whenever she has to get up, he has to help her, or she can use the lift chair.  She notes that the only thing  she can sit in is her lift chair. She says she went to bed at 11 last night and that she can't lay on her left side, even in spite of her 4 inch memory foam bed. She says she can't turn over "because I just can't." She notes "the things I do are with my arms, and thankfully my lymphedema has seemed to calm down some so I am able to use this arm." She notes that getting in and out of the car is excruciating and that the only thing she does is come see [place] here at [facility].  She says "it feels to me that, when I move, I'm just irritating everything." She was advised that, if you are in the habit of not using it, it's difficult to start using it again.  Her husband notes that they would like a higher dose of morphine, and they were advised again about tolerance.  Appetite remains excellent.   Past Medical History  Diagnosis Date  . Anxiety   . Herpes   . MRSA (methicillin resistant Staphylococcus aureus)   . Breast lump     left  . Hypertension   . Hematoma   . Swelling     left foot  . Multiple blisters     along surgical site  . Depression   . Genital herpes 08/04/2012  . PMB (postmenopausal bleeding) 06/23/2013  . Lymph edema   . Vaginal delivery 1981, 1984, 1987, 1992  . Breast cancer ([place]) dx'd 05/27/2010    chemo/xrt comp 01/2011  . Carcinoma of breast metastatic to bone ([place]) 05/28/2010    This patient presented in April with an invasive ductal carcinoma that had broken through the skin and was contaminated with MRSA. She was stage III. It was lower inner quadrant. It was receptor positive, HER-2/neu negative, and had a KI-67 of 75%.  She was treated with neoadjuvant chemotherapy. She was recently hospitalized (June, 2012,) with multiple superficial MRSA infections. These have all r  . B12 deficiency 05/12/2015    has Carcinoma of breast metastatic to bone [facility]); Syncope; GAD (generalized anxiety disorder); HTN (hypertension), benign; Chronic chest pain; Depression; Transaminitis;  Genital herpes; PMB (postmenopausal bleeding); Acute blood loss anemia; Abdominal pain; AKI (acute kidney injury) ([place]); GIB (gastrointestinal bleeding); Syncope and collapse; Prepyloric ulcer; Left sided numbness; Hypotension; Hypocalcemia; Infiltrating ductal carcinoma of left breast ([place]); Cancer related pain; H/O hysterectomy with oophorectomy; and B12 deficiency on her problem list.     is allergic to bee venom.  Current Outpatient Prescriptions on File Prior to Visit  Medication Sig Dispense Refill  . acyclovir (ZOVIRAX) 400 MG tablet TAKE 1 TABLET BY MOUTH TWICE DAILY 60 tablet 5  . ALPRAZolam (XANAX) 1 MG tablet TAKE 1 TABLET BY MOUTH 3 TIMES DAILY AS NEEDED FOR ANXIETY 90 tablet 5  . amLODipine (NORVASC) 5 MG tablet TAKE 1 TABLET BY MOUTH TWICE DAILY 180 tablet 4  .  bacitracin-polymyxin b (POLYSPORIN) ophthalmic ointment Place 1 application into both eyes 3 (three) times daily. apply 1 inch to each eye 3 times a day 3.5 g 0  . calcium-vitamin D (OSCAL WITH D) 500-200 MG-UNIT per tablet Take 2 tablets by mouth daily with breakfast. 60 tablet 11  . clotrimazole (LOTRIMIN) 1 % cream Apply 1 application topically 2 (two) times daily as needed (yeast rash).    . cyclobenzaprine (FLEXERIL) 10 MG tablet Take 1 tablet (10 mg total) by mouth 3 (three) times daily as needed for muscle spasms. 90 tablet 2  . letrozole (FEMARA) 2.5 MG tablet Take 1 tablet (2.5 mg total) by mouth daily. 90 tablet 3  . lisinopril (PRINIVIL,ZESTRIL) 40 MG tablet TAKE 1 TABLET BY MOUTH ONCE DAILY 30 tablet 3  . loratadine (CLARITIN) 10 MG tablet Take 10 mg by mouth daily as needed for allergies.     . metoprolol succinate (TOPROL-XL) 25 MG 24 hr tablet TAKE 3 TABLETS BY MOUTH ONCE DAILY WITH OR IMMEDIATELY FOLLOWING A MEAL 90 tablet 2  . OVER THE COUNTER MEDICATION Take 1 oz by mouth 2 (two) times daily. Liquid calcium with vitamin D    . palbociclib (IBRANCE) 100 MG capsule Take 1 capsule daily with food for 21 days,  rest 7 days, then resume taking the medicine 3 weeks on, 1 week off. 21 capsule 6  . polyethylene glycol (MIRALAX / GLYCOLAX) packet Take 8.5 g by mouth daily as needed for moderate constipation.    . promethazine (PHENERGAN) 25 MG tablet Take 1 tablet (25 mg total) by mouth every 6 (six) hours as needed for nausea or vomiting. 30 tablet 2  . temazepam (RESTORIL) 15 MG capsule Take 1-2 capsules (15-30 mg total) by mouth at bedtime as needed for sleep. 60 capsule 3  . tetrahydrozoline-zinc (VISINE-AC) 0.05-0.25 % ophthalmic solution Place 2 drops into both eyes 3 (three) times daily as needed (for allergies).     . traZODone (DESYREL) 50 MG tablet TAKE 1 TABLET BY MOUTH AT BEDTIME. 30 tablet 6  . venlafaxine XR (EFFEXOR-XR) 75 MG 24 hr capsule TAKE 3 CAPSULES BY MOUTH DAILY 90 capsule 2  . [DISCONTINUED] FLUoxetine (PROZAC) 20 MG tablet Take 40 mg by mouth daily.       No current facility-administered medications on file prior to visit.     Past Surgical History  Procedure Laterality Date  . Tubal ligation    . Wisdom tooth extraction    . Mastectomy modified radical  10/10/10     left -Dr Margot Chimes  . Laparoscopic assisted vaginal hysterectomy Bilateral 10/14/2013    Procedure: LAPAROSCOPIC ASSISTED VAGINAL HYSTERECTOMY;  Surgeon: Ena Dawley, MD;  Location: [facility];  Service: Gynecology;  Laterality: Bilateral;  . Cystoscopy N/A 10/14/2013    Procedure: CYSTOSCOPY;  Surgeon: Ena Dawley, MD;  Location: [facility];  Service: Gynecology;  Laterality: N/A;  . Laparoscopic lysis of adhesions N/A 10/14/2013    Procedure: LAPAROSCOPIC LYSIS OF ADHESIONS;  Surgeon: Ena Dawley, MD;  Location: [facility];  Service: Gynecology;  Laterality: N/A;  . Laparoscopic bilateral salpingo oopherectomy Bilateral 10/14/2013    Procedure: LAPAROSCOPIC BILATERAL SALPINGO OOPHORECTOMY;  Surgeon: Ena Dawley, MD;  Location: [facility];  Service: Gynecology;  Laterality: Bilateral;  . Esophagogastroduodenoscopy N/A  10/27/2013    Procedure: ESOPHAGOGASTRODUODENOSCOPY (EGD);  Surgeon: Wonda Horner, MD;  Location: [facility];  Service: Endoscopy;  Laterality: N/A;  . Bone biopsy Left 11/2013    left post. hip    Denies any  headaches, dizziness, double vision, fevers, chills, night sweats, nausea, vomiting, diarrhea, constipation, chest pain, heart palpitations, shortness of breath, blood in stool, black tarry stool, urinary pain, urinary burning, urinary frequency, hematuria.  14 point review of systems was performed and is negative except as detailed under history of present illness and above   PHYSICAL EXAMINATION  ECOG PERFORMANCE STATUS: 2 - Symptomatic, <50% confined to bed  Filed Vitals:   06/30/15 1200  BP: 100/45  Pulse: 100  Temp: 98 F (36.7 C)  Resp: 18    GENERAL:alert, no distress, well nourished, well developed, comfortable, cooperative, obese, in wheelchair SKIN: skin color, texture, turgor are normal, no rashes or significant lesions HEAD: Normocephalic, No masses, lesions, tenderness or abnormalities EYES: normal, PERRLA, EOMI, Conjunctiva are pink and non-injected  EARS: External ears normal OROPHARYNX:lips, buccal mucosa, and tongue normal and mucous membranes are moist  NECK: supple, no adenopathy, thyroid normal size, non-tender, without nodularity, no stridor, non-tender, trachea midline LYMPH:  no palpable lymphadenopathy, no hepatosplenomegaly LUNGS: clear to auscultation and percussion HEART: regular rate & rhythm, no murmurs and no gallops ABDOMEN:abdomen soft, non-tender and normal bowel sounds BACK: Back symmetric, no curvature., No CVA tenderness EXTREMITIES:less then 2 second capillary refill, no joint deformities, effusion, or inflammation, positive findings:  edema left upper extremity lymphedema with woody infiltration She is wearing a wrist brace on the R, when removed, no swelling or bruising of the extremity is noted, she can move her fingers without  difficulty NEURO: alert & oriented x 3 with fluent speech, no focal motor/sensory deficits, in wheelchair.   LABORATORY DATA: I have reviewed the data as listed.  Results for DANIYLA, PFAHLER (MRN [id number]) as of 06/30/2015 08:24  Ref. Range 06/16/2015 12:04  Sodium Latest Ref Range: 135-145 mmol/L 134 (L)  Potassium Latest Ref Range: 3.5-5.1 mmol/L 4.7  Chloride Latest Ref Range: 101-111 mmol/L 100 (L)  CO2 Latest Ref Range: 22-32 mmol/L 23  BUN Latest Ref Range: 6-20 mg/dL 42 (H)  Creatinine Latest Ref Range: 0.44-1.00 mg/dL 0.84  Calcium Latest Ref Range: 8.9-10.3 mg/dL 9.3  EGFR (Non-African Amer.) Latest Ref Range: >60 mL/min >60  EGFR (African American) Latest Ref Range: >60 mL/min >60  Glucose Latest Ref Range: 65-99 mg/dL 123 (H)  Anion gap Latest Ref Range: 5-15  11  Alkaline Phosphatase Latest Ref Range: 38-126 U/L 130 (H)  Albumin Latest Ref Range: 3.5-5.0 g/dL 4.1  AST Latest Ref Range: 15-41 U/L 45 (H)  ALT Latest Ref Range: 14-54 U/L 61 (H)  Total Protein Latest Ref Range: 6.5-8.1 g/dL 8.1  Total Bilirubin Latest Ref Range: 0.3-1.2 mg/dL 0.2 (L)  WBC Latest Ref Range: 4.0-10.5 K/uL 4.1  RBC Latest Ref Range: 3.87-5.11 MIL/uL 3.32 (L)  Hemoglobin Latest Ref Range: 12.0-15.0 g/dL 10.9 (L)  HCT Latest Ref Range: 36.0-46.0 % 32.3 (L)  MCV Latest Ref Range: 78.0-100.0 fL 97.3  MCH Latest Ref Range: 26.0-34.0 pg 32.8  MCHC Latest Ref Range: 30.0-36.0 g/dL 33.7  RDW Latest Ref Range: 11.5-15.5 % 12.9  Platelets Latest Ref Range: 150-400 K/uL 264  Neutrophils Latest Units: % 55  Lymphocytes Latest Units: % 32  Monocytes Relative Latest Units: % 10  Eosinophil Latest Units: % 2  Basophil Latest Units: % 1  NEUT# Latest Ref Range: 1.7-7.7 K/uL 2.3  Lymphocyte # Latest Ref Range: 0.7-4.0 K/uL 1.3  Monocyte # Latest Ref Range: 0.1-1.0 K/uL 0.4  Eosinophils Absolute Latest Ref Range: 0.0-0.7 K/uL 0.1  Basophils Absolute Latest Ref Range: 0.0-0.1 K/uL 0.0  CA 27.29 Latest  Ref Range: 0.0-38.6 U/mL 44.4 (H)    Lab Results  Component Value Date   LABCA2 44.4* 06/16/2015     RADIOLOGY:  CLINICAL DATA: Breast cancer. Metastatic disease. Pain.  EXAM: LUMBAR SPINE - COMPLETE 4+ VIEW  COMPARISON: CT 03/31/2015.  FINDINGS: Multilevel degenerative change. No acute bony abnormality identified. Mild scoliosis concave right. Distended loops of bowel noted suggesting adynamic ileus.  IMPRESSION: 1. No acute or focal bony abnormality. No focal lytic or sclerotic lesions noted in the lumbar spine. Mild diffuse degenerative change.  2. Distended loops of bowel noted. Adynamic ileus cannot be excluded.   Electronically Signed  By: Marcello Moores Register  On: 06/22/2015 15:00  CLINICAL DATA: Breast cancer. Metastatic disease.  EXAM: DG HIP (WITH OR WITHOUT PELVIS) 5+V BILAT  COMPARISON: CT 03/31/2015.  FINDINGS: Mixed lytic and blastic destructive changes noted of the left pubis and ischium consistent with metastatic disease. Similar finding noted on prior study. Metastatic foci in the iliac wings best demonstrated by prior CT. No acute abnormality identified. No evidence of fracture.  IMPRESSION: 1. Mixed lytic and blastic destructive process noted left pubis and ischium consistent with metastatic disease. Similar findings noted on prior CT of 03/31/2015. Previously identified iliac metastases best identified by prior CT.  2. No acute abnormality. No evidence of fracture.   Electronically Signed  By: Marcello Moores Register  On: 06/22/2015 15:04   CLINICAL DATA: 55 year old female with with history of stage IV breast cancer with metastatic disease to the bones.  EXAM: CT CHEST, ABDOMEN, AND PELVIS WITH CONTRAST  TECHNIQUE: Multidetector CT imaging of the chest, abdomen and pelvis was performed following the standard protocol during bolus administration of intravenous contrast.  CONTRAST: 160m OMNIPAQUE IOHEXOL  300 MG/ML SOLN  COMPARISON: CT of the chest, abdomen and pelvis 09/16/2014.  FINDINGS: CT CHEST FINDINGS  Mediastinum/Lymph Nodes: Heart size is normal. There is no significant pericardial fluid, thickening or pericardial calcification. No pathologically enlarged mediastinal, internal mammary or hilar lymph nodes. Esophagus is unremarkable in appearance. No axillary lymphadenopathy. Surgical clips in the left axilla compatible with prior lymph node dissection.  Lungs/Pleura: No suspicious appearing pulmonary nodules or masses. No acute consolidative airspace disease. No pleural effusions. Tiny 2 mm subpleural nodule in the periphery of the right lower lobe is unchanged, presumably a subpleural lymph node. Subpleural reticulation and ground-glass attenuation in the apex of the left upper lobe is unchanged, presumably related to prior left axillary radiation therapy.  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 Sclerotic lesion in the right side of the manubrium it is also unchanged. No new suspicious osseous lesions are noted. Diffuse soft tissue edema throughout the visualized portions of the left upper extremity, similar to prior examination, likely to reflect a lymphedema.  CT ABDOMEN AND PELVIS FINDINGS  Hepatobiliary: Diffuse low attenuation throughout the hepatic parenchyma, compatible with hepatic steatosis. No suspicious cystic or solid hepatic lesions are noted. No intra or extrahepatic biliary ductal dilatation. Gallbladder is normal in appearance.  Pancreas: No pancreatic mass. No pancreatic ductal dilatation. No pancreatic or peripancreatic fluid or inflammatory changes.  Spleen: Unremarkable.  Adrenals/Urinary Tract: Bilateral adrenal glands and bilateral kidneys are normal in appearance. No hydroureteronephrosis. Urinary bladder is normal in  appearance.  Stomach/Bowel: Normal appearance of the stomach. No pathologic dilatation of small bowel or colon. The appendix is not confidently identified may be surgically absent. Regardless, there are no inflammatory changes noted adjacent  to the cecum to suggest presence of an acute appendicitis at this time.  Vascular/Lymphatic: Atherosclerosis throughout the abdominal and pelvic vasculature, without evidence of aneurysm or dissection. No lymphadenopathy noted in the abdomen or pelvis.  Reproductive: Status post hysterectomy. Ovaries are not confidently identified may be surgically absent or atrophic.  Other: No significant volume of ascites. No pneumoperitoneum.  Musculoskeletal: Multiple osseous lesions are again noted, most evident in the pelvis. The largest of these are mixed lytic and sclerotic lesions involving predominantly the left inferior pubic ramus and the left ilium. These appear grossly unchanged compared to the prior study. No new osseous lesions are noted.  IMPRESSION: 1. Unchanged skeletal metastatic disease, as discussed above. 2. No signs of new extraskeletal metastatic disease identified on today's examination in the chest, abdomen or pelvis. 3. Status post left modified radical mastectomy and left axillary nodal dissection, with lymphedema in the left arm, similar to the prior examination, as above. 4. Hepatic steatosis. 5. Atherosclerosis. 6. Additional incidental findings, as above.   Electronically Signed  By: Vinnie Langton M.D.  On: 03/31/2015 15:13    ASSESSMENT AND PLAN:  Stage IV ER+, PR+ Her-2 neu - breast cancer Ongoing Tobacco Use Cancer Related Pain Hepatic Steatosis Inactivity, deconditioning  She is to continue on her ibrance and femara. Scans were reviewed with the patient and her husband. Disease is stable.  She is not interested in discontinuing smoking and we discussed the health benefits of smoking cessation. I  addressed ways we could help her quit, but again the patient declines.  I advised her that I am not convinced that continuing to escalate her pain medications are the solution for her. Her pain is knee pain, low back pain and leg weakness. She is profoundly deconditioned.  She spends her days going from a lift chair to bed. During our conversation she became agitated when I suggested that therapy may be the best course for her.  We can increase her Lyrica. I have increased her long acting morphine. Beyond this, I have discussed a pain management referral.  I advised her that, at some point, she is going to have to make the decision to do physical rehabilitation or not. I advised her that as time goes on, the longer she puts it off, she is going to get more and more disabled.   Her next follow up is two weeks from today, the 19th.  Orders Placed This Encounter  Procedures  . Comprehensive metabolic panel    Standing Status: Standing     Number of Occurrences: 6     Standing Expiration Date: 06/29/2016   All questions were answered. The patient knows to call the clinic with any problems, questions or concerns. We can certainly see the patient much sooner if necessary.  This document serves as a record of services personally performed by Ancil Linsey, MD. It was created on her behalf by Toni Amend, a trained medical scribe. The creation of this record is based on the scribe's personal observations and the provider's statements to them. This document has been checked and approved by the attending provider.  I have reviewed the above documentation for accuracy and completeness, and I agree with the above.  Kelby Fam. Penland MD

## 2015-06-30 NOTE — Patient Instructions (Signed)
[facility] at [facility] Discharge Instructions  RECOMMENDATIONS MADE BY THE CONSULTANT AND ANY TEST RESULTS WILL BE SENT TO YOUR REFERRING PHYSICIAN.   Exam and discussion by Dr Whitney Muse today Increased your lyrica to 2 tablets twice a day Increased your morphine long acting to 2 tablets in the am, 2 tablets in the afternoon, 2 tablets at night Oxycodone refilled Return to see the doctor as scheduled X-geva as scheduled  Please call the clinic if you have any questions or concerns      Thank you for choosing [facility] at [facility] to provide your oncology and hematology care.  To afford each patient quality time with our provider, please arrive at least 15 minutes before your scheduled appointment time.   Beginning January 23rd 2017 lab work for the [facility] will be done in the  Main lab at [facility] on 1st floor. If you have a lab appointment with the [facility] please come in thru the  Main Entrance and check in at the main information desk  You need to re-schedule your appointment should you arrive 10 or more minutes late.  We strive to give you quality time with our providers, and arriving late affects you and other patients whose appointments are after yours.  Also, if you no show three or more times for appointments you may be dismissed from the clinic at the providers discretion.     Again, thank you for choosing [facility].  Our hope is that these requests will decrease the amount of time that you wait before being seen by our physicians.       _____________________________________________________________  Should you have questions after your visit to [facility], please contact our office at [phone] between the hours of 8:30 a.m. and 4:30 p.m.  Voicemails left after 4:30 p.m. will not be returned until the following business day.  For prescription refill requests, have your pharmacy  contact our office.         Resources For Cancer Patients and their Caregivers ? American Cancer Society: Can assist with transportation, wigs, general needs, runs Look Good Feel Better.        650-391-2169 ? Cancer Care: Provides financial assistance, online support groups, medication/co-pay assistance.  1-800-813-HOPE 231-816-7431) ? [facility] Assists [place] cancer patients and their families through emotional , educational and financial support.  (539)375-6014 ? [place] DSS Where to apply for food stamps, Medicaid and utility assistance. 219-049-2722 ? [facility]: Transportation to medical appointments. 215-645-3976 ? Social Security Administration: May apply for disability if have a Stage IV cancer. 860-267-2517 910-342-7071 ? [employer], Disability and Transit Services: Assists with nutrition, care and transit needs. 902-320-2902

## 2015-07-04 ENCOUNTER — Encounter (HOSPITAL_COMMUNITY): Payer: Self-pay | Admitting: Hematology & Oncology

## 2015-07-05 MED FILL — ACYCLOVIR 400 MG TABLET: 400 | 30 days supply | Qty: 60 | Fill #1

## 2015-07-07 ENCOUNTER — Ambulatory Visit (HOSPITAL_COMMUNITY): Payer: Self-pay | Admitting: Hematology & Oncology

## 2015-07-07 MED FILL — TEMAZEPAM 15 MG CAPSULE: 15 | 30 days supply | Qty: 60 | Fill #3

## 2015-07-14 ENCOUNTER — Encounter (HOSPITAL_COMMUNITY): Payer: Self-pay

## 2015-07-14 ENCOUNTER — Encounter (HOSPITAL_BASED_OUTPATIENT_CLINIC_OR_DEPARTMENT_OTHER): Payer: Self-pay

## 2015-07-14 ENCOUNTER — Encounter (HOSPITAL_BASED_OUTPATIENT_CLINIC_OR_DEPARTMENT_OTHER): Payer: Self-pay | Admitting: Oncology

## 2015-07-14 VITALS — BP 107/75 | HR 103 | Temp 98.6°F | Resp 20 | Wt 247.9 lb

## 2015-07-14 DIAGNOSIS — Z170 Estrogen receptor positive status [ER+]: Secondary | ICD-10-CM

## 2015-07-14 DIAGNOSIS — C50919 Malignant neoplasm of unspecified site of unspecified female breast: Secondary | ICD-10-CM

## 2015-07-14 DIAGNOSIS — C7951 Secondary malignant neoplasm of bone: Secondary | ICD-10-CM

## 2015-07-14 DIAGNOSIS — G893 Neoplasm related pain (acute) (chronic): Secondary | ICD-10-CM

## 2015-07-14 DIAGNOSIS — E538 Deficiency of other specified B group vitamins: Secondary | ICD-10-CM

## 2015-07-14 LAB — COMPREHENSIVE METABOLIC PANEL
ALT: 67 U/L — AB (ref 14–54)
AST: 45 U/L — AB (ref 15–41)
Albumin: 4 g/dL (ref 3.5–5.0)
Alkaline Phosphatase: 189 U/L — ABNORMAL HIGH (ref 38–126)
Anion gap: 10 (ref 5–15)
BUN: 42 mg/dL — AB (ref 6–20)
CHLORIDE: 91 mmol/L — AB (ref 101–111)
CO2: 26 mmol/L (ref 22–32)
CREATININE: 0.73 mg/dL (ref 0.44–1.00)
Calcium: 8.9 mg/dL (ref 8.9–10.3)
GFR calc Af Amer: >60 mL/min (ref >60)
GFR calc non Af Amer: >60 mL/min (ref >60)
Glucose, Bld: 139 mg/dL — ABNORMAL HIGH (ref 65–99)
Potassium: 4.2 mmol/L (ref 3.5–5.1)
SODIUM: 127 mmol/L — AB (ref 135–145)
Total Bilirubin: 0.5 mg/dL (ref 0.3–1.2)
Total Protein: 7.9 g/dL (ref 6.5–8.1)

## 2015-07-14 LAB — VITAMIN B12: VITAMIN B 12: 392 pg/mL (ref 180–914)

## 2015-07-14 MED ORDER — CYANOCOBALAMIN 1000 MCG/ML IJ SOLN: 1000.0000 ug | Freq: Once | INTRAMUSCULAR | Status: DC

## 2015-07-14 MED ORDER — DENOSUMAB 120 MG/1.7ML ~~LOC~~ SOLN
120.0000 mg | Freq: Once | SUBCUTANEOUS | Status: AC
  Administered 2015-07-14: 120 mg via SUBCUTANEOUS
  Filled 2015-07-14: qty 1.7

## 2015-07-14 NOTE — Assessment & Plan Note (Addendum)
Low-normal B12.  Antibody testing on 05/16/2015 demonstrates an negative intrinsic factor antibody, but a elevated antiparietal cell antibody. Intrinsic factor antibody is much more specific for pernicious anemia and antiparietal cell antibody has a low specificity but elevated sensitivity. At this time,the patient is deemed not to have pernicious anemia.   She is status post 4 weekly B12 injections ending on 06/05/2015.  She is to continue by mouth B12 1000 g to 2000 g daily.

## 2015-07-14 NOTE — Progress Notes (Signed)
No PCP Per Patient No address on file  Carcinoma of breast metastatic to bone, unspecified laterality ([place])  B12 deficiency  CURRENT THERAPY: Ibrance 100 mg daily 3/1 fashion plus letrozole daily. Xgeva beginning on 01/17/2014.  INTERVAL HISTORY: Brittany Blair 55 y.o. female returns for followup of Metastatic breast cancer to bone after undergoing an iliac bone biopsy on 12/08/2013 demonstrating an ER/PR +, Her2 negative breast cancer. She was started on Ibrance plus letrozole will be given with ibrance 100 mg daily for 3 weeks out of every 4 and letrozole 2.5 mg daily.    Carcinoma of breast metastatic to bone ([place])   10/14/2013 Surgery laparoscopy assisted vaginal hysterectomy, laparoscopic bilateral salpingectomy, and cystoscopy    12/08/2013 Progression Iliac bone biopsy consistent with adenocarcinoma, ER/PR positive, HER-2/neu not overexpressed   12/23/2013 -  Chemotherapy Ibrance 100 mg daily for 3 weeks out of every 4 and letrozole 2.5 mg daily.   01/17/2014 Miscellaneous Xgeva 120 mg SQ every 28 days for bone metastases.   09/16/2014 Imaging Stable exam. Osseous metastatic disease is seen on the mostbrecent comparison study without substantial interval change.  No evidence for soft tissue metastases in the chest, abdomen, or pelvis.   09/22/2014 Imaging Bone scan- Stable abnormal uptake the medial aspect of the left clavicle, lateral aspects of 2 mid left ribs, and in the medial aspect of the left iliac bone. Slightly increased conspicuity of activity in the left inferior pubic ramus with new increase...   03/23/2015 Imaging Bone scan- Findings are stable compared to the nuclear medicine bone scan of 09/22/2014, as detailed above, compatible with the osseous metastases described on previous CT reports.  No new abnormality.   03/31/2015 Imaging CT CAP- Unchanged skeletal metastatic disease, as discussed above.  No signs of new extraskeletal metastatic disease identified on  today's examination in the chest, abdomen or pelvis.    I personally reviewed and went over laboratory results with the patient.  The results are noted within this dictation.  Labs were updated today.  Dr. Whitney Muse had a very frank conversation with the patient regarding her pain medication needs and deconditioning at her last follow-up appointment approximately 2 weeks ago.  She had recommended physical rehabilitation. She did increase the patient's Lyrica and long-acting pain medication. The patient is advised that we will not continue to escalate her pain medications and if further pain management is necessary, she will be referred to a pain management specialist. Her pain is not related to her cancer according to imaging studies recently. According to documentation, the patient got very frustrated and agitated during this particular aspect of the conversation.  Her weight is stable overall, down 1 lbs since 2 weeks ago, but overall her wight is up about 25 lbs since September 2016.  She notes an improvement in her pain and mobility since a recent increase in Lyrica by Dr. Whitney Muse on 5/5.  Her husband reports that she is much improved with regards to her pain control.  Review of Systems  Constitutional: Negative.  Negative for fever, chills and weight loss.  HENT: Negative.   Eyes: Negative.   Respiratory: Negative.   Cardiovascular: Negative.   Gastrointestinal: Negative.  Negative for nausea, vomiting, diarrhea, constipation, blood in stool and melena.  Genitourinary: Negative.  Negative for dysuria.  Musculoskeletal: Positive for back pain (chronic) and joint pain (chronic).  Skin: Negative.   Neurological: Negative.   Endo/Heme/Allergies: Negative.   Psychiatric/Behavioral: Negative.     Past  Medical History  Diagnosis Date  . Anxiety   . Herpes   . MRSA (methicillin resistant Staphylococcus aureus)   . Breast lump     left  . Hypertension   . Hematoma   . Swelling     left  foot  . Multiple blisters     along surgical site  . Depression   . Genital herpes 08/04/2012  . PMB (postmenopausal bleeding) 06/23/2013  . Lymph edema   . Vaginal delivery 1981, 1984, 1987, 1992  . Breast cancer ([place]) dx'd 05/27/2010    chemo/xrt comp 01/2011  . Carcinoma of breast metastatic to bone ([place]) 05/28/2010    This patient presented in April with an invasive ductal carcinoma that had broken through the skin and was contaminated with MRSA. She was stage III. It was lower inner quadrant. It was receptor positive, HER-2/neu negative, and had a KI-67 of 75%.  She was treated with neoadjuvant chemotherapy. She was recently hospitalized (June, 2012,) with multiple superficial MRSA infections. These have all r  . B12 deficiency 05/12/2015    Past Surgical History  Procedure Laterality Date  . Tubal ligation    . Wisdom tooth extraction    . Mastectomy modified radical  10/10/10     left -Dr Margot Chimes  . Laparoscopic assisted vaginal hysterectomy Bilateral 10/14/2013    Procedure: LAPAROSCOPIC ASSISTED VAGINAL HYSTERECTOMY;  Surgeon: Ena Dawley, MD;  Location: [facility];  Service: Gynecology;  Laterality: Bilateral;  . Cystoscopy N/A 10/14/2013    Procedure: CYSTOSCOPY;  Surgeon: Ena Dawley, MD;  Location: [facility];  Service: Gynecology;  Laterality: N/A;  . Laparoscopic lysis of adhesions N/A 10/14/2013    Procedure: LAPAROSCOPIC LYSIS OF ADHESIONS;  Surgeon: Ena Dawley, MD;  Location: [facility];  Service: Gynecology;  Laterality: N/A;  . Laparoscopic bilateral salpingo oopherectomy Bilateral 10/14/2013    Procedure: LAPAROSCOPIC BILATERAL SALPINGO OOPHORECTOMY;  Surgeon: Ena Dawley, MD;  Location: [facility];  Service: Gynecology;  Laterality: Bilateral;  . Esophagogastroduodenoscopy N/A 10/27/2013    Procedure: ESOPHAGOGASTRODUODENOSCOPY (EGD);  Surgeon: Wonda Horner, MD;  Location: [facility];  Service: Endoscopy;  Laterality: N/A;  . Bone biopsy Left 11/2013    left post. hip     Family History  Problem Relation Age of Onset  . Heart disease Father     heart attack  . Hypertension Daughter   . Hypertension Son   . Cancer Maternal Grandmother     ovarian  . Hypertension Maternal Grandfather   . Cancer Paternal Grandmother     ovarian  . Hypertension Paternal Grandfather     Social History   Social History  . Marital Status: Married    Spouse Name: N/A  . Number of Children: N/A  . Years of Education: N/A   Social History Main Topics  . Smoking status: Current Every Day Smoker -- 0.35 packs/day    Types: Cigarettes    Last Attempt to Quit: 09/26/2013  . Smokeless tobacco: Never Used  . Alcohol Use: 12.6 oz/week    21 Glasses of wine per week  . Drug Use: No  . Sexual Activity: Not Currently    Birth Control/ Protection: None   Other Topics Concern  . Not on file   Social History Narrative     PHYSICAL EXAMINATION  ECOG PERFORMANCE STATUS: 2 - Symptomatic, <50% confined to bed  Filed Vitals:   07/14/15 1339  BP: 107/75  Pulse: 103  Temp: 98.6 F (37 C)  Resp: 20    GENERAL:alert,  no distress, well nourished, well developed, comfortable, cooperative, obese, smiling and in a wheelchair, accompanied by her husband SKIN: skin color, texture, turgor are normal, no rashes or significant lesions HEAD: Normocephalic, No masses, lesions, tenderness or abnormalities EYES: normal, Conjunctiva are pink and non-injected EARS: External ears normal OROPHARYNX:lips, buccal mucosa, and tongue normal and mucous membranes are moist  NECK: supple, trachea midline LYMPH:  not examined BREAST:not examined LUNGS: clear to auscultation  HEART: regular rate & rhythm ABDOMEN:abdomen soft, obese and normal bowel sounds BACK: Back symmetric, no curvature. EXTREMITIES:less then 2 second capillary refill, no joint deformities, effusion, or inflammation, no skin discoloration, no cyanosis, positive findings:  Left upper extremity lymphedema.  NEURO:  alert & oriented x 3 with fluent speech, in wheelchair   LABORATORY DATA: CBC    Component Value Date/Time   WBC 4.1 06/16/2015 1204   WBC 7.1 01/24/2012 1539   RBC 3.32* 06/16/2015 1204   RBC 3.00* 10/27/2013 0800   RBC 4.39 01/24/2012 1539   HGB 10.9* 06/16/2015 1204   HGB 14.7 01/24/2012 1539   HCT 32.3* 06/16/2015 1204   HCT 43.7 01/24/2012 1539   PLT 264 06/16/2015 1204   PLT 258 01/24/2012 1539   MCV 97.3 06/16/2015 1204   MCV 99.5 01/24/2012 1539   MCH 32.8 06/16/2015 1204   MCH 33.5 01/24/2012 1539   MCHC 33.7 06/16/2015 1204   MCHC 33.6 01/24/2012 1539   RDW 12.9 06/16/2015 1204   RDW 14.1 01/24/2012 1539   LYMPHSABS 1.3 06/16/2015 1204   LYMPHSABS 1.4 01/24/2012 1539   MONOABS 0.4 06/16/2015 1204   MONOABS 0.6 01/24/2012 1539   EOSABS 0.1 06/16/2015 1204   EOSABS 0.3 01/24/2012 1539   BASOSABS 0.0 06/16/2015 1204   BASOSABS 0.0 01/24/2012 1539      Chemistry      Component Value Date/Time   NA 127* 07/14/2015 1305   K 4.2 07/14/2015 1305   CL 91* 07/14/2015 1305   CO2 26 07/14/2015 1305   BUN 42* 07/14/2015 1305   CREATININE 0.73 07/14/2015 1305      Component Value Date/Time   CALCIUM 8.9 07/14/2015 1305   ALKPHOS 189* 07/14/2015 1305   AST 45* 07/14/2015 1305   ALT 67* 07/14/2015 1305   BILITOT 0.5 07/14/2015 1305      Lab Results  Component Value Date   LABCA2 44.4* 06/16/2015    PENDING LABS:   RADIOGRAPHIC STUDIES:  Dg Lumbar Spine Complete  06/22/2015  CLINICAL DATA:  Breast cancer.  Metastatic disease.  Pain. EXAM: LUMBAR SPINE - COMPLETE 4+ VIEW COMPARISON:  CT 03/31/2015. FINDINGS: Multilevel degenerative change. No acute bony abnormality identified. Mild scoliosis concave right. Distended loops of bowel noted suggesting adynamic ileus. IMPRESSION: 1. No acute or focal bony abnormality. No focal lytic or sclerotic lesions noted in the lumbar spine. Mild diffuse degenerative change. 2. Distended loops of bowel noted. Adynamic ileus  cannot be excluded. Electronically Signed   By: Marcello Moores  Register   On: 06/22/2015 15:00   Dg Hips Bilat With Pelvis Min 5 Views  06/22/2015  CLINICAL DATA:  Breast cancer.  Metastatic disease. EXAM: DG HIP (WITH OR WITHOUT PELVIS) 5+V BILAT COMPARISON:  CT 03/31/2015. FINDINGS: Mixed lytic and blastic destructive changes noted of the left pubis and ischium consistent with metastatic disease. Similar finding noted on prior study. Metastatic foci in the iliac wings best demonstrated by prior CT. No acute abnormality identified. No evidence of fracture. IMPRESSION: 1. Mixed lytic and blastic destructive process noted  left pubis and ischium consistent with metastatic disease. Similar findings noted on prior CT of 03/31/2015. Previously identified iliac metastases best identified by prior CT. 2. No acute abnormality.  No evidence of fracture. Electronically Signed   By: Marcello Moores  Register   On: 06/22/2015 15:04     PATHOLOGY:    ASSESSMENT AND PLAN:  Carcinoma of breast metastatic to bone ER/PR+, Her2 negative malignancy, on Letrozole and Ibrance 100 mg daily 3 weeks on and 1 week off every 4 weeks.  Tolerating well thus far.  On Xgeva therapy for bone metastases with Ca++ and Vit D to prevent hypocalcemia associated with Xgeva.    Labs today as ordered.  Labs in 4 and 8 weeks: CBC diff, CMET, CA 27.29  Continue treatment as planned and outlined above.    Xgeva today and every 4 weeks.  Please see encounter from 06/30/2015 by Dr. Whitney Muse regarding the patient's pain medication requirements/needs.  In summary, the patient's pain medication requirements have been escalating over the past year. She has undergone imaging tests that demonstrate stability of her cancer/disease.  As a result continuing escalation of her pain medication is not be in the patient's best interest.  On her 06/30/2015 encounter, Dr. Whitney Muse increases the patient's Lyrica and long-acting pain medication. If she continues to need pain  medication escalation, she will need to be referred to pain management specialist. Patient was advised that she needs physical rehabilitation due to persistent and worsening deconditioning. Patient was very frustrated and agitated during this conversation regarding her pain medication needs and physical rehabilitation.  Brittany Blair and her husband report their interaction with Dr. Whitney Muse 2 weeks ago.  They were not upset or agitated about the encounter, but more inquiring about some of the aspects of the conversation.  Deatra reports that she was told that she has arthritis which is the major contributor to her pain at this time, not malignancy.  She notes that she was advised that she may need physical therapy to help with her deconditioning.  She is not interested in this.  "I want to know where my arthritis is, if I have it."  She is advised that I will review her imaging and let her know more about this issue at her follow-up appointment.  I have reviewed her scans and there is degenerative lumbar changes associated with arthritis which could explain her back pain.  I will review this with the patient in the future.  She will need educated regarding arthritis, arthritic pain, and the association of progression and worsening symptoms associated with arthritis.  She needs further discouragement of sedentary lifestyle and I will discuss physical therapy/rehabilitation with the patient.  Her disease is stable.  I think it is reasonable to continue to monitor her labs with Xgeva monthly and see her back for follow-up in 2 months.  B12 deficiency Low-normal B12.  Antibody testing on 05/16/2015 demonstrates an negative intrinsic factor antibody, but a elevated antiparietal cell antibody. Intrinsic factor antibody is much more specific for pernicious anemia and antiparietal cell antibody has a low specificity but elevated sensitivity. At this time,the patient is deemed not to have pernicious anemia.   She is status  post 4 weekly B12 injections ending on 06/05/2015.  She is to continue by mouth B12 1000 g to 2000 g daily.    ORDERS PLACED FOR THIS ENCOUNTER: No orders of the defined types were placed in this encounter.    MEDICATIONS PRESCRIBED THIS ENCOUNTER: No orders of the defined types  were placed in this encounter.    THERAPY PLAN:  Continue with treatment as outlined above.  All questions were answered. The patient knows to call the clinic with any problems, questions or concerns. We can certainly see the patient much sooner if necessary.  Patient and plan discussed with Dr. Ancil Linsey and she is in agreement with the aforementioned.   This note is electronically signed by: Doy Mince 07/14/2015 3:02 PM

## 2015-07-14 NOTE — Patient Instructions (Signed)
[place] at [facility] Discharge Instructions  RECOMMENDATIONS MADE BY THE CONSULTANT AND ANY TEST RESULTS WILL BE SENT TO YOUR REFERRING PHYSICIAN.  Your calcium today was within normal limits and therefore she was given today. You have completed 4 weekly treatments of B12 injections. Now, you can take B12 by mouth daily, 1000- 2000 g daily. Continue Ibrance 100 mg daily weeks on and 1 week plus letrozole daily.  Thank you for choosing [place] at [facility] to provide your oncology and hematology care.  To afford each patient quality time with our provider, please arrive at least 15 minutes before your scheduled appointment time.   Beginning January 23rd 2017 lab work for the [facility] will be done in the  Main lab at [facility] on 1st floor. If you have a lab appointment with the [facility] please come in thru the  Main Entrance and check in at the main information desk  You need to re-schedule your appointment should you arrive 10 or more minutes late.  We strive to give you quality time with our providers, and arriving late affects you and other patients whose appointments are after yours.  Also, if you no show three or more times for appointments you may be dismissed from the clinic at the providers discretion.     Again, thank you for choosing [facility].  Our hope is that these requests will decrease the amount of time that you wait before being seen by our physicians.       _____________________________________________________________  Should you have questions after your visit to [facility], please contact our office at [phone] between the hours of 8:30 a.m. and 4:30 p.m.  Voicemails left after 4:30 p.m. will not be returned until the following business day.  For prescription refill requests, have your pharmacy contact our office.         Resources For Cancer Patients and their  Caregivers ? American Cancer Society: Can assist with transportation, wigs, general needs, runs Look Good Feel Better.        787-502-4465 ? Cancer Care: Provides financial assistance, online support groups, medication/co-pay assistance.  1-800-813-HOPE 445 380 5128) ? [place] Assists [place] cancer patients and their families through emotional , educational and financial support.  404-508-2849 ? [place] DSS Where to apply for food stamps, Medicaid and utility assistance. (704)155-5673 ? [facility]: Transportation to medical appointments. 440-570-1815 ? Social Security Administration: May apply for disability if have a Stage IV cancer. (804)723-9226 (609)845-4207 ? [employer], Disability and Transit Services: Assists with nutrition, care and transit needs. [place] Support Programs: @10RELATIVEDAYS @ > Cancer Support Group  2nd Tuesday of the month 1pm-2pm, Journey Room  > Creative Journey  3rd Tuesday of the month 1130am-1pm, Journey Room  > Look Good Feel Better  1st Wednesday of the month 10am-12 noon, Journey Room (Call [place] to register [phone])

## 2015-07-14 NOTE — Progress Notes (Signed)
Titia Chio presents today for injection per MD orders. XGEVA 120mg  administered SQ in right Upper Arm. Administration without incident. Patient tolerated well.

## 2015-07-14 NOTE — Assessment & Plan Note (Addendum)
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  Tolerating well thus far.  On Xgeva therapy for bone metastases with Ca++ and Vit D to prevent hypocalcemia associated with Xgeva.    Labs today as ordered.  Labs in 4 and 8 weeks: CBC diff, CMET, CA 27.29  Continue treatment as planned and outlined above.    Xgeva today and every 4 weeks.  Please see encounter from 06/30/2015 by Dr. Whitney Muse regarding the patient's pain medication requirements/needs.  In summary, the patient's pain medication requirements have been escalating over the past year. She has undergone imaging tests that demonstrate stability of her cancer/disease.  As a result continuing escalation of her pain medication is not be in the patient's best interest.  On her 06/30/2015 encounter, Dr. Whitney Muse increases the patient's Lyrica and long-acting pain medication. If she continues to need pain medication escalation, she will need to be referred to pain management specialist. Patient was advised that she needs physical rehabilitation due to persistent and worsening deconditioning. Patient was very frustrated and agitated during this conversation regarding her pain medication needs and physical rehabilitation.  Brittany Blair and her husband report their interaction with Dr. Whitney Muse 2 weeks ago.  They were not upset or agitated about the encounter, but more inquiring about some of the aspects of the conversation.  Brittany Blair reports that she was told that she has arthritis which is the major contributor to her pain at this time, not malignancy.  She notes that she was advised that she may need physical therapy to help with her deconditioning.  She is not interested in this.  "I want to know where my arthritis is, if I have it."  She is advised that I will review her imaging and let her know more about this issue at her follow-up appointment.  I have reviewed her scans and there is degenerative  lumbar changes associated with arthritis which could explain her back pain.  I will review this with the patient in the future.  She will need educated regarding arthritis, arthritic pain, and the association of progression and worsening symptoms associated with arthritis.  She needs further discouragement of sedentary lifestyle and I will discuss physical therapy/rehabilitation with the patient.  Her disease is stable.  I think it is reasonable to continue to monitor her labs with Xgeva monthly and see her back for follow-up in 2 months.

## 2015-07-17 MED FILL — traZODone HCL 50 MG TABS: 50 | 30 days supply | Qty: 30 | Fill #2

## 2015-07-17 MED FILL — CYCLOBENZAPRINE 10 MG TAB: 10 | 30 days supply | Qty: 90 | Fill #1

## 2015-07-18 ENCOUNTER — Other Ambulatory Visit (HOSPITAL_COMMUNITY): Payer: Self-pay | Admitting: Oncology

## 2015-07-18 DIAGNOSIS — J029 Acute pharyngitis, unspecified: Secondary | ICD-10-CM

## 2015-07-19 ENCOUNTER — Other Ambulatory Visit (HOSPITAL_COMMUNITY): Payer: Self-pay

## 2015-07-21 MED FILL — ALPRAZolam 1 MG TABS: 1 | 30 days supply | Qty: 90 | Fill #2

## 2015-07-27 ENCOUNTER — Other Ambulatory Visit (HOSPITAL_COMMUNITY): Payer: Self-pay | Admitting: Oncology

## 2015-07-27 DIAGNOSIS — C7951 Secondary malignant neoplasm of bone: Principal | ICD-10-CM

## 2015-07-27 DIAGNOSIS — G893 Neoplasm related pain (acute) (chronic): Secondary | ICD-10-CM

## 2015-07-27 DIAGNOSIS — C50919 Malignant neoplasm of unspecified site of unspecified female breast: Secondary | ICD-10-CM

## 2015-07-27 MED ORDER — MORPHINE SULFATE ER 60 MG PO TBCR: EXTENDED_RELEASE_TABLET | ORAL | Status: DC

## 2015-07-27 MED ORDER — OXYCODONE HCL 10 MG PO TABS: ORAL_TABLET | ORAL | Status: DC

## 2015-07-27 MED FILL — LISINOPRIL 40 MG TABLET: 40 | 30 days supply | Qty: 30 | Fill #1

## 2015-07-28 MED FILL — MORPHINE SULF 60 MG TAB SA: 60 | 30 days supply | Qty: 180 | Fill #0

## 2015-07-28 MED FILL — TEMAZEPAM 15 MG CAPSULE: 15 | 30 days supply | Qty: 60 | Fill #0

## 2015-07-28 MED FILL — oxyCODONE HCL 10 MG TABS: 10 | 12 days supply | Qty: 150 | Fill #0

## 2015-08-01 IMAGING — CT CT ABD-PELV W/ CM
1 of 5 series · 13 of 32 positions shown, 18 images · IV contrast (OMNIPAQUE 300)
Comparison: None.

CLINICAL DATA: Anemia, recent hysterectomy, lower abdominal
pressure. Rectal bleeding.

EXAM:
CT ABDOMEN AND PELVIS WITH CONTRAST
TECHNIQUE: Multidetector CT imaging of the abdomen and pelvis was performed
using the standard protocol following bolus administration of
intravenous contrast.
CONTRAST:  50mL OMNIPAQUE IOHEXOL 300 MG/ML SOLN, 100mL OMNIPAQUE
IOHEXOL 300 MG/ML SOLN

[Series 2: abd/pel with · axial · 0.74mm/px · z∈[+1040,+1400]mm · 13 of 84 slices shown, 18 images]
[im 6/84  soft-tissue]
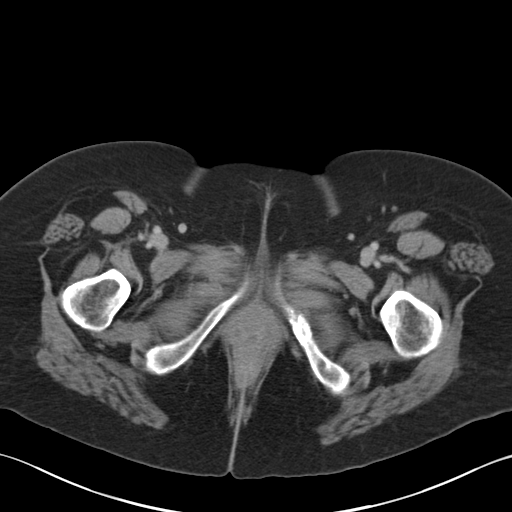
[im 6/84  bone]
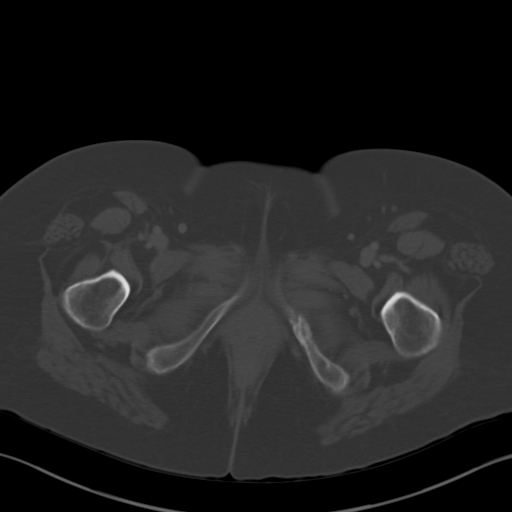
[im 12/84  soft-tissue]
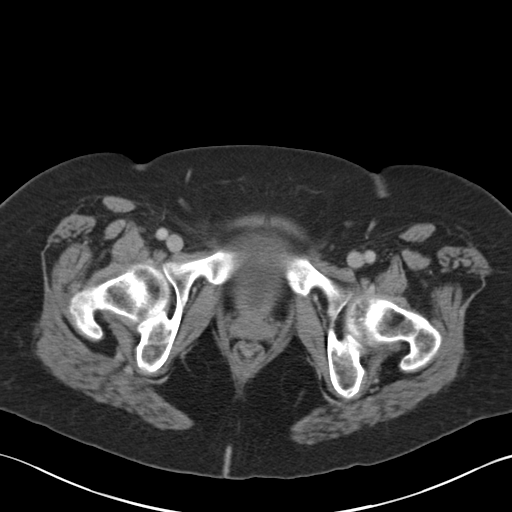
[im 17/84  soft-tissue]
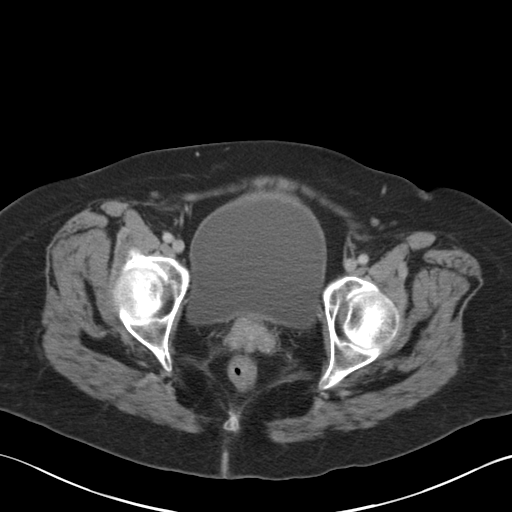
[im 28/84  soft-tissue]
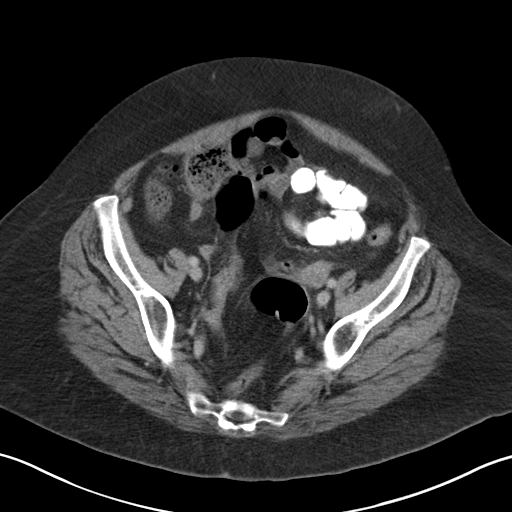
[im 34/84  soft-tissue]
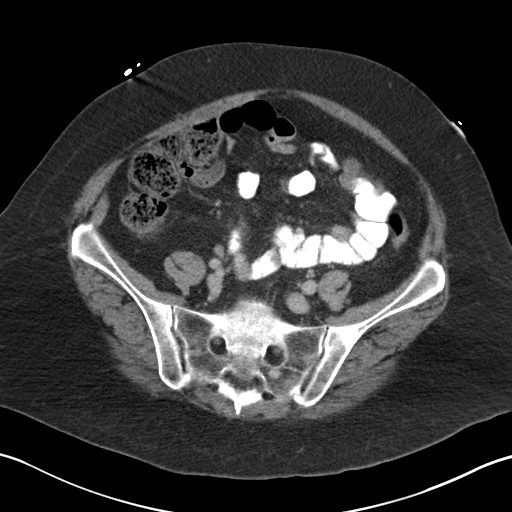
[im 39/84  soft-tissue]
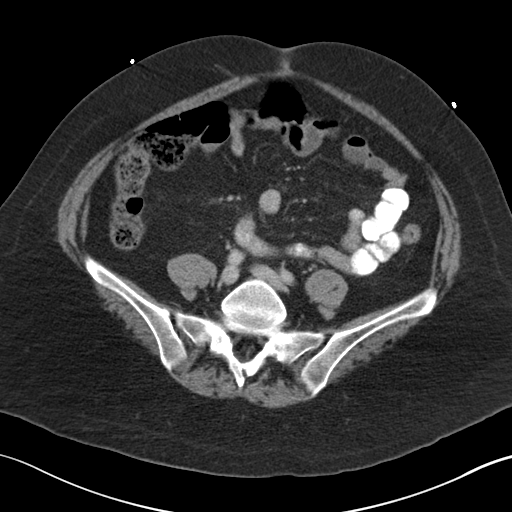
[im 45/84  soft-tissue]
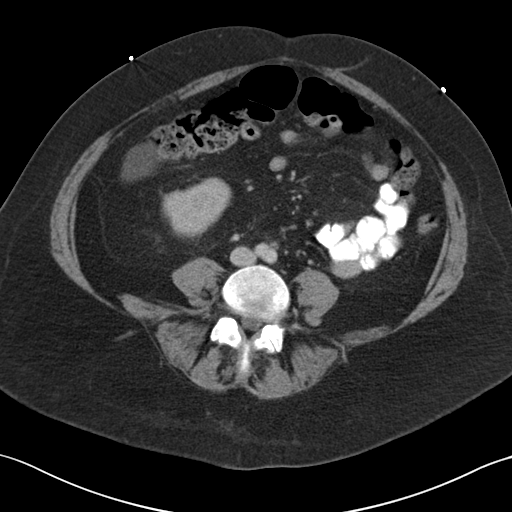
[im 50/84  soft-tissue]
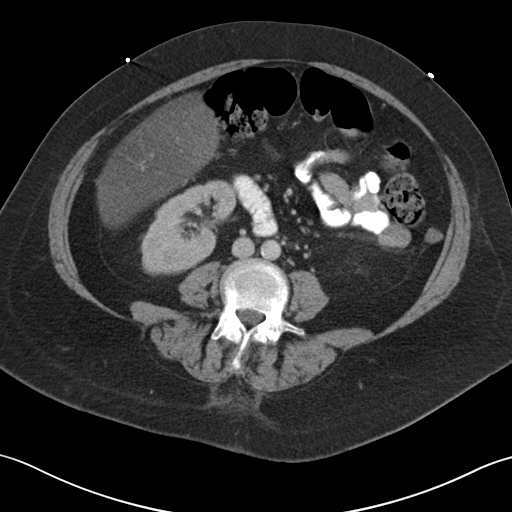
[im 56/84  soft-tissue]
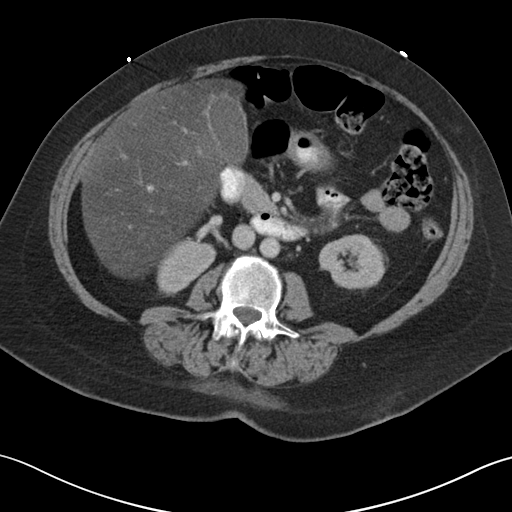
[im 56/84  bone]
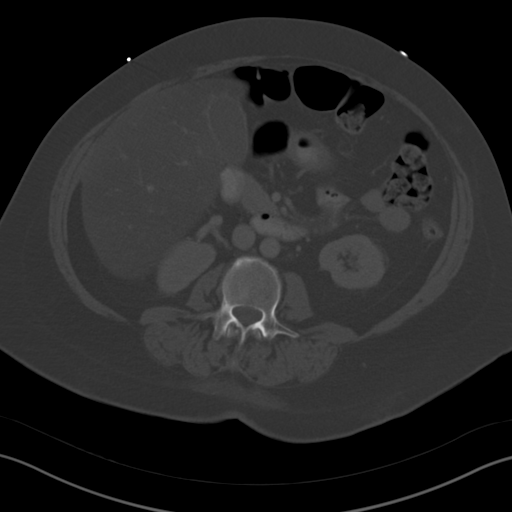
[im 61/84  lung]
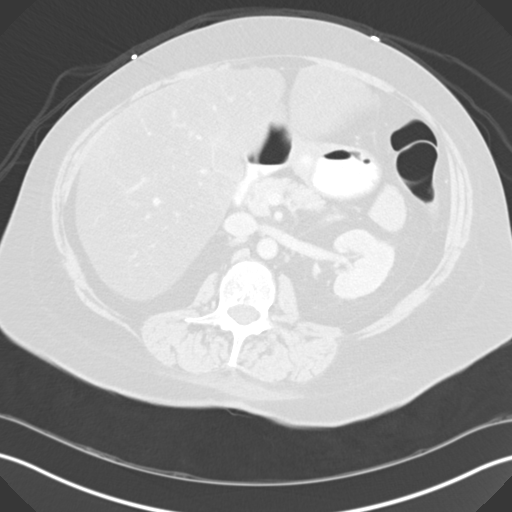
[im 67/84  soft-tissue]
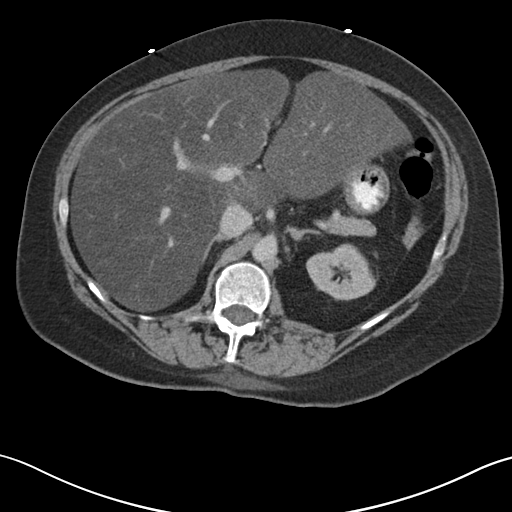
[im 67/84  lung]
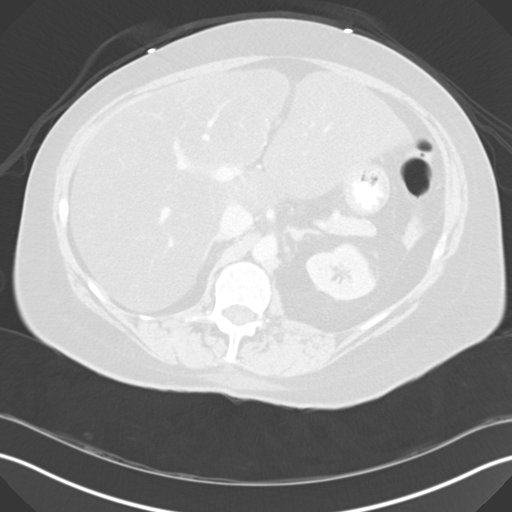
[im 72/84  soft-tissue]
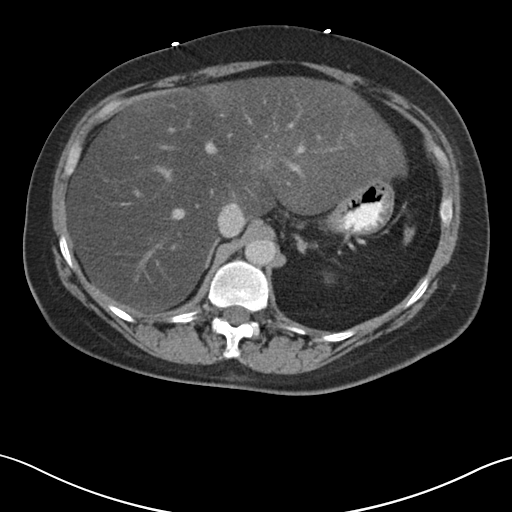
[im 72/84  lung]
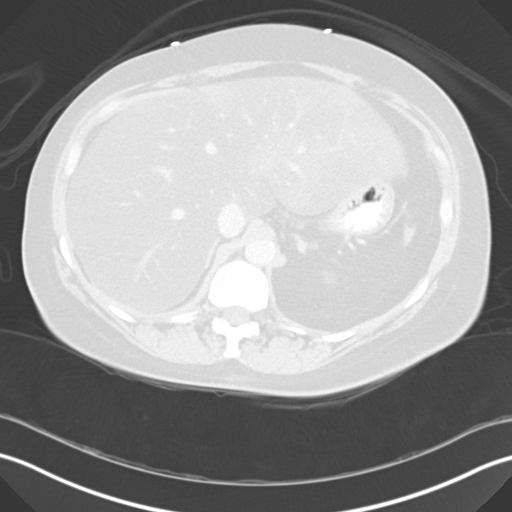
[im 78/84  soft-tissue]
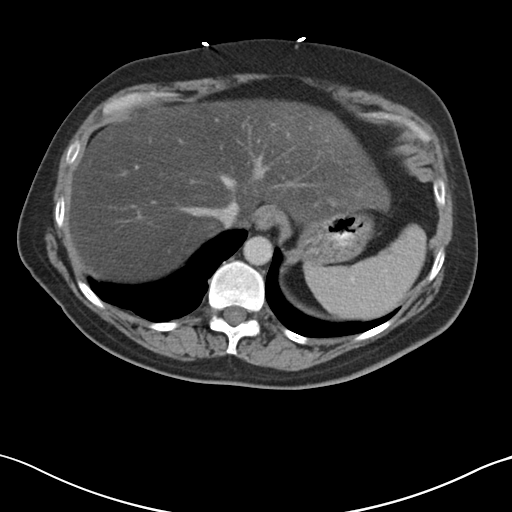
[im 78/84  lung]
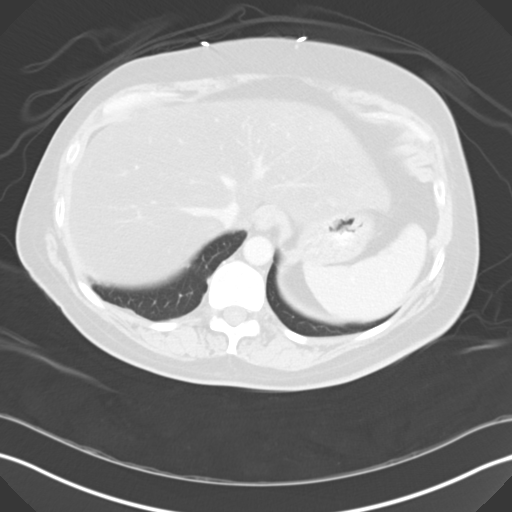

[13 of 32 positions shown; findings below may reference images not displayed]

FINDINGS: Mild linear opacity is within the right greater than left lower
lungs, favor scarring. Normal heart size.

Severe hepatic steatosis.  Lobular hepatic contour.

No appreciable abnormality of the spleen, pancreas, biliary system,
adrenal glands, kidneys. No hydroureteronephrosis.

No overt colitis. Normal appendix. Small bowel loops are of normal
course and caliber. Tiny fat containing umbilical hernia. No free
intraperitoneal air. No lymphadenopathy.

Scattered atherosclerotic disease of the aorta and branch vessels
without aneurysmal dilatation.

Small amount of ill-defined fluid within the pelvis, nonspecific
when the recent postoperative state. Nonspecific appearance to the
left adnexa with 2.1 and 2.7 cm densities (series 2, image 58 and
62). Per the patient's medical record, status post bilateral
oophorectomy. Absent uterus. Thin walled bladder.

Sequelae of left inferior pubic ramus fracture. There is a lytic
lesion associated with this on image 77 series 2. Sclerotic focus
right iliac bone image 53 and left iliac bone image 50. Rounded
sclerotic focus left hemi sacrum series 2, image 51. Sequelae of
prior posterior left eleventh rib fracture.
IMPRESSION: Mild stranding and ill-defined fluid within the pelvis is
nonspecific and often seen in the recent postoperative state. No
large amount of intraperitoneal fluid or contained fluid collection
to explain the patient's drop in hemoglobin.

Nonspecific 2.7 cm and 2.1 cm density left adnexa. Per report, post
oophorectomy. Correlate with follow-up ultrasound.

Osseous lesions within the pelvis raise concern for metastatic
disease. Does the patient have outside imaging to evaluate for
stability? If not, consider bone scan.

## 2015-08-07 ENCOUNTER — Other Ambulatory Visit (HOSPITAL_COMMUNITY): Payer: Self-pay | Admitting: Oncology

## 2015-08-07 MED FILL — LYRICA 75 MG CAPSULE: 75 | 30 days supply | Qty: 120 | Fill #1

## 2015-08-07 MED FILL — VENLAFAXINE HCL ER 75 MG CA: 75 | 30 days supply | Qty: 90 | Fill #0

## 2015-08-07 MED FILL — ACYCLOVIR 400 MG TABLET: 400 | 30 days supply | Qty: 60 | Fill #2

## 2015-08-11 MED FILL — CYCLOBENZAPRINE 10 MG TAB: 10 | 30 days supply | Qty: 90 | Fill #2

## 2015-08-14 ENCOUNTER — Ambulatory Visit (HOSPITAL_COMMUNITY): Payer: Self-pay

## 2015-08-14 ENCOUNTER — Other Ambulatory Visit (HOSPITAL_COMMUNITY): Payer: Self-pay

## 2015-08-18 ENCOUNTER — Encounter (HOSPITAL_COMMUNITY): Payer: Self-pay

## 2015-08-18 ENCOUNTER — Encounter (HOSPITAL_COMMUNITY): Payer: Self-pay | Attending: Oncology

## 2015-08-18 VITALS — BP 153/83 | HR 86 | Resp 20

## 2015-08-18 DIAGNOSIS — C7951 Secondary malignant neoplasm of bone: Secondary | ICD-10-CM | POA: Insufficient documentation

## 2015-08-18 DIAGNOSIS — C50919 Malignant neoplasm of unspecified site of unspecified female breast: Secondary | ICD-10-CM

## 2015-08-18 DIAGNOSIS — C50312 Malignant neoplasm of lower-inner quadrant of left female breast: Secondary | ICD-10-CM | POA: Insufficient documentation

## 2015-08-18 DIAGNOSIS — R978 Other abnormal tumor markers: Secondary | ICD-10-CM | POA: Insufficient documentation

## 2015-08-18 DIAGNOSIS — R938 Abnormal findings on diagnostic imaging of other specified body structures: Secondary | ICD-10-CM | POA: Insufficient documentation

## 2015-08-18 DIAGNOSIS — G893 Neoplasm related pain (acute) (chronic): Secondary | ICD-10-CM

## 2015-08-18 DIAGNOSIS — F411 Generalized anxiety disorder: Secondary | ICD-10-CM | POA: Insufficient documentation

## 2015-08-18 LAB — COMPREHENSIVE METABOLIC PANEL
ALT: 60 U/L — AB (ref 14–54)
AST: 39 U/L (ref 15–41)
Albumin: 4.1 g/dL (ref 3.5–5.0)
Alkaline Phosphatase: 228 U/L — ABNORMAL HIGH (ref 38–126)
Anion gap: 10 (ref 5–15)
BILIRUBIN TOTAL: 0.6 mg/dL (ref 0.3–1.2)
BUN: 35 mg/dL — ABNORMAL HIGH (ref 6–20)
CHLORIDE: 89 mmol/L — AB (ref 101–111)
CO2: 25 mmol/L (ref 22–32)
CREATININE: 0.62 mg/dL (ref 0.44–1.00)
Calcium: 8.9 mg/dL (ref 8.9–10.3)
Glucose, Bld: 127 mg/dL — ABNORMAL HIGH (ref 65–99)
Potassium: 4 mmol/L (ref 3.5–5.1)
Sodium: 124 mmol/L — ABNORMAL LOW (ref 135–145)
TOTAL PROTEIN: 8 g/dL (ref 6.5–8.1)

## 2015-08-18 MED ORDER — DENOSUMAB 120 MG/1.7ML ~~LOC~~ SOLN
120.0000 mg | Freq: Once | SUBCUTANEOUS | Status: AC
  Administered 2015-08-18: 120 mg via SUBCUTANEOUS
  Filled 2015-08-18: qty 1.7

## 2015-08-18 NOTE — Progress Notes (Signed)
Charlieann Clayson presents today for injection per MD orders. X-Geva 120mg  administered SQ in right Upper Arm. Administration without incident. Patient tolerated well.

## 2015-08-18 NOTE — Patient Instructions (Signed)
[place] at [facility] Discharge Instructions  RECOMMENDATIONS MADE BY THE CONSULTANT AND ANY TEST RESULTS WILL BE SENT TO YOUR REFERRING PHYSICIAN.  xgeva injection given today Follow up as scheduled  Thank you for choosing [place] at [facility] to provide your oncology and hematology care.  To afford each patient quality time with our provider, please arrive at least 15 minutes before your scheduled appointment time.   Beginning January 23rd 2017 lab work for the [facility] will be done in the  Main lab at [facility] on 1st floor. If you have a lab appointment with the [facility] please come in thru the  Main Entrance and check in at the main information desk  You need to re-schedule your appointment should you arrive 10 or more minutes late.  We strive to give you quality time with our providers, and arriving late affects you and other patients whose appointments are after yours.  Also, if you no show three or more times for appointments you may be dismissed from the clinic at the providers discretion.     Again, thank you for choosing [facility].  Our hope is that these requests will decrease the amount of time that you wait before being seen by our physicians.       _____________________________________________________________  Should you have questions after your visit to [facility], please contact our office at [phone] between the hours of 8:30 a.m. and 4:30 p.m.  Voicemails left after 4:30 p.m. will not be returned until the following business day.  For prescription refill requests, have your pharmacy contact our office.         Resources For Cancer Patients and their Caregivers ? American Cancer Society: Can assist with transportation, wigs, general needs, runs Look Good Feel Better.        808-761-0925 ? Cancer Care: Provides financial assistance, online support groups,  medication/co-pay assistance.  1-800-813-HOPE 581-583-0822) ? [place] Assists [place] cancer patients and their families through emotional , educational and financial support.  2237837193 ? [place] DSS Where to apply for food stamps, Medicaid and utility assistance. 343-290-9405 ? [facility]: Transportation to medical appointments. 651-005-7330 ? Social Security Administration: May apply for disability if have a Stage IV cancer. (703)785-2237 940 741 4330 ? [employer], Disability and Transit Services: Assists with nutrition, care and transit needs. [place] Support Programs: @10RELATIVEDAYS @ > Cancer Support Group  2nd Tuesday of the month 1pm-2pm, Journey Room  > Creative Journey  3rd Tuesday of the month 1130am-1pm, Journey Room  > Look Good Feel Better  1st Wednesday of the month 10am-12 noon, Journey Room (Call [place] to register [phone])

## 2015-08-21 MED FILL — traZODone HCL 50 MG TABS: 50 | 30 days supply | Qty: 30 | Fill #3

## 2015-08-21 MED FILL — TEMAZEPAM 15 MG CAPSULE: 15 | 30 days supply | Qty: 60 | Fill #1

## 2015-08-21 MED FILL — LISINOPRIL 40 MG TABLET: 40 | 30 days supply | Qty: 30 | Fill #2

## 2015-08-23 MED FILL — ALPRAZolam 1 MG TABS: 1 | 30 days supply | Qty: 90 | Fill #3

## 2015-08-25 ENCOUNTER — Other Ambulatory Visit (HOSPITAL_COMMUNITY): Payer: Self-pay | Admitting: Oncology

## 2015-08-25 DIAGNOSIS — C7951 Secondary malignant neoplasm of bone: Principal | ICD-10-CM

## 2015-08-25 DIAGNOSIS — G893 Neoplasm related pain (acute) (chronic): Secondary | ICD-10-CM

## 2015-08-25 DIAGNOSIS — C50919 Malignant neoplasm of unspecified site of unspecified female breast: Secondary | ICD-10-CM

## 2015-08-25 MED ORDER — OXYCODONE HCL 10 MG PO TABS: ORAL_TABLET | ORAL | Status: DC

## 2015-08-25 MED ORDER — MORPHINE SULFATE ER 60 MG PO TBCR: EXTENDED_RELEASE_TABLET | ORAL | Status: DC

## 2015-08-28 MED FILL — oxyCODONE HCL 10 MG TABS: 10 | 12 days supply | Qty: 150 | Fill #0

## 2015-08-28 MED FILL — MORPHINE SULF 60 MG TAB SA: 60 | 30 days supply | Qty: 180 | Fill #0

## 2015-09-04 ENCOUNTER — Other Ambulatory Visit (HOSPITAL_COMMUNITY): Payer: Self-pay | Admitting: Oncology

## 2015-09-04 MED FILL — ACYCLOVIR 400 MG TABLET: 400 | 30 days supply | Qty: 60 | Fill #3

## 2015-09-05 MED FILL — CYCLOBENZAPRINE 10 MG TAB: 10 | 30 days supply | Qty: 90 | Fill #0

## 2015-09-07 MED FILL — LETROZOLE 2.5 MG TABLET: 2.5 | 90 days supply | Qty: 90 | Fill #3

## 2015-09-07 MED FILL — LYRICA 75 MG CAPSULE: 75 | 30 days supply | Qty: 120 | Fill #2

## 2015-09-08 ENCOUNTER — Ambulatory Visit (HOSPITAL_COMMUNITY): Payer: Self-pay

## 2015-09-08 ENCOUNTER — Ambulatory Visit (HOSPITAL_COMMUNITY): Payer: Self-pay | Admitting: Hematology & Oncology

## 2015-09-08 ENCOUNTER — Ambulatory Visit (HOSPITAL_COMMUNITY): Payer: Self-pay | Admitting: Oncology

## 2015-09-08 ENCOUNTER — Other Ambulatory Visit (HOSPITAL_COMMUNITY): Payer: Self-pay

## 2015-09-15 ENCOUNTER — Other Ambulatory Visit (HOSPITAL_COMMUNITY): Payer: Self-pay

## 2015-09-15 ENCOUNTER — Ambulatory Visit (HOSPITAL_COMMUNITY): Payer: Self-pay

## 2015-09-20 ENCOUNTER — Telehealth (HOSPITAL_COMMUNITY): Payer: Self-pay | Admitting: *Deleted

## 2015-09-21 ENCOUNTER — Other Ambulatory Visit (HOSPITAL_COMMUNITY): Payer: Self-pay | Admitting: Oncology

## 2015-09-21 DIAGNOSIS — C7951 Secondary malignant neoplasm of bone: Principal | ICD-10-CM

## 2015-09-21 DIAGNOSIS — C50919 Malignant neoplasm of unspecified site of unspecified female breast: Secondary | ICD-10-CM

## 2015-09-21 MED ORDER — OXYCODONE HCL 10 MG PO TABS: ORAL_TABLET | ORAL | 0 refills | Status: DC

## 2015-09-22 MED FILL — oxyCODONE HCL 10 MG TABS: 10 | 12 days supply | Qty: 150 | Fill #0

## 2015-09-22 MED FILL — ALPRAZolam 1 MG TABS: 1 | 30 days supply | Qty: 90 | Fill #4

## 2015-09-22 MED FILL — traZODone HCL 50 MG TABS: 50 | 30 days supply | Qty: 30 | Fill #4

## 2015-09-22 MED FILL — LISINOPRIL 40 MG TABLET: 40 | 30 days supply | Qty: 30 | Fill #3

## 2015-09-26 ENCOUNTER — Other Ambulatory Visit (HOSPITAL_COMMUNITY): Payer: Self-pay | Admitting: Oncology

## 2015-09-26 DIAGNOSIS — G893 Neoplasm related pain (acute) (chronic): Secondary | ICD-10-CM

## 2015-09-26 DIAGNOSIS — C50919 Malignant neoplasm of unspecified site of unspecified female breast: Secondary | ICD-10-CM

## 2015-09-26 DIAGNOSIS — C7951 Secondary malignant neoplasm of bone: Principal | ICD-10-CM

## 2015-09-26 MED ORDER — MORPHINE SULFATE ER 60 MG PO TBCR: EXTENDED_RELEASE_TABLET | ORAL | 0 refills | Status: DC

## 2015-09-27 MED FILL — MORPHINE SULF 60 MG TAB SA: 60 | 30 days supply | Qty: 180 | Fill #0

## 2015-09-28 ENCOUNTER — Encounter (HOSPITAL_COMMUNITY): Payer: Self-pay

## 2015-09-28 ENCOUNTER — Encounter (HOSPITAL_COMMUNITY): Payer: Self-pay | Attending: Oncology

## 2015-09-28 VITALS — BP 82/53 | HR 98 | Temp 98.9°F | Resp 20

## 2015-09-28 DIAGNOSIS — G893 Neoplasm related pain (acute) (chronic): Secondary | ICD-10-CM

## 2015-09-28 DIAGNOSIS — C50919 Malignant neoplasm of unspecified site of unspecified female breast: Secondary | ICD-10-CM

## 2015-09-28 DIAGNOSIS — C7951 Secondary malignant neoplasm of bone: Principal | ICD-10-CM

## 2015-09-28 DIAGNOSIS — R978 Other abnormal tumor markers: Secondary | ICD-10-CM | POA: Insufficient documentation

## 2015-09-28 DIAGNOSIS — F411 Generalized anxiety disorder: Secondary | ICD-10-CM | POA: Insufficient documentation

## 2015-09-28 DIAGNOSIS — R938 Abnormal findings on diagnostic imaging of other specified body structures: Secondary | ICD-10-CM | POA: Insufficient documentation

## 2015-09-28 DIAGNOSIS — C50312 Malignant neoplasm of lower-inner quadrant of left female breast: Secondary | ICD-10-CM | POA: Insufficient documentation

## 2015-09-28 LAB — COMPREHENSIVE METABOLIC PANEL
ALBUMIN: 3.5 g/dL (ref 3.5–5.0)
ALK PHOS: 253 U/L — AB (ref 38–126)
ALT: 58 U/L — ABNORMAL HIGH (ref 14–54)
ANION GAP: 8 (ref 5–15)
AST: 40 U/L (ref 15–41)
BUN: 33 mg/dL — ABNORMAL HIGH (ref 6–20)
CHLORIDE: 91 mmol/L — AB (ref 101–111)
CO2: 26 mmol/L (ref 22–32)
Calcium: 9.2 mg/dL (ref 8.9–10.3)
Creatinine, Ser: 1.05 mg/dL — ABNORMAL HIGH (ref 0.44–1.00)
GFR calc Af Amer: >60 mL/min (ref >60)
GFR calc non Af Amer: 59 mL/min — ABNORMAL LOW (ref >60)
GLUCOSE: 101 mg/dL — AB (ref 65–99)
POTASSIUM: 5 mmol/L (ref 3.5–5.1)
SODIUM: 125 mmol/L — AB (ref 135–145)
Total Bilirubin: 0.6 mg/dL (ref 0.3–1.2)
Total Protein: 7.8 g/dL (ref 6.5–8.1)

## 2015-09-28 MED ORDER — DENOSUMAB 120 MG/1.7ML ~~LOC~~ SOLN
120.0000 mg | Freq: Once | SUBCUTANEOUS | Status: AC
  Administered 2015-09-28: 120 mg via SUBCUTANEOUS
  Filled 2015-09-28: qty 1.7

## 2015-09-28 NOTE — Patient Instructions (Addendum)
Discussed with the patient and all questioned fully answered. She will call me if any problems arise. Received Xgeva today. Follow-up as scheduled

## 2015-09-28 NOTE — Progress Notes (Signed)
Brittany Blair tolerated Xgeva well without any complaints. Pt denied any jaw or tooth pain prior to administration. B/P 82/53  And 85/51. Dr. Whitney Muse notified and pt instructed to decrease her Lisinopril by half daily. Pt and husband verbalized understanding. Pt discharged via wheelchair in satisfactory condition

## 2015-10-02 MED FILL — VENLAFAXINE HCL ER 75 MG CA: 75 | 30 days supply | Qty: 90 | Fill #1

## 2015-10-02 MED FILL — CYCLOBENZAPRINE 10 MG TAB: 10 | 30 days supply | Qty: 90 | Fill #1

## 2015-10-05 MED FILL — ACYCLOVIR 400 MG TABLET: 400 | 30 days supply | Qty: 60 | Fill #4

## 2015-10-05 MED FILL — LYRICA 75 MG CAPSULE: 75 | 30 days supply | Qty: 120 | Fill #3

## 2015-10-16 ENCOUNTER — Emergency Department (HOSPITAL_COMMUNITY): Payer: Self-pay

## 2015-10-16 ENCOUNTER — Inpatient Hospital Stay (HOSPITAL_COMMUNITY)
Admission: EM | Admit: 2015-10-16 | Discharge: 2015-10-24 | DRG: 689 | Disposition: A | Discharge status: Home / Self Care | Payer: Self-pay | Attending: Internal Medicine | Admitting: Internal Medicine

## 2015-10-16 ENCOUNTER — Encounter (HOSPITAL_COMMUNITY): Payer: Self-pay | Admitting: Emergency Medicine

## 2015-10-16 ENCOUNTER — Other Ambulatory Visit: Payer: Self-pay

## 2015-10-16 DIAGNOSIS — I10 Essential (primary) hypertension: Secondary | ICD-10-CM | POA: Diagnosis present

## 2015-10-16 DIAGNOSIS — G8929 Other chronic pain: Secondary | ICD-10-CM | POA: Diagnosis present

## 2015-10-16 DIAGNOSIS — T426X5A Adverse effect of other antiepileptic and sedative-hypnotic drugs, initial encounter: Secondary | ICD-10-CM | POA: Diagnosis present

## 2015-10-16 DIAGNOSIS — E861 Hypovolemia: Secondary | ICD-10-CM | POA: Diagnosis present

## 2015-10-16 DIAGNOSIS — D638 Anemia in other chronic diseases classified elsewhere: Secondary | ICD-10-CM | POA: Diagnosis present

## 2015-10-16 DIAGNOSIS — E669 Obesity, unspecified: Secondary | ICD-10-CM | POA: Diagnosis present

## 2015-10-16 DIAGNOSIS — Z79899 Other long term (current) drug therapy: Secondary | ICD-10-CM

## 2015-10-16 DIAGNOSIS — E871 Hypo-osmolality and hyponatremia: Secondary | ICD-10-CM | POA: Diagnosis present

## 2015-10-16 DIAGNOSIS — C7951 Secondary malignant neoplasm of bone: Secondary | ICD-10-CM | POA: Diagnosis present

## 2015-10-16 DIAGNOSIS — Z9012 Acquired absence of left breast and nipple: Secondary | ICD-10-CM

## 2015-10-16 DIAGNOSIS — Z853 Personal history of malignant neoplasm of breast: Secondary | ICD-10-CM

## 2015-10-16 DIAGNOSIS — T402X5A Adverse effect of other opioids, initial encounter: Secondary | ICD-10-CM | POA: Diagnosis present

## 2015-10-16 DIAGNOSIS — Z8249 Family history of ischemic heart disease and other diseases of the circulatory system: Secondary | ICD-10-CM

## 2015-10-16 DIAGNOSIS — G92 Toxic encephalopathy: Secondary | ICD-10-CM | POA: Diagnosis present

## 2015-10-16 DIAGNOSIS — F329 Major depressive disorder, single episode, unspecified: Secondary | ICD-10-CM | POA: Diagnosis present

## 2015-10-16 DIAGNOSIS — Z79891 Long term (current) use of opiate analgesic: Secondary | ICD-10-CM

## 2015-10-16 DIAGNOSIS — R32 Unspecified urinary incontinence: Secondary | ICD-10-CM | POA: Diagnosis present

## 2015-10-16 DIAGNOSIS — T43215A Adverse effect of selective serotonin and norepinephrine reuptake inhibitors, initial encounter: Secondary | ICD-10-CM | POA: Diagnosis present

## 2015-10-16 DIAGNOSIS — N390 Urinary tract infection, site not specified: Principal | ICD-10-CM | POA: Diagnosis present

## 2015-10-16 DIAGNOSIS — G893 Neoplasm related pain (acute) (chronic): Secondary | ICD-10-CM | POA: Diagnosis present

## 2015-10-16 DIAGNOSIS — E876 Hypokalemia: Secondary | ICD-10-CM | POA: Diagnosis present

## 2015-10-16 DIAGNOSIS — F17210 Nicotine dependence, cigarettes, uncomplicated: Secondary | ICD-10-CM | POA: Diagnosis present

## 2015-10-16 DIAGNOSIS — C50912 Malignant neoplasm of unspecified site of left female breast: Secondary | ICD-10-CM | POA: Diagnosis present

## 2015-10-16 DIAGNOSIS — Z809 Family history of malignant neoplasm, unspecified: Secondary | ICD-10-CM

## 2015-10-16 DIAGNOSIS — F32A Depression, unspecified: Secondary | ICD-10-CM | POA: Diagnosis present

## 2015-10-16 DIAGNOSIS — Z79811 Long term (current) use of aromatase inhibitors: Secondary | ICD-10-CM

## 2015-10-16 DIAGNOSIS — G9340 Encephalopathy, unspecified: Secondary | ICD-10-CM | POA: Diagnosis present

## 2015-10-16 DIAGNOSIS — R4182 Altered mental status, unspecified: Secondary | ICD-10-CM | POA: Diagnosis present

## 2015-10-16 LAB — BASIC METABOLIC PANEL
Anion gap: 14 (ref 5–15)
BUN: 26 mg/dL — ABNORMAL HIGH (ref 6–20)
CO2: 25 mmol/L (ref 22–32)
Calcium: 9.6 mg/dL (ref 8.9–10.3)
Chloride: 93 mmol/L — ABNORMAL LOW (ref 101–111)
Creatinine, Ser: 1.04 mg/dL — ABNORMAL HIGH (ref 0.44–1.00)
GFR calc Af Amer: >60 mL/min (ref >60)
GFR calc non Af Amer: 59 mL/min — ABNORMAL LOW (ref >60)
Glucose, Bld: 101 mg/dL — ABNORMAL HIGH (ref 65–99)
Potassium: 4.3 mmol/L (ref 3.5–5.1)
Sodium: 132 mmol/L — ABNORMAL LOW (ref 135–145)

## 2015-10-16 LAB — LACTIC ACID, PLASMA
Lactic Acid, Venous: 1.2 mmol/L (ref 0.5–1.9)
Lactic Acid, Venous: 0.8 mmol/L (ref 0.5–1.9)

## 2015-10-16 LAB — URINE MICROSCOPIC-ADD ON

## 2015-10-16 LAB — AMMONIA: Ammonia: 23 umol/L (ref 9–35)

## 2015-10-16 LAB — URINALYSIS, ROUTINE W REFLEX MICROSCOPIC
Bilirubin Urine: NEGATIVE
Glucose, UA: NEGATIVE mg/dL
Ketones, ur: 15 mg/dL — AB
Nitrite: NEGATIVE
Protein, ur: 100 mg/dL — AB
Specific Gravity, Urine: 1.02 (ref 1.005–1.030)
pH: 5.5 (ref 5.0–8.0)

## 2015-10-16 LAB — CBC WITH DIFFERENTIAL/PLATELET
Basophils Absolute: 0.1 10*3/uL (ref 0.0–0.1)
Basophils Relative: 1 %
Eosinophils Absolute: 0.1 10*3/uL (ref 0.0–0.7)
Eosinophils Relative: 1 %
HCT: 29.1 % — ABNORMAL LOW (ref 36.0–46.0)
Hemoglobin: 9.4 g/dL — ABNORMAL LOW (ref 12.0–15.0)
Lymphocytes Relative: 13 %
Lymphs Abs: 1.2 10*3/uL (ref 0.7–4.0)
MCH: 29.6 pg (ref 26.0–34.0)
MCHC: 32.3 g/dL (ref 30.0–36.0)
MCV: 91.5 fL (ref 78.0–100.0)
Monocytes Absolute: 1.4 10*3/uL — ABNORMAL HIGH (ref 0.1–1.0)
Monocytes Relative: 15 %
Neutro Abs: 6.3 10*3/uL (ref 1.7–7.7)
Neutrophils Relative %: 70 %
Platelets: 396 10*3/uL (ref 150–400)
RBC: 3.18 MIL/uL — ABNORMAL LOW (ref 3.87–5.11)
RDW: 14.5 % (ref 11.5–15.5)
WBC: 9.1 10*3/uL (ref 4.0–10.5)

## 2015-10-16 LAB — TSH: TSH: 2.433 u[IU]/mL (ref 0.350–4.500)

## 2015-10-16 MED ORDER — LETROZOLE 2.5 MG PO TABS
2.5000 mg | ORAL_TABLET | Freq: Every day | ORAL | Status: DC
  Administered 2015-10-17 – 2015-10-24 (×8): 2.5 mg via ORAL
  Filled 2015-10-16 (×9): qty 1

## 2015-10-16 MED ORDER — SODIUM CHLORIDE 0.9 % IV BOLUS (SEPSIS)
1000.0000 mL | Freq: Once | INTRAVENOUS | Status: AC
  Administered 2015-10-16: 1000 mL via INTRAVENOUS

## 2015-10-16 MED ORDER — VENLAFAXINE HCL ER 75 MG PO CP24
225.0000 mg | ORAL_CAPSULE | Freq: Every day | ORAL | Status: DC
  Administered 2015-10-17 – 2015-10-24 (×8): 225 mg via ORAL
  Filled 2015-10-16 (×8): qty 3

## 2015-10-16 MED ORDER — CEFTRIAXONE SODIUM 1 G IJ SOLR
1.0000 g | INTRAMUSCULAR | Status: DC
  Administered 2015-10-17 – 2015-10-21 (×5): 1 g via INTRAVENOUS
  Filled 2015-10-16 (×6): qty 10

## 2015-10-16 MED ORDER — NAPHAZOLINE-GLYCERIN 0.012-0.2 % OP SOLN
1.0000 [drp] | Freq: Four times a day (QID) | OPHTHALMIC | Status: DC | PRN
  Filled 2015-10-16: qty 15

## 2015-10-16 MED ORDER — PREGABALIN 75 MG PO CAPS
150.0000 mg | ORAL_CAPSULE | Freq: Two times a day (BID) | ORAL | Status: DC
  Administered 2015-10-17: 150 mg via ORAL
  Filled 2015-10-16: qty 2

## 2015-10-16 MED ORDER — LISINOPRIL 10 MG PO TABS: 40.0000 mg | ORAL_TABLET | Freq: Every day | ORAL | Status: DC

## 2015-10-16 MED ORDER — DEXTROSE 5 % IV SOLN
1.0000 g | Freq: Once | INTRAVENOUS | Status: AC
  Administered 2015-10-16: 1 g via INTRAVENOUS
  Filled 2015-10-16: qty 10

## 2015-10-16 MED ORDER — TEMAZEPAM 15 MG PO CAPS
15.0000 mg | ORAL_CAPSULE | Freq: Every evening | ORAL | Status: DC | PRN
  Administered 2015-10-18 – 2015-10-23 (×4): 15 mg via ORAL
  Filled 2015-10-16 (×4): qty 1

## 2015-10-16 MED ORDER — AMLODIPINE BESYLATE 5 MG PO TABS: 5.0000 mg | ORAL_TABLET | Freq: Two times a day (BID) | ORAL | Status: DC

## 2015-10-16 MED ORDER — METOPROLOL SUCCINATE ER 25 MG PO TB24
25.0000 mg | ORAL_TABLET | Freq: Every day | ORAL | Status: DC
  Administered 2015-10-17 – 2015-10-24 (×8): 25 mg via ORAL
  Filled 2015-10-16 (×8): qty 1

## 2015-10-16 MED ORDER — ACYCLOVIR 400 MG PO TABS
400.0000 mg | ORAL_TABLET | Freq: Two times a day (BID) | ORAL | Status: DC
  Filled 2015-10-16 (×4): qty 1

## 2015-10-16 MED ORDER — ACETAMINOPHEN 325 MG PO TABS
650.0000 mg | ORAL_TABLET | Freq: Once | ORAL | Status: AC
  Administered 2015-10-16: 650 mg via ORAL
  Filled 2015-10-16: qty 2

## 2015-10-16 MED ORDER — HEPARIN SODIUM (PORCINE) 5000 UNIT/ML IJ SOLN
5000.0000 [IU] | Freq: Three times a day (TID) | INTRAMUSCULAR | Status: DC
  Administered 2015-10-16 – 2015-10-24 (×24): 5000 [IU] via SUBCUTANEOUS
  Filled 2015-10-16 (×24): qty 1

## 2015-10-16 MED ORDER — DEXTROSE-NACL 5-0.9 % IV SOLN
INTRAVENOUS | Status: DC
  Administered 2015-10-16 – 2015-10-17 (×2): via INTRAVENOUS

## 2015-10-16 MED ORDER — CLOTRIMAZOLE 1 % EX CREA
1.0000 "application " | TOPICAL_CREAM | Freq: Two times a day (BID) | CUTANEOUS | Status: DC | PRN
  Filled 2015-10-16: qty 15

## 2015-10-16 NOTE — ED Notes (Signed)
Pt transported to CT ?

## 2015-10-16 NOTE — ED Triage Notes (Signed)
Pt brought in by EMS. Pt has cancer which has spread to bones. Pt c/o R side hip pain. Per EMS husband reported pt has not been eating or drinking for the last 3-4 days. Pt confused.

## 2015-10-16 NOTE — H&P (Signed)
History and Physical    Brittany Blair WUJ:[id number] DOB: [birth year] DOA: 10/16/2015  Referring MD/NP/PA: Virgel Manifold, MD PCP: No PCP Per Patient  Outpatient Specialists:   General Surgery: Neldon Mc, MD  Obstetrics and Gynecology: Ena Dawley, MD  Gastroenterology: Wonda Horner, MD  Patient coming from: Home  Chief Complaint: confusion.  HPI: Brittany Blair is a 55 y.o. 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 Her entire history is provided by her husband. He states that she can usually walk with some assistance but her walking has been in rapid decline over the last few days.  She also was noted to have trouble with her thought process.  There has been no new medication, HA, fever, chills, or any ill contact.    ED Course: While in ED, Sodium 132, BUN 26, Cr 1.04, Hgb, 9.4, Glucose 101, UA is indicative of UTI with TNTC WBCs. EKG showed sinus tachycardia. CT head shows no evidence of acute intracranial abnormality. CT head also shows mild atrophy with small vessel ischemic changes. She will be admitted for treatment of her UTI.  Review of Systems: As per HPI otherwise 10 point review of systems negative.    Past Medical History:  Diagnosis Date  . Anxiety   . B12 deficiency 05/12/2015  . Breast cancer ([place]) dx'd 05/27/2010   chemo/xrt comp 01/2011  . Breast lump    left  . Carcinoma of breast metastatic to bone ([place]) 05/28/2010   This patient presented in April with an invasive ductal carcinoma that had broken through the skin and was contaminated with MRSA. She was stage III. It was lower inner quadrant. It was receptor positive, HER-2/neu negative, and had a KI-67 of 75%.  She was treated with neoadjuvant chemotherapy. She was  recently hospitalized (June, 2012,) with multiple superficial MRSA infections. These have all r  . Depression   . Genital herpes 08/04/2012  . Hematoma   . Herpes   . Hypertension   . Lymph edema   . MRSA (methicillin resistant Staphylococcus aureus)   . Multiple blisters    along surgical site  . PMB (postmenopausal bleeding) 06/23/2013  . Swelling    left foot  . Vaginal delivery 1981, 1984, 1987, 1992    Past Surgical History:  Procedure Laterality Date  . BONE BIOPSY Left 11/2013   left post. hip  . CYSTOSCOPY N/A 10/14/2013   Procedure: CYSTOSCOPY;  Surgeon: Ena Dawley, MD;  Location: [facility];  Service: Gynecology;  Laterality: N/A;  . ESOPHAGOGASTRODUODENOSCOPY N/A 10/27/2013   Procedure: ESOPHAGOGASTRODUODENOSCOPY (EGD);  Surgeon: Wonda Horner, MD;  Location: [facility];  Service: Endoscopy;  Laterality: N/A;  . LAPAROSCOPIC ASSISTED VAGINAL HYSTERECTOMY Bilateral 10/14/2013   Procedure: LAPAROSCOPIC ASSISTED VAGINAL HYSTERECTOMY;  Surgeon: Ena Dawley, MD;  Location: [facility];  Service: Gynecology;  Laterality: Bilateral;  . LAPAROSCOPIC BILATERAL SALPINGO OOPHERECTOMY Bilateral 10/14/2013   Procedure: LAPAROSCOPIC BILATERAL SALPINGO OOPHORECTOMY;  Surgeon: Ena Dawley, MD;  Location: [facility];  Service: Gynecology;  Laterality: Bilateral;  . LAPAROSCOPIC LYSIS OF ADHESIONS N/A 10/14/2013   Procedure: LAPAROSCOPIC LYSIS OF ADHESIONS;  Surgeon: Ena Dawley, MD;  Location: [facility];  Service: Gynecology;  Laterality: N/A;  . MASTECTOMY MODIFIED RADICAL  10/10/10    left -Dr Margot Chimes  . TUBAL LIGATION    .  WISDOM TOOTH EXTRACTION       reports that she has been smoking Cigarettes.  She has been smoking about 0.35 packs per day. She has never used smokeless tobacco. She reports that she drinks about 12.6 oz of alcohol per week . She reports that she does not use drugs.  Allergies  Allergen Reactions  . Bee Venom Anaphylaxis    Family History  Problem Relation Age of  Onset  . Heart disease Father     heart attack  . Hypertension Daughter   . Hypertension Son   . Cancer Maternal Grandmother     ovarian  . Hypertension Maternal Grandfather   . Cancer Paternal Grandmother     ovarian  . Hypertension Paternal Grandfather     Prior to Admission medications   Medication Sig Start Date End Date Taking? Authorizing Provider  acyclovir (ZOVIRAX) 400 MG tablet TAKE 1 TABLET BY MOUTH TWICE DAILY 06/05/15   Baird Cancer, PA-C  ALPRAZolam Duanne Moron) 1 MG tablet TAKE 1 TABLET BY MOUTH 3 TIMES DAILY AS NEEDED FOR ANXIETY 05/12/15   Baird Cancer, PA-C  amLODipine (NORVASC) 5 MG tablet TAKE 1 TABLET BY MOUTH TWICE DAILY 12/19/14   Baird Cancer, PA-C  bacitracin-polymyxin b (POLYSPORIN) ophthalmic ointment Place 1 application into both eyes 3 (three) times daily. apply 1 inch to each eye 3 times a day 04/07/15   Patrici Ranks, MD  calcium-vitamin D (OSCAL WITH D) 500-200 MG-UNIT per tablet Take 2 tablets by mouth daily with breakfast. 01/14/14   Baird Cancer, PA-C  clotrimazole (LOTRIMIN) 1 % cream Apply 1 application topically 2 (two) times daily as needed (yeast rash).    Historical Provider, MD  cyclobenzaprine (FLEXERIL) 10 MG tablet TAKE 1 TABLET BY MOUTH 3 TIMES DAILY AS NEEDED FOR MUSCLE SPASMS 09/05/15   Baird Cancer, PA-C  letrozole [facility]) 2.5 MG tablet Take 1 tablet (2.5 mg total) by mouth daily. 12/15/14   Baird Cancer, PA-C  lisinopril (PRINIVIL,ZESTRIL) 40 MG tablet TAKE 1 TABLET BY MOUTH ONCE DAILY 06/27/15   Baird Cancer, PA-C  loratadine (CLARITIN) 10 MG tablet Take 10 mg by mouth daily as needed for allergies.     Historical Provider, MD  metoprolol succinate (TOPROL-XL) 25 MG 24 hr tablet TAKE 3 TABLETS BY MOUTH ONCE DAILY WITH OR IMMEDIATELY FOLLOWING A MEAL 05/09/15   Baird Cancer, PA-C  morphine (MS CONTIN) 60 MG 12 hr tablet Take 2 tablet in AM, take 2 tablet in afternoon, take 2 tablets in evening 09/26/15   Manon Hilding  [place], PA-C  OVER THE COUNTER MEDICATION Take 1 oz by mouth 2 (two) times daily. Liquid calcium with vitamin D    Historical Provider, MD  Oxycodone HCl 10 MG TABS Take 1 or 2 tablets every 4 hours to control pain. 09/21/15   Baird Cancer, PA-C  palbociclib (IBRANCE) 100 MG capsule Take 1 capsule daily with food for 21 days, rest 7 days, then resume taking the medicine 3 weeks on, 1 week off. 05/23/15   Baird Cancer, PA-C  polyethylene glycol (MIRALAX / GLYCOLAX) packet Take 8.5 g by mouth daily as needed for moderate constipation.    Historical Provider, MD  pregabalin (LYRICA) 75 MG capsule Take two capsules in the morning and and 2 capsules at night 06/30/15   Patrici Ranks, MD  promethazine (PHENERGAN) 25 MG tablet Take 1 tablet (25 mg total) by mouth every 6 (six) hours as needed for  nausea or vomiting. 06/16/15   Baird Cancer, PA-C  temazepam (RESTORIL) 15 MG capsule TAKE 1 TO 2 CAPSULES BY MOUTH AT BEDTIME AS NEEDED FOR SLEEP 07/27/15   Baird Cancer, PA-C  tetrahydrozoline-zinc (VISINE-AC) 0.05-0.25 % ophthalmic solution Place 2 drops into both eyes 3 (three) times daily as needed (for allergies).     Historical Provider, MD  traZODone (DESYREL) 50 MG tablet TAKE 1 TABLET BY MOUTH AT BEDTIME. 05/18/15   Patrici Ranks, MD  venlafaxine XR (EFFEXOR-XR) 75 MG 24 hr capsule TAKE 3 CAPSULES BY MOUTH DAILY 08/07/15   Baird Cancer, PA-C    Physical Exam: Vitals:   10/16/15 1730 10/16/15 1830 10/16/15 1850 10/16/15 1900  BP: 137/67 128/84  142/91  Pulse: 118     Resp: 16 13  13   Temp:   100.3 F (37.9 C)   TempSrc:   Rectal   SpO2: 98%     Weight:      Height:          Constitutional: NAD, calm, comfortable Vitals:   10/16/15 1730 10/16/15 1830 10/16/15 1850 10/16/15 1900  BP: 137/67 128/84  142/91  Pulse: 118     Resp: 16 13  13   Temp:   100.3 F (37.9 C)   TempSrc:   Rectal   SpO2: 98%     Weight:      Height:       Eyes: PERRL, lids and conjunctivae  normal ENMT: Mucous membranes are moist. Posterior pharynx clear of any exudate or lesions.Normal dentition.  Neck: normal, supple, no masses, no thyromegaly Respiratory: clear to auscultation bilaterally, no wheezing, no crackles. Normal respiratory effort. No accessory muscle use.  Cardiovascular: Regular rate and rhythm, no murmurs / rubs / gallops. No extremity edema. 2+ pedal pulses. No carotid bruits.  Abdomen: no tenderness, no masses palpated. No hepatosplenomegaly. Bowel sounds positive.  Musculoskeletal: no clubbing / cyanosis. No joint deformity upper and lower extremities. Good ROM, no contractures. Normal muscle tone.  Skin: no rashes, lesions, ulcers. No induration Neurologic: CN 2-12 grossly intact. Sensation intact, DTR normal. Strength 5/5 in all 4.  Psychiatric: Normal judgment and insight. Alert and oriented x 3. Normal mood.    Labs on Admission: I have personally reviewed following labs and imaging studies  CBC:  Recent Labs Lab 10/16/15 1558  WBC 9.1  NEUTROABS 6.3  HGB 9.4*  HCT 29.1*  MCV 91.5  PLT 628   Basic Metabolic Panel:  Recent Labs Lab 10/16/15 1558  NA 132*  K 4.3  CL 93*  CO2 25  GLUCOSE 101*  BUN 26*  CREATININE 1.04*  CALCIUM 9.6   Urine analysis:    Component Value Date/Time   COLORURINE YELLOW 10/16/2015 1840   APPEARANCEUR HAZY (A) 10/16/2015 1840   LABSPEC 1.020 10/16/2015 1840   PHURINE 5.5 10/16/2015 1840   GLUCOSEU NEGATIVE 10/16/2015 1840   HGBUR LARGE (A) 10/16/2015 [place] 10/16/2015 1840   KETONESUR 15 (A) 10/16/2015 1840   PROTEINUR 100 (A) 10/16/2015 1840   UROBILINOGEN 0.2 10/26/2013 1942   NITRITE NEGATIVE 10/16/2015 1840   LEUKOCYTESUR LARGE (A) 10/16/2015 1840    Radiological Exams on Admission: Ct Head Wo Contrast  Result Date: 10/16/2015 CLINICAL DATA:  Metastatic breast cancer, confusion, decreased appetite x 3-4 days EXAM: CT HEAD WITHOUT CONTRAST TECHNIQUE: Contiguous axial images  were obtained from the base of the skull through the vertex without intravenous contrast. COMPARISON:  02/16/2014 FINDINGS: Brain: No evidence  of acute infarction, hemorrhage, hydrocephalus, extra-axial collection or mass lesion/mass effect. Vascular: No hyperdense vessel or unexpected calcification. Skull: No evidence of calvarial fracture. Sinuses/Orbits: Layering fluid in the left sphenoid sinus. Visualized mastoid air cells are clear. Other: Mild subcortical white matter and periventricular small vessel ischemic changes. Mild cortical atrophy. No ventriculomegaly. IMPRESSION: No evidence of acute intracranial abnormality. Mild atrophy with small vessel ischemic changes. Electronically Signed   By: Julian Hy M.D.   On: 10/16/2015 18:38    EKG: Independently reviewed. 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 Acute Encephalopathy:  I suspect partly is from her pain meds and psychotropic medication side effects, or could well be secondary to UTI. Cr 1.04 and BUN 26. She is currently febrile. She was given IV Rocephin in the ER, will continue.  Her lactic acid is negative, and her BP is OK.  Will give some IVF fluids. 2. Hypokalemia. Start potassium supplementation. Recheck in am. 3. Anemia. H&H are low.  This is stable.  Continue to monitor and recheck CBC in the am. 4. HTN. Stable. Start on antihypertensives and monitor. 5. Depression. Stable 6. Infiltrating ductal carcinoma of left breast:  Will have her follow up with Dr Whitney Muse outpatient.   DVT prophylaxis: Heparin Code Status: Full Family Communication: Husband at bedside Disposition Plan: Discharge once improved Consults called: None Admission status: Admit to inpatient   Orvan Falconer, MD  FACP. [facility]  If 7PM-7AM, please contact  night-coverage [email or web address] Password [facility]  10/16/2015, 7:35 PM   By signing my name below, I, Hilbert Odor, attest that this documentation has been prepared under the direction and in the presence of Orvan Falconer, MD. Electronically signed: Hilbert Odor, Scribe.  10/16/15, 7:50 AM

## 2015-10-16 NOTE — ED Notes (Signed)
Report given to [place], Therapist, sports. Pt ready for transport.

## 2015-10-16 NOTE — ED Notes (Signed)
EDP in room with pt 

## 2015-10-16 NOTE — ED Notes (Signed)
IV access obtained, but was unable to draw labs. Phlebotomy notified.

## 2015-10-16 NOTE — Progress Notes (Signed)
Unable to accurately perform admission d/t pt's inappropriate response to questions and AMS. No family available.

## 2015-10-16 NOTE — ED Notes (Signed)
Scanner in room not working.  

## 2015-10-16 NOTE — ED Provider Notes (Signed)
[facility] DEPT Provider Note   CSN: [id number] Arrival date & time: 10/16/15  1415     History   Chief Complaint Chief Complaint  Patient presents with  . Hip Pain    HPI Brittany Blair is a 55 y.o. female.  HPI   33yF with confusion and generalized weakness. History primarily from husband at bedside and review of records. He says she can typically walk with an unsteady gait with some assistance. Since Friday she has become progressively weaker that she is "dead weight." She has been confused. Rambling thoughts. Talking about her brother like he is in the room. He committed suicide in 2012. She is fidgety. Her appetite has been poor and she essentially hasn't had anything to eat or drink since yesterday aside from a small amount of liquid when taking her medications. She has been incontinent of urine. He says she has not voiced any new complaints recently.   Past Medical History:  Diagnosis Date  . Anxiety   . B12 deficiency 05/12/2015  . Breast cancer ([place]) dx'd 05/27/2010   chemo/xrt comp 01/2011  . Breast lump    left  . 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 She was stage III. It was lower inner quadrant. It was receptor positive, HER-2/neu negative, and had a KI-67 of 75%.  She was treated with neoadjuvant chemotherapy. She was recently hospitalized (June, 2012,) with multiple superficial MRSA infections. These have all r  . Depression   . Genital herpes 08/04/2012  . Hematoma   . Herpes   . Hypertension   . Lymph edema   . MRSA (methicillin resistant Staphylococcus aureus)   . Multiple blisters    along surgical site  . PMB (postmenopausal bleeding) 06/23/2013  . Swelling    left foot  . Vaginal delivery 1981, 1984, 1987, 1992    Patient Active Problem List   Diagnosis Date Noted  . B12 deficiency 05/12/2015  . H/O  hysterectomy with oophorectomy 04/22/2015  . Cancer related pain 04/05/2014  . Left sided numbness 02/14/2014  . Hypotension 02/14/2014  . Hypocalcemia 02/14/2014  . 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 GIB (gastrointestinal bleeding) 10/27/2013  . Syncope and collapse 10/27/2013  . Prepyloric ulcer 10/27/2013  . Acute blood loss anemia 10/26/2013  . Abdominal pain 10/26/2013  . AKI (acute kidney injury) ([place]) 10/26/2013  . PMB (postmenopausal bleeding) 06/23/2013  . Genital herpes 08/04/2012  . Syncope 04/28/2012  . GAD (generalized anxiety disorder) 04/28/2012  . HTN (hypertension), benign 04/28/2012  . Chronic chest pain 04/28/2012  . Depression 04/28/2012  . Transaminitis 04/28/2012  . Carcinoma of breast metastatic to bone ([place]) 05/28/2010    Class: Stage 3    Past Surgical History:  Procedure Laterality Date  . BONE BIOPSY Left 11/2013   left post. hip  . CYSTOSCOPY N/A 10/14/2013   Procedure: CYSTOSCOPY;  Surgeon: Ena Dawley, MD;  Location: [facility];  Service: Gynecology;  Laterality: N/A;  . ESOPHAGOGASTRODUODENOSCOPY N/A 10/27/2013   Procedure: ESOPHAGOGASTRODUODENOSCOPY (EGD);  Surgeon: Wonda Horner, MD;  Location: [facility];  Service: Endoscopy;  Laterality: N/A;  . LAPAROSCOPIC ASSISTED VAGINAL HYSTERECTOMY Bilateral 10/14/2013   Procedure: LAPAROSCOPIC ASSISTED VAGINAL HYSTERECTOMY;  Surgeon: Ena Dawley, MD;  Location: [facility];  Service: Gynecology;  Laterality: Bilateral;  . LAPAROSCOPIC BILATERAL SALPINGO OOPHERECTOMY Bilateral 10/14/2013   Procedure: LAPAROSCOPIC  BILATERAL SALPINGO OOPHORECTOMY;  Surgeon: Ena Dawley, MD;  Location: [facility];  Service: Gynecology;  Laterality: Bilateral;  . LAPAROSCOPIC LYSIS OF ADHESIONS N/A 10/14/2013   Procedure: LAPAROSCOPIC LYSIS OF ADHESIONS;  Surgeon: Ena Dawley, MD;  Location: [facility];  Service: Gynecology;  Laterality: N/A;  . MASTECTOMY MODIFIED RADICAL  10/10/10    left -Dr Margot Chimes   . TUBAL LIGATION    . WISDOM TOOTH EXTRACTION      OB History    Gravida Para Term Preterm AB Living   4 4       4    SAB TAB Ectopic Multiple Live Births                   Home Medications    Prior to Admission medications   Medication Sig Start Date End Date Taking? Authorizing Provider  acyclovir (ZOVIRAX) 400 MG tablet TAKE 1 TABLET BY MOUTH TWICE DAILY 06/05/15   Baird Cancer, PA-C  ALPRAZolam Duanne Moron) 1 MG tablet TAKE 1 TABLET BY MOUTH 3 TIMES DAILY AS NEEDED FOR ANXIETY 05/12/15   Baird Cancer, PA-C  amLODipine (NORVASC) 5 MG tablet TAKE 1 TABLET BY MOUTH TWICE DAILY 12/19/14   Baird Cancer, PA-C  bacitracin-polymyxin b (POLYSPORIN) ophthalmic ointment Place 1 application into both eyes 3 (three) times daily. apply 1 inch to each eye 3 times a day 04/07/15   Patrici Ranks, MD  calcium-vitamin D (OSCAL WITH D) 500-200 MG-UNIT per tablet Take 2 tablets by mouth daily with breakfast. 01/14/14   Baird Cancer, PA-C  clotrimazole (LOTRIMIN) 1 % cream Apply 1 application topically 2 (two) times daily as needed (yeast rash).    Historical Provider, MD  cyclobenzaprine (FLEXERIL) 10 MG tablet TAKE 1 TABLET BY MOUTH 3 TIMES DAILY AS NEEDED FOR MUSCLE SPASMS 09/05/15   Baird Cancer, PA-C  letrozole [facility]) 2.5 MG tablet Take 1 tablet (2.5 mg total) by mouth daily. 12/15/14   Baird Cancer, PA-C  lisinopril (PRINIVIL,ZESTRIL) 40 MG tablet TAKE 1 TABLET BY MOUTH ONCE DAILY 06/27/15   Baird Cancer, PA-C  loratadine (CLARITIN) 10 MG tablet Take 10 mg by mouth daily as needed for allergies.     Historical Provider, MD  metoprolol succinate (TOPROL-XL) 25 MG 24 hr tablet TAKE 3 TABLETS BY MOUTH ONCE DAILY WITH OR IMMEDIATELY FOLLOWING A MEAL 05/09/15   Baird Cancer, PA-C  morphine (MS CONTIN) 60 MG 12 hr tablet Take 2 tablet in AM, take 2 tablet in afternoon, take 2 tablets in evening 09/26/15   Manon Hilding Kefalas, PA-C  OVER THE COUNTER MEDICATION Take 1 oz by mouth 2  (two) times daily. Liquid calcium with vitamin D    Historical Provider, MD  Oxycodone HCl 10 MG TABS Take 1 or 2 tablets every 4 hours to control pain. 09/21/15   Baird Cancer, PA-C  palbociclib (IBRANCE) 100 MG capsule Take 1 capsule daily with food for 21 days, rest 7 days, then resume taking the medicine 3 weeks on, 1 week off. 05/23/15   Baird Cancer, PA-C  polyethylene glycol (MIRALAX / GLYCOLAX) packet Take 8.5 g by mouth daily as needed for moderate constipation.    Historical Provider, MD  pregabalin (LYRICA) 75 MG capsule Take two capsules in the morning and and 2 capsules at night 06/30/15   Patrici Ranks, MD  promethazine (PHENERGAN) 25 MG tablet Take 1 tablet (25 mg total) by mouth every 6 (six) hours as needed for  nausea or vomiting. 06/16/15   Baird Cancer, PA-C  temazepam (RESTORIL) 15 MG capsule TAKE 1 TO 2 CAPSULES BY MOUTH AT BEDTIME AS NEEDED FOR SLEEP 07/27/15   Baird Cancer, PA-C  tetrahydrozoline-zinc (VISINE-AC) 0.05-0.25 % ophthalmic solution Place 2 drops into both eyes 3 (three) times daily as needed (for allergies).     Historical Provider, MD  traZODone (DESYREL) 50 MG tablet TAKE 1 TABLET BY MOUTH AT BEDTIME. 05/18/15   Patrici Ranks, MD  venlafaxine XR (EFFEXOR-XR) 75 MG 24 hr capsule TAKE 3 CAPSULES BY MOUTH DAILY 08/07/15   Baird Cancer, PA-C    Family History Family History  Problem Relation Age of Onset  . Heart disease Father     heart attack  . Hypertension Daughter   . Hypertension Son   . Cancer Maternal Grandmother     ovarian  . Hypertension Maternal Grandfather   . Cancer Paternal Grandmother     ovarian  . Hypertension Paternal Grandfather     Social History Social History  Substance Use Topics  . Smoking status: Current Every Day Smoker    Packs/day: 0.35    Types: Cigarettes    Last attempt to quit: 09/26/2013  . Smokeless tobacco: Never Used  . Alcohol use 12.6 oz/week    21 Glasses of wine per week     Allergies     Bee venom   Review of Systems Review of Systems  All systems reviewed and negative, other than as noted in HPI.  Physical Exam Updated Vital Signs BP (!) 163/124 (BP Location: Right Arm)   Pulse 120   Temp 98.6 F (37 C) (Oral)   Resp 12   Ht 5' 5"  (1.651 m)   Wt 247 lb (112 kg)   LMP 05/29/2013   SpO2 100%   BMI 41.10 kg/m   Physical Exam  Constitutional: She appears well-developed and well-nourished. No distress.  HENT:  Head: Normocephalic and atraumatic.  Eyes: Conjunctivae are normal.  Neck: Neck supple.  Cardiovascular: Normal rate and regular rhythm.   No murmur heard. Pulmonary/Chest: Effort normal and breath sounds normal. No respiratory distress.  Abdominal: Soft. There is no tenderness.  Musculoskeletal: She exhibits no edema.  Neurological:  Laying in bed with eyes open. Picking at bed sheets. Mumbling to self. She will start to answer questions but will often trail off in mid sentence or begin talking about something completely unrelated. Follows simple commands such as squeeze fingers but has difficulty following along with finger to nose. Globally weak. Limited movement of LE because of pain.   Skin: Skin is warm and dry. There is erythema.  Faint erythema to the dorsal aspect of the right forearm and upper arm. Mildly increased warmth. Does not seem to be tender.  Nursing note and vitals reviewed.    ED Treatments / Results  Labs (all labs ordered are listed, but only abnormal results are displayed) Labs Reviewed  CBC WITH DIFFERENTIAL/PLATELET - Abnormal; Notable for the following:       Result Value   RBC 3.18 (*)    Hemoglobin 9.4 (*)    HCT 29.1 (*)    Monocytes Absolute 1.4 (*)    All other components within normal limits  BASIC METABOLIC PANEL - Abnormal; Notable for the following:    Sodium 132 (*)    Chloride 93 (*)    Glucose, Bld 101 (*)    BUN 26 (*)    Creatinine, Ser 1.04 (*)  GFR calc non Af Amer 59 (*)    All other  components within normal limits  URINALYSIS, ROUTINE W REFLEX MICROSCOPIC (NOT AT [facility]) - Abnormal; Notable for the following:    APPearance HAZY (*)    Hgb urine dipstick LARGE (*)    Ketones, ur 15 (*)    Protein, ur 100 (*)    Leukocytes, UA LARGE (*)    All other components within normal limits  URINE MICROSCOPIC-ADD ON - Abnormal; Notable for the following:    Squamous Epithelial / LPF 6-30 (*)    Bacteria, UA MANY (*)    All other components within normal limits  CULTURE, BLOOD (ROUTINE X 2)  CULTURE, BLOOD (ROUTINE X 2)  LACTIC ACID, PLASMA  LACTIC ACID, PLASMA    EKG  EKG Interpretation  Date/Time:  Monday October 16 2015 14:39:07 EDT Ventricular Rate:  108 PR Interval:    QRS Duration: 78 QT Interval:  313 QTC Calculation: 420 R Axis:   60 Text Interpretation:  Sinus tachycardia Borderline T abnormalities, anterior leads Baseline wander in lead(s) II III aVR aVF V1 V3 V4 V5 V6 No significant change since last tracing Confirmed by Maryan Rued  MD, Loree Fee ([id number]) on 10/17/2015 9:08:42 PM       Radiology No results found.  Procedures Procedures (including critical care time)  Medications Ordered in ED Medications  sodium chloride 0.9 % bolus 1,000 mL (1,000 mLs Intravenous New Bag/Given 10/16/15 1606)     Initial Impression / Assessment and Plan / ED Course  I have reviewed the triage vital signs and the nursing notes.  Pertinent labs & imaging results that were available during my care of the patient were reviewed by me and considered in my medical decision making (see chart for details).  Clinical Course    55 year old female with decreased mental status/confusion. Likely secondary to urinary tract infection. Urinalysis consistent with this. She is also on numerous sedating medications although she has been on these for quite some time. There is some mild erythema of her right upper extremity. Her husband thinks that this may just be from the way she has been  laying in the bed while in the emergency room. Not convinced that cellulitis. Rocephin for UTI should cover though. She is weak to the point where she cannot walk and her husband cannot assist her adequately at home. Will admit.  Final Clinical Impressions(s) / ED Diagnoses   Final diagnoses:  Acute encephalopathy  Encephalopathy acute    New Prescriptions New Prescriptions   No medications on file     Virgel Manifold, MD 10/21/15 2131

## 2015-10-16 NOTE — ED Notes (Signed)
Patient had dried feces all over her body and under her toenails and fingernails.  Patient has multiple red areas on her buttocks and under her arms.

## 2015-10-17 DIAGNOSIS — G9340 Encephalopathy, unspecified: Secondary | ICD-10-CM | POA: Diagnosis present

## 2015-10-17 DIAGNOSIS — E871 Hypo-osmolality and hyponatremia: Secondary | ICD-10-CM | POA: Diagnosis present

## 2015-10-17 DIAGNOSIS — R4182 Altered mental status, unspecified: Secondary | ICD-10-CM | POA: Diagnosis present

## 2015-10-17 DIAGNOSIS — Z79899 Other long term (current) drug therapy: Secondary | ICD-10-CM

## 2015-10-17 DIAGNOSIS — G8929 Other chronic pain: Secondary | ICD-10-CM | POA: Diagnosis present

## 2015-10-17 DIAGNOSIS — D638 Anemia in other chronic diseases classified elsewhere: Secondary | ICD-10-CM

## 2015-10-17 LAB — COMPREHENSIVE METABOLIC PANEL
ALK PHOS: 306 U/L — AB (ref 38–126)
ALT: 71 U/L — AB (ref 14–54)
AST: 100 U/L — AB (ref 15–41)
Albumin: 3.2 g/dL — ABNORMAL LOW (ref 3.5–5.0)
Anion gap: 15 (ref 5–15)
BUN: 19 mg/dL (ref 6–20)
CALCIUM: 9.2 mg/dL (ref 8.9–10.3)
CO2: 25 mmol/L (ref 22–32)
CREATININE: 0.82 mg/dL (ref 0.44–1.00)
Chloride: 96 mmol/L — ABNORMAL LOW (ref 101–111)
GFR calc non Af Amer: >60 mL/min (ref >60)
Glucose, Bld: 124 mg/dL — ABNORMAL HIGH (ref 65–99)
Potassium: 3.4 mmol/L — ABNORMAL LOW (ref 3.5–5.1)
SODIUM: 136 mmol/L (ref 135–145)
Total Bilirubin: 0.6 mg/dL (ref 0.3–1.2)
Total Protein: 8.2 g/dL — ABNORMAL HIGH (ref 6.5–8.1)

## 2015-10-17 LAB — MRSA PCR SCREENING: MRSA BY PCR: NEGATIVE

## 2015-10-17 LAB — RAPID URINE DRUG SCREEN, HOSP PERFORMED
Amphetamines: NOT DETECTED
Barbiturates: NOT DETECTED
Benzodiazepines: POSITIVE — AB
Cocaine: NOT DETECTED
OPIATES: POSITIVE — AB
Tetrahydrocannabinol: NOT DETECTED

## 2015-10-17 LAB — CBC
HCT: 33.4 % — ABNORMAL LOW (ref 36.0–46.0)
Hemoglobin: 10.7 g/dL — ABNORMAL LOW (ref 12.0–15.0)
MCH: 29.4 pg (ref 26.0–34.0)
MCHC: 32 g/dL (ref 30.0–36.0)
MCV: 91.8 fL (ref 78.0–100.0)
PLATELETS: 387 10*3/uL (ref 150–400)
RBC: 3.64 MIL/uL — AB (ref 3.87–5.11)
RDW: 14.4 % (ref 11.5–15.5)
WBC: 9.8 10*3/uL (ref 4.0–10.5)

## 2015-10-17 MED ORDER — LISINOPRIL 10 MG PO TABS
20.0000 mg | ORAL_TABLET | Freq: Every day | ORAL | Status: DC
  Administered 2015-10-17 – 2015-10-20 (×4): 20 mg via ORAL
  Filled 2015-10-17 (×4): qty 2

## 2015-10-17 MED ORDER — ACYCLOVIR 200 MG PO CAPS
400.0000 mg | ORAL_CAPSULE | Freq: Two times a day (BID) | ORAL | Status: DC
  Administered 2015-10-17 – 2015-10-22 (×11): 400 mg via ORAL
  Filled 2015-10-17 (×11): qty 2

## 2015-10-17 MED ORDER — OXYCODONE HCL 5 MG PO TABS
5.0000 mg | ORAL_TABLET | ORAL | Status: DC | PRN
  Administered 2015-10-18 – 2015-10-20 (×4): 5 mg via ORAL
  Filled 2015-10-17 (×4): qty 1

## 2015-10-17 MED ORDER — POTASSIUM CHLORIDE CRYS ER 20 MEQ PO TBCR
20.0000 meq | EXTENDED_RELEASE_TABLET | Freq: Two times a day (BID) | ORAL | Status: AC
  Administered 2015-10-17 – 2015-10-19 (×4): 20 meq via ORAL
  Filled 2015-10-17 (×4): qty 1

## 2015-10-17 MED ORDER — POTASSIUM CHLORIDE IN NACL 20-0.9 MEQ/L-% IV SOLN
INTRAVENOUS | Status: DC
  Administered 2015-10-17 – 2015-10-24 (×8): via INTRAVENOUS

## 2015-10-17 MED ORDER — PREGABALIN 50 MG PO CAPS
50.0000 mg | ORAL_CAPSULE | Freq: Two times a day (BID) | ORAL | Status: DC
  Administered 2015-10-17 – 2015-10-24 (×14): 50 mg via ORAL
  Filled 2015-10-17 (×14): qty 1

## 2015-10-17 NOTE — Clinical Social Work Note (Signed)
Clinical Social Work Assessment  Patient Details  Name: Brittany Blair MRN: [id number] Date of Birth: [birth year]  Date of referral:  10/17/15               Reason for consult:  Other (Comment Required) (Care giving needs)                Permission sought to share information with:    Permission granted to share information::     Name::        Agency::     Relationship::     Contact Information:     Housing/Transportation Living arrangements for the past 2 months:  Single Family Home Source of Information:  Spouse Patient Interpreter Needed:  None Criminal Activity/Legal Involvement Pertinent to Current Situation/Hospitalization:  No - Comment as needed Significant Relationships:  Adult Children, Spouse Lives with:  Spouse Do you feel safe going back to the place where you live?  Yes Need for family participation in patient care:  Yes (Comment)  Care giving concerns:  Spouse, Brittany Blair, advised that he and patient had been able to adequately provide care.    Social Worker assessment / plan: CSW spoke with Brittany Blair. Patient and spouse reside in the home, their children are grown and no longer at home.  Brittany Blair is the primary caregiver, however he works full time. At baseline, patient is able to ambulate independently, perform ADLs, spend a small amount of time in the kitchen and use the bedside commode. He advised that he is not ready for placement as they were managing prior to this infection this weekend.    Employment status:  Unemployed Forensic scientist:  Self Pay (Medicaid Pending) PT Recommendations:  Not assessed at this time Information / Referral to community resources:     Patient/Family's Response to care:  Patient is Medicaid potential which limits her options.  Patient/Family's Understanding of and Emotional Response to Diagnosis, Current Treatment, and Prognosis: Patient's spouse stated that he will consider all of his options/resources before making a  final decision.   Emotional Assessment Appearance:  Appears stated age Attitude/Demeanor/Rapport:  Other (Cooperative but confused) Affect (typically observed):  Accepting Orientation:  Oriented to Self Alcohol / Substance use:  Not Applicable Psych involvement (Current and /or in the community):  No (Comment)  Discharge Needs  Concerns to be addressed:   (Increased caregiver support if patient does not return to baseline. ) Readmission within the last 30 days:  No Current discharge risk:  Chronically ill, Cognitively Impaired Barriers to Discharge:  No Barriers Identified   Ihor Gully, LCSW 10/17/2015, 11:55 AM

## 2015-10-17 NOTE — Evaluation (Signed)
Physical Therapy Evaluation Patient Details Name: Brittany Blair MRN: [id number] DOB: 1960-12-17 Today's Date: 10/17/2015   History of Present Illness  Brittany Blair is a 55 y.o. 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 Her entire history is provided by her husband. He states that she can usually walk with some assistance but her walking has been in rapid decline over the last few days.  She also was noted to have trouble with her thought process.  There has been no new medication, HA, fever, chills, or any ill contact.  Dx: acute encephalopathy due to pain meds and psychotropic medication side effects, or UTI.  Anxiety, B12 deficiency, carcinoma of breast metastatic to bone with multiple supervision MRSA infections, genital herpes, hematoma, HTN, lymph edema, L foot swelling, L posterior hip bone biopsy, L mastectomy  Clinical Impression  Pt received in bed, no family present.  Pt continues to demonstrate altered mental status, and unsure if PLOF information that pt provided is accurate.  Today during PT evaluation she ambulated 4ft into the bathroom with Mod/Max A due to poor initiation, and need for PT to progress RW, R foot drop, and poor ability to navigate around objects.  RN notified that pt had a bout of diarrhea - she is total A for peri-hygiene.  D/c disposition includes SNF vs home with 24/7 supervision/assist and HHPT.  Unsure of what she has available at home as far as assistance and supervision - she was not able to accurately express this.      Follow Up Recommendations SNF;Home health PT;Supervision/Assistance - 24 hour    Equipment Recommendations   (TBD)    Recommendations for Other Services       Precautions / Restrictions  Precautions Precautions: Fall Precaution Comments: Due to AMS, and impaired mobility.  Restrictions Weight Bearing Restrictions: No      Mobility  Bed Mobility Overal bed mobility: Needs Assistance Bed Mobility: Supine to Sit;Sit to Supine     Supine to sit: Max assist Sit to supine: Mod assist      Transfers Overall transfer level: Needs assistance Equipment used: Rolling walker (2 wheeled) Transfers: Sit to/from Stand Sit to Stand: Mod assist;Max assist Stand pivot transfers: Mod assist;Max assist          Ambulation/Gait Ambulation/Gait assistance: Mod assist Ambulation Distance (Feet): 15 Feet Assistive device: Rolling walker (2 wheeled) Gait Pattern/deviations: Step-to pattern;Decreased dorsiflexion - right   Gait velocity interpretation: <1.8 ft/sec, indicative of risk for recurrent falls General Gait Details: gait is very effortful, and seems to be painful.  Pt moaning throughout gait.  R foot drop  Stairs            Wheelchair Mobility    Modified Rankin (Stroke Patients Only)       Balance Overall balance assessment: Needs assistance Sitting-balance support: Bilateral upper extremity supported       Standing balance support: Bilateral upper extremity supported Standing balance-Leahy Scale: Poor                               Pertinent Vitals/Pain Pain Assessment: Faces Faces Pain Scale: Hurts whole lot Pain Location: lower back Pain Descriptors / Indicators: Sharp Pain Intervention(s): Limited activity within patient's tolerance  Home Living   Living Arrangements: Spouse/significant other;Children (2 kids)   Type of Home: House Home Access: Stairs to enter   Entrance Stairs-Number of Steps: 1 step Home Layout: One level Home Equipment: Tub bench;Walker - 2 wheels;Cane - single point;Wheelchair - manual;Hospital bed (Pt is a poor historian - unsure of accuracy of answers. )      Prior Function     Gait /  Transfers Assistance Needed: independent.    ADL's / Homemaking Assistance Needed: Pt reports that she is independent with dressing and bathing.          Hand Dominance   Dominant Hand: Right    Extremity/Trunk Assessment   Upper Extremity Assessment: Generalized weakness (Unable to perform MMT due to poor cognition. )           Lower Extremity Assessment: Generalized weakness (Unable to perform MMT due to poor cognition)         Communication   Communication: Other (comment) (Pt not answering questions with related information at times. )  Cognition Arousal/Alertness: Awake/alert Behavior During Therapy: WFL for tasks assessed/performed Overall Cognitive Status: No family/caregiver present to determine baseline cognitive functioning       Memory: Decreased short-term memory              General Comments      Exercises        Assessment/Plan    PT Assessment    PT Diagnosis Difficulty walking;Abnormality of gait;Generalized weakness   PT Problem List    PT Treatment Interventions     PT Goals (Current goals can be found in the Care Plan section) Acute Rehab PT Goals PT Goal Formulation: Patient unable to participate in goal setting Time For Goal Achievement: 10/24/15 Potential to Achieve Goals: Fair    Frequency     Barriers to discharge        Co-evaluation               End of Session Equipment Utilized During Treatment: Gait belt Activity Tolerance: Patient tolerated treatment well Patient left: in bed;with nursing/sitter in room;with bed alarm set Nurse Communication: Mobility status    Functional Assessment Tool Used: [employer] "6-clicks"  Functional Limitation: Mobility: Walking and moving around Mobility: Walking and Moving Around Current Status [phone]): At least 60 percent but less than 80 percent impaired, limited or restricted Mobility: Walking and Moving Around Goal Status [phone]): At least 40 percent but  less than 60 percent impaired, limited or restricted    Time: 1113-1147 PT Time Calculation (min) (ACUTE ONLY): 34 min   Charges:   PT Evaluation $PT Eval Moderate Complexity: 1 Procedure PT Treatments $Gait Training: 8-22 mins   PT G Codes:   PT G-Codes **NOT FOR INPATIENT CLASS** Functional Assessment Tool Used: [employer] "6-clicks"  Functional Limitation: Mobility: Walking and moving around Mobility: Walking and Moving Around Current Status [phone]): At least 60 percent but less than 80 percent impaired, limited or restricted Mobility: Walking and Moving Around Goal Status [phone]): At least 40 percent but less than 60 percent impaired, limited or restricted    Beth Eliese Kerwood, PT, DPT X: [phone]

## 2015-10-17 NOTE — Progress Notes (Signed)
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 In the ED, she was mildly febrile. The CT scan of her head revealed mild atrophy with small vessel ischemic changes, but no acute disease. Her urinalysis revealed too numerous to count WBCs. She was admitted for UTI and acute encephalopathy.   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 Urinary tract infection. Blood cultures were ordered in the ED. I do not see a urine culture. She was started on Rocephin. Blood cultures are negative to date. Her white blood cell count is within normal limits. 2. Acute encephalopathy. Reportedly, the patient has some baseline confusion. Her confusion is rather significant, but she does not have any focal neurological deficits. Her daughter reports that she has been confused before, but "not like this". She is on multiple chronic psychotropic medications. Her TSH and ammonia level are within normal limits. 3. Essential hypertension. Currently stable. She is treated chronically with Norvasc, Toprol-XL and lisinopril. Due to her low normal blood pressures on admission, the dose of lisinopril was decreased and Norvasc was held. 4. Cancer related pain. Patient is treated chronically with Flexeril, Lyrica, MS Contin, OxyIR. Lyrica was restarted, but will decrease it to a lower dose. 5. Hyponatremia. Patient's sodium was 132 on admission, likely  from hypovolemia. She was started on D5 normal saline. 6. Depression. She was restarted on Effexor. Trazodone is being held. 7. Anemia of chronic disease.    Plan: 1. Continue current treatment with Rocephin. Will change IV fluids to take out dextrose as the patient is eating. 2. Some of her psychotropic medications were held on admission. Agree with descalating some of her chronic psychotropic medications. Due to her cancer pain, oxycodone at a lower dose will be added back when necessary. 3. As her blood pressure increases, lisinopril dosing may need to be titrated back up and/or Norvasc may need to be restarted. 4. Will order RPR and vitamin B12 level.    DVT prophylaxis; subcutaneous heparin Code Status: Full code Family Communication: Discussed briefly with daughter Ms. Utterback Disposition Plan: Discharge to home and clinically appropriate   Consultants:   None  Procedures:   None  Antimicrobials:   Rocephin 10/16/15   Subjective: Patient is very confused, but she does not endorse pain.  Objective: Vitals:   10/16/15 2139 10/16/15 2218 10/16/15 2230 10/17/15 0700  BP: 98/70 (!) 95/56  132/77  Pulse: 102 92  90  Resp: 13 16  18   Temp: 98.2 F (36.8 C) 98.8 F (37.1 C)  98.2 F (36.8 C)  TempSrc: Oral Oral  Oral  SpO2: 94% 95% 95% 100%  Weight:  102.9 kg (226 lb 13.7 oz)    Height:       No intake or output data in the 24 hours ending 10/17/15 0717 Filed Weights   10/16/15 1422 10/16/15 2218  Weight: 112 kg (247 lb) 102.9 kg (226 lb 13.7 oz)  Examination:  General exam: Appears calm and comfortable, but very confused. Respiratory system: Clear to auscultation. Respiratory effort normal. Cardiovascular system: S1 & S2 heard, RRR. No JVD, murmurs, rubs, gallops or clicks. No pedal edema. Gastrointestinal system: Abdomen is nondistended, soft and nontender. No organomegaly or masses felt. Normal bowel sounds heard. Central nervous system: Alert and  but not oriented. She believes she is at [employer]. She believes that she just had a baby. No focal neurological deficits. Extremities: Symmetric 5 x 5 power. Skin: No rashes, lesions or ulcers Psychiatry: Alert but very confused.     Data Reviewed: I have personally reviewed following labs and imaging studies  CBC:  Recent Labs Lab 10/16/15 1558  WBC 9.1  NEUTROABS 6.3  HGB 9.4*  HCT 29.1*  MCV 91.5  PLT [id number]   Basic Metabolic Panel:  Recent Labs Lab 10/16/15 1558  NA 132*  K 4.3  CL 93*  CO2 25  GLUCOSE 101*  BUN 26*  CREATININE 1.04*  CALCIUM 9.6   GFR: Estimated Creatinine Clearance: 72.8 mL/min (by C-G formula based on SCr of 1.04 mg/dL). Liver Function Tests: No results for input(s): AST, ALT, ALKPHOS, BILITOT, PROT, ALBUMIN in the last 168 hours. No results for input(s): LIPASE, AMYLASE in the last 168 hours.  Recent Labs Lab 10/16/15 2153  AMMONIA 23   Coagulation Profile: No results for input(s): INR, PROTIME in the last 168 hours. Cardiac Enzymes: No results for input(s): CKTOTAL, CKMB, CKMBINDEX, TROPONINI in the last 168 hours. BNP (last 3 results) No results for input(s): PROBNP in the last 8760 hours. HbA1C: No results for input(s): HGBA1C in the last 72 hours. CBG: No results for input(s): GLUCAP in the last 168 hours. Lipid Profile: No results for input(s): CHOL, HDL, LDLCALC, TRIG, CHOLHDL, LDLDIRECT in the last 72 hours. Thyroid Function Tests:  Recent Labs  10/16/15 1940  TSH 2.433   Anemia Panel: No results for input(s): VITAMINB12, FOLATE, FERRITIN, TIBC, IRON, RETICCTPCT in the last 72 hours. Sepsis Labs:  Recent Labs Lab 10/16/15 1558 10/16/15 2153  LATICACIDVEN 1.2 0.8    Recent Results (from the past 240 hour(s))  Blood culture (routine x 2)     Status: None (Preliminary result)   Collection Time: 10/16/15  7:24 PM  Result Value Ref Range Status   Specimen Description BLOOD RIGHT HAND  Final   Special Requests  BOTTLES DRAWN AEROBIC AND ANAEROBIC 6CC  Final   Culture PENDING  Incomplete   Report Status PENDING  Incomplete  Blood culture (routine x 2)     Status: None (Preliminary result)   Collection Time: 10/16/15  7:29 PM  Result Value Ref Range Status   Specimen Description BLOOD RIGHT HAND  Final   Special Requests BOTTLES DRAWN AEROBIC ONLY [place]  Final   Culture PENDING  Incomplete   Report Status PENDING  Incomplete         Radiology Studies: Ct Head Wo Contrast  Result Date: 10/16/2015 CLINICAL DATA:  Metastatic breast cancer, confusion, decreased appetite x 3-4 days EXAM: CT HEAD WITHOUT CONTRAST TECHNIQUE: Contiguous axial images were obtained from the base of the skull through the vertex without intravenous contrast. COMPARISON:  02/16/2014 FINDINGS: Brain: No evidence of acute infarction, hemorrhage, hydrocephalus, extra-axial collection or mass lesion/mass effect. Vascular: No hyperdense vessel or unexpected calcification. Skull: No evidence of calvarial fracture. Sinuses/Orbits: Layering fluid in the left sphenoid sinus. Visualized mastoid air cells are clear. Other: Mild subcortical white matter and periventricular small vessel ischemic changes. Mild  cortical atrophy. No ventriculomegaly. IMPRESSION: No evidence of acute intracranial abnormality. Mild atrophy with small vessel ischemic changes. Electronically Signed   By: Julian Hy M.D.   On: 10/16/2015 18:38   Dg Chest Portable 1 View  Result Date: 10/16/2015 CLINICAL DATA:  Cancer metastatic to bone, right-sided hip pain. Confusion. History of left breast cancer. EXAM: PORTABLE CHEST 1 VIEW COMPARISON:  None. FINDINGS: Study is hypoinspiratory. Given the low lung volumes, in the slight patient obliquity, lungs appear clear. No evidence of pneumonia. No pleural effusion or pneumothorax seen. Heart size is upper normal, likely accentuated by the low lung volumes. Surgical clips are seen about the left axilla. Old healed rib  fractures on the left were better seen on earlier CT. No evidence of acute osseous abnormality seen on today's chest x-ray. IMPRESSION: Low lung volumes.  No active disease seen. Electronically Signed   By: Franki Cabot M.D.   On: 10/16/2015 19:50        Scheduled Meds: . acyclovir  400 mg Oral BID  . amLODipine  5 mg Oral BID  . cefTRIAXone (ROCEPHIN)  IV  1 g Intravenous Q24H  . heparin  5,000 Units Subcutaneous Q8H  . letrozole  2.5 mg Oral Daily  . lisinopril  40 mg Oral Daily  . metoprolol succinate  25 mg Oral Daily  . pregabalin  150 mg Oral BID  . venlafaxine XR  225 mg Oral Daily   Continuous Infusions: . dextrose 5 % and 0.9% NaCl 75 mL/hr at 10/16/15 2230     LOS: 0 days    Time spent: 62 minutes    Rexene Alberts, MD [facility] Pager [phone]  If 7PM-7AM, please contact night-coverage [email or web address] Password [facility] 10/17/2015, 7:17 AM

## 2015-10-18 DIAGNOSIS — G893 Neoplasm related pain (acute) (chronic): Secondary | ICD-10-CM

## 2015-10-18 DIAGNOSIS — G9340 Encephalopathy, unspecified: Secondary | ICD-10-CM

## 2015-10-18 DIAGNOSIS — N390 Urinary tract infection, site not specified: Principal | ICD-10-CM

## 2015-10-18 LAB — CBC
HCT: 28.3 % — ABNORMAL LOW (ref 36.0–46.0)
HEMOGLOBIN: 9.5 g/dL — AB (ref 12.0–15.0)
MCH: 29.9 pg (ref 26.0–34.0)
MCHC: 33.6 g/dL (ref 30.0–36.0)
MCV: 89 fL (ref 78.0–100.0)
Platelets: 387 10*3/uL (ref 150–400)
RBC: 3.18 MIL/uL — AB (ref 3.87–5.11)
RDW: 14.1 % (ref 11.5–15.5)
WBC: 10.2 10*3/uL (ref 4.0–10.5)

## 2015-10-18 LAB — GLUCOSE, CAPILLARY: GLUCOSE-CAPILLARY: 119 mg/dL — AB (ref 65–99)

## 2015-10-18 LAB — BASIC METABOLIC PANEL
ANION GAP: 10 (ref 5–15)
BUN: 13 mg/dL (ref 6–20)
CHLORIDE: 99 mmol/L — AB (ref 101–111)
CO2: 23 mmol/L (ref 22–32)
Calcium: 8.6 mg/dL — ABNORMAL LOW (ref 8.9–10.3)
Creatinine, Ser: 0.67 mg/dL (ref 0.44–1.00)
GFR calc Af Amer: >60 mL/min (ref >60)
GFR calc non Af Amer: >60 mL/min (ref >60)
GLUCOSE: 109 mg/dL — AB (ref 65–99)
POTASSIUM: 3.4 mmol/L — AB (ref 3.5–5.1)
SODIUM: 132 mmol/L — AB (ref 135–145)

## 2015-10-18 LAB — VITAMIN B12: VITAMIN B 12: 5965 pg/mL — AB (ref 180–914)

## 2015-10-18 LAB — MAGNESIUM: Magnesium: 1.9 mg/dL (ref 1.7–2.4)

## 2015-10-18 NOTE — Progress Notes (Addendum)
Pt has pulled out multiple IVs, notified Dr Jerilee Hoh via [facility].  Pt also impulsive, getting out of bed without calling for assistance.  Pharmacist, hospital.

## 2015-10-18 NOTE — Progress Notes (Signed)
Pt husband at bedside, answered questions to complete admission information.

## 2015-10-18 NOTE — Progress Notes (Signed)
PROGRESS NOTE    Brittany Blair  [id number] DOB: 04/26/1960 DOA: 10/16/2015 PCP: No PCP Per Patient     Brief Narrative:  55 year old woman admitted to the hospital on 8/21 from home with complaints of increased confusion. 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 -We'll likely need 24-hour supervision at time of discharge. -Plan to minimize narcotics, continue to treat UTI.  UTI -Unfortunately, it appears urine culture was not ordered on admission. -She has been on Rocephin for 48 hours, will request urine culture but at this point may be negative. -Continue Rocephin at this point.  Hypokalemia -Continue to replace orally, check magnesium level.  Hypertension -Currently well controlled, treated chronically on Norvasc, Toprol and lisinopril. Given low normal blood pressures on admission, Norvasc has been held and lisinopril dose was decreased.  Cancer related pain -To continue OxyIR, Flexeril and MS Contin have been held, Lyrica is currently at a lower dose.   DVT prophylaxis: Subcutaneous heparin Code Status: Full code Family Communication: Patient only Disposition Plan: To be determined, it appears family has refused placement, patient currently is not competent to make own decisions.  Consultants:   None  Procedures:   None  Antimicrobials:   Rocephin    Subjective: Still  very confused, she states she is here because of a bleeding that won't stop, goes off on a tangent discussing about a door at home but will not close appropriately.  Objective: Vitals:   10/17/15 1450 10/17/15 2021 10/17/15 2328 10/18/15 0614  BP: 131/81 121/77  (!) 150/89  Pulse: (!) 101 (!) 102  100  Resp: (!) 22 (!) 21  20  Temp: 99 F (37.2 C) 98.7 F (37.1 C)  98.6 F (37 C)  TempSrc:  Oral  Oral  SpO2: 100% 100% 100% 100%  Weight:      Height:        Intake/Output Summary (Last 24 hours) at 10/18/15 1034 Last data filed at 10/17/15 1826  Gross per 24 hour  Intake          1251.25 ml  Output                0 ml  Net          1251.25 ml   Filed Weights   10/16/15 1422 10/16/15 2218  Weight: 112 kg (247 lb) 102.9 kg (226 lb 13.7 oz)    Examination:  General exam: Confused, not oriented, pleasant Respiratory system: Clear to auscultation. Respiratory effort normal. Cardiovascular system:RRR. No murmurs, rubs, gallops. Gastrointestinal system: Abdomen is nondistended, soft and nontender. No organomegaly or masses felt. Normal bowel sounds heard. Central nervous system: Confused, moves all 4 spontaneously Extremities: No C/C/E, +pedal pulses Skin: No rashes, lesions or ulcers Psychiatry: Unable to assess given current mental state    Data Reviewed: I have personally reviewed following labs and imaging studies  CBC:  Recent Labs Lab 10/16/15 1558 10/17/15 0735 10/18/15 0614  WBC 9.1 9.8 10.2  NEUTROABS 6.3  --   --   HGB 9.4* 10.7* 9.5*  HCT 29.1* 33.4* 28.3*  MCV 91.5 91.8 89.0  PLT 396 387 [id number]   Basic Metabolic Panel:  Recent Labs Lab 10/16/15 1558 10/17/15 0735 10/18/15 0614  NA 132* 136 132*  K 4.3 3.4* 3.4*  CL 93* 96* 99*  CO2 25 25 23   GLUCOSE 101* 124* 109*  BUN 26* 19 13  CREATININE 1.04* 0.82 0.67  CALCIUM 9.6 9.2 8.6*   GFR: Estimated Creatinine Clearance: 94.6 mL/min (by C-G formula based on SCr of 0.8 mg/dL). Liver Function  Tests:  Recent Labs Lab 10/17/15 0735  AST 100*  ALT 71*  ALKPHOS 306*  BILITOT 0.6  PROT 8.2*  ALBUMIN 3.2*   No results for input(s): LIPASE, AMYLASE in the last 168 hours.  Recent Labs Lab 10/16/15 2153  AMMONIA 23   Coagulation Profile: No results for input(s): INR, PROTIME in the last 168 hours. Cardiac Enzymes: No results for input(s): CKTOTAL, CKMB, CKMBINDEX, TROPONINI in the last 168 hours. BNP (last 3 results) No results for input(s): PROBNP in the last 8760 hours. HbA1C: No results for input(s): HGBA1C in the last 72 hours. CBG: No results for input(s): GLUCAP in the last 168 hours. Lipid Profile: No results for input(s): CHOL, HDL, LDLCALC, TRIG, CHOLHDL, LDLDIRECT in the last 72 hours. Thyroid Function Tests:  Recent Labs  10/16/15 1940  TSH 2.433   Anemia Panel: No results for input(s): VITAMINB12, FOLATE, FERRITIN, TIBC, IRON, RETICCTPCT in the last 72 hours. Urine analysis:    Component Value Date/Time   COLORURINE YELLOW 10/16/2015 1840   APPEARANCEUR HAZY (A) 10/16/2015 1840   LABSPEC 1.020 10/16/2015 1840   PHURINE 5.5 10/16/2015 1840   GLUCOSEU NEGATIVE 10/16/2015 1840   HGBUR LARGE (A) 10/16/2015 1840   BILIRUBINUR NEGATIVE 10/16/2015 1840   KETONESUR 15 (A) 10/16/2015 1840   PROTEINUR 100 (A) 10/16/2015 1840   UROBILINOGEN 0.2 10/26/2013 1942   NITRITE NEGATIVE 10/16/2015 1840   LEUKOCYTESUR LARGE (A) 10/16/2015 1840   Sepsis Labs: @LABRCNTIP (procalcitonin:4,lacticidven:4)  ) Recent Results (from the past 240 hour(s))  Blood culture (routine x 2)     Status: None (Preliminary result)   Collection Time: 10/16/15  7:24 PM  Result Value Ref Range Status   Specimen Description BLOOD RIGHT HAND  Final   Special Requests BOTTLES DRAWN AEROBIC AND ANAEROBIC 6CC  Final   Culture NO GROWTH 2 DAYS  Final   Report Status PENDING  Incomplete  Blood culture (routine x 2)     Status: None (Preliminary result)   Collection Time: 10/16/15   7:29 PM  Result Value Ref Range Status   Specimen Description BLOOD RIGHT HAND  Final   Special Requests BOTTLES DRAWN AEROBIC ONLY [place]  Final   Culture NO GROWTH 2 DAYS  Final   Report Status PENDING  Incomplete  MRSA PCR Screening     Status: None   Collection Time: 10/17/15  6:12 AM  Result Value Ref Range Status   MRSA by PCR NEGATIVE NEGATIVE Final    Comment:        The GeneXpert MRSA Assay (FDA approved for NASAL specimens only), is one component of a comprehensive MRSA colonization surveillance program. It is not intended to diagnose MRSA infection nor to guide or monitor treatment for MRSA infections.          Radiology Studies: Ct  Head Wo Contrast  Result Date: 10/16/2015 CLINICAL DATA:  Metastatic breast cancer, confusion, decreased appetite x 3-4 days EXAM: CT HEAD WITHOUT CONTRAST TECHNIQUE: Contiguous axial images were obtained from the base of the skull through the vertex without intravenous contrast. COMPARISON:  02/16/2014 FINDINGS: Brain: No evidence of acute infarction, hemorrhage, hydrocephalus, extra-axial collection or mass lesion/mass effect. Vascular: No hyperdense vessel or unexpected calcification. Skull: No evidence of calvarial fracture. Sinuses/Orbits: Layering fluid in the left sphenoid sinus. Visualized mastoid air cells are clear. Other: Mild subcortical white matter and periventricular small vessel ischemic changes. Mild cortical atrophy. No ventriculomegaly. IMPRESSION: No evidence of acute intracranial abnormality. Mild atrophy with small vessel ischemic changes. Electronically Signed   By: Julian Hy M.D.   On: 10/16/2015 18:38   Dg Chest Portable 1 View  Result Date: 10/16/2015 CLINICAL DATA:  Cancer metastatic to bone, right-sided hip pain. Confusion. History of left breast cancer. EXAM: PORTABLE CHEST 1 VIEW COMPARISON:  None. FINDINGS: Study is hypoinspiratory. Given the low lung volumes, in the slight patient obliquity, lungs appear  clear. No evidence of pneumonia. No pleural effusion or pneumothorax seen. Heart size is upper normal, likely accentuated by the low lung volumes. Surgical clips are seen about the left axilla. Old healed rib fractures on the left were better seen on earlier CT. No evidence of acute osseous abnormality seen on today's chest x-ray. IMPRESSION: Low lung volumes.  No active disease seen. Electronically Signed   By: Franki Cabot M.D.   On: 10/16/2015 19:50        Scheduled Meds: . acyclovir  400 mg Oral BID  . cefTRIAXone (ROCEPHIN)  IV  1 g Intravenous Q24H  . heparin  5,000 Units Subcutaneous Q8H  . letrozole  2.5 mg Oral Daily  . lisinopril  20 mg Oral Daily  . metoprolol succinate  25 mg Oral Daily  . potassium chloride  20 mEq Oral BID  . pregabalin  50 mg Oral BID  . venlafaxine XR  225 mg Oral Daily   Continuous Infusions: . 0.9 % NaCl with KCl 20 mEq / L Stopped (10/18/15 0700)     LOS: 1 day    Time spent: 25 minutes. Greater than 50% of this time was spent in direct contact with the patient coordinating care.     Lelon Frohlich, MD [facility] Pager [phone]  If 7PM-7AM, please contact night-coverage [email or web address] Password [facility] 10/18/2015, 10:34 AM

## 2015-10-18 NOTE — Care Management Note (Signed)
Case Management Note  Patient Details  Name: Brittany Blair MRN: [id number] Date of Birth: Jul 10, 1960  Subjective/Objective: Patient is from home with husband. I Spoke with husband on the phone briefly today. He states he is not interested in SNF for his wife at this time, as she has been ind with ADL's until now. He hopes after being treated for her UTI that she returns to her baseline. He would like home health for his wife. Patient does not have insurance. Spouse states he is working on getting disability for his wife. I gave him Mikael Spray number of [facility] to call in regards to home health as he has not returned any of her calls yet.               Action/Plan: Anticipate DC home with home health when appropriate.    Expected Discharge Date:                  Expected Discharge Plan:  Home/Self Care  In-House Referral:  Clinical Social Work  Discharge planning Services  CM Consult  Post Acute Care Choice:    Choice offered to:     DME Arranged:    DME Agency:     HH Arranged:    HH Agency:     Status of Service:  In process, will continue to follow  If discussed at Long Length of Stay Meetings, dates discussed:    Additional Comments:  Caileen Veracruz, Chauncey Reading, RN 10/18/2015, 2:44 PM

## 2015-10-19 ENCOUNTER — Inpatient Hospital Stay (HOSPITAL_COMMUNITY): Payer: Self-pay

## 2015-10-19 LAB — GLUCOSE, CAPILLARY: Glucose-Capillary: 114 mg/dL — ABNORMAL HIGH (ref 65–99)

## 2015-10-19 LAB — RPR: RPR Ser Ql: NONREACTIVE

## 2015-10-19 LAB — BASIC METABOLIC PANEL
Anion gap: 10 (ref 5–15)
BUN: 10 mg/dL (ref 6–20)
CHLORIDE: 101 mmol/L (ref 101–111)
CO2: 23 mmol/L (ref 22–32)
CREATININE: 0.7 mg/dL (ref 0.44–1.00)
Calcium: 8.4 mg/dL — ABNORMAL LOW (ref 8.9–10.3)
GFR calc Af Amer: >60 mL/min (ref >60)
GFR calc non Af Amer: >60 mL/min (ref >60)
GLUCOSE: 92 mg/dL (ref 65–99)
Potassium: 3.3 mmol/L — ABNORMAL LOW (ref 3.5–5.1)
SODIUM: 134 mmol/L — AB (ref 135–145)

## 2015-10-19 LAB — CBC
HCT: 31.6 % — ABNORMAL LOW (ref 36.0–46.0)
Hemoglobin: 10.7 g/dL — ABNORMAL LOW (ref 12.0–15.0)
MCH: 30.1 pg (ref 26.0–34.0)
MCHC: 33.9 g/dL (ref 30.0–36.0)
MCV: 88.8 fL (ref 78.0–100.0)
PLATELETS: 432 10*3/uL — AB (ref 150–400)
RBC: 3.56 MIL/uL — ABNORMAL LOW (ref 3.87–5.11)
RDW: 14.1 % (ref 11.5–15.5)
WBC: 11.8 10*3/uL — AB (ref 4.0–10.5)

## 2015-10-19 MED ORDER — HALOPERIDOL 2 MG PO TABS
2.0000 mg | ORAL_TABLET | Freq: Once | ORAL | Status: AC
Start: 2015-10-19 — End: 2015-10-19

## 2015-10-19 MED ORDER — HALOPERIDOL 2 MG PO TABS
2.0000 mg | ORAL_TABLET | Freq: Once | ORAL | Status: AC
  Administered 2015-10-19: 2 mg via ORAL
  Filled 2015-10-19: qty 1

## 2015-10-19 NOTE — Progress Notes (Signed)
Physical Therapy Treatment Patient Details Name: Brittany Blair MRN: [id number] DOB: 15-Mar-1960 Today's Date: 10/19/2015    History of Present Illness Brittany Blair is a 55 y.o. 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 Her entire history is provided by her husband. He states that she can usually walk with some assistance but her walking has been in rapid decline over the last few days.  She also was noted to have trouble with her thought process.  There has been no new medication, HA, fever, chills, or any ill contact.  Dx: acute encephalopathy due to pain meds and psychotropic medication side effects, or UTI.  Anxiety, B12 deficiency, carcinoma of breast metastatic to bone with multiple supervision MRSA infections, genital herpes, hematoma, HTN, lymph edema, L foot swelling, L posterior hip bone biopsy, L mastectomy    PT Comments    Pt received in bed, transport present and needing assistance to get pt into the w/c to take her for MRI.  Pt was found to be soiled, therefore RN assisted with peri-hygiene.  Pt having hallucinations and talking to people who are not present.  Pt expressed that her family was grieving and that she needed to be left alone.  Pt educated on need to go for MRI, and she was willing to participate in transfer to go down for the test.  She required Mod/Max A +2 for all functional mobility today.  Continue to recommend 24/7 supervision/assistance and additional PT once she is d/c from acute care.  If she goes home, she will need a w/c, RW, and BSC for safety with mobility.   Follow Up Recommendations  SNF;Home health PT;Supervision/Assistance - 24 hour     Equipment Recommendations  Rolling walker with 5" wheels;Wheelchair  (measurements PT);3in1 (PT)    Recommendations for Other Services       Precautions / Restrictions Precautions Precautions: Fall Precaution Comments: Due to AMS, and impaired mobility.  Restrictions Weight Bearing Restrictions: No    Mobility  Bed Mobility Overal bed mobility: Needs Assistance Bed Mobility: Supine to Sit;Rolling Rolling: Mod assist;+2 for physical assistance (For hygiene - pt found to have had a BM. )   Supine to sit: Max assist;+2 for physical assistance;HOB elevated        Transfers Overall transfer level: Needs assistance Equipment used: None Transfers: Sit to/from [employer] Sit to Stand: Max assist;+2 physical assistance Stand pivot transfers: Max assist;+2 physical assistance       General transfer comment: Poor initiation of movement, and poor posture.  Pt requires 1 -step cues and increased time with flexed posture and decreased initiation of mobility.   Ambulation/Gait Ambulation/Gait assistance:  (NA - pt going down for MRI)               Stairs            Wheelchair Mobility    Modified Rankin (Stroke Patients Only)       Balance           Standing balance support: Bilateral upper extremity supported Standing balance-Leahy Scale: Poor                      Cognition Arousal/Alertness: Awake/alert Behavior During Therapy:  (paranoid, talking to people who aren't present. )  Overall Cognitive Status: No family/caregiver present to determine baseline cognitive functioning                      Exercises      General Comments        Pertinent Vitals/Pain Pain Assessment:  (No evidence of pain during tx today. )    Home Living                      Prior Function            PT Goals (current goals can now be found in the care plan section) Acute Rehab PT Goals PT Goal Formulation: Patient unable to participate in goal setting Time For Goal Achievement:  10/24/15 Potential to Achieve Goals: Fair Progress towards PT goals: Not progressing toward goals - comment    Frequency  Min 5X/week    PT Plan Current plan remains appropriate    Co-evaluation             End of Session Equipment Utilized During Treatment: Gait belt Activity Tolerance: Patient tolerated treatment well Patient left:  (in w/c with transport taking her down for MRI)     Time: 1114-1130 PT Time Calculation (min) (ACUTE ONLY): 16 min  Charges:  $Therapeutic Activity: 8-22 mins                    G Codes:      Brittany Blair Oct 21, 2015, 12:37 PM

## 2015-10-19 NOTE — Progress Notes (Signed)
PROGRESS NOTE    Brittany Blair  [id number] DOB: Mar 05, 1960 DOA: 10/16/2015 PCP: No PCP Per Patient     Brief Narrative:  55 year old woman admitted to the hospital on 8/21 from home with complaints of increased confusion. 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 -Given she remains very confused, will request MRI Brain to r/o CVA or metastatic disease (CT scanner down at [facility]). -Will likely need 24-hour supervision at time of discharge. -Plan to minimize narcotics, continue to treat UTI.  UTI -Unfortunately, it appears urine culture was not ordered on admission. -She has been on Rocephin for 48 hours, will request urine culture but at this point may be negative. -Continue Rocephin.  Hypokalemia -Continue to replace orally. -Mg ok at 1.9.  Hypertension -Currently well controlled, treated chronically on Norvasc, Toprol and lisinopril. Given low normal blood pressures on admission, Norvasc has been held and lisinopril dose was decreased. -If BP continues to rise, will likely restart home BP meds in am.  Cancer related pain -To continue OxyIR, Flexeril and MS Contin have been held, Lyrica is currently at a lower dose.   DVT prophylaxis: Subcutaneous heparin Code Status: Full code Family Communication: Patient only Disposition Plan: To be determined, it appears family has refused placement,  patient currently is not competent to make own decisions.  Consultants:   None  Procedures:   None  Antimicrobials:   Rocephin    Subjective: Still very confused, when asked where she is she states "in the [place]".  Objective: Vitals:   10/18/15 2012 10/18/15 2044 10/19/15 0602 10/19/15 1318  BP:  134/73 (!) 151/92 (!) 151/103  Pulse:  97 96 92  Resp:  20 17 18   Temp:  98.6 F (37 C) 98.6 F (37 C) 98.7 F (37.1 C)  TempSrc:  Oral Oral Oral  SpO2: 100% 100% 100% 100%  Weight:      Height:        Intake/Output Summary (Last 24 hours) at 10/19/15 1411 Last data filed at 10/19/15 1300  Gross per 24 hour  Intake             2398 ml  Output                0 ml  Net             2398 ml   Filed Weights   10/16/15 1422 10/16/15 2218  Weight: 112 kg (247 lb) 102.9 kg (226 lb 13.7 oz)    Examination:  General exam: Confused, not oriented, pleasant Respiratory system: Clear to auscultation. Respiratory effort normal. Cardiovascular system:RRR. No murmurs, rubs, gallops. Gastrointestinal system: Abdomen is nondistended, soft and nontender. No organomegaly or masses felt. Normal bowel sounds heard. Central nervous system: Confused, moves all 4 spontaneously Extremities: No C/C/E, +pedal pulses Skin: No rashes, lesions or ulcers Psychiatry: Unable to assess given current mental state  Data Reviewed: I have personally reviewed following labs and imaging studies  CBC:  Recent Labs Lab 10/16/15 1558 10/17/15 0735 10/18/15 0614 10/19/15 0845  WBC 9.1 9.8 10.2 11.8*  NEUTROABS 6.3  --   --   --   HGB 9.4* 10.7* 9.5* 10.7*  HCT 29.1* 33.4* 28.3* 31.6*  MCV 91.5 91.8 89.0 88.8  PLT 396 387 387 [id number]*   Basic Metabolic Panel:  Recent Labs Lab 10/16/15 1558 10/17/15 0735 10/18/15 0614 10/19/15 0845  NA 132* 136 132* 134*  K 4.3 3.4* 3.4* 3.3*  CL 93* 96* 99* 101  CO2 25 25 23 23   GLUCOSE 101* 124* 109* 92  BUN 26* 19 13 10   CREATININE 1.04* 0.82  0.67 0.70  CALCIUM 9.6 9.2 8.6* 8.4*  MG  --   --  1.9  --    GFR: Estimated Creatinine Clearance: 94.6 mL/min (by C-G formula based on SCr of 0.8 mg/dL). Liver Function Tests:  Recent Labs Lab 10/17/15 0735  AST 100*  ALT 71*  ALKPHOS 306*  BILITOT 0.6  PROT 8.2*  ALBUMIN 3.2*   No results for input(s): LIPASE, AMYLASE in the last 168 hours.  Recent Labs Lab 10/16/15 2153  AMMONIA 23   Coagulation Profile: No results for input(s): INR, PROTIME in the last 168 hours. Cardiac Enzymes: No results for input(s): CKTOTAL, CKMB, CKMBINDEX, TROPONINI in the last 168 hours. BNP (last 3 results) No results for input(s): PROBNP in the last 8760 hours. HbA1C: No results for input(s): HGBA1C in the last 72 hours. CBG:  Recent Labs Lab 10/18/15 2047  GLUCAP 119*   Lipid Profile: No results for input(s): CHOL, HDL, LDLCALC, TRIG, CHOLHDL, LDLDIRECT in the last 72 hours. Thyroid Function Tests:  Recent Labs  10/16/15 1940  TSH 2.433   Anemia Panel:  Recent Labs  10/18/15 0614  VITAMINB12 5,965*   Urine analysis:    Component Value Date/Time   COLORURINE YELLOW 10/16/2015 1840   APPEARANCEUR HAZY (A) 10/16/2015 1840   LABSPEC 1.020 10/16/2015 1840   PHURINE 5.5 10/16/2015 1840   GLUCOSEU NEGATIVE 10/16/2015 1840   HGBUR LARGE (A) 10/16/2015 1840   BILIRUBINUR NEGATIVE 10/16/2015 1840   KETONESUR 15 (A) 10/16/2015 1840   PROTEINUR 100 (A) 10/16/2015 1840   UROBILINOGEN 0.2 10/26/2013 1942   NITRITE NEGATIVE 10/16/2015 1840   LEUKOCYTESUR LARGE (A) 10/16/2015 1840   Sepsis Labs: @LABRCNTIP (procalcitonin:4,lacticidven:4)  ) Recent Results (from the past 240 hour(s))  Blood culture (routine x 2)     Status: None (Preliminary result)   Collection Time: 10/16/15  7:24 PM  Result Value Ref Range Status   Specimen Description BLOOD RIGHT HAND  Final   Special Requests BOTTLES DRAWN AEROBIC AND ANAEROBIC 6CC  Final   Culture NO GROWTH 3 DAYS  Final   Report  Status PENDING  Incomplete  Blood culture (routine x 2)     Status: None (Preliminary result)   Collection Time: 10/16/15  7:29 PM  Result Value Ref Range Status   Specimen Description BLOOD RIGHT HAND  Final   Special Requests BOTTLES DRAWN AEROBIC ONLY [place]  Final   Culture NO GROWTH 3 DAYS  Final   Report Status PENDING  Incomplete  MRSA PCR Screening     Status: None   Collection Time: 10/17/15  6:12 AM  Result Value Ref Range Status   MRSA by PCR NEGATIVE NEGATIVE Final    Comment:        The GeneXpert MRSA Assay (FDA approved  for NASAL specimens only), is one component of a comprehensive MRSA colonization surveillance program. It is not intended to diagnose MRSA infection nor to guide or monitor treatment for MRSA infections.          Radiology Studies: Mr Brain 14 Contrast  Result Date: 10/19/2015 CLINICAL DATA:  55 year old female with increased confusion. Stage IV breast cancer. Subsequent encounter. EXAM: MRI HEAD WITHOUT CONTRAST TECHNIQUE: Multiplanar, multiecho pulse sequences of the brain and surrounding structures were obtained without intravenous contrast. COMPARISON:  Head CT without contrast 10/16/2015 and earlier. FINDINGS: Study is mildly degraded by motion artifact despite repeated imaging attempts. Major intracranial vascular flow voids are preserved. No restricted diffusion to suggest acute infarction. No midline shift, mass effect, evidence of mass lesion, ventriculomegaly, extra-axial collection or acute intracranial hemorrhage. Cervicomedullary junction and pituitary are within normal limits. No encephalomalacia. No chronic cerebral blood products. No contrast was administered, but no areas of cerebral edema are evident noncontrast gray and white matter signal is normal for age. Mildly heterogeneous bone marrow signal in the calvarium and upper cervical spine, but no destructive osseous lesion identified. Fluid level in the sphenoid sinus. Other paranasal  sinuses and mastoids are well pneumatized. Visible internal auditory structures appear normal. Negative orbit and scalp soft tissues. IMPRESSION: 1. No acute intracranial abnormality. Early metastatic disease to the brain is difficult to exclude in the absence of intravenous contrast. 2. Heterogeneous bone marrow signal which is nonspecific. No destructive osseous lesion identified. 3. Fluid level in the right sphenoid sinus suggesting sinusitis in the appropriate clinical setting. Electronically Signed   By: Genevie Ann M.D.   On: 10/19/2015 12:32        Scheduled Meds: . acyclovir  400 mg Oral BID  . cefTRIAXone (ROCEPHIN)  IV  1 g Intravenous Q24H  . heparin  5,000 Units Subcutaneous Q8H  . letrozole  2.5 mg Oral Daily  . lisinopril  20 mg Oral Daily  . metoprolol succinate  25 mg Oral Daily  . pregabalin  50 mg Oral BID  . venlafaxine XR  225 mg Oral Daily   Continuous Infusions: . 0.9 % NaCl with KCl 20 mEq / L 70 mL/hr at 10/19/15 1347     LOS: 2 days    Time spent: 25 minutes. Greater than 50% of this time was spent in direct contact with the patient coordinating care.     Lelon Frohlich, MD [facility] Pager [phone]  If 7PM-7AM, please contact night-coverage [email or web address] Password [email or web address] 10/19/2015, 2:11 PM

## 2015-10-20 ENCOUNTER — Inpatient Hospital Stay (HOSPITAL_COMMUNITY): Payer: Self-pay

## 2015-10-20 LAB — GLUCOSE, CAPILLARY
GLUCOSE-CAPILLARY: 99 mg/dL (ref 65–99)
GLUCOSE-CAPILLARY: 106 mg/dL — AB (ref 65–99)
Glucose-Capillary: 118 mg/dL — ABNORMAL HIGH (ref 65–99)
Glucose-Capillary: 120 mg/dL — ABNORMAL HIGH (ref 65–99)

## 2015-10-20 LAB — URINE CULTURE: CULTURE: NO GROWTH

## 2015-10-20 MED ORDER — POTASSIUM CHLORIDE CRYS ER 20 MEQ PO TBCR
40.0000 meq | EXTENDED_RELEASE_TABLET | Freq: Once | ORAL | Status: AC
  Administered 2015-10-22: 40 meq via ORAL
  Filled 2015-10-20: qty 2

## 2015-10-20 MED ORDER — LISINOPRIL 10 MG PO TABS
40.0000 mg | ORAL_TABLET | Freq: Every day | ORAL | Status: DC
  Administered 2015-10-21 – 2015-10-24 (×4): 40 mg via ORAL
  Filled 2015-10-20 (×4): qty 4

## 2015-10-20 MED ORDER — IOPAMIDOL (ISOVUE-300) INJECTION 61%
75.0000 mL | Freq: Once | INTRAVENOUS | Status: AC | PRN
  Administered 2015-10-20: 75 mL via INTRAVENOUS

## 2015-10-20 MED ORDER — LISINOPRIL 10 MG PO TABS
20.0000 mg | ORAL_TABLET | Freq: Once | ORAL | Status: DC
  Filled 2015-10-20: qty 2

## 2015-10-20 NOTE — NC FL2 (Signed)
[place] MEDICAID FL2 LEVEL OF CARE SCREENING TOOL     IDENTIFICATION  Patient Name: Brittany Blair Birthdate: 1960/12/16 Sex: female Admission Date (Current Location): 10/16/2015  [facility] and [place] Number:  [employer] and Address:  [place] [address] [place]      Provider Number: [id number]  Attending Physician Name and Address:  Koleen Nimrod Acost*  Relative Name and Phone Number:       Current Level of Care: Hospital Recommended Level of Care: [place] Prior Approval Number:    Date Approved/Denied:   PASRR Number: [id number] ([id number])  Discharge Plan: Home    Current Diagnoses: Patient Active Problem List   Diagnosis Date Noted  . Hyponatremia 10/17/2015  . Acute encephalopathy 10/17/2015  . Anemia of chronic disease 10/17/2015  . Polypharmacy 10/17/2015  . Altered mental status 10/17/2015  . UTI (lower urinary tract infection) 10/16/2015  . B12 deficiency 05/12/2015  . H/O hysterectomy with oophorectomy 04/22/2015  . Cancer related pain 04/05/2014  . Left sided numbness 02/14/2014  . Hypotension 02/14/2014  . Hypocalcemia 02/14/2014  . Infiltrating ductal carcinoma of left breast ([place]) 02/14/2014  . GIB (gastrointestinal bleeding) 10/27/2013  . Syncope and collapse 10/27/2013  . Prepyloric ulcer 10/27/2013  . Acute blood loss anemia 10/26/2013  . Abdominal pain 10/26/2013  . AKI (acute kidney injury) ([place]) 10/26/2013  . PMB (postmenopausal bleeding) 06/23/2013  . Genital herpes 08/04/2012  . Syncope 04/28/2012  . GAD (generalized anxiety disorder) 04/28/2012  . Essential hypertension 04/28/2012  . Chronic chest pain 04/28/2012  . Depression 04/28/2012  . Transaminitis 04/28/2012  . Carcinoma of breast metastatic to bone ([place]) 05/28/2010    Class: Stage 3    Orientation RESPIRATION BLADDER Height & Weight     Self  Normal Incontinent Weight: 226 lb 13.7 oz (102.9  kg) Height:  5\' 5"  (165.1 cm)  BEHAVIORAL SYMPTOMS/MOOD NEUROLOGICAL BOWEL NUTRITION STATUS      Incontinent Diet (Diet Regular)  AMBULATORY STATUS COMMUNICATION OF NEEDS Skin     Verbally Other (Comment) (Skin open, 0.5 cm wound right buttocks)                       Personal Care Assistance Level of Assistance  Bathing, Feeding, Dressing Bathing Assistance: Maximum assistance Feeding assistance: Limited assistance Dressing Assistance: Maximum assistance     Functional Limitations Info  Sight, Hearing, Speech Sight Info: Adequate Hearing Info: Adequate Speech Info: Adequate    SPECIAL CARE FACTORS FREQUENCY  PT (By licensed PT)     PT Frequency: 5x/week              Contractures      Additional Factors Info  Code Status, Allergies Code Status Info: Full Code Allergies Info: Bee Venon           Current Medications (10/20/2015):  This is the current hospital active medication list Current Facility-Administered Medications  Medication Dose Route Frequency Provider Last Rate Last Dose  . 0.9 % NaCl with KCl 20 mEq/ L  infusion   Intravenous Continuous Rexene Alberts, MD 70 mL/hr at 10/19/15 1347    . acyclovir (ZOVIRAX) 200 MG capsule 400 mg  400 mg Oral BID Rexene Alberts, MD   400 mg at 10/20/15 1001  . cefTRIAXone (ROCEPHIN) 1 g in dextrose 5 % 50 mL IVPB  1 g Intravenous Q24H Orvan Falconer, MD   1 g at 10/19/15 2109  . clotrimazole (LOTRIMIN) 1 %  cream 1 application  1 application Topical BID PRN Orvan Falconer, MD      . heparin injection 5,000 Units  5,000 Units Subcutaneous Q8H Orvan Falconer, MD   5,000 Units at 10/20/15 1400  . letrozole [facility]) tablet 2.5 mg  2.5 mg Oral Daily Orvan Falconer, MD   2.5 mg at 10/20/15 1000  . lisinopril (PRINIVIL,ZESTRIL) tablet 20 mg  20 mg Oral Once Erline Hau, MD      . Derrill Memo ON 10/21/2015] lisinopril (PRINIVIL,ZESTRIL) tablet 40 mg  40 mg Oral Daily Estela Leonie Green, MD      . metoprolol succinate (TOPROL-XL) 24 hr  tablet 25 mg  25 mg Oral Daily Orvan Falconer, MD   25 mg at 10/20/15 1001  . naphazoline-glycerin (CLEAR EYES) ophth solution 1 drop  1 drop Both Eyes QID PRN Orvan Falconer, MD      . oxyCODONE (Oxy IR/ROXICODONE) immediate release tablet 5 mg  5 mg Oral Q4H PRN Rexene Alberts, MD   5 mg at 10/20/15 0517  . potassium chloride SA (K-DUR,KLOR-CON) CR tablet 40 mEq  40 mEq Oral Once Erline Hau, MD      . pregabalin (LYRICA) capsule 50 mg  50 mg Oral BID Rexene Alberts, MD   50 mg at 10/20/15 1001  . temazepam (RESTORIL) capsule 15 mg  15 mg Oral QHS PRN Orvan Falconer, MD   15 mg at 10/19/15 2109  . venlafaxine XR (EFFEXOR-XR) 24 hr capsule 225 mg  225 mg Oral Daily Orvan Falconer, MD   225 mg at 10/20/15 1001     Discharge Medications: Please see discharge summary for a list of discharge medications.  Relevant Imaging Results:  Relevant Lab Results:   Additional Information MRSA 08/28/10  Ihor Gully, LCSW

## 2015-10-20 NOTE — Clinical Social Work Placement (Signed)
   CLINICAL SOCIAL WORK PLACEMENT  NOTE  Date:  10/20/2015  Patient Details  Name: Brittany Blair MRN: [id number] Date of Birth: 01-May-1960  Clinical Social Work is seeking post-discharge placement for this patient at the [place] level of care (*CSW will initial, date and re-position this form in  chart as items are completed):  Yes   Patient/family provided with [facility] Work Department's list of facilities offering this level of care within the geographic area requested by the patient (or if unable, by the patient's family).  Yes   Patient/family informed of their freedom to choose among providers that offer the needed level of care, that participate in Medicare, Medicaid or managed care program needed by the patient, have an available bed and are willing to accept the patient.  Yes   Patient/family informed of [facility]'s ownership interest in [facility] and [facility], as well as of the fact that they are under no obligation to receive care at these facilities.  PASRR submitted to EDS on 10/20/15     PASRR number received on 10/20/15     Existing PASRR number confirmed on       FL2 transmitted to all facilities in geographic area requested by pt/family on       FL2 transmitted to all facilities within larger geographic area on       Patient informed that his/her managed care company has contracts with or will negotiate with certain facilities, including the following:            Patient/family informed of bed offers received.  Patient chooses bed at       Physician recommends and patient chooses bed at      Patient to be transferred to   on  .  Patient to be transferred to facility by       Patient family notified on   of transfer.  Name of family member notified:        PHYSICIAN       Additional Comment:    _______________________________________________ Ihor Gully, LCSW 10/20/2015, 2:02 PM

## 2015-10-20 NOTE — Clinical Social Work Note (Signed)
Patient's husband has contacted Disability in regards to having patient re-certified.  Mr. Balicki advised that he spoke with Arline Asp at Disability who was mailing him a form to complete and mail back.   CSW left a message for Jackalyn Lombard at disability to discuss situation.   CSW faxed clinicals to area facilities.   Srihith Aquilino, Clydene Pugh, LCSW

## 2015-10-20 NOTE — Progress Notes (Signed)
PROGRESS NOTE    Brittany Blair  [id number] DOB: [birth year] DOA: 10/16/2015 PCP: No PCP Per Patient     Brief Narrative:  55 year old woman admitted to the hospital on 8/21 from home with complaints of increased confusion. 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 She continues to be confused. MRI negative but unable to exclude brain mets given lack of IV contrast.   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 -Given she remains very confused, will request MRI Brain to r/o CVA or metastatic disease (negative for CVA but unable to exclude early metastatic disease). -Will request CT WWO contrast to further evaluate. -Will likely need 24-hour supervision at time of discharge. -Plan to minimize narcotics, continue to treat UTI.  UTI -Unfortunately, it appears urine culture was not ordered on admission. -Will request urine culture but at this point may be negative. -Continue Rocephin pending urine cx data or treat for 7 days total.  Hypokalemia -Continue to replace orally. -Mg ok at 1.9.  Hypertension -BP starting to rise. -Will resume home lisinopril.  Cancer related pain -OxyIR, Flexeril and MS Contin have been held, Lyrica is currently at a lower dose.   DVT prophylaxis: Subcutaneous heparin Code Status: Full code Family Communication: Patient only Disposition Plan: Likely home with [facility] therapies Consultants:   None  Procedures:     None  Antimicrobials:   Rocephin    Subjective: Much more alert today. Is Ox3.  Objective: Vitals:   10/19/15 1318 10/19/15 1946 10/19/15 2202 10/20/15 0526  BP: (!) 151/103  (!) 155/89 (!) 158/90  Pulse: 92  87 80  Resp: 18  17 19   Temp: 98.7 F (37.1 C)  98.9 F (37.2 C) 99 F (37.2 C)  TempSrc: Oral  Oral Oral  SpO2: 100% 97% 99% 100%  Weight:      Height:        Intake/Output Summary (Last 24 hours) at 10/20/15 1137 Last data filed at 10/19/15 1800  Gross per 24 hour  Intake            896.5 ml  Output                0 ml  Net            896.5 ml   Filed Weights   10/16/15 1422 10/16/15 2218  Weight: 112 kg (247 lb) 102.9 kg (226 lb 13.7 oz)    Examination:  General exam: more alert, ox 3, not to situation Respiratory system: Clear to auscultation. Respiratory effort normal. Cardiovascular system:RRR. No murmurs, rubs, gallops. Gastrointestinal system: Abdomen is nondistended, soft and nontender. No organomegaly or masses felt. Normal bowel sounds heard. Central nervous system: Confused, moves all 4 spontaneously Extremities: No C/C/E, +pedal pulses Skin: No rashes, lesions or ulcers Psychiatry: Unable to assess given current mental state    Data Reviewed: I have personally reviewed following labs and imaging studies  CBC:  Recent Labs  Lab 10/16/15 1558 10/17/15 0735 10/18/15 0614 10/19/15 0845  WBC 9.1 9.8 10.2 11.8*  NEUTROABS 6.3  --   --   --   HGB 9.4* 10.7* 9.5* 10.7*  HCT 29.1* 33.4* 28.3* 31.6*  MCV 91.5 91.8 89.0 88.8  PLT 396 387 387 [id number]*   Basic Metabolic Panel:  Recent Labs Lab 10/16/15 1558 10/17/15 0735 10/18/15 0614 10/19/15 0845  NA 132* 136 132* 134*  K 4.3 3.4* 3.4* 3.3*  CL 93* 96* 99* 101  CO2 25 25 23 23   GLUCOSE 101* 124* 109* 92  BUN 26* 19 13 10   CREATININE 1.04* 0.82 0.67 0.70  CALCIUM 9.6 9.2 8.6* 8.4*  MG  --   --  1.9  --    GFR: Estimated Creatinine Clearance: 94.6 mL/min (by C-G formula based  on SCr of 0.8 mg/dL). Liver Function Tests:  Recent Labs Lab 10/17/15 0735  AST 100*  ALT 71*  ALKPHOS 306*  BILITOT 0.6  PROT 8.2*  ALBUMIN 3.2*   No results for input(s): LIPASE, AMYLASE in the last 168 hours.  Recent Labs Lab 10/16/15 2153  AMMONIA 23   Coagulation Profile: No results for input(s): INR, PROTIME in the last 168 hours. Cardiac Enzymes: No results for input(s): CKTOTAL, CKMB, CKMBINDEX, TROPONINI in the last 168 hours. BNP (last 3 results) No results for input(s): PROBNP in the last 8760 hours. HbA1C: No results for input(s): HGBA1C in the last 72 hours. CBG:  Recent Labs Lab 10/18/15 2047 10/19/15 2201 10/20/15 0736 10/20/15 1134  GLUCAP 119* 114* 99 118*   Lipid Profile: No results for input(s): CHOL, HDL, LDLCALC, TRIG, CHOLHDL, LDLDIRECT in the last 72 hours. Thyroid Function Tests: No results for input(s): TSH, T4TOTAL, FREET4, T3FREE, THYROIDAB in the last 72 hours. Anemia Panel:  Recent Labs  10/18/15 0614  VITAMINB12 5,965*   Urine analysis:    Component Value Date/Time   COLORURINE YELLOW 10/16/2015 1840   APPEARANCEUR HAZY (A) 10/16/2015 1840   LABSPEC 1.020 10/16/2015 1840   PHURINE 5.5 10/16/2015 1840   GLUCOSEU NEGATIVE 10/16/2015 1840   HGBUR LARGE (A) 10/16/2015 1840   BILIRUBINUR NEGATIVE 10/16/2015 1840   KETONESUR 15 (A) 10/16/2015 1840   PROTEINUR 100 (A) 10/16/2015 1840   UROBILINOGEN 0.2 10/26/2013 1942   NITRITE NEGATIVE 10/16/2015 1840   LEUKOCYTESUR LARGE (A) 10/16/2015 1840   Sepsis Labs: @LABRCNTIP (procalcitonin:4,lacticidven:4)  ) Recent Results (from the past 240 hour(s))  Blood culture (routine x 2)     Status: None (Preliminary result)   Collection Time: 10/16/15  7:24 PM  Result Value Ref Range Status   Specimen Description BLOOD RIGHT HAND  Final   Special Requests BOTTLES DRAWN AEROBIC AND ANAEROBIC 6CC  Final   Culture NO GROWTH 3 DAYS  Final   Report Status PENDING  Incomplete  Blood  culture (routine x 2)     Status: None (Preliminary result)   Collection Time: 10/16/15  7:29 PM  Result Value Ref Range Status   Specimen Description BLOOD RIGHT HAND  Final   Special Requests BOTTLES DRAWN AEROBIC ONLY [place]  Final   Culture NO GROWTH 3 DAYS  Final   Report Status PENDING  Incomplete  MRSA PCR Screening     Status: None   Collection Time: 10/17/15  6:12 AM  Result Value Ref Range Status   MRSA by PCR NEGATIVE NEGATIVE Final    Comment:        The GeneXpert MRSA Assay (FDA approved for NASAL specimens only),  is one component of a comprehensive MRSA colonization surveillance program. It is not intended to diagnose MRSA infection nor to guide or monitor treatment for MRSA infections.          Radiology Studies: Mr Brain 51 Contrast  Result Date: 10/19/2015 CLINICAL DATA:  55 year old female with increased confusion. Stage IV breast cancer. Subsequent encounter. EXAM: MRI HEAD WITHOUT CONTRAST TECHNIQUE: Multiplanar, multiecho pulse sequences of the brain and surrounding structures were obtained without intravenous contrast. COMPARISON:  Head CT without contrast 10/16/2015 and earlier. FINDINGS: Study is mildly degraded by motion artifact despite repeated imaging attempts. Major intracranial vascular flow voids are preserved. No restricted diffusion to suggest acute infarction. No midline shift, mass effect, evidence of mass lesion, ventriculomegaly, extra-axial collection or acute intracranial hemorrhage. Cervicomedullary junction and pituitary are within normal limits. No encephalomalacia. No chronic cerebral blood products. No contrast was administered, but no areas of cerebral edema are evident noncontrast gray and white matter signal is normal for age. Mildly heterogeneous bone marrow signal in the calvarium and upper cervical spine, but no destructive osseous lesion identified. Fluid level in the sphenoid sinus. Other paranasal sinuses and mastoids are well  pneumatized. Visible internal auditory structures appear normal. Negative orbit and scalp soft tissues. IMPRESSION: 1. No acute intracranial abnormality. Early metastatic disease to the brain is difficult to exclude in the absence of intravenous contrast. 2. Heterogeneous bone marrow signal which is nonspecific. No destructive osseous lesion identified. 3. Fluid level in the right sphenoid sinus suggesting sinusitis in the appropriate clinical setting. Electronically Signed   By: Genevie Ann M.D.   On: 10/19/2015 12:32        Scheduled Meds: . acyclovir  400 mg Oral BID  . cefTRIAXone (ROCEPHIN)  IV  1 g Intravenous Q24H  . heparin  5,000 Units Subcutaneous Q8H  . letrozole  2.5 mg Oral Daily  . lisinopril  20 mg Oral Daily  . metoprolol succinate  25 mg Oral Daily  . pregabalin  50 mg Oral BID  . venlafaxine XR  225 mg Oral Daily   Continuous Infusions: . 0.9 % NaCl with KCl 20 mEq / L 70 mL/hr at 10/19/15 1347     LOS: 3 days    Time spent: 25 minutes. Greater than 50% of this time was spent in direct contact with the patient coordinating care.     Lelon Frohlich, MD [facility] Pager [phone]  If 7PM-7AM, please contact night-coverage [email or web address] Password [facility] 10/20/2015, 11:37 AM

## 2015-10-21 LAB — CULTURE, BLOOD (ROUTINE X 2)
Culture: NO GROWTH
Culture: NO GROWTH

## 2015-10-21 LAB — GLUCOSE, CAPILLARY: Glucose-Capillary: 110 mg/dL — ABNORMAL HIGH (ref 65–99)

## 2015-10-21 NOTE — Progress Notes (Signed)
PROGRESS NOTE    Nicki Kaufer  [id number] DOB: Mar 08, 1960 DOA: 10/16/2015 PCP: No PCP Per Patient     Brief Narrative:  55 year old woman admitted to the hospital on 8/21 from home with complaints of increased confusion. 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 She continues to be confused. MRI negative but unable to exclude brain mets given lack of IV contrast. CT Head WWO negative.   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 -CT Head/MRI negative for CVA or metastatic disease. -Will request EEG to rule out seizure activity. -Will request neurology consultation when available on Monday.  UTI -Unfortunately, it appears urine culture was not ordered on admission. -Will request urine culture but at this point may be negative. -Continue Rocephin pending urine cx data or treat for 7 days total.  Hypokalemia -Continue to replace orally. -Mg ok at 1.9.  Hypertension -Fair control.  Cancer related pain -OxyIR, Flexeril and MS Contin have been held, Lyrica is currently at a lower dose.   DVT prophylaxis: Subcutaneous heparin Code Status: Full code Family Communication: Patient only Disposition Plan: Likely home with [facility] therapies Consultants:   Neurology when available  Procedures:   None  Antimicrobials:   Rocephin    Subjective: Is Ox3, but still is  situationally confused.  Objective: Vitals:   10/20/15 1348 10/20/15 2100 10/21/15 0610 10/21/15 1407  BP: (!) 155/94 (!) 145/93 138/86 (!) 134/93  Pulse: (!) 115 94 89 (!) 111  Resp: (!) 22 20 20 19   Temp: 98.1 F (36.7 C) 98.6 F (37 C) 98.7 F (37.1 C) 98.2 F (36.8 C)  TempSrc: Oral Oral Oral Oral  SpO2: 100% 100% 99% 100%  Weight:      Height:        Intake/Output Summary (Last 24 hours) at 10/21/15 1625 Last data filed at 10/21/15 1500  Gross per 24 hour  Intake           225.17 ml  Output                0 ml  Net           225.17 ml   Filed Weights   10/16/15 1422 10/16/15 2218  Weight: 112 kg (247 lb) 102.9 kg (226 lb 13.7 oz)    Examination:  General exam: more alert, ox 3, not to situation Respiratory system: Clear to auscultation. Respiratory effort normal. Cardiovascular system:RRR. No murmurs, rubs, gallops. Gastrointestinal system: Abdomen is nondistended, soft and nontender. No organomegaly or masses felt. Normal bowel sounds heard. Central nervous system: Confused, moves all 4 spontaneously Extremities: No C/C/E, +pedal pulses Skin: No rashes, lesions or ulcers Psychiatry: Unable to assess given current mental state    Data Reviewed: I have personally reviewed following labs and imaging studies  CBC:  Recent Labs Lab 10/16/15 1558 10/17/15 0735 10/18/15 0614 10/19/15 0845  WBC 9.1 9.8  10.2 11.8*  NEUTROABS 6.3  --   --   --   HGB 9.4* 10.7* 9.5* 10.7*  HCT 29.1* 33.4* 28.3* 31.6*  MCV 91.5 91.8 89.0 88.8  PLT 396 387 387 123456*   Basic Metabolic Panel:  Recent Labs Lab 10/16/15 1558 10/17/15 0735 10/18/15 0614 10/19/15 0845  NA 132* 136 132* 134*  K 4.3 3.4* 3.4* 3.3*  CL 93* 96* 99* 101  CO2 25 25 23 23   GLUCOSE 101* 124* 109* 92  BUN 26* 19 13 10   CREATININE 1.04* 0.82 0.67 0.70  CALCIUM 9.6 9.2 8.6* 8.4*  MG  --   --  1.9  --    GFR: Estimated Creatinine Clearance: 94.6 mL/min (by C-G formula based on SCr of 0.8  mg/dL). Liver Function Tests:  Recent Labs Lab 10/17/15 0735  AST 100*  ALT 71*  ALKPHOS 306*  BILITOT 0.6  PROT 8.2*  ALBUMIN 3.2*   No results for input(s): LIPASE, AMYLASE in the last 168 hours.  Recent Labs Lab 10/16/15 2153  AMMONIA 23   Coagulation Profile: No results for input(s): INR, PROTIME in the last 168 hours. Cardiac Enzymes: No results for input(s): CKTOTAL, CKMB, CKMBINDEX, TROPONINI in the last 168 hours. BNP (last 3 results) No results for input(s): PROBNP in the last 8760 hours. HbA1C: No results for input(s): HGBA1C in the last 72 hours. CBG:  Recent Labs Lab 10/20/15 0736 10/20/15 1134 10/20/15 1632 10/20/15 2040 10/21/15 0034  GLUCAP 99 118* 106* 120* 110*   Lipid Profile: No results for input(s): CHOL, HDL, LDLCALC, TRIG, CHOLHDL, LDLDIRECT in the last 72 hours. Thyroid Function Tests: No results for input(s): TSH, T4TOTAL, FREET4, T3FREE, THYROIDAB in the last 72 hours. Anemia Panel: No results for input(s): VITAMINB12, FOLATE, FERRITIN, TIBC, IRON, RETICCTPCT in the last 72 hours. Urine analysis:    Component Value Date/Time   COLORURINE YELLOW 10/16/2015 1840   APPEARANCEUR HAZY (A) 10/16/2015 1840   LABSPEC 1.020 10/16/2015 1840   PHURINE 5.5 10/16/2015 1840   GLUCOSEU NEGATIVE 10/16/2015 1840   HGBUR LARGE (A) 10/16/2015 1840   BILIRUBINUR NEGATIVE 10/16/2015 1840   KETONESUR 15 (A) 10/16/2015 1840   PROTEINUR 100 (A) 10/16/2015 1840   UROBILINOGEN 0.2 10/26/2013 1942   NITRITE NEGATIVE 10/16/2015 1840   LEUKOCYTESUR LARGE (A) 10/16/2015 1840   Sepsis Labs: @LABRCNTIP (procalcitonin:4,lacticidven:4)  ) Recent Results (from the past 240 hour(s))  Blood culture (routine x 2)     Status: None   Collection Time: 10/16/15  7:24 PM  Result Value Ref Range Status   Specimen Description BLOOD RIGHT HAND  Final   Special Requests BOTTLES DRAWN AEROBIC AND ANAEROBIC 6CC  Final   Culture NO GROWTH 5 DAYS  Final   Report Status  10/21/2015 FINAL  Final  Blood culture (routine x 2)     Status: None   Collection Time: 10/16/15  7:29 PM  Result Value Ref Range Status   Specimen Description BLOOD RIGHT HAND  Final   Special Requests BOTTLES DRAWN AEROBIC ONLY [place]  Final   Culture NO GROWTH 5 DAYS  Final   Report Status 10/21/2015 FINAL  Final  MRSA PCR Screening     Status: None   Collection Time: 10/17/15  6:12 AM  Result Value Ref Range Status   MRSA by PCR NEGATIVE NEGATIVE Final    Comment:        The GeneXpert MRSA Assay (FDA approved for NASAL specimens only), is one component of a comprehensive MRSA colonization  surveillance program. It is not intended to diagnose MRSA infection nor to guide or monitor treatment for MRSA infections.   Culture, Urine     Status: None   Collection Time: 10/18/15  5:40 PM  Result Value Ref Range Status   Specimen Description URINE, CLEAN CATCH  Final   Special Requests NONE  Final   Culture NO GROWTH Performed at [facility]   Final   Report Status 10/20/2015 FINAL  Final         Radiology Studies: Ct Head W & Wo Contrast  Result Date: 10/20/2015 CLINICAL DATA:  Acute encephalopathy. History of metastatic breast cancer EXAM: CT HEAD WITHOUT AND WITH CONTRAST TECHNIQUE: Contiguous axial images were obtained from the base of the skull through the vertex without and with intravenous contrast CONTRAST:  38mL ISOVUE-300 IOPAMIDOL (ISOVUE-300) INJECTION 61% COMPARISON:  MRI head 10/19/2015.  CT head 10/16/2015 FINDINGS: Brain: Generalized atrophy. Negative for acute or chronic infarction. Negative for hemorrhage or mass. No edema. No enhancing lesion identified postcontrast administration. Vascular: No hyperdense vessel or unexpected calcification. Skull: No lytic or sclerotic metastatic disease to the skull. Sinuses/Orbits: Air-fluid level sphenoid sinus unchanged. Remaining sinuses clear. Other: Negative IMPRESSION: No acute intracranial abnormality.  Negative  for metastatic disease. Air-fluid level sphenoid sinus. Electronically Signed   By: Franchot Gallo M.D.   On: 10/20/2015 19:39        Scheduled Meds: . acyclovir  400 mg Oral BID  . cefTRIAXone (ROCEPHIN)  IV  1 g Intravenous Q24H  . heparin  5,000 Units Subcutaneous Q8H  . letrozole  2.5 mg Oral Daily  . lisinopril  20 mg Oral Once  . lisinopril  40 mg Oral Daily  . metoprolol succinate  25 mg Oral Daily  . potassium chloride  40 mEq Oral Once  . pregabalin  50 mg Oral BID  . venlafaxine XR  225 mg Oral Daily   Continuous Infusions: . 0.9 % NaCl with KCl 20 mEq / L 70 mL/hr at 10/21/15 1013     LOS: 4 days    Time spent: 25 minutes. Greater than 50% of this time was spent in direct contact with the patient coordinating care.     Lelon Frohlich, MD [facility] Pager [phone]  If 7PM-7AM, please contact night-coverage [email or web address] Password [facility] 10/21/2015, 4:25 PM

## 2015-10-22 NOTE — Progress Notes (Signed)
PROGRESS NOTE    Brittany Blair  [id number] DOB: 1960/12/03 DOA: 10/16/2015 PCP: No PCP Per Patient     Brief Narrative:  55 year old woman admitted to the hospital on 8/21 from home with complaints of increased confusion. 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 She continues to be confused. MRI negative but unable to exclude brain mets given lack of IV contrast. CT Head WWO negative.   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 -CT Head/MRI negative for CVA or metastatic disease. -Will request EEG to rule out seizure activity. -Will request neurology consultation when available on Monday. -Will DC acyclovir.  UTI -Completed 5 days of rocephin. Will DC abx today. -Urine cx was negative, as expected, as it was done after initiation of abx.  Hypokalemia -Recheck labs in am. -Mg ok at 1.9.  Hypertension -Fair control.  Cancer related pain -OxyIR, Flexeril and MS Contin have been held, Lyrica is currently at a lower dose.   DVT prophylaxis: Subcutaneous heparin Code Status: Full code Family Communication: Patient only Disposition Plan: SNF most likely Consultants:   Neurology when available  Procedures:   None  Antimicrobials:   Rocephin 8/22-->8/27   Subjective: Much more confused today.  Objective: Vitals:   10/21/15 0610 10/21/15 1407 10/21/15  2014 10/22/15 0530  BP: 138/86 (!) 134/93  138/79  Pulse: 89 (!) 111  86  Resp: 20 19  20   Temp: 98.7 F (37.1 C) 98.2 F (36.8 C)  98.9 F (37.2 C)  TempSrc: Oral Oral  Oral  SpO2: 99% 100% 99% 98%  Weight:      Height:        Intake/Output Summary (Last 24 hours) at 10/22/15 1108 Last data filed at 10/21/15 1500  Gross per 24 hour  Intake           155.17 ml  Output                0 ml  Net           155.17 ml   Filed Weights   10/16/15 1422 10/16/15 2218  Weight: 112 kg (247 lb) 102.9 kg (226 lb 13.7 oz)    Examination:  General exam: Very confused, not even oriented to person today. Respiratory system: Clear to auscultation. Respiratory effort normal. Cardiovascular system:RRR. No murmurs, rubs, gallops. Gastrointestinal system: Abdomen is nondistended, soft and nontender. No organomegaly or masses felt. Normal bowel sounds heard. Central nervous system: Confused, moves all 4 spontaneously Extremities: No C/C/E, +pedal pulses Skin: No rashes, lesions or ulcers Psychiatry: Unable to assess given current mental state    Data Reviewed: I have personally reviewed following labs and imaging studies  CBC:  Recent Labs Lab 10/16/15 1558 10/17/15 0735 10/18/15 0614 10/19/15 0845  WBC 9.1 9.8 10.2 11.8*  NEUTROABS 6.3  --   --   --   HGB 9.4* 10.7*  9.5* 10.7*  HCT 29.1* 33.4* 28.3* 31.6*  MCV 91.5 91.8 89.0 88.8  PLT 396 387 387 [id number]*   Basic Metabolic Panel:  Recent Labs Lab 10/16/15 1558 10/17/15 0735 10/18/15 0614 10/19/15 0845  NA 132* 136 132* 134*  K 4.3 3.4* 3.4* 3.3*  CL 93* 96* 99* 101  CO2 25 25 23 23   GLUCOSE 101* 124* 109* 92  BUN 26* 19 13 10   CREATININE 1.04* 0.82 0.67 0.70  CALCIUM 9.6 9.2 8.6* 8.4*  MG  --   --  1.9  --    GFR: Estimated Creatinine Clearance: 94.6 mL/min (by C-G formula based on SCr of 0.8 mg/dL). Liver Function Tests:  Recent Labs Lab 10/17/15 0735  AST 100*  ALT 71*  ALKPHOS 306*  BILITOT 0.6  PROT 8.2*    ALBUMIN 3.2*   No results for input(s): LIPASE, AMYLASE in the last 168 hours.  Recent Labs Lab 10/16/15 2153  AMMONIA 23   Coagulation Profile: No results for input(s): INR, PROTIME in the last 168 hours. Cardiac Enzymes: No results for input(s): CKTOTAL, CKMB, CKMBINDEX, TROPONINI in the last 168 hours. BNP (last 3 results) No results for input(s): PROBNP in the last 8760 hours. HbA1C: No results for input(s): HGBA1C in the last 72 hours. CBG:  Recent Labs Lab 10/20/15 0736 10/20/15 1134 10/20/15 1632 10/20/15 2040 10/21/15 0034  GLUCAP 99 118* 106* 120* 110*   Lipid Profile: No results for input(s): CHOL, HDL, LDLCALC, TRIG, CHOLHDL, LDLDIRECT in the last 72 hours. Thyroid Function Tests: No results for input(s): TSH, T4TOTAL, FREET4, T3FREE, THYROIDAB in the last 72 hours. Anemia Panel: No results for input(s): VITAMINB12, FOLATE, FERRITIN, TIBC, IRON, RETICCTPCT in the last 72 hours. Urine analysis:    Component Value Date/Time   COLORURINE YELLOW 10/16/2015 1840   APPEARANCEUR HAZY (A) 10/16/2015 1840   LABSPEC 1.020 10/16/2015 1840   PHURINE 5.5 10/16/2015 1840   GLUCOSEU NEGATIVE 10/16/2015 1840   HGBUR LARGE (A) 10/16/2015 1840   BILIRUBINUR NEGATIVE 10/16/2015 1840   KETONESUR 15 (A) 10/16/2015 1840   PROTEINUR 100 (A) 10/16/2015 1840   UROBILINOGEN 0.2 10/26/2013 1942   NITRITE NEGATIVE 10/16/2015 1840   LEUKOCYTESUR LARGE (A) 10/16/2015 1840   Sepsis Labs: @LABRCNTIP (procalcitonin:4,lacticidven:4)  ) Recent Results (from the past 240 hour(s))  Blood culture (routine x 2)     Status: None   Collection Time: 10/16/15  7:24 PM  Result Value Ref Range Status   Specimen Description BLOOD RIGHT HAND  Final   Special Requests BOTTLES DRAWN AEROBIC AND ANAEROBIC 6CC  Final   Culture NO GROWTH 5 DAYS  Final   Report Status 10/21/2015 FINAL  Final  Blood culture (routine x 2)     Status: None   Collection Time: 10/16/15  7:29 PM  Result Value Ref  Range Status   Specimen Description BLOOD RIGHT HAND  Final   Special Requests BOTTLES DRAWN AEROBIC ONLY [place]  Final   Culture NO GROWTH 5 DAYS  Final   Report Status 10/21/2015 FINAL  Final  MRSA PCR Screening     Status: None   Collection Time: 10/17/15  6:12 AM  Result Value Ref Range Status   MRSA by PCR NEGATIVE NEGATIVE Final    Comment:        The GeneXpert MRSA Assay (FDA approved for NASAL specimens only), is one component of a comprehensive MRSA colonization surveillance program. It is not intended to diagnose MRSA infection nor to guide or monitor treatment for  MRSA infections.   Culture, Urine     Status: None   Collection Time: 10/18/15  5:40 PM  Result Value Ref Range Status   Specimen Description URINE, CLEAN CATCH  Final   Special Requests NONE  Final   Culture NO GROWTH Performed at [facility]   Final   Report Status 10/20/2015 FINAL  Final         Radiology Studies: Ct Head W & Wo Contrast  Result Date: 10/20/2015 CLINICAL DATA:  Acute encephalopathy. History of metastatic breast cancer EXAM: CT HEAD WITHOUT AND WITH CONTRAST TECHNIQUE: Contiguous axial images were obtained from the base of the skull through the vertex without and with intravenous contrast CONTRAST:  75mL ISOVUE-300 IOPAMIDOL (ISOVUE-300) INJECTION 61% COMPARISON:  MRI head 10/19/2015.  CT head 10/16/2015 FINDINGS: Brain: Generalized atrophy. Negative for acute or chronic infarction. Negative for hemorrhage or mass. No edema. No enhancing lesion identified postcontrast administration. Vascular: No hyperdense vessel or unexpected calcification. Skull: No lytic or sclerotic metastatic disease to the skull. Sinuses/Orbits: Air-fluid level sphenoid sinus unchanged. Remaining sinuses clear. Other: Negative IMPRESSION: No acute intracranial abnormality.  Negative for metastatic disease. Air-fluid level sphenoid sinus. Electronically Signed   By: Franchot Gallo M.D.   On: 10/20/2015 19:39         Scheduled Meds: . heparin  5,000 Units Subcutaneous Q8H  . letrozole  2.5 mg Oral Daily  . lisinopril  20 mg Oral Once  . lisinopril  40 mg Oral Daily  . metoprolol succinate  25 mg Oral Daily  . pregabalin  50 mg Oral BID  . venlafaxine XR  225 mg Oral Daily   Continuous Infusions: . 0.9 % NaCl with KCl 20 mEq / L 70 mL/hr at 10/22/15 0058     LOS: 5 days    Time spent: 25 minutes. Greater than 50% of this time was spent in direct contact with the patient coordinating care.     Lelon Frohlich, MD [facility] Pager [phone]  If 7PM-7AM, please contact night-coverage [email or web address] Password [email or web address] 10/22/2015, 11:08 AM

## 2015-10-22 NOTE — Progress Notes (Addendum)
   10/22/15 1305  Vitals  Temp 97.8 F (36.6 C)  Temp Source Oral  BP (!) 162/103  BP Location Right Arm  BP Method Automatic  Patient Position (if appropriate) Lying  Pulse Rate (!) 111  Pulse Rate Source Dinamap  Resp 18  Oxygen Therapy  SpO2 99 %  O2 Device Room Air   Dr. Jerilee Hoh notified via text page of patient's VS.  Dr. Jerilee Hoh called back.  RN notified Dr. Jerilee Hoh also that patient's husband stated that patient was also on Amlodipine 5 mg at home.  No orders given for medication.  Dr. Jerilee Hoh also notified that EEG will be completed on Monday, October 23, 2015 per [facility].

## 2015-10-23 ENCOUNTER — Inpatient Hospital Stay (HOSPITAL_COMMUNITY)
Admit: 2015-10-23 | Discharge: 2015-10-23 | Disposition: A | Discharge status: Home / Self Care | Payer: Self-pay | Attending: Internal Medicine | Admitting: Internal Medicine

## 2015-10-23 NOTE — Progress Notes (Signed)
PROGRESS NOTE    Brittany Blair  [id number] DOB: 1960-08-17 DOA: 10/16/2015 PCP: No PCP Per Patient     Brief Narrative:  55 year old woman admitted to the hospital on 8/21 from home with complaints of increased confusion. 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 She continues to be confused. MRI negative but unable to exclude brain mets given lack of IV contrast. CT Head WWO negative. Still is very confused today.   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 -CT Head/MRI negative for CVA or metastatic disease. -EEG requested and pending. -Will request neurology consultation today. -Will DC acyclovir.  UTI -Completed 5 days of rocephin. Will DC abx today. -Urine cx was negative, as expected, as it was done after initiation of abx.  Hypokalemia -Recheck labs in am. -Mg ok at 1.9.  Hypertension -Fair control.  Cancer related pain -OxyIR, Flexeril and MS Contin have been held, Lyrica is currently at a lower dose.   DVT prophylaxis: Subcutaneous heparin Code Status: Full code Family Communication: Patient only Disposition Plan: SNF most likely Consultants:   Neurology when available  Procedures:   None  Antimicrobials:   Rocephin 8/22-->8/27   Subjective: Much more confused today.  Objective: Vitals:   10/22/15 1305 10/22/15 1619 10/22/15 2132  10/23/15 0607  BP: (!) 162/103 (!) 170/89 (!) 162/101 (!) 160/91  Pulse: (!) 111  (!) 112 89  Resp: 18  20 20   Temp: 97.8 F (36.6 C)  98.4 F (36.9 C) 98.1 F (36.7 C)  TempSrc: Oral  Oral Oral  SpO2: 99%  98% 100%  Weight:      Height:        Intake/Output Summary (Last 24 hours) at 10/23/15 1201 Last data filed at 10/23/15 0900  Gross per 24 hour  Intake             1510 ml  Output                0 ml  Net             1510 ml   Filed Weights   10/16/15 1422 10/16/15 2218  Weight: 112 kg (247 lb) 102.9 kg (226 lb 13.7 oz)    Examination:  General exam: Very confused, not even oriented to person today. Respiratory system: Clear to auscultation. Respiratory effort normal. Cardiovascular system:RRR. No murmurs, rubs, gallops. Gastrointestinal system: Abdomen is nondistended, soft and nontender. No organomegaly or masses felt. Normal bowel sounds heard. Central nervous system: Confused, moves all 4 spontaneously Extremities: 1+edema, +pedal pulses Skin: No rashes, lesions or ulcers Psychiatry: Unable to assess given current mental state    Data Reviewed: I have personally reviewed following labs and imaging studies  CBC:  Recent Labs Lab 10/16/15 1558 10/17/15 0735 10/18/15 0614 10/19/15 0845  WBC 9.1 9.8 10.2 11.8*  NEUTROABS 6.3  --   --   --  HGB 9.4* 10.7* 9.5* 10.7*  HCT 29.1* 33.4* 28.3* 31.6*  MCV 91.5 91.8 89.0 88.8  PLT 396 387 387 123456*   Basic Metabolic Panel:  Recent Labs Lab 10/16/15 1558 10/17/15 0735 10/18/15 0614 10/19/15 0845  NA 132* 136 132* 134*  K 4.3 3.4* 3.4* 3.3*  CL 93* 96* 99* 101  CO2 25 25 23 23   GLUCOSE 101* 124* 109* 92  BUN 26* 19 13 10   CREATININE 1.04* 0.82 0.67 0.70  CALCIUM 9.6 9.2 8.6* 8.4*  MG  --   --  1.9  --    GFR: Estimated Creatinine Clearance: 94.6 mL/min (by C-G formula based on SCr of 0.8 mg/dL). Liver Function Tests:  Recent Labs Lab 10/17/15 0735  AST 100*  ALT 71*  ALKPHOS 306*  BILITOT  0.6  PROT 8.2*  ALBUMIN 3.2*   No results for input(s): LIPASE, AMYLASE in the last 168 hours.  Recent Labs Lab 10/16/15 2153  AMMONIA 23   Coagulation Profile: No results for input(s): INR, PROTIME in the last 168 hours. Cardiac Enzymes: No results for input(s): CKTOTAL, CKMB, CKMBINDEX, TROPONINI in the last 168 hours. BNP (last 3 results) No results for input(s): PROBNP in the last 8760 hours. HbA1C: No results for input(s): HGBA1C in the last 72 hours. CBG:  Recent Labs Lab 10/20/15 0736 10/20/15 1134 10/20/15 1632 10/20/15 2040 10/21/15 0034  GLUCAP 99 118* 106* 120* 110*   Lipid Profile: No results for input(s): CHOL, HDL, LDLCALC, TRIG, CHOLHDL, LDLDIRECT in the last 72 hours. Thyroid Function Tests: No results for input(s): TSH, T4TOTAL, FREET4, T3FREE, THYROIDAB in the last 72 hours. Anemia Panel: No results for input(s): VITAMINB12, FOLATE, FERRITIN, TIBC, IRON, RETICCTPCT in the last 72 hours. Urine analysis:    Component Value Date/Time   COLORURINE YELLOW 10/16/2015 1840   APPEARANCEUR HAZY (A) 10/16/2015 1840   LABSPEC 1.020 10/16/2015 1840   PHURINE 5.5 10/16/2015 1840   GLUCOSEU NEGATIVE 10/16/2015 1840   HGBUR LARGE (A) 10/16/2015 1840   BILIRUBINUR NEGATIVE 10/16/2015 1840   KETONESUR 15 (A) 10/16/2015 1840   PROTEINUR 100 (A) 10/16/2015 1840   UROBILINOGEN 0.2 10/26/2013 1942   NITRITE NEGATIVE 10/16/2015 1840   LEUKOCYTESUR LARGE (A) 10/16/2015 1840   Sepsis Labs: @LABRCNTIP (procalcitonin:4,lacticidven:4)  ) Recent Results (from the past 240 hour(s))  Blood culture (routine x 2)     Status: None   Collection Time: 10/16/15  7:24 PM  Result Value Ref Range Status   Specimen Description BLOOD RIGHT HAND  Final   Special Requests BOTTLES DRAWN AEROBIC AND ANAEROBIC 6CC  Final   Culture NO GROWTH 5 DAYS  Final   Report Status 10/21/2015 FINAL  Final  Blood culture (routine x 2)     Status: None   Collection Time: 10/16/15  7:29 PM    Result Value Ref Range Status   Specimen Description BLOOD RIGHT HAND  Final   Special Requests BOTTLES DRAWN AEROBIC ONLY [place]  Final   Culture NO GROWTH 5 DAYS  Final   Report Status 10/21/2015 FINAL  Final  MRSA PCR Screening     Status: None   Collection Time: 10/17/15  6:12 AM  Result Value Ref Range Status   MRSA by PCR NEGATIVE NEGATIVE Final    Comment:        The GeneXpert MRSA Assay (FDA approved for NASAL specimens only), is one component of a comprehensive MRSA colonization surveillance program. It is not intended to diagnose MRSA infection nor to guide or  monitor treatment for MRSA infections.   Culture, Urine     Status: None   Collection Time: 10/18/15  5:40 PM  Result Value Ref Range Status   Specimen Description URINE, CLEAN CATCH  Final   Special Requests NONE  Final   Culture NO GROWTH Performed at [facility]   Final   Report Status 10/20/2015 FINAL  Final         Radiology Studies: No results found.      Scheduled Meds: . heparin  5,000 Units Subcutaneous Q8H  . letrozole  2.5 mg Oral Daily  . lisinopril  20 mg Oral Once  . lisinopril  40 mg Oral Daily  . metoprolol succinate  25 mg Oral Daily  . pregabalin  50 mg Oral BID  . venlafaxine XR  225 mg Oral Daily   Continuous Infusions: . 0.9 % NaCl with KCl 20 mEq / L 70 mL/hr at 10/23/15 0507     LOS: 6 days    Time spent: 25 minutes. Greater than 50% of this time was spent in direct contact with the patient coordinating care.     Lelon Frohlich, MD [facility] Pager [phone]  If 7PM-7AM, please contact night-coverage [email or web address] Password [email or web address] 10/23/2015, 12:01 PM

## 2015-10-23 NOTE — Clinical Social Work Note (Signed)
CSW spoke with [facility] Fouche who advised that he has had the tags removed from the vehicles in question.  He stated that he was awaiting the letter from the mail from [place] to complete, sign and send back. CSW inquired as to whether they had anyone in their ecology who could provide care for her.  Mr. Sunada advised that they may be able to get his daughter to stay for a week or so.    Caedmon Louque, Clydene Pugh, LCSW

## 2015-10-23 NOTE — Procedures (Signed)
[place] A. Merlene Laughter, MD     [email or web address]           HISTORY: The patient is a 55 year old female who presents with significant confusion and altered mental status.  MEDICATIONS: Scheduled Meds: . heparin  5,000 Units Subcutaneous Q8H  . letrozole  2.5 mg Oral Daily  . lisinopril  20 mg Oral Once  . lisinopril  40 mg Oral Daily  . metoprolol succinate  25 mg Oral Daily  . pregabalin  50 mg Oral BID  . venlafaxine XR  225 mg Oral Daily   Continuous Infusions: . 0.9 % NaCl with KCl 20 mEq / L 70 mL/hr at 10/23/15 0507   PRN Meds:.clotrimazole, naphazoline-glycerin, oxyCODONE, temazepam  Prior to Admission medications   Medication Sig Start Date End Date Taking? Authorizing Provider  acyclovir (ZOVIRAX) 400 MG tablet TAKE 1 TABLET BY MOUTH TWICE DAILY 06/05/15  Yes Baird Cancer, PA-C  ALPRAZolam Duanne Moron) 1 MG tablet TAKE 1 TABLET BY MOUTH 3 TIMES DAILY AS NEEDED FOR ANXIETY 05/12/15  Yes Baird Cancer, PA-C  clotrimazole (LOTRIMIN) 1 % cream Apply 1 application topically 2 (two) times daily as needed (yeast rash).   Yes Historical Provider, MD  cyclobenzaprine (FLEXERIL) 10 MG tablet TAKE 1 TABLET BY MOUTH 3 TIMES DAILY AS NEEDED FOR MUSCLE SPASMS 09/05/15  Yes Baird Cancer, PA-C  letrozole [facility]) 2.5 MG tablet Take 1 tablet (2.5 mg total) by mouth daily. 12/15/14  Yes Manon Hilding Kefalas, PA-C  lisinopril (PRINIVIL,ZESTRIL) 40 MG tablet TAKE 1 TABLET BY MOUTH ONCE DAILY 06/27/15  Yes Baird Cancer, PA-C  loratadine (CLARITIN) 10 MG tablet Take 10 mg by mouth daily as needed for allergies.    Yes Historical Provider, MD  morphine (MS CONTIN) 60 MG 12 hr tablet Take 2 tablet in AM, take 2 tablet in afternoon, take 2 tablets in evening 09/26/15  Yes Manon Hilding Kefalas, PA-C  OVER THE COUNTER MEDICATION Take 1 oz by mouth 2 (two) times daily. Liquid calcium with vitamin D   Yes Historical Provider, MD  Oxycodone HCl 10 MG TABS Take 1 or 2 tablets every 4  hours to control pain. 09/21/15  Yes Manon Hilding Kefalas, PA-C  polyethylene glycol (MIRALAX / GLYCOLAX) packet Take 8.5 g by mouth daily as needed for moderate constipation.   Yes Historical Provider, MD  pregabalin (LYRICA) 75 MG capsule Take two capsules in the morning and and 2 capsules at night 06/30/15  Yes Patrici Ranks, MD  temazepam (RESTORIL) 15 MG capsule TAKE 1 TO 2 CAPSULES BY MOUTH AT BEDTIME AS NEEDED FOR SLEEP 07/27/15  Yes Baird Cancer, PA-C  tetrahydrozoline-zinc (VISINE-AC) 0.05-0.25 % ophthalmic solution Place 2 drops into both eyes 3 (three) times daily as needed (for allergies).    Yes Historical Provider, MD  traZODone (DESYREL) 50 MG tablet TAKE 1 TABLET BY MOUTH AT BEDTIME. 05/18/15  Yes Patrici Ranks, MD  venlafaxine XR (EFFEXOR-XR) 75 MG 24 hr capsule TAKE 3 CAPSULES BY MOUTH DAILY 08/07/15  Yes Baird Cancer, PA-C      ANALYSIS: A 16 channel recording using standard 10 20 measurements is conducted for 22 minutes. The background activity gets as high as 8 Hz. There is significant myogenic artifact seen throughout the recording. Awake and drowsy activities are observed. Photic summation and hypoventilation are not carried out. There is no focal or lateral slowing. The patient is noted to have frequent frontal intermittent rhythmic delta activity more prominent on  the left side. She does have head movements without associated electrographic correlates. There is no epileptiform activity is observed.   IMPRESSION: This recording is abnormal for the following reasons: 1. Mild global slowing indicating a mild global encephalopathy. 2. Frequent frontal intermittent rhythmic delta activity which is typically seen in toxic metabolic encephalopathies.      Winry Egnew A. Merlene Laughter, M.D.  Diplomate, Tax adviser of Psychiatry and Neurology ( Neurology).

## 2015-10-23 NOTE — Progress Notes (Signed)
EEG Completed; Results Pending  

## 2015-10-23 NOTE — Consult Note (Signed)
[place] A. Merlene Laughter, MD     [email or web address]          Brittany Blair is an 55 y.o. female.   ASSESSMENT/PLAN: 1. Multifactorial toxic metabolic encephalopathies with etiologies including urinary tract infection and medication effect. Structural lesions are unlikely given the repeat imaging which has been unremarkable. EEG has been significant for changes seen in toxic metabolic encephalopathies but nothing specific for seizures. It may take the patient's 2-3 weeks before she returns to baseline. Metastatic disease is not seen on imaging although she could have microscopic spread but this would not explain the patient's current encephalopathy. Recommendation: Continue with medical support and appropriate antibiotics. Minimize psychotropic medications. I will go ahead and discontinue the Lyrica for now. I will also reduce the dose of Effexor.   The patient is a 55 year old white female who has stage IV metastatic breast cancer. The patient was taken to the hospital about a week ago because of increasing confusion. The family reports that she has been very confused although not particularly stuporous. She seems to be having nonsensical conversations about situations and people which are not around. She seems disoriented. It does not appear that recent changes to her medications have been made over the past 2 months. About 3-4 months ago there were some changes but she seemed to tolerated these changes well. It appears the Lyrica was increased but her antidepressant was reduced. The patient workup has been significant for urinary tract infection for which she has received IV antibiotics on the last 6 days. However, it appears that the confusion has persisted and in fact have gotten worse. On yesterday it appears that she was quite sleepy although reduced level of consciousness last drowsiness has not been a key character of her confusion. It appears the patient also had  generalized weakness resulting gait impairment. The family reports that at baseline she ambulates decently and is mostly cognitively intact.   GENERAL: This is an obese female who is quite confused but in no acute distress.  HEENT: Supple. Atraumatic normocephalic.   ABDOMEN: soft  EXTREMITIES: No edema   BACK: Normal.  SKIN: Normal by inspection.    MENTAL STATUS: She is awake but does not follow commands. She speaks in 2-3 word sentences are not comprehensible. Sometimes understandable but inappropriate and nonsensical.   CRANIAL NERVES: Pupils are equal, round and reactive to light; extra ocular movements are full, there is no significant nystagmus; visual fields are full - although limited; upper and lower facial muscles are normal in strength and symmetric, there is no flattening of the nasolabial folds.  MOTOR: This is quite limited due to lack of cooperation and not following commands consistently. She has antigravity strength in the upper extremities. Bulk and tone are normal. Leg strength seems to be about 2/3 at least.  COORDINATION: The patient is noted to have occasional jerky myoclonic type movements in the upper and lower extremities. No dysmetria or tremors are noted.  REFLEXES: Deep tendon reflexes are symmetrical and normal. Babinski reflexes are flexor bilaterally.   SENSATION: Unreliable.   Blood pressure (!) 159/98, pulse (!) 108, temperature 98.6 F (37 C), temperature source Oral, resp. rate 18, height 5' 5"  (1.651 m), weight 226 lb 13.7 oz (102.9 kg), last menstrual period 05/29/2013, SpO2 98 %.  Past Medical History:  Diagnosis Date  . Anxiety   . B12 deficiency 05/12/2015  . Breast cancer ([place]) dx'd 05/27/2010   chemo/xrt comp 01/2011  . Breast lump    left  .  Carcinoma of breast metastatic to bone ([place]) 05/28/2010   This patient presented in April with an invasive ductal carcinoma that had broken through the skin and was contaminated with MRSA. She was  stage III. It was lower inner quadrant. It was receptor positive, HER-2/neu negative, and had a KI-67 of 75%.  She was treated with neoadjuvant chemotherapy. She was recently hospitalized (June, 2012,) with multiple superficial MRSA infections. These have all r  . Depression   . Genital herpes 08/04/2012  . Hematoma   . Herpes   . Hypertension   . Lymph edema   . MRSA (methicillin resistant Staphylococcus aureus)   . Multiple blisters    along surgical site  . PMB (postmenopausal bleeding) 06/23/2013  . Swelling    left foot  . Vaginal delivery 1981, 1984, 1987, 1992    Past Surgical History:  Procedure Laterality Date  . BONE BIOPSY Left 11/2013   left post. hip  . CYSTOSCOPY N/A 10/14/2013   Procedure: CYSTOSCOPY;  Surgeon: Ena Dawley, MD;  Location: [facility];  Service: Gynecology;  Laterality: N/A;  . ESOPHAGOGASTRODUODENOSCOPY N/A 10/27/2013   Procedure: ESOPHAGOGASTRODUODENOSCOPY (EGD);  Surgeon: Wonda Horner, MD;  Location: [facility];  Service: Endoscopy;  Laterality: N/A;  . LAPAROSCOPIC ASSISTED VAGINAL HYSTERECTOMY Bilateral 10/14/2013   Procedure: LAPAROSCOPIC ASSISTED VAGINAL HYSTERECTOMY;  Surgeon: Ena Dawley, MD;  Location: [facility];  Service: Gynecology;  Laterality: Bilateral;  . LAPAROSCOPIC BILATERAL SALPINGO OOPHERECTOMY Bilateral 10/14/2013   Procedure: LAPAROSCOPIC BILATERAL SALPINGO OOPHORECTOMY;  Surgeon: Ena Dawley, MD;  Location: [facility];  Service: Gynecology;  Laterality: Bilateral;  . LAPAROSCOPIC LYSIS OF ADHESIONS N/A 10/14/2013   Procedure: LAPAROSCOPIC LYSIS OF ADHESIONS;  Surgeon: Ena Dawley, MD;  Location: [facility];  Service: Gynecology;  Laterality: N/A;  . MASTECTOMY MODIFIED RADICAL  10/10/10    left -Dr Margot Chimes  . TUBAL LIGATION    . WISDOM TOOTH EXTRACTION      Family History  Problem Relation Age of Onset  . Heart disease Father     heart attack  . Hypertension Daughter   . Hypertension Son   . Cancer Maternal Grandmother      ovarian  . Hypertension Maternal Grandfather   . Cancer Paternal Grandmother     ovarian  . Hypertension Paternal Grandfather     Social History:  reports that she has been smoking Cigarettes.  She has been smoking about 0.35 packs per day. She has never used smokeless tobacco. She reports that she drinks about 12.6 oz of alcohol per week . She reports that she does not use drugs.  Allergies:  Allergies  Allergen Reactions  . Bee Venom Anaphylaxis    Medications: Prior to Admission medications   Medication Sig Start Date End Date Taking? Authorizing Provider  acyclovir (ZOVIRAX) 400 MG tablet TAKE 1 TABLET BY MOUTH TWICE DAILY 06/05/15  Yes Baird Cancer, PA-C  ALPRAZolam Duanne Moron) 1 MG tablet TAKE 1 TABLET BY MOUTH 3 TIMES DAILY AS NEEDED FOR ANXIETY 05/12/15  Yes Baird Cancer, PA-C  clotrimazole (LOTRIMIN) 1 % cream Apply 1 application topically 2 (two) times daily as needed (yeast rash).   Yes Historical Provider, MD  cyclobenzaprine (FLEXERIL) 10 MG tablet TAKE 1 TABLET BY MOUTH 3 TIMES DAILY AS NEEDED FOR MUSCLE SPASMS 09/05/15  Yes Baird Cancer, PA-C  letrozole [facility]) 2.5 MG tablet Take 1 tablet (2.5 mg total) by mouth daily. 12/15/14  Yes Manon Hilding Kefalas, PA-C  lisinopril (PRINIVIL,ZESTRIL) 40 MG tablet TAKE 1 TABLET  BY MOUTH ONCE DAILY 06/27/15  Yes Baird Cancer, PA-C  loratadine (CLARITIN) 10 MG tablet Take 10 mg by mouth daily as needed for allergies.    Yes Historical Provider, MD  morphine (MS CONTIN) 60 MG 12 hr tablet Take 2 tablet in AM, take 2 tablet in afternoon, take 2 tablets in evening 09/26/15  Yes Manon Hilding Kefalas, PA-C  OVER THE COUNTER MEDICATION Take 1 oz by mouth 2 (two) times daily. Liquid calcium with vitamin D   Yes Historical Provider, MD  Oxycodone HCl 10 MG TABS Take 1 or 2 tablets every 4 hours to control pain. 09/21/15  Yes Manon Hilding Kefalas, PA-C  polyethylene glycol (MIRALAX / GLYCOLAX) packet Take 8.5 g by mouth daily as needed for moderate  constipation.   Yes Historical Provider, MD  pregabalin (LYRICA) 75 MG capsule Take two capsules in the morning and and 2 capsules at night 06/30/15  Yes Patrici Ranks, MD  temazepam (RESTORIL) 15 MG capsule TAKE 1 TO 2 CAPSULES BY MOUTH AT BEDTIME AS NEEDED FOR SLEEP 07/27/15  Yes Baird Cancer, PA-C  tetrahydrozoline-zinc (VISINE-AC) 0.05-0.25 % ophthalmic solution Place 2 drops into both eyes 3 (three) times daily as needed (for allergies).    Yes Historical Provider, MD  traZODone (DESYREL) 50 MG tablet TAKE 1 TABLET BY MOUTH AT BEDTIME. 05/18/15  Yes Patrici Ranks, MD  venlafaxine XR (EFFEXOR-XR) 75 MG 24 hr capsule TAKE 3 CAPSULES BY MOUTH DAILY 08/07/15  Yes Baird Cancer, PA-C    Scheduled Meds: . heparin  5,000 Units Subcutaneous Q8H  . letrozole  2.5 mg Oral Daily  . lisinopril  20 mg Oral Once  . lisinopril  40 mg Oral Daily  . metoprolol succinate  25 mg Oral Daily  . pregabalin  50 mg Oral BID  . venlafaxine XR  225 mg Oral Daily   Continuous Infusions: . 0.9 % NaCl with KCl 20 mEq / L 70 mL/hr at 10/23/15 0507   PRN Meds:.clotrimazole, naphazoline-glycerin, oxyCODONE, temazepam     No results found for this or any previous visit (from the past 48 hour(s)).  Studies/Results:   BRAIN MRI WO 1. No acute intracranial abnormality. Early metastatic disease to the brain is difficult to exclude in the absence of intravenous contrast. 2. Heterogeneous bone marrow signal which is nonspecific. No destructive osseous lesion identified. 3. Fluid level in the right sphenoid sinus suggesting sinusitis in the appropriate clinical setting.     HEAD CT  W  W/O FINDINGS: Brain: Generalized atrophy. Negative for acute or chronic infarction. Negative for hemorrhage or mass. No edema.  No enhancing lesion identified postcontrast administration.  Vascular: No hyperdense vessel or unexpected calcification.  Skull: No lytic or sclerotic metastatic disease to the  skull.  Sinuses/Orbits: Air-fluid level sphenoid sinus unchanged. Remaining sinuses clear.  Other: Negative  IMPRESSION: No acute intracranial abnormality.  Negative for metastatic disease.  Air-fluid level sphenoid sinus.    LABS TSH 2.4; RPR NR; ammonia 23; and vitamin B12 5965     Danon Lograsso A. Merlene Laughter, M.D.  Diplomate, Tax adviser of Psychiatry and Neurology ( Neurology). 10/23/2015, 6:27 PM

## 2015-10-24 LAB — CBC
HEMATOCRIT: 35 % — AB (ref 36.0–46.0)
Hemoglobin: 12 g/dL (ref 12.0–15.0)
MCH: 29.9 pg (ref 26.0–34.0)
MCHC: 34.3 g/dL (ref 30.0–36.0)
MCV: 87.1 fL (ref 78.0–100.0)
PLATELETS: 516 10*3/uL — AB (ref 150–400)
RBC: 4.02 MIL/uL (ref 3.87–5.11)
RDW: 14.6 % (ref 11.5–15.5)
WBC: 15.4 10*3/uL — ABNORMAL HIGH (ref 4.0–10.5)

## 2015-10-24 LAB — BASIC METABOLIC PANEL
Anion gap: 12 (ref 5–15)
BUN: 10 mg/dL (ref 6–20)
CHLORIDE: 106 mmol/L (ref 101–111)
CO2: 20 mmol/L — AB (ref 22–32)
CREATININE: 0.44 mg/dL (ref 0.44–1.00)
Calcium: 8.2 mg/dL — ABNORMAL LOW (ref 8.9–10.3)
GFR calc Af Amer: >60 mL/min (ref >60)
GFR calc non Af Amer: >60 mL/min (ref >60)
Glucose, Bld: 110 mg/dL — ABNORMAL HIGH (ref 65–99)
Potassium: 3.5 mmol/L (ref 3.5–5.1)
Sodium: 138 mmol/L (ref 135–145)

## 2015-10-24 MED ORDER — VENLAFAXINE HCL ER 75 MG PO CP24: 75.0000 mg | ORAL_CAPSULE | Freq: Every day | ORAL | Status: DC

## 2015-10-24 NOTE — Progress Notes (Signed)
Physical Therapy Treatment Patient Details Name: Brittany Blair MRN: [id number] DOB: 07-01-1960 Today's Date: 10/24/2015    History of Present Illness Brittany Blair is a 55 y.o. 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 Her entire history is provided by her husband. He states that she can usually walk with some assistance but her walking has been in rapid decline over the last few days.  She also was noted to have trouble with her thought process.  There has been no new medication, HA, fever, chills, or any ill contact.  Dx: acute encephalopathy due to pain meds and psychotropic medication side effects, or UTI.  Anxiety, B12 deficiency, carcinoma of breast metastatic to bone with multiple supervision MRSA infections, genital herpes, hematoma, HTN, lymph edema, L foot swelling, L posterior hip bone biopsy, L mastectomy    PT Comments    Pt received in bed, and was agreeable to participate in PT tx.  Pt continues to have paranoid hallucinations where she believes someone is trying to get her.  She requires Mod/Max A for all functional mobility, and was able to transfer to the [facility], and back to bed today.  Continue to recommend SNF, however due to pt not having insurance at this point, this may not be a possibility, therefore would recommend home with HHPT, and 24/7 supervision/assistance.     Follow Up Recommendations  SNF;Home health PT;Supervision/Assistance - 24 hour     Equipment Recommendations  Rolling walker with 5" wheels;Wheelchair (measurements PT);3in1 (PT)    Recommendations for Other Services       Precautions / Restrictions Precautions Precautions: Fall Precaution Comments: Due to AMS, and impaired mobility.  Restrictions Weight Bearing  Restrictions: No    Mobility  Bed Mobility   Bed Mobility: Supine to Sit     Supine to sit: Mod assist;HOB elevated Sit to supine: Min assist      Transfers Overall transfer level: Needs assistance Equipment used: Rolling walker (2 wheeled) Transfers: Sit to/from [employer] Sit to Stand: Mod assist (due to fear from paranoia - pt thinks there are other people in the room who are trying to get her. ) Stand pivot transfers: Max assist (Over to the [facility], but no successful BM. )       General transfer comment: continued poor initiation of movement.   Ambulation/Gait                 Stairs            Wheelchair Mobility    Modified Rankin (Stroke Patients Only)       Balance Overall balance assessment: Needs assistance Sitting-balance support: Bilateral upper extremity supported Sitting balance-Leahy Scale: Fair Sitting balance - Comments: Pt leaning back and to the left at times, but then sits back up.    Standing balance support: Bilateral upper extremity supported Standing balance-Leahy Scale: Poor                      Cognition Arousal/Alertness: Awake/alert Behavior During Therapy:  (paranoid) Overall Cognitive Status: No family/caregiver present to determine baseline cognitive functioning       Memory: Decreased short-term memory              Exercises General Exercises - Lower Extremity  Ankle Circles/Pumps: AROM;Both;10 reps;Supine Heel Slides: Strengthening;10 reps;Supine;Limitations Heel Slides Limitations: pt requires vc's to stay on track and audible counting to continue with exercise.  Straight Leg Raises: AROM;Both;10 reps;Supine    General Comments        Pertinent Vitals/Pain Pain Assessment: No/denies pain    Home Living                      Prior Function            PT Goals (current goals can now be found in the care plan section) Acute Rehab PT Goals PT Goal Formulation:  Patient unable to participate in goal setting Time For Goal Achievement: 10/24/15 Potential to Achieve Goals: Fair Progress towards PT goals: Not progressing toward goals - comment    Frequency  Min 5X/week    PT Plan Current plan remains appropriate    Co-evaluation             End of Session Equipment Utilized During Treatment: Gait belt Activity Tolerance: Patient tolerated treatment well Patient left: in bed     Time: 1135-1200 PT Time Calculation (min) (ACUTE ONLY): 25 min  Charges:  $Therapeutic Exercise: 8-22 mins $Therapeutic Activity: 8-22 mins                    G Codes:      Beth Selin Eisler, PT, DPT X: [phone]

## 2015-10-24 NOTE — Discharge Summary (Addendum)
Physician Discharge Summary  Brittany Blair [id number] DOB: November 20, 1960 DOA: 10/16/2015  PCP: No PCP Per Patient  Admit date: 10/16/2015 Discharge date: 10/24/2015  Time spent: 45 minutes  Recommendations for Outpatient Follow-up:  -Will be discharged home today. -Will need follow up with PCP in 2 weeks.   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 Marin Comment on 8/21: Brittany Blair is a 55 y.o. 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 Her entire history is provided by her husband. He states that she can usually walk with some assistance but her walking has been in rapid decline over the last few days.  She also was noted to have trouble with her thought process.  There has been no new medication, HA, fever, chills, or any ill contact.    ED Course: While in ED, Sodium 132, BUN 26, Cr 1.04, Hgb, 9.4, Glucose 101, UA is indicative of UTI with TNTC WBCs. EKG showed sinus tachycardia. CT head shows no evidence of acute intracranial abnormality. CT head also shows mild atrophy with small vessel ischemic changes. She will be admitted for treatment of her UTI.  Hospital Course:   Acute on chronic encephalopathy -Per family remains way off her baseline mental status, initially suspected acute  component due to UTI and also possibly narcotic effect; however, no improvement with treatment of UTI and cessation of sedating meds. -CT Head/MRI negative for CVA or metastatic disease. -EEG without seizure activity. -Per neurology: . Multifactorial toxic metabolic encephalopathies with etiologies including urinary tract infection and medication effect. Structural lesions are unlikely given the repeat imaging which has been unremarkable. EEG has been significant for changes seen in toxic metabolic encephalopathies but nothing specific for seizures. It may take the patient's 2-3 weeks before she returns to baseline. Metastatic disease is not seen on imaging although she could have microscopic spread but this would not explain the patient's current encephalopathy. Recommendation: Continue with medical support and appropriate antibiotics. Minimize psychotropic medications. I will go ahead and discontinue the Lyrica for now. I will also reduce the dose of Effexor  UTI -Completed 5 days of rocephin.  -Urine cx was negative, as expected, as it was done after initiation of abx.  Hypokalemia -Replaced -Mg ok at 1.9.  Hypertension -Fair control.  Cancer related pain -OxyIR, Flexeril and MS Contin, lyrica have been held given her acute encephalopathy.   Procedures: EEG: IMPRESSION: This recording is abnormal for the following reasons: 1. Mild global slowing indicating a mild global encephalopathy. 2. Frequent frontal intermittent rhythmic delta activity which is typically seen in toxic metabolic encephalopathies.       Consultations:  Neurology  Discharge Instructions  Discharge Instructions    Diet - low sodium heart healthy    Complete by:  As  directed   Increase activity slowly    Complete by:  As directed       Medication List    STOP taking these medications   acyclovir 400 MG tablet Commonly known as:  ZOVIRAX   ALPRAZolam 1 MG tablet Commonly known as:  XANAX    cyclobenzaprine 10 MG tablet Commonly known as:  FLEXERIL   loratadine 10 MG tablet Commonly known as:  CLARITIN   morphine 60 MG 12 hr tablet Commonly known as:  MS CONTIN   OVER THE COUNTER MEDICATION   Oxycodone HCl 10 MG Tabs   polyethylene glycol packet Commonly known as:  MIRALAX / GLYCOLAX   pregabalin 75 MG capsule Commonly known as:  LYRICA   traZODone 50 MG tablet Commonly known as:  DESYREL     TAKE these medications   clotrimazole 1 % cream Commonly known as:  LOTRIMIN Apply 1 application topically 2 (two) times daily as needed (yeast rash).   letrozole 2.5 MG tablet Commonly known as:  FEMARA Take 1 tablet (2.5 mg total) by mouth daily.   lisinopril 40 MG tablet Commonly known as:  PRINIVIL,ZESTRIL TAKE 1 TABLET BY MOUTH ONCE DAILY   temazepam 15 MG capsule Commonly known as:  RESTORIL TAKE 1 TO 2 CAPSULES BY MOUTH AT BEDTIME AS NEEDED FOR SLEEP   tetrahydrozoline-zinc 0.05-0.25 % ophthalmic solution Commonly known as:  VISINE-AC Place 2 drops into both eyes 3 (three) times daily as needed (for allergies).   venlafaxine XR 75 MG 24 hr capsule Commonly known as:  EFFEXOR-XR TAKE 3 CAPSULES BY MOUTH DAILY What changed:  Another medication with the same name was added. Make sure you understand how and when to take each.   venlafaxine XR 75 MG 24 hr capsule Commonly known as:  EFFEXOR-XR Take 1 capsule (75 mg total) by mouth daily. Start taking on:  10/25/2015 What changed:  You were already taking a medication with the same name, and this prescription was added. Make sure you understand how and when to take each.      Allergies  Allergen Reactions  . Bee Venom Anaphylaxis   Follow-up Information    with your regular physician. Schedule an appointment as soon as possible for a visit in 2 week(s).            The results of significant diagnostics from this hospitalization (including imaging, microbiology, ancillary and laboratory) are  listed below for reference.    Significant Diagnostic Studies: Ct Head Wo Contrast  Result Date: 10/16/2015 CLINICAL DATA:  Metastatic breast cancer, confusion, decreased appetite x 3-4 days EXAM: CT HEAD WITHOUT CONTRAST TECHNIQUE: Contiguous axial images were obtained from the base of the skull through the vertex without intravenous contrast. COMPARISON:  02/16/2014 FINDINGS: Brain: No evidence of acute infarction, hemorrhage, hydrocephalus, extra-axial collection or mass lesion/mass effect. Vascular: No hyperdense vessel or unexpected calcification. Skull: No evidence of calvarial fracture. Sinuses/Orbits: Layering fluid in the left sphenoid sinus. Visualized mastoid air cells are clear. Other: Mild subcortical white matter and periventricular small vessel ischemic changes. Mild cortical atrophy. No ventriculomegaly. IMPRESSION: No evidence of acute intracranial abnormality. Mild atrophy with small vessel ischemic changes. Electronically Signed   By: Julian Hy M.D.   On: 10/16/2015 18:38   Ct Head W & Wo Contrast  Result Date: 10/20/2015 CLINICAL DATA:  Acute encephalopathy. History of metastatic breast cancer EXAM: CT HEAD WITHOUT AND WITH CONTRAST TECHNIQUE: Contiguous axial images were obtained from the base of the skull through the  vertex without and with intravenous contrast CONTRAST:  40mL ISOVUE-300 IOPAMIDOL (ISOVUE-300) INJECTION 61% COMPARISON:  MRI head 10/19/2015.  CT head 10/16/2015 FINDINGS: Brain: Generalized atrophy. Negative for acute or chronic infarction. Negative for hemorrhage or mass. No edema. No enhancing lesion identified postcontrast administration. Vascular: No hyperdense vessel or unexpected calcification. Skull: No lytic or sclerotic metastatic disease to the skull. Sinuses/Orbits: Air-fluid level sphenoid sinus unchanged. Remaining sinuses clear. Other: Negative IMPRESSION: No acute intracranial abnormality.  Negative for metastatic disease. Air-fluid level sphenoid  sinus. Electronically Signed   By: Franchot Gallo M.D.   On: 10/20/2015 19:39   Mr Brain Wo Contrast  Result Date: 10/19/2015 CLINICAL DATA:  55 year old female with increased confusion. Stage IV breast cancer. Subsequent encounter. EXAM: MRI HEAD WITHOUT CONTRAST TECHNIQUE: Multiplanar, multiecho pulse sequences of the brain and surrounding structures were obtained without intravenous contrast. COMPARISON:  Head CT without contrast 10/16/2015 and earlier. FINDINGS: Study is mildly degraded by motion artifact despite repeated imaging attempts. Major intracranial vascular flow voids are preserved. No restricted diffusion to suggest acute infarction. No midline shift, mass effect, evidence of mass lesion, ventriculomegaly, extra-axial collection or acute intracranial hemorrhage. Cervicomedullary junction and pituitary are within normal limits. No encephalomalacia. No chronic cerebral blood products. No contrast was administered, but no areas of cerebral edema are evident noncontrast gray and white matter signal is normal for age. Mildly heterogeneous bone marrow signal in the calvarium and upper cervical spine, but no destructive osseous lesion identified. Fluid level in the sphenoid sinus. Other paranasal sinuses and mastoids are well pneumatized. Visible internal auditory structures appear normal. Negative orbit and scalp soft tissues. IMPRESSION: 1. No acute intracranial abnormality. Early metastatic disease to the brain is difficult to exclude in the absence of intravenous contrast. 2. Heterogeneous bone marrow signal which is nonspecific. No destructive osseous lesion identified. 3. Fluid level in the right sphenoid sinus suggesting sinusitis in the appropriate clinical setting. Electronically Signed   By: Genevie Ann M.D.   On: 10/19/2015 12:32   Dg Chest Portable 1 View  Result Date: 10/16/2015 CLINICAL DATA:  Cancer metastatic to bone, right-sided hip pain. Confusion. History of left breast cancer. EXAM:  PORTABLE CHEST 1 VIEW COMPARISON:  None. FINDINGS: Study is hypoinspiratory. Given the low lung volumes, in the slight patient obliquity, lungs appear clear. No evidence of pneumonia. No pleural effusion or pneumothorax seen. Heart size is upper normal, likely accentuated by the low lung volumes. Surgical clips are seen about the left axilla. Old healed rib fractures on the left were better seen on earlier CT. No evidence of acute osseous abnormality seen on today's chest x-ray. IMPRESSION: Low lung volumes.  No active disease seen. Electronically Signed   By: Franki Cabot M.D.   On: 10/16/2015 19:50    Microbiology: Recent Results (from the past 240 hour(s))  Blood culture (routine x 2)     Status: None   Collection Time: 10/16/15  7:24 PM  Result Value Ref Range Status   Specimen Description BLOOD RIGHT HAND  Final   Special Requests BOTTLES DRAWN AEROBIC AND ANAEROBIC 6CC  Final   Culture NO GROWTH 5 DAYS  Final   Report Status 10/21/2015 FINAL  Final  Blood culture (routine x 2)     Status: None   Collection Time: 10/16/15  7:29 PM  Result Value Ref Range Status   Specimen Description BLOOD RIGHT HAND  Final   Special Requests BOTTLES DRAWN AEROBIC ONLY [place]  Final   Culture NO GROWTH  5 DAYS  Final   Report Status 10/21/2015 FINAL  Final  MRSA PCR Screening     Status: None   Collection Time: 10/17/15  6:12 AM  Result Value Ref Range Status   MRSA by PCR NEGATIVE NEGATIVE Final    Comment:        The GeneXpert MRSA Assay (FDA approved for NASAL specimens only), is one component of a comprehensive MRSA colonization surveillance program. It is not intended to diagnose MRSA infection nor to guide or monitor treatment for MRSA infections.   Culture, Urine     Status: None   Collection Time: 10/18/15  5:40 PM  Result Value Ref Range Status   Specimen Description URINE, CLEAN CATCH  Final   Special Requests NONE  Final   Culture NO GROWTH Performed at [facility]    Final   Report Status 10/20/2015 FINAL  Final     Labs: Basic Metabolic Panel:  Recent Labs Lab 10/18/15 0614 10/19/15 0845 10/24/15 0618  NA 132* 134* 138  K 3.4* 3.3* 3.5  CL 99* 101 106  CO2 23 23 20*  GLUCOSE 109* 92 110*  BUN 13 10 10   CREATININE 0.67 0.70 0.44  CALCIUM 8.6* 8.4* 8.2*  MG 1.9  --   --    Liver Function Tests: No results for input(s): AST, ALT, ALKPHOS, BILITOT, PROT, ALBUMIN in the last 168 hours. No results for input(s): LIPASE, AMYLASE in the last 168 hours. No results for input(s): AMMONIA in the last 168 hours. CBC:  Recent Labs Lab 10/18/15 0614 10/19/15 0845 10/24/15 0618  WBC 10.2 11.8* 15.4*  HGB 9.5* 10.7* 12.0  HCT 28.3* 31.6* 35.0*  MCV 89.0 88.8 87.1  PLT 387 432* 516*   Cardiac Enzymes: No results for input(s): CKTOTAL, CKMB, CKMBINDEX, TROPONINI in the last 168 hours. BNP: BNP (last 3 results) No results for input(s): BNP in the last 8760 hours.  ProBNP (last 3 results) No results for input(s): PROBNP in the last 8760 hours.  CBG:  Recent Labs Lab 10/20/15 0736 10/20/15 1134 10/20/15 1632 10/20/15 2040 10/21/15 0034  GLUCAP 99 118* 106* 120* 110*       Signed:  HERNANDEZ ACOSTA,ESTELA  [facility] Pager: [phone] 10/24/2015, 5:38 PM

## 2015-10-24 NOTE — Progress Notes (Signed)
Initial Nutrition Assessment  DOCUMENTATION CODES:  Obesity unspecified   LIkely meets criteria for malnutrition in acute illness, but unable to diagnosis with lack of recent weight documentation/trend  INTERVENTION:  Neurology had anticipated it may take 2-3 weeks to return to baseline, her current PO intake is estimated to be meeting <50% of nutritional needs   It is in pt's best interest to avoid prolonged poor PO intake.  If she is expected to be unable to meet >50% nutritional needs in next 2-4 days, She would best benefit from short term enteral nutrition via NGT until her encephalopathy resolves..   Will f/u tomorrow and continue to monitor for improvement for intake.  Magic cup TID with meals, each supplement provides 290 kcal and 9 grams of protein  NUTRITION DIAGNOSIS:  Inadequate oral intake related to lethargy/confusion as evidenced by meal completion < 25%.  GOAL:  Patient will meet greater than or equal to 90% of their needs  MONITOR:  PO intake, Supplement acceptance, Labs, I & O's  REASON FOR ASSESSMENT:  Malnutrition Screening Tool    ASSESSMENT:  55 y/o female PMHx stage 4 breast cancer w/ bone metastases, anxiety, depression, HTN. She initially had presented with increased weakness and confusion. She was worked up for UTI  Pt admitted x 1 week and confusion and per multiple staff members, he confusion has gotten worse in last few days. Neurology consulted-toxic metabolic encephalopathy-per MD note, may take 2-3 weeks before returns to baseline.   Per meal documentation, their are wide variations in intake. Her po intake has worsened since admit. Her first 2 days she ate 25-50%. Since 8/24, including the cna verbal report this morning, there have been 9 meals documented. 2 meals have been >50%, the rest of have been <20% consumed.   Conversations with staff confirm poor po intake, Nurse tech who fed this morning only reported "bites". Dietary ambassador stated  she has been bringing patient meals for the last 5-6 days and patient rarely eats much, she was also told the husband may be eating parts of the trays.    Weight wise, she appears to had been ~245-250 lbs  this year. She has been gaining weight at constant rate since 2012 when she was 135 lbs. Her gain has accelerated over last 18 months  The first weight from this admission was 247 lbs, but this was thought to have been carried from the prior encounter 3 months ago. There is no wt hx since June. This is still a significant loss of 8% x 3 months  Today, when RD asked questions, her answers were largely inappropriate: "what is going on around here?!" and went on to talk about her grandfather RD tried to offer foods that she would accept, but she mostly did not respond.   Medications: Femara Labs reviewed:WBC:15.4, trend up   Recent Labs Lab 10/18/15 0614 10/19/15 0845 10/24/15 0618  NA 132* 134* 138  K 3.4* 3.3* 3.5  CL 99* 101 106  CO2 23 23 20*  BUN 13 10 10   CREATININE 0.67 0.70 0.44  CALCIUM 8.6* 8.4* 8.2*  MG 1.9  --   --   GLUCOSE 109* 92 110*    Diet Order:  Diet regular Room service appropriate? Yes; Fluid consistency: Thin  Skin: MSAD buttocks, .5 cm wound to R buttocks, excoriated skin to groin  Last BM:  8/29  Height:  Ht Readings from Last 1 Encounters:  10/16/15 5\' 5"  (1.651 m)   Weight:  Wt Readings  from Last 1 Encounters:  10/16/15 226 lb 13.7 oz (102.9 kg)   Wt Readings from Last 10 Encounters:  10/16/15 226 lb 13.7 oz (102.9 kg)  07/14/15 247 lb 14.4 oz (112.4 kg)  06/30/15 249 lb (112.9 kg)  05/12/15 250 lb 6.4 oz (113.6 kg)  04/07/15 245 lb (111.1 kg)  11/18/14 226 lb 3.2 oz (102.6 kg)  10/07/14 217 lb 8 oz (98.7 kg)  09/08/14 212 lb 4.8 oz (96.3 kg)  07/08/14 191 lb 11.2 oz (87 kg)  06/10/14 183 lb 14.4 oz (83.4 kg)   Ideal Body Weight:  56.82 kg  BMI:  Body mass index is 37.75 kg/m.  Estimated Nutritional Needs:  Kcal:  1650-1850 (16-18  kcal/kg bw) Protein:  75-85 g (1.3-1.5 g/kg ibw) Fluid:  >1.8 liters  EDUCATION NEEDS:  No education needs identified at this time  Burtis Junes RD, LDN, [place] [facility] Pager: [phone] 10/24/2015 2:53 PM

## 2015-10-24 NOTE — Progress Notes (Signed)
Patient with orders to be discharge home. Discharge instructions given to patient's spouse, verbalized understanding. Patient stable. Patient left in private vehicle with family.

## 2015-10-24 NOTE — Care Management Note (Signed)
Case Management Note  Patient Details  Name: Brittany Blair MRN: [id number] Date of Birth: 1960-09-25    Expected Discharge Date:    10/24/2015              Expected Discharge Plan:  Home/Self Care  In-House Referral:  Clinical Social Work, Scientist, research (medical)  CM Consult  If discussed at [employer], dates discussed:  10/24/2015  Additional Comments: Spoke with [place] Demonte, spouse, again today regarding care of his wife at discharge. Patient was recommended by PT to go to SNF or have [place] and 24 hour supervision at home. Mr. Kindig has been talking with Development worker, community, CSW and CM to devise a plan for Mrs. Birch who is still with [facility]. Mr. Bergan is in process of applying for Medicaid, he knows that he needs to fill out some paperwork and have some "old cars removed from his property". He has failed to do those things at this time. He has been offered Home Health but he would have to pay for this privately as they do not qualify for charity [facility]. He does not wish to do this. Mr. Tovar made aware that patient must have 24 hour supervision. He agrees that he will do his best to have someone with his wife at all times but this "will be hard". Patient to be discharged today into care of family. Will sign off.  Antrell Tipler, Chauncey Reading, RN 10/24/2015, 2:46 PM

## 2015-10-25 ENCOUNTER — Other Ambulatory Visit (HOSPITAL_COMMUNITY): Payer: Self-pay | Admitting: Oncology

## 2015-10-25 ENCOUNTER — Telehealth (HOSPITAL_COMMUNITY): Payer: Self-pay | Admitting: *Deleted

## 2015-10-25 DIAGNOSIS — C7951 Secondary malignant neoplasm of bone: Principal | ICD-10-CM

## 2015-10-25 DIAGNOSIS — C50919 Malignant neoplasm of unspecified site of unspecified female breast: Secondary | ICD-10-CM

## 2015-10-25 MED ORDER — OXYCODONE HCL 10 MG PO TABS: ORAL_TABLET | ORAL | 0 refills | Status: AC

## 2015-10-25 MED FILL — ALPRAZolam 1 MG TABS: 1 | 30 days supply | Qty: 90 | Fill #5

## 2015-10-25 MED FILL — traZODone HCL 50 MG TABS: 50 | 30 days supply | Qty: 30 | Fill #5

## 2015-10-27 ENCOUNTER — Encounter (HOSPITAL_COMMUNITY): Payer: Self-pay

## 2015-10-27 ENCOUNTER — Other Ambulatory Visit (HOSPITAL_COMMUNITY): Payer: Self-pay

## 2015-10-27 ENCOUNTER — Encounter (HOSPITAL_COMMUNITY): Payer: Self-pay | Attending: Hematology & Oncology

## 2015-10-27 ENCOUNTER — Emergency Department (HOSPITAL_COMMUNITY): Payer: Self-pay

## 2015-10-27 ENCOUNTER — Ambulatory Visit (HOSPITAL_COMMUNITY): Payer: Self-pay | Admitting: Oncology

## 2015-10-27 ENCOUNTER — Inpatient Hospital Stay (HOSPITAL_COMMUNITY)
Admission: EM | Admit: 2015-10-27 | Discharge: 2015-10-31 | DRG: 682 | Disposition: A | Discharge status: Home / Self Care | Payer: Self-pay | Attending: Internal Medicine | Admitting: Internal Medicine

## 2015-10-27 ENCOUNTER — Encounter (HOSPITAL_COMMUNITY): Payer: Self-pay | Admitting: Emergency Medicine

## 2015-10-27 ENCOUNTER — Ambulatory Visit (HOSPITAL_COMMUNITY): Payer: Self-pay | Admitting: Hematology & Oncology

## 2015-10-27 DIAGNOSIS — R945 Abnormal results of liver function studies: Secondary | ICD-10-CM

## 2015-10-27 DIAGNOSIS — A419 Sepsis, unspecified organism: Secondary | ICD-10-CM

## 2015-10-27 DIAGNOSIS — G9340 Encephalopathy, unspecified: Secondary | ICD-10-CM | POA: Insufficient documentation

## 2015-10-27 DIAGNOSIS — C50912 Malignant neoplasm of unspecified site of left female breast: Secondary | ICD-10-CM | POA: Diagnosis present

## 2015-10-27 DIAGNOSIS — D638 Anemia in other chronic diseases classified elsewhere: Secondary | ICD-10-CM | POA: Diagnosis present

## 2015-10-27 DIAGNOSIS — G92 Toxic encephalopathy: Secondary | ICD-10-CM | POA: Diagnosis present

## 2015-10-27 DIAGNOSIS — C7951 Secondary malignant neoplasm of bone: Secondary | ICD-10-CM | POA: Diagnosis present

## 2015-10-27 DIAGNOSIS — G893 Neoplasm related pain (acute) (chronic): Secondary | ICD-10-CM | POA: Diagnosis present

## 2015-10-27 DIAGNOSIS — Z79811 Long term (current) use of aromatase inhibitors: Secondary | ICD-10-CM

## 2015-10-27 DIAGNOSIS — N19 Unspecified kidney failure: Secondary | ICD-10-CM

## 2015-10-27 DIAGNOSIS — Z9221 Personal history of antineoplastic chemotherapy: Secondary | ICD-10-CM

## 2015-10-27 DIAGNOSIS — E538 Deficiency of other specified B group vitamins: Secondary | ICD-10-CM | POA: Insufficient documentation

## 2015-10-27 DIAGNOSIS — R7989 Other specified abnormal findings of blood chemistry: Secondary | ICD-10-CM

## 2015-10-27 DIAGNOSIS — E869 Volume depletion, unspecified: Secondary | ICD-10-CM | POA: Diagnosis present

## 2015-10-27 DIAGNOSIS — I10 Essential (primary) hypertension: Secondary | ICD-10-CM | POA: Diagnosis present

## 2015-10-27 DIAGNOSIS — R7401 Elevation of levels of liver transaminase levels: Secondary | ICD-10-CM | POA: Diagnosis present

## 2015-10-27 DIAGNOSIS — Z79891 Long term (current) use of opiate analgesic: Secondary | ICD-10-CM

## 2015-10-27 DIAGNOSIS — C50919 Malignant neoplasm of unspecified site of unspecified female breast: Secondary | ICD-10-CM | POA: Insufficient documentation

## 2015-10-27 DIAGNOSIS — Z8249 Family history of ischemic heart disease and other diseases of the circulatory system: Secondary | ICD-10-CM

## 2015-10-27 DIAGNOSIS — F411 Generalized anxiety disorder: Secondary | ICD-10-CM | POA: Diagnosis present

## 2015-10-27 DIAGNOSIS — F329 Major depressive disorder, single episode, unspecified: Secondary | ICD-10-CM | POA: Insufficient documentation

## 2015-10-27 DIAGNOSIS — Z79899 Other long term (current) drug therapy: Secondary | ICD-10-CM

## 2015-10-27 DIAGNOSIS — I959 Hypotension, unspecified: Secondary | ICD-10-CM | POA: Diagnosis present

## 2015-10-27 DIAGNOSIS — R791 Abnormal coagulation profile: Secondary | ICD-10-CM | POA: Diagnosis present

## 2015-10-27 DIAGNOSIS — R740 Nonspecific elevation of levels of transaminase and lactic acid dehydrogenase [LDH]: Secondary | ICD-10-CM

## 2015-10-27 DIAGNOSIS — N179 Acute kidney failure, unspecified: Principal | ICD-10-CM | POA: Diagnosis present

## 2015-10-27 DIAGNOSIS — R4182 Altered mental status, unspecified: Secondary | ICD-10-CM

## 2015-10-27 DIAGNOSIS — F419 Anxiety disorder, unspecified: Secondary | ICD-10-CM | POA: Insufficient documentation

## 2015-10-27 DIAGNOSIS — E7220 Disorder of urea cycle metabolism, unspecified: Secondary | ICD-10-CM | POA: Diagnosis present

## 2015-10-27 DIAGNOSIS — F17210 Nicotine dependence, cigarettes, uncomplicated: Secondary | ICD-10-CM | POA: Diagnosis present

## 2015-10-27 DIAGNOSIS — Z8614 Personal history of Methicillin resistant Staphylococcus aureus infection: Secondary | ICD-10-CM | POA: Insufficient documentation

## 2015-10-27 DIAGNOSIS — K5900 Constipation, unspecified: Secondary | ICD-10-CM | POA: Diagnosis present

## 2015-10-27 DIAGNOSIS — Z809 Family history of malignant neoplasm, unspecified: Secondary | ICD-10-CM

## 2015-10-27 DIAGNOSIS — E872 Acidosis: Secondary | ICD-10-CM | POA: Diagnosis present

## 2015-10-27 DIAGNOSIS — Z9012 Acquired absence of left breast and nipple: Secondary | ICD-10-CM

## 2015-10-27 DIAGNOSIS — T402X5A Adverse effect of other opioids, initial encounter: Secondary | ICD-10-CM | POA: Diagnosis present

## 2015-10-27 LAB — CBC WITH DIFFERENTIAL/PLATELET
BASOS ABS: 0.1 10*3/uL (ref 0.0–0.1)
Basophils Relative: 0 %
EOS ABS: 0.1 10*3/uL (ref 0.0–0.7)
EOS PCT: 1 %
HCT: 32.9 % — ABNORMAL LOW (ref 36.0–46.0)
Hemoglobin: 10.6 g/dL — ABNORMAL LOW (ref 12.0–15.0)
Lymphocytes Relative: 7 %
Lymphs Abs: 1.2 10*3/uL (ref 0.7–4.0)
MCH: 28.8 pg (ref 26.0–34.0)
MCHC: 32.2 g/dL (ref 30.0–36.0)
MCV: 89.4 fL (ref 78.0–100.0)
Monocytes Absolute: 1.7 10*3/uL — ABNORMAL HIGH (ref 0.1–1.0)
Monocytes Relative: 10 %
Neutro Abs: 14.8 10*3/uL — ABNORMAL HIGH (ref 1.7–7.7)
Neutrophils Relative %: 82 %
PLATELETS: 521 10*3/uL — AB (ref 150–400)
RBC: 3.68 MIL/uL — AB (ref 3.87–5.11)
RDW: 15.5 % (ref 11.5–15.5)
WBC: 17.8 10*3/uL — AB (ref 4.0–10.5)

## 2015-10-27 LAB — COMPREHENSIVE METABOLIC PANEL
ALT: 56 U/L — AB (ref 14–54)
ALT: 57 U/L — AB (ref 14–54)
AST: 49 U/L — ABNORMAL HIGH (ref 15–41)
AST: 53 U/L — AB (ref 15–41)
Albumin: 2.7 g/dL — ABNORMAL LOW (ref 3.5–5.0)
Albumin: 3 g/dL — ABNORMAL LOW (ref 3.5–5.0)
Alkaline Phosphatase: 232 U/L — ABNORMAL HIGH (ref 38–126)
Alkaline Phosphatase: 241 U/L — ABNORMAL HIGH (ref 38–126)
Anion gap: 13 (ref 5–15)
Anion gap: 15 (ref 5–15)
BUN: 17 mg/dL (ref 6–20)
BUN: 17 mg/dL (ref 6–20)
CALCIUM: 7.9 mg/dL — AB (ref 8.9–10.3)
CHLORIDE: 101 mmol/L (ref 101–111)
CHLORIDE: 99 mmol/L — AB (ref 101–111)
CO2: 20 mmol/L — ABNORMAL LOW (ref 22–32)
CO2: 20 mmol/L — AB (ref 22–32)
CREATININE: 2.41 mg/dL — AB (ref 0.44–1.00)
CREATININE: 2.42 mg/dL — AB (ref 0.44–1.00)
Calcium: 8.1 mg/dL — ABNORMAL LOW (ref 8.9–10.3)
GFR calc non Af Amer: 21 mL/min — ABNORMAL LOW (ref >60)
GFR, EST AFRICAN AMERICAN: 25 mL/min — AB (ref >60)
GFR, EST AFRICAN AMERICAN: 25 mL/min — AB (ref >60)
GFR, EST NON AFRICAN AMERICAN: 21 mL/min — AB (ref >60)
Glucose, Bld: 128 mg/dL — ABNORMAL HIGH (ref 65–99)
Glucose, Bld: 113 mg/dL — ABNORMAL HIGH (ref 65–99)
POTASSIUM: 3.1 mmol/L — AB (ref 3.5–5.1)
Potassium: 2.9 mmol/L — ABNORMAL LOW (ref 3.5–5.1)
SODIUM: 134 mmol/L — AB (ref 135–145)
Sodium: 134 mmol/L — ABNORMAL LOW (ref 135–145)
TOTAL PROTEIN: 7.2 g/dL (ref 6.5–8.1)
Total Bilirubin: 0.9 mg/dL (ref 0.3–1.2)
Total Bilirubin: 0.9 mg/dL (ref 0.3–1.2)
Total Protein: 7.6 g/dL (ref 6.5–8.1)

## 2015-10-27 LAB — URINALYSIS, ROUTINE W REFLEX MICROSCOPIC
Glucose, UA: NEGATIVE mg/dL
Nitrite: NEGATIVE
PROTEIN: 100 mg/dL — AB
Specific Gravity, Urine: >1.03 — ABNORMAL HIGH (ref 1.005–1.030)
pH: 5 (ref 5.0–8.0)

## 2015-10-27 LAB — URINE MICROSCOPIC-ADD ON

## 2015-10-27 LAB — AMMONIA: AMMONIA: 65 umol/L — AB (ref 9–35)

## 2015-10-27 LAB — BLOOD GAS, ARTERIAL
ACID-BASE DEFICIT: 6.3 mmol/L — AB (ref 0.0–2.0)
Bicarbonate: 18.9 mmol/L — ABNORMAL LOW (ref 20.0–28.0)
Drawn by: 105551
FIO2: 21
O2 Saturation: 90.3 %
PCO2 ART: 45.6 mmHg (ref 32.0–48.0)
PH ART: 7.257 — AB (ref 7.350–7.450)
PO2 ART: 63.5 mmHg — AB (ref 83.0–108.0)

## 2015-10-27 LAB — I-STAT CG4 LACTIC ACID, ED
LACTIC ACID, VENOUS: 2.65 mmol/L — AB (ref 0.5–1.9)
LACTIC ACID, VENOUS: 1.19 mmol/L (ref 0.5–1.9)

## 2015-10-27 LAB — CBG MONITORING, ED: Glucose-Capillary: 120 mg/dL — ABNORMAL HIGH (ref 65–99)

## 2015-10-27 LAB — ETHANOL: Alcohol, Ethyl (B): 10 mg/dL — ABNORMAL HIGH (ref <5)

## 2015-10-27 LAB — PROCALCITONIN: Procalcitonin: 1.97 ng/mL

## 2015-10-27 MED ORDER — SODIUM CHLORIDE 0.9 % IV BOLUS (SEPSIS)
2000.0000 mL | Freq: Once | INTRAVENOUS | Status: AC
  Administered 2015-10-27: 2000 mL via INTRAVENOUS

## 2015-10-27 MED ORDER — SODIUM CHLORIDE 0.9 % IV BOLUS (SEPSIS)
1000.0000 mL | Freq: Once | INTRAVENOUS | Status: AC
  Administered 2015-10-27: 1000 mL via INTRAVENOUS

## 2015-10-27 MED ORDER — PIPERACILLIN-TAZOBACTAM 3.375 G IVPB 30 MIN
3.3750 g | Freq: Once | INTRAVENOUS | Status: AC
  Administered 2015-10-27: 3.375 g via INTRAVENOUS
  Filled 2015-10-27: qty 50

## 2015-10-27 MED ORDER — DIATRIZOATE MEGLUMINE & SODIUM 66-10 % PO SOLN
ORAL | Status: AC
  Filled 2015-10-27: qty 30

## 2015-10-27 MED ORDER — SODIUM CHLORIDE 0.9 % IV BOLUS (SEPSIS)
1000.0000 mL | Freq: Once | INTRAVENOUS | Status: AC
Start: 2015-10-27 — End: 2015-10-27
  Administered 2015-10-27: 1000 mL via INTRAVENOUS

## 2015-10-27 MED ORDER — VANCOMYCIN HCL IN DEXTROSE 1-5 GM/200ML-% IV SOLN
1000.0000 mg | Freq: Once | INTRAVENOUS | Status: AC
  Administered 2015-10-27: 1000 mg via INTRAVENOUS
  Filled 2015-10-27: qty 200

## 2015-10-27 NOTE — ED Provider Notes (Signed)
[facility] DEPT Provider Note   CSN: [id number] Arrival date & time: 10/27/15  1443     History   Chief Complaint Chief Complaint  Patient presents with  . Other    abnormal labs.     HPI Brittany Blair is a 55 y.o. female.  HPI Patient presents to the emergency department after being seen in follow-up at the cancer center.  She has cancer getting chemotherapy at this time.  She was recently in the hospital and discharge several days ago for UTI and acute encephalopathy.  She was obtaining routine lab work at the [facility] when she was found to have hypokalemia and new renal failure with a creatinine of 2.  Patient is without systemic complaints at this time except for mild generalized weakness.  She denies abdominal pain or back pain.  No nausea vomiting or diarrhea.  Denies dysuria or urinary frequency.  Reports no chest pain cough or shortness of breath.  She is on chronic opioids and chronic benzodiazepines.    Past Medical History:  Diagnosis Date  . Anxiety   . B12 deficiency 05/12/2015  . Breast cancer ([place]) dx'd 05/27/2010   chemo/xrt comp 01/2011  . Breast lump    left  . 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 She was stage III. It was lower inner quadrant. It was receptor positive, HER-2/neu negative, and had a KI-67 of 75%.  She was treated with neoadjuvant chemotherapy. She was recently hospitalized (June, 2012,) with multiple superficial MRSA infections. These have all r  . Depression   . Genital herpes 08/04/2012  . Hematoma   . Herpes   . Hypertension   . Lymph edema   . MRSA (methicillin resistant Staphylococcus aureus)   . Multiple blisters    along surgical site  . PMB (postmenopausal bleeding) 06/23/2013  . Swelling    left foot  . Vaginal delivery 1981, 1984, 1987, 1992    Patient Active Problem List     Diagnosis Date Noted  . Hyponatremia 10/17/2015  . Acute encephalopathy 10/17/2015  . Anemia of chronic disease 10/17/2015  . Polypharmacy 10/17/2015  . Altered mental status 10/17/2015  . UTI (lower urinary tract infection) 10/16/2015  . B12 deficiency 05/12/2015  . H/O hysterectomy with oophorectomy 04/22/2015  . Cancer related pain 04/05/2014  . Left sided numbness 02/14/2014  . Hypotension 02/14/2014  . Hypocalcemia 02/14/2014  . 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 GIB (gastrointestinal bleeding) 10/27/2013  . Syncope and collapse 10/27/2013  . Prepyloric ulcer 10/27/2013  . Acute blood loss anemia 10/26/2013  . Abdominal pain 10/26/2013  . AKI (acute kidney injury) ([place]) 10/26/2013  . PMB (postmenopausal bleeding) 06/23/2013  . Genital herpes 08/04/2012  . Syncope 04/28/2012  . GAD (generalized anxiety disorder) 04/28/2012  . Essential hypertension 04/28/2012  . Chronic chest pain 04/28/2012  . Depression 04/28/2012  . Transaminitis 04/28/2012  . 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 BONE BIOPSY Left 11/2013   left post. hip  . CYSTOSCOPY N/A 10/14/2013   Procedure: CYSTOSCOPY;  Surgeon: Ena Dawley, MD;  Location: [facility];  Service: Gynecology;  Laterality: N/A;  . ESOPHAGOGASTRODUODENOSCOPY N/A 10/27/2013   Procedure: ESOPHAGOGASTRODUODENOSCOPY (EGD);  Surgeon: Wonda Horner, MD;  Location:  [facility];  Service: Endoscopy;  Laterality: N/A;  . LAPAROSCOPIC ASSISTED VAGINAL HYSTERECTOMY Bilateral 10/14/2013   Procedure: LAPAROSCOPIC ASSISTED VAGINAL HYSTERECTOMY;  Surgeon: Ena Dawley, MD;  Location: [facility];  Service: Gynecology;  Laterality: Bilateral;  . LAPAROSCOPIC BILATERAL SALPINGO OOPHERECTOMY Bilateral 10/14/2013   Procedure: LAPAROSCOPIC BILATERAL SALPINGO OOPHORECTOMY;  Surgeon: Ena Dawley, MD;  Location: [facility];  Service:  Gynecology;  Laterality: Bilateral;  . LAPAROSCOPIC LYSIS OF ADHESIONS N/A 10/14/2013   Procedure: LAPAROSCOPIC LYSIS OF ADHESIONS;  Surgeon: Ena Dawley, MD;  Location: [facility];  Service: Gynecology;  Laterality: N/A;  . MASTECTOMY MODIFIED RADICAL  10/10/10    left -Dr Margot Chimes  . TUBAL LIGATION    . WISDOM TOOTH EXTRACTION      OB History    Gravida Para Term Preterm AB Living   4 4       4    SAB TAB Ectopic Multiple Live Births                   Home Medications    Prior to Admission medications   Medication Sig Start Date End Date Taking? Authorizing Provider  acyclovir (ZOVIRAX) 400 MG tablet Take 400 mg by mouth 2 (two) times daily.  09/04/15  Yes Historical Provider, MD  ALPRAZolam Duanne Moron) 1 MG tablet Take 0.5-1 mg by mouth 2 (two) times daily as needed for anxiety.  09/22/15  Yes Historical Provider, MD  amLODipine (NORVASC) 5 MG tablet Take 5 mg by mouth daily.   Yes Historical Provider, MD  clotrimazole (LOTRIMIN) 1 % cream Apply 1 application topically 2 (two) times daily as needed (yeast rash).   Yes Historical Provider, MD  cyclobenzaprine (FLEXERIL) 10 MG tablet Take 10 mg by mouth 3 (three) times daily as needed for muscle spasms.  09/05/15  Yes Historical Provider, MD  letrozole (FEMARA) 2.5 MG tablet Take 1 tablet (2.5 mg total) by mouth daily. Patient taking differently: Take 2.5 mg by mouth every evening.  12/15/14  Yes Manon Hilding Kefalas, PA-C  lisinopril (PRINIVIL,ZESTRIL) 40 MG tablet TAKE 1 TABLET BY MOUTH ONCE DAILY Patient taking differently: TAKE 1 TABLET BY MOUTH ONCE DAILY IN THE EVENING 06/27/15  Yes Thomas S Kefalas, PA-C  LYRICA 75 MG capsule Take 75 mg by mouth 2 (two) times daily.  09/07/15  Yes Historical Provider, MD  metoprolol succinate (TOPROL-XL) 25 MG 24 hr tablet Take 25 mg by mouth every evening.   Yes Historical Provider, MD  metoprolol tartrate (LOPRESSOR) 25 MG tablet Take 25 mg by mouth 2 (two) times daily.   Yes Historical Provider, MD    morphine (MS CONTIN) 60 MG 12 hr tablet Take 60 mg by mouth every 12 (twelve) hours.  08/28/15  Yes Historical Provider, MD  Oxycodone HCl 10 MG TABS Take 1 or 2 tablets every 4 hours to control pain. Patient taking differently: Take 20 mg by mouth every 4 (four) hours. to control pain. 10/25/15  Yes Manon Hilding Kefalas, PA-C  palbociclib (IBRANCE) 100 MG capsule Take 100 mg by mouth every evening. Take whole with food.   Yes Historical Provider, MD  temazepam (RESTORIL) 15 MG capsule TAKE 1 TO 2 CAPSULES BY MOUTH AT BEDTIME AS NEEDED FOR SLEEP 07/27/15  Yes Baird Cancer, PA-C  traZODone (DESYREL) 50 MG tablet Take 50 mg by mouth at bedtime. 09/22/15  Yes Historical Provider, MD  venlafaxine XR (EFFEXOR-XR) 75 MG 24 hr capsule TAKE 3 CAPSULES BY MOUTH DAILY Patient taking differently: TAKE 3 CAPSULES BY  MOUTH DAILY IN THE EVENING 08/07/15  Yes Baird Cancer, PA-C  tetrahydrozoline-zinc (VISINE-AC) 0.05-0.25 % ophthalmic solution Place 2 drops into both eyes 3 (three) times daily as needed (for allergies).     Historical Provider, MD  venlafaxine XR (EFFEXOR-XR) 75 MG 24 hr capsule Take 1 capsule (75 mg total) by mouth daily. Patient not taking: Reported on 10/27/2015 10/25/15   Erline Hau, MD    Family History Family History  Problem Relation Age of Onset  . Heart disease Father     heart attack  . Hypertension Daughter   . Hypertension Son   . Cancer Maternal Grandmother     ovarian  . Hypertension Maternal Grandfather   . Cancer Paternal Grandmother     ovarian  . Hypertension Paternal Grandfather     Social History Social History  Substance Use Topics  . Smoking status: Current Every Day Smoker    Packs/day: 0.35    Types: Cigarettes    Last attempt to quit: 09/26/2013  . Smokeless tobacco: Never Used  . Alcohol use 4.2 oz/week    7 Glasses of wine per week     Comment: 2 days ago.      Allergies   Bee venom   Review of Systems Review of Systems  All other  systems reviewed and are negative.    Physical Exam Updated Vital Signs BP 97/61   Pulse 101   Temp 98.7 F (37.1 C) (Rectal)   Resp 12   Ht 5' 5"  (1.651 m)   Wt 226 lb (102.5 kg)   LMP 05/29/2013   SpO2 100%   BMI 37.61 kg/m   Physical Exam  Constitutional: She is oriented to person, place, and time. She appears well-developed and well-nourished. No distress.  HENT:  Head: Normocephalic and atraumatic.  Eyes: EOM are normal.  Neck: Normal range of motion.  Cardiovascular: Normal rate, regular rhythm and normal heart sounds.   Pulmonary/Chest: Effort normal and breath sounds normal.  Abdominal: Soft. She exhibits no distension. There is no tenderness.  Musculoskeletal: Normal range of motion.  Neurological: She is alert and oriented to person, place, and time.  Skin: Skin is warm and dry.  Psychiatric: She has a normal mood and affect. Judgment normal.  Nursing note and vitals reviewed.    ED Treatments / Results  Labs (all labs ordered are listed, but only abnormal results are displayed) Labs Reviewed  URINALYSIS, ROUTINE W REFLEX MICROSCOPIC (NOT AT [facility]) - Abnormal; Notable for the following:       Result Value   Specific Gravity, Urine >1.030 (*)    Hgb urine dipstick LARGE (*)    Bilirubin Urine MODERATE (*)    Ketones, ur TRACE (*)    Protein, ur 100 (*)    Leukocytes, UA TRACE (*)    All other components within normal limits  CBC WITH DIFFERENTIAL/PLATELET - Abnormal; Notable for the following:    WBC 17.8 (*)    RBC 3.68 (*)    Hemoglobin 10.6 (*)    HCT 32.9 (*)    Platelets 521 (*)    Neutro Abs 14.8 (*)    Monocytes Absolute 1.7 (*)    All other components within normal limits  COMPREHENSIVE METABOLIC PANEL - Abnormal; Notable for the following:    Sodium 134 (*)    Potassium 3.1 (*)    Chloride 99 (*)    CO2 20 (*)    Glucose, Bld 113 (*)    Creatinine,  Ser 2.42 (*)    Calcium 8.1 (*)    Albumin 3.0 (*)    AST 53 (*)    ALT 57 (*)     Alkaline Phosphatase 241 (*)    GFR calc non Af Amer 21 (*)    GFR calc Af Amer 25 (*)    All other components within normal limits  URINE MICROSCOPIC-ADD ON - Abnormal; Notable for the following:    Squamous Epithelial / LPF 0-5 (*)    Bacteria, UA MANY (*)    Casts GRANULAR CAST (*)    All other components within normal limits  AMMONIA - Abnormal; Notable for the following:    Ammonia 65 (*)    All other components within normal limits  ETHANOL - Abnormal; Notable for the following:    Alcohol, Ethyl (B) 10 (*)    All other components within normal limits  BLOOD GAS, ARTERIAL - Abnormal; Notable for the following:    pH, Arterial 7.257 (*)    pO2, Arterial 63.5 (*)    Bicarbonate 18.9 (*)    Acid-base deficit 6.3 (*)    All other components within normal limits  I-STAT CG4 LACTIC ACID, ED - Abnormal; Notable for the following:    Lactic Acid, Venous 2.65 (*)    All other components within normal limits  CBG MONITORING, ED - Abnormal; Notable for the following:    Glucose-Capillary 120 (*)    All other components within normal limits  PROCALCITONIN  I-STAT CG4 LACTIC ACID, ED    EKG  EKG Interpretation None       Radiology Dg Chest Portable 1 View  Result Date: 10/27/2015 CLINICAL DATA:  Altered mental status and sepsis. EXAM: PORTABLE CHEST 1 VIEW COMPARISON:  10/16/2015 radiograph FINDINGS: Cardiomegaly and mild pulmonary vascular congestion noted. There is no evidence of focal airspace disease, pulmonary edema, suspicious pulmonary nodule/mass, pleural effusion, or pneumothorax. No acute bony abnormalities are identified. IMPRESSION: Cardiomegaly with mild pulmonary vascular congestion. Electronically Signed   By: Margarette Canada M.D.   On: 10/27/2015 21:19      Procedures +++++++++++++++++++++++++++++++++++++++++++  .Critical Care Performed by: Jola Schmidt Authorized by: Jola Schmidt    Total critical care time: 40 minutes Critical care time was exclusive of  separately billable procedures and treating other patients. Critical care was necessary to treat or prevent imminent or life-threatening deterioration. Critical care was time spent personally by me on the following activities: development of treatment plan with patient and/or surrogate as well as nursing, discussions with consultants, evaluation of patient's response to treatment, examination of patient, obtaining history from patient or surrogate, ordering and performing treatments and interventions, ordering and review of laboratory studies, ordering and review of radiographic studies, pulse oximetry and re-evaluation of patient's condition.    Angiocath insertion Performed by: Hoy Morn Consent: Verbal consent obtained. Risks and benefits: risks, benefits and alternatives were discussed Time out: Immediately prior to procedure a "time out" was called to verify the correct patient, procedure, equipment, support staff and site/side marked as required. Preparation: Patient was prepped and draped in the usual sterile fashion. Vein Location: right basilic  Ultrasound Guided Gauge: 20 Normal blood return and flush without difficulty Patient tolerance: Patient tolerated the procedure well with no immediate complications.     +++++++++++++++++++++++++++++++++++++++++++++++++    (including critical care time)  Medications Ordered in ED Medications  diatrizoate meglumine-sodium (GASTROGRAFIN) 66-10 % solution (not administered)  sodium chloride 0.9 % bolus 1,000 mL (not administered)  sodium chloride 0.9 %  bolus 2,000 mL (0 mLs Intravenous Stopped 10/27/15 1756)  sodium chloride 0.9 % bolus 1,000 mL (0 mLs Intravenous Stopped 10/27/15 1905)  vancomycin (VANCOCIN) IVPB 1000 mg/200 mL premix (0 mg Intravenous Stopped 10/27/15 2045)  piperacillin-tazobactam (ZOSYN) IVPB 3.375 g (0 g Intravenous Stopped 10/27/15 1935)  sodium chloride 0.9 % bolus 1,000 mL (1,000 mLs Intravenous New Bag/Given  10/27/15 2010)     Initial Impression / Assessment and Plan / ED Course  I have reviewed the triage vital signs and the nursing notes.  Pertinent labs & imaging results that were available during my care of the patient were reviewed by me and considered in my medical decision making (see chart for details).  Clinical Course    Hypotensive and tachycardic on arrival with elevated white blood cell count.  Broad-spectrum antibiotics initiated.  Patient overall well-appearing.  Rectal temp is normal Throughout her course in the emergency department however she became more altered and more difficult to arouse.  ABG does not show significant hypercarbia.  She has nothing focal on exam and I do not think she needs a CT scan of her head at this time as this was completed several days ago and was without abnormality.  She has no meningeal signs on examination.  She looks volume depleted and is in renal failure.  Blood sugar was rechecked and was normal.  Lactate will be rechecked at this time.  Bladder scan demonstrates 320 cc of urine.  She has not made a significant amount of urine despite nearly 5 L of IV fluids here in the emergency department.  If her lactate remains elevated and or is worsening she may benefit from pressors.  She'll be admitted to stepdown unit.  I had a prolonged discussion with our hospitalist Dr. Shanon Brow reguarding the differential and are multiple different options for treatment.  The pt is on chronic benzodiazepines and chronic opioids but she has not taken any while here in the emergency department.  She's been given none through the ER.  These could remain in her system with her renal failure but at this time she is protecting her airway and I do not think that Narcan will be of benefit.  If anything this can worsen her symptoms in terms of agitation and delirium as this will induce opioid withdrawal.  Final Clinical Impressions(s) / ED Diagnoses   Final diagnoses:  Hypotension,  unspecified hypotension type  Renal failure  Altered mental status, unspecified altered mental status type    New Prescriptions New Prescriptions   No medications on file     Jola Schmidt, MD 10/27/15 2215

## 2015-10-27 NOTE — ED Notes (Signed)
Unable to obtain blood for I stat lactic acid.

## 2015-10-27 NOTE — ED Triage Notes (Signed)
Pt seen Dr. Whitney Muse for labs today. Has stage 4 breast CA with mets. Potassium 2.9 and other abnormal labs. Pt sudden onset of nausea while upstairs. A/o.

## 2015-10-27 NOTE — Progress Notes (Signed)
Patient to the ER per Dr.  Muse after lab results reviewed.  Patient wheeled to ER by [facility] staff.

## 2015-10-27 NOTE — ED Notes (Signed)
Brittany Blair - spouse - [phone] home; [phone] cell.

## 2015-10-27 NOTE — ED Notes (Signed)
360 ml of urine found with bladder scan

## 2015-10-28 ENCOUNTER — Encounter (HOSPITAL_COMMUNITY): Payer: Self-pay | Admitting: *Deleted

## 2015-10-28 ENCOUNTER — Inpatient Hospital Stay (HOSPITAL_COMMUNITY): Payer: Self-pay

## 2015-10-28 DIAGNOSIS — R4182 Altered mental status, unspecified: Secondary | ICD-10-CM

## 2015-10-28 DIAGNOSIS — N19 Unspecified kidney failure: Secondary | ICD-10-CM

## 2015-10-28 LAB — COMPREHENSIVE METABOLIC PANEL
ALBUMIN: 2.6 g/dL — AB (ref 3.5–5.0)
ALK PHOS: 209 U/L — AB (ref 38–126)
ALT: 47 U/L (ref 14–54)
AST: 53 U/L — AB (ref 15–41)
Anion gap: 11 (ref 5–15)
BILIRUBIN TOTAL: 0.8 mg/dL (ref 0.3–1.2)
BUN: 19 mg/dL (ref 6–20)
CO2: 17 mmol/L — AB (ref 22–32)
Calcium: 6.9 mg/dL — ABNORMAL LOW (ref 8.9–10.3)
Chloride: 107 mmol/L (ref 101–111)
Creatinine, Ser: 2.21 mg/dL — ABNORMAL HIGH (ref 0.44–1.00)
GFR calc Af Amer: 28 mL/min — ABNORMAL LOW (ref >60)
GFR calc non Af Amer: 24 mL/min — ABNORMAL LOW (ref >60)
GLUCOSE: 90 mg/dL (ref 65–99)
POTASSIUM: 3.3 mmol/L — AB (ref 3.5–5.1)
SODIUM: 135 mmol/L (ref 135–145)
TOTAL PROTEIN: 6.4 g/dL — AB (ref 6.5–8.1)

## 2015-10-28 LAB — CBC
HEMATOCRIT: 32 % — AB (ref 36.0–46.0)
HEMOGLOBIN: 10.2 g/dL — AB (ref 12.0–15.0)
MCH: 29.6 pg (ref 26.0–34.0)
MCHC: 31.9 g/dL (ref 30.0–36.0)
MCV: 92.8 fL (ref 78.0–100.0)
Platelets: 373 10*3/uL (ref 150–400)
RBC: 3.45 MIL/uL — ABNORMAL LOW (ref 3.87–5.11)
RDW: 16.1 % — AB (ref 11.5–15.5)
WBC: 14.4 10*3/uL — ABNORMAL HIGH (ref 4.0–10.5)

## 2015-10-28 LAB — PROTIME-INR
INR: 1.78
PROTHROMBIN TIME: 20.9 s — AB (ref 11.4–15.2)

## 2015-10-28 LAB — LACTIC ACID, PLASMA
LACTIC ACID, VENOUS: 1.2 mmol/L (ref 0.5–1.9)
Lactic Acid, Venous: 1.7 mmol/L (ref 0.5–1.9)

## 2015-10-28 LAB — MRSA PCR SCREENING: MRSA BY PCR: NEGATIVE

## 2015-10-28 LAB — MAGNESIUM: Magnesium: 2 mg/dL (ref 1.7–2.4)

## 2015-10-28 LAB — TSH: TSH: 0.887 u[IU]/mL (ref 0.350–4.500)

## 2015-10-28 LAB — APTT: APTT: 41 s — AB (ref 24–36)

## 2015-10-28 MED ORDER — ONDANSETRON HCL 4 MG/2ML IJ SOLN: 4.0000 mg | Freq: Four times a day (QID) | INTRAMUSCULAR | Status: DC | PRN

## 2015-10-28 MED ORDER — VANCOMYCIN HCL IN DEXTROSE 1-5 GM/200ML-% IV SOLN
1000.0000 mg | Freq: Once | INTRAVENOUS | Status: AC
  Administered 2015-10-28: 1000 mg via INTRAVENOUS
  Filled 2015-10-28: qty 200

## 2015-10-28 MED ORDER — OXYCODONE HCL 5 MG PO TABS
10.0000 mg | ORAL_TABLET | Freq: Four times a day (QID) | ORAL | Status: DC | PRN
  Administered 2015-10-28 – 2015-10-31 (×8): 10 mg via ORAL
  Filled 2015-10-28 (×8): qty 2

## 2015-10-28 MED ORDER — ONDANSETRON HCL 4 MG PO TABS
4.0000 mg | ORAL_TABLET | Freq: Four times a day (QID) | ORAL | Status: DC | PRN
  Administered 2015-10-30: 4 mg via ORAL
  Filled 2015-10-28: qty 1

## 2015-10-28 MED ORDER — PIPERACILLIN-TAZOBACTAM 3.375 G IVPB
3.3750 g | Freq: Three times a day (TID) | INTRAVENOUS | Status: DC
  Administered 2015-10-28 – 2015-10-30 (×7): 3.375 g via INTRAVENOUS
  Filled 2015-10-28 (×7): qty 50

## 2015-10-28 MED ORDER — OXYCODONE HCL 5 MG PO TABS
10.0000 mg | ORAL_TABLET | Freq: Once | ORAL | Status: AC
  Administered 2015-10-28: 10 mg via ORAL
  Filled 2015-10-28: qty 2

## 2015-10-28 MED ORDER — KCL IN DEXTROSE-NACL 20-5-0.9 MEQ/L-%-% IV SOLN
INTRAVENOUS | Status: DC
  Administered 2015-10-28 – 2015-10-29 (×2): via INTRAVENOUS

## 2015-10-28 MED ORDER — POTASSIUM CHLORIDE IN NACL 20-0.9 MEQ/L-% IV SOLN
INTRAVENOUS | Status: AC
  Administered 2015-10-28: 01:00:00 via INTRAVENOUS

## 2015-10-28 MED ORDER — LORAZEPAM 1 MG PO TABS
1.0000 mg | ORAL_TABLET | Freq: Four times a day (QID) | ORAL | Status: DC | PRN
  Administered 2015-10-28 – 2015-10-31 (×9): 1 mg via ORAL
  Filled 2015-10-28 (×9): qty 1

## 2015-10-28 MED ORDER — ENOXAPARIN SODIUM 60 MG/0.6ML ~~LOC~~ SOLN
0.5000 mg/kg | SUBCUTANEOUS | Status: DC
  Administered 2015-10-28 – 2015-10-30 (×3): 50 mg via SUBCUTANEOUS
  Filled 2015-10-28 (×3): qty 0.6

## 2015-10-28 NOTE — Progress Notes (Signed)
Follow up note from admission earlier today: 55 yo female with hx of metastatic breast CA to the bone, anxiety, depression, admitted for AKI, possible sepsis with hypotension, transient altered mental status, felt to be due to medication, started on IV Van/Zosyn, given IVF, and cultured. She is clearly better today, conversed meaningfully, and maintained stable hemodynamics.  Cr is still elevated at 2.2, K of 3.3. Will continue current Tx, transfer to telemetry when bed is available. Can start diet, continue IVF and IV antibiotics. Follow labs in the am. Replete K. Check on cultures.  Thanks,  Orvan Falconer MD FACP Hopsitalist.

## 2015-10-28 NOTE — Progress Notes (Signed)
Pharmacy Antibiotic Note  Brittany Blair is a 55 y.o. female admitted on 10/27/2015 with sepsis.  Pharmacy has been consulted for Vancomycin and Zosyn dosing.  Plan: Vancomycin 2000mg  dose given, then 1250mg  IV every 24 hours.  Goal trough 15-20 mcg/mL. Zosyn 3.375g IV q8h (4 hour infusion).  F/U cultures Monitor V/S and labs Deescalate therapy as indicated  Height: 5\' 4"  (162.6 cm) Weight: 228 lb 2.8 oz (103.5 kg) IBW/kg (Calculated) : 54.7  Temp (24hrs), Avg:98.7 F (37.1 C), Min:98 F (36.7 C), Max:99.8 F (37.7 C)   Recent Labs Lab 10/24/15 0618 10/27/15 1330 10/27/15 1613 10/27/15 1654 10/27/15 2258 10/27/15 2325 10/28/15 0431  WBC 15.4*  --  17.8*  --   --   --  14.4*  CREATININE 0.44 2.41* 2.42*  --   --   --  2.21*  LATICACIDVEN  --   --   --  2.65* 1.2 1.19 1.7    Estimated Creatinine Clearance: 33.7 mL/min (by C-G formula based on SCr of 2.21 mg/dL).    Allergies  Allergen Reactions  . Bee Venom Anaphylaxis    Antimicrobials this admission: Vancomycin 9/1 >> Zosyn 9/1>>   Dose adjustments this admission: N/A  Microbiology results: 9/2 BCx: pending 9/2 UCx: pending 9/2MRSA PCR: negative  Thank you for allowing pharmacy to be a part of this patient's care. Isac Sarna, BS Vena Austria, BCPS Clinical Pharmacist Pager [phone]  Cristy Friedlander 10/28/2015 7:58 AM

## 2015-10-28 NOTE — Progress Notes (Signed)
Report given to nurse on unit 300. Patient transferred to room 319 via bed.

## 2015-10-28 NOTE — H&P (Signed)
History and Physical    Brittany Blair OZH:[id number] DOB: 1960-05-11 DOA: 10/27/2015  PCP: No PCP Per Patient  Patient coming from: home  Chief Complaint:   Abnormal labs at oncologist office  HPI: Brittany Blair is a 55 y.o. 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  Pt had a recent hospitalization for uti and encephalopathy, was treated and d/c on 8/29 to home.  Her renal function today was abnormal and showed worsening AKI with a cr up to almost 3.  She was called and advised to go to the ED.  On arrival to the ED pt was mentating normally, and had no complaints.  However during the course of the first couple of hours she became more confused and somnulent.  She had been hypotensive with sbp in the mid 80s(however looking back at her prev bp she sometimes runs low) was give about 5 liters of ivf in the ED and her mental status continued to deteriorate with somnulence.  Her bp improved and all other vitals remained normal and pt was protecting her airway.  Narcan was intertained to be be given by me and dr Patria Mane in the ED but it was decided to hold off on that and closely monitor.  Over the next couple of hours she began to awaken and become more responsive.  At this time in the stepdown unit she is much more alert.  She denies any recent illnesses.  She says she has been having a lot of nausea from her chemo treatments but no vomiting or diarrhea.  She denies any respiratory symptoms, no urinary symptoms and no fevers or rashes.  She denies taking any extra pills today.  She says however her pain has been much worse in the last couple of days with the change in weather.  Pt referred for admission for hypotension, AKI and possible sepsis vs dehydration.  Pt is waking up now and is in significant pain per her report.   Review of Systems: As per HPI  otherwise 10 point review of systems negative.   Past Medical History:  Diagnosis Date  . Anxiety   . B12 deficiency 05/12/2015  . Breast cancer (HCC) dx'd 05/27/2010   chemo/xrt comp 01/2011  . Breast lump    left  . Carcinoma of breast metastatic to bone (HCC) 05/28/2010   This patient presented in April with an invasive ductal carcinoma that had broken through the skin and was contaminated with MRSA. She was stage III. It was lower inner quadrant. It was receptor positive, HER-2/neu negative, and had a KI-67 of 75%.  She was treated with neoadjuvant chemotherapy. She was recently hospitalized (June, 2012,) with multiple superficial MRSA infections. These have all r  . Depression   . Genital herpes 08/04/2012  . Hematoma   . Herpes   . Hypertension   . Lymph edema   . MRSA (methicillin resistant Staphylococcus aureus)   . Multiple blisters    along surgical site  . PMB (postmenopausal bleeding) 06/23/2013  . Swelling    left foot  . Vaginal delivery 1981, 1984, 1987, 1992    Past Surgical History:  Procedure Laterality Date  . BONE BIOPSY Left 11/2013   left post. hip  . CYSTOSCOPY N/A 10/14/2013   Procedure: CYSTOSCOPY;  Surgeon: Kirkland Hun, MD;  Location: [facility];  Service: Gynecology;  Laterality: N/A;  .  ESOPHAGOGASTRODUODENOSCOPY N/A 10/27/2013   Procedure: ESOPHAGOGASTRODUODENOSCOPY (EGD);  Surgeon: Graylin Shiver, MD;  Location: [facility];  Service: Endoscopy;  Laterality: N/A;  . LAPAROSCOPIC ASSISTED VAGINAL HYSTERECTOMY Bilateral 10/14/2013   Procedure: LAPAROSCOPIC ASSISTED VAGINAL HYSTERECTOMY;  Surgeon: Kirkland Hun, MD;  Location: [facility];  Service: Gynecology;  Laterality: Bilateral;  . LAPAROSCOPIC BILATERAL SALPINGO OOPHERECTOMY Bilateral 10/14/2013   Procedure: LAPAROSCOPIC BILATERAL SALPINGO OOPHORECTOMY;  Surgeon: Kirkland Hun, MD;  Location: [facility];  Service: Gynecology;  Laterality: Bilateral;  . LAPAROSCOPIC LYSIS OF ADHESIONS N/A 10/14/2013   Procedure:  LAPAROSCOPIC LYSIS OF ADHESIONS;  Surgeon: Kirkland Hun, MD;  Location: [facility];  Service: Gynecology;  Laterality: N/A;  . MASTECTOMY MODIFIED RADICAL  10/10/10    left -Dr Jamey Ripa  . TUBAL LIGATION    . WISDOM TOOTH EXTRACTION       reports that she has been smoking Cigarettes.  She has been smoking about 0.35 packs per day. She has never used smokeless tobacco. She reports that she drinks about 4.2 oz of alcohol per week . She reports that she does not use drugs.  Allergies  Allergen Reactions  . Bee Venom Anaphylaxis    Family History  Problem Relation Age of Onset  . Heart disease Father     heart attack  . Hypertension Daughter   . Hypertension Son   . Cancer Maternal Grandmother     ovarian  . Hypertension Maternal Grandfather   . Cancer Paternal Grandmother     ovarian  . Hypertension Paternal Grandfather     Prior to Admission medications   Medication Sig Start Date End Date Taking? Authorizing Provider  acyclovir (ZOVIRAX) 400 MG tablet Take 400 mg by mouth 2 (two) times daily.  09/04/15  Yes Historical Provider, MD  ALPRAZolam Prudy Feeler) 1 MG tablet Take 0.5-1 mg by mouth 2 (two) times daily as needed for anxiety.  09/22/15  Yes Historical Provider, MD  amLODipine (NORVASC) 5 MG tablet Take 5 mg by mouth daily.   Yes Historical Provider, MD  clotrimazole (LOTRIMIN) 1 % cream Apply 1 application topically 2 (two) times daily as needed (yeast rash).   Yes Historical Provider, MD  cyclobenzaprine (FLEXERIL) 10 MG tablet Take 10 mg by mouth 3 (three) times daily as needed for muscle spasms.  09/05/15  Yes Historical Provider, MD  letrozole (FEMARA) 2.5 MG tablet Take 1 tablet (2.5 mg total) by mouth daily. Patient taking differently: Take 2.5 mg by mouth every evening.  12/15/14  Yes Maurine Minister Kefalas, PA-C  lisinopril (PRINIVIL,ZESTRIL) 40 MG tablet TAKE 1 TABLET BY MOUTH ONCE DAILY Patient taking differently: TAKE 1 TABLET BY MOUTH ONCE DAILY IN THE EVENING 06/27/15  Yes Thomas  S Kefalas, PA-C  LYRICA 75 MG capsule Take 75 mg by mouth 2 (two) times daily.  09/07/15  Yes Historical Provider, MD  metoprolol succinate (TOPROL-XL) 25 MG 24 hr tablet Take 25 mg by mouth every evening.   Yes Historical Provider, MD  metoprolol tartrate (LOPRESSOR) 25 MG tablet Take 25 mg by mouth 2 (two) times daily.   Yes Historical Provider, MD  morphine (MS CONTIN) 60 MG 12 hr tablet Take 60 mg by mouth every 12 (twelve) hours.  08/28/15  Yes Historical Provider, MD  Oxycodone HCl 10 MG TABS Take 1 or 2 tablets every 4 hours to control pain. Patient taking differently: Take 20 mg by mouth every 4 (four) hours. to control pain. 10/25/15  Yes Maurine Minister Kefalas, PA-C  palbociclib (IBRANCE) 100 MG capsule  Take 100 mg by mouth every evening. Take whole with food.   Yes Historical Provider, MD  temazepam (RESTORIL) 15 MG capsule TAKE 1 TO 2 CAPSULES BY MOUTH AT BEDTIME AS NEEDED FOR SLEEP 07/27/15  Yes Ellouise Newer, PA-C  traZODone (DESYREL) 50 MG tablet Take 50 mg by mouth at bedtime. 09/22/15  Yes Historical Provider, MD  venlafaxine XR (EFFEXOR-XR) 75 MG 24 hr capsule TAKE 3 CAPSULES BY MOUTH DAILY Patient taking differently: TAKE 3 CAPSULES BY MOUTH DAILY IN THE EVENING 08/07/15  Yes Ellouise Newer, PA-C  tetrahydrozoline-zinc (VISINE-AC) 0.05-0.25 % ophthalmic solution Place 2 drops into both eyes 3 (three) times daily as needed (for allergies).     Historical Provider, MD  venlafaxine XR (EFFEXOR-XR) 75 MG 24 hr capsule Take 1 capsule (75 mg total) by mouth daily. Patient not taking: Reported on 10/27/2015 10/25/15   Henderson Cloud, MD    Physical Exam: Vitals:   10/27/15 2215 10/27/15 2300 10/27/15 2315 10/28/15 0010  BP: 104/63 94/61 95/75    Pulse:   100   Resp: (!) 9 13 (!) 9   Temp:    98.2 F (36.8 C)  TempSrc:    Oral  SpO2:   100%   Weight:    103.1 kg (227 lb 4.7 oz)  Height:    5\' 4"  (1.626 m)      Constitutional: NAD, calm, comfortable Vitals:   10/27/15 2215  10/27/15 2300 10/27/15 2315 10/28/15 0010  BP: 104/63 94/61 95/75    Pulse:   100   Resp: (!) 9 13 (!) 9   Temp:    98.2 F (36.8 C)  TempSrc:    Oral  SpO2:   100%   Weight:    103.1 kg (227 lb 4.7 oz)  Height:    5\' 4"  (1.626 m)   Eyes: PERRL, lids and conjunctivae normal ENMT: Mucous membranes are moist. Posterior pharynx clear of any exudate or lesions.Normal dentition.  Neck: normal, supple, no masses, no thyromegaly Respiratory: clear to auscultation bilaterally, no wheezing, no crackles. Normal respiratory effort. No accessory muscle use.  Cardiovascular: Regular rate and rhythm, no murmurs / rubs / gallops. No extremity edema. 2+ pedal pulses. No carotid bruits.  Abdomen: no tenderness, no masses palpated. No hepatosplenomegaly. Bowel sounds positive.  Musculoskeletal: no clubbing / cyanosis. No joint deformity upper and lower extremities. Good ROM, no contractures. Normal muscle tone.  Skin: no rashes, lesions, ulcers. No induration Neurologic: CN 2-12 grossly intact. Sensation intact, DTR normal. Strength 5/5 in all 4.  Psychiatric: Normal judgment and insight. Alert and oriented x 3. Normal mood.    Labs on Admission: I have personally reviewed following labs and imaging studies  CBC:  Recent Labs Lab 10/24/15 0618 10/27/15 1613  WBC 15.4* 17.8*  NEUTROABS  --  14.8*  HGB 12.0 10.6*  HCT 35.0* 32.9*  MCV 87.1 89.4  PLT 516* 521*   Basic Metabolic Panel:  Recent Labs Lab 10/24/15 0618 10/27/15 1330 10/27/15 1613  NA 138 134* 134*  K 3.5 2.9* 3.1*  CL 106 101 99*  CO2 20* 20* 20*  GLUCOSE 110* 128* 113*  BUN 10 17 17   CREATININE 0.44 2.41* 2.42*  CALCIUM 8.2* 7.9* 8.1*   GFR: Estimated Creatinine Clearance: 30.7 mL/min (by C-G formula based on SCr of 2.42 mg/dL). Liver Function Tests:  Recent Labs Lab 10/27/15 1330 10/27/15 1613  AST 49* 53*  ALT 56* 57*  ALKPHOS 232* 241*  BILITOT 0.9 0.9  PROT 7.2 7.6  ALBUMIN 2.7* 3.0*   No results for  input(s): LIPASE, AMYLASE in the last 168 hours.  Recent Labs Lab 10/27/15 2042  AMMONIA 65*   CBG:  Recent Labs Lab 10/21/15 0034 10/27/15 2114  GLUCAP 110* 120*   Urine analysis:    Component Value Date/Time   COLORURINE YELLOW 10/27/2015 1855   APPEARANCEUR CLEAR 10/27/2015 1855   LABSPEC >1.030 (H) 10/27/2015 1855   PHURINE 5.0 10/27/2015 1855   GLUCOSEU NEGATIVE 10/27/2015 1855   HGBUR LARGE (A) 10/27/2015 1855   BILIRUBINUR MODERATE (A) 10/27/2015 1855   KETONESUR TRACE (A) 10/27/2015 1855   PROTEINUR 100 (A) 10/27/2015 1855   UROBILINOGEN 0.2 10/26/2013 1942   NITRITE NEGATIVE 10/27/2015 1855   LEUKOCYTESUR TRACE (A) 10/27/2015 1855    Recent Results (from the past 240 hour(s))  Culture, Urine     Status: None   Collection Time: 10/18/15  5:40 PM  Result Value Ref Range Status   Specimen Description URINE, CLEAN CATCH  Final   Special Requests NONE  Final   Culture NO GROWTH Performed at [facility]   Final   Report Status 10/20/2015 FINAL  Final  Culture, blood (routine x 2)     Status: None (Preliminary result)   Collection Time: 10/27/15 10:35 PM  Result Value Ref Range Status   Specimen Description BLOOD RIGHT HAND  Final   Special Requests BOTTLES DRAWN AEROBIC AND ANAEROBIC [facility] EACH  Final   Culture PENDING  Incomplete   Report Status PENDING  Incomplete  Culture, blood (routine x 2)     Status: None (Preliminary result)   Collection Time: 10/27/15 10:59 PM  Result Value Ref Range Status   Specimen Description BLOOD RIGHT HAND  Final   Special Requests BOTTLES DRAWN AEROBIC AND ANAEROBIC [facility] EACH  Final   Culture PENDING  Incomplete   Report Status PENDING  Incomplete     Radiological Exams on Admission: Dg Chest Portable 1 View  Result Date: 10/27/2015 CLINICAL DATA:  Altered mental status and sepsis. EXAM: PORTABLE CHEST 1 VIEW COMPARISON:  10/16/2015 radiograph FINDINGS: Cardiomegaly and mild pulmonary vascular congestion noted.  There is no evidence of focal airspace disease, pulmonary edema, suspicious pulmonary nodule/mass, pleural effusion, or pneumothorax. No acute bony abnormalities are identified. IMPRESSION: Cardiomegaly with mild pulmonary vascular congestion. Electronically Signed   By: Harmon Pier M.D.   On: 10/27/2015 21:19    EKG:  Pending cxr reviewed and mild edema no infiltrate Old records reviewed Case discussed with dr Patria Mane, ED RN and ICU RN   Assessment/Plan 55 yo female with encephalopathy, hypotension, and AKI with h/o breast cancer with metastic disease to bone  Principal Problem:   AKI (acute kidney injury) (HCC)- this may be due to dehydration, her ua shows ketones and sp gravity over 1.03.  She is taking ace inhibitor and bblocker in the setting of likely poor intake.  However cannot rule out a component of infection.  Given 5 liters of ivf in the ED.  Still mildly hypotensive but better.  Hold all bp meds and nephrotoxic substances.  Obtain abd ultrasound complete in the am for both her AKI and liver evaluation for abnormal ammonia levels.  Active Problems:   Acute encephalopathy- I think this is mainly due to decreased clearance of her opiates in the setting of AKI.  Will lower her opiate dosing.    Hyperammonemia (HCC)- has mild transaminitis. Will obtain complete abd ultrasound    Carcinoma of breast  metastatic to bone (HCC)- noted    GAD (generalized anxiety disorder)- noteed   Transaminitis- noted    Hypotension- as above, with lactic acidosis and increased procalcitonin level of almost 2, will empirically cover with iv vanc and zosyn for possible sepsis until this has been ruled out or in.  Urine cx and blood cx have been ordered.  Lactic acidosis has resolved with ivf in the ED.    Cancer related pain- noted she is started to have significant amount of pain again, she normally takes oxy 20mg  q 4 hours.  Along with long acting ms contin 60mg  bid.  Will give her 10mg  of oral oxy now  and monitor for oversedation, adjust pain meds accordingly    Anemia of chronic disease- stable and at baseline     Admit to stepdown.  DVT prophylaxis: scds, lovenox  Code Status: full Family Communication: none Disposition Plan: per day team   Karver Fadden A MD [facility]  If 7PM-7AM, please contact night-coverage [email or web address] Password [email or web address]   Pt seen before midnight 10/28/2015, 12:13 AM

## 2015-10-28 NOTE — Progress Notes (Signed)
ANTIBIOTIC CONSULT NOTE-Preliminary  Pharmacy Consult for vancomycin, zosyn Indication: sepsis  Allergies  Allergen Reactions  . Bee Venom Anaphylaxis    Patient Measurements: Height: _0  (162.6 cm) Weight: 227 lb 4.7 oz (103.1 kg) IBW/kg (Calculated) : 54.7 Adjusted Body Weight:   Vital Signs: Temp: 98.2 F (36.8 C) (09/02 0010) Temp Source: Oral (09/02 0010) BP: 95/75 (09/01 2315) Pulse Rate: 100 (09/01 2315)  Labs:  Recent Labs  10/27/15 1330 10/27/15 1613  WBC  --  17.8*  HGB  --  10.6*  PLT  --  521*  CREATININE 2.41* 2.42*    Estimated Creatinine Clearance: 30.7 mL/min (by C-G formula based on SCr of 2.42 mg/dL).  No results for input(s): VANCOTROUGH, VANCOPEAK, VANCORANDOM, GENTTROUGH, GENTPEAK, GENTRANDOM, TOBRATROUGH, TOBRAPEAK, TOBRARND, AMIKACINPEAK, AMIKACINTROU, AMIKACIN in the last 72 hours.   Microbiology: Recent Results (from the past 720 hour(s))  Blood culture (routine x 2)     Status: None   Collection Time: 10/16/15  7:24 PM  Result Value Ref Range Status   Specimen Description BLOOD RIGHT HAND  Final   Special Requests BOTTLES DRAWN AEROBIC AND ANAEROBIC 6CC  Final   Culture NO GROWTH 5 DAYS  Final   Report Status 10/21/2015 FINAL  Final  Blood culture (routine x 2)     Status: None   Collection Time: 10/16/15  7:29 PM  Result Value Ref Range Status   Specimen Description BLOOD RIGHT HAND  Final   Special Requests BOTTLES DRAWN AEROBIC ONLY [place]  Final   Culture NO GROWTH 5 DAYS  Final   Report Status 10/21/2015 FINAL  Final  MRSA PCR Screening     Status: None   Collection Time: 10/17/15  6:12 AM  Result Value Ref Range Status   MRSA by PCR NEGATIVE NEGATIVE Final    Comment:        The GeneXpert MRSA Assay (FDA approved for NASAL specimens only), is one component of a comprehensive MRSA colonization surveillance program. It is not intended to diagnose MRSA infection nor to guide or monitor treatment for MRSA infections.    Culture, Urine     Status: None   Collection Time: 10/18/15  5:40 PM  Result Value Ref Range Status   Specimen Description URINE, CLEAN CATCH  Final   Special Requests NONE  Final   Culture NO GROWTH Performed at [facility]   Final   Report Status 10/20/2015 FINAL  Final  Culture, blood (routine x 2)     Status: None (Preliminary result)   Collection Time: 10/27/15 10:35 PM  Result Value Ref Range Status   Specimen Description BLOOD RIGHT HAND  Final   Special Requests BOTTLES DRAWN AEROBIC AND ANAEROBIC [place]  Final   Culture PENDING  Incomplete   Report Status PENDING  Incomplete  Culture, blood (routine x 2)     Status: None (Preliminary result)   Collection Time: 10/27/15 10:59 PM  Result Value Ref Range Status   Specimen Description BLOOD RIGHT HAND  Final   Special Requests BOTTLES DRAWN AEROBIC AND ANAEROBIC [place]  Final   Culture PENDING  Incomplete   Report Status PENDING  Incomplete    Medical History: Past Medical History:  Diagnosis Date  . Anxiety   . B12 deficiency 05/12/2015  . Breast cancer ([place]) dx'd 05/27/2010   chemo/xrt comp 01/2011  . Breast lump    left  . Carcinoma of breast metastatic to bone ([place]) 05/28/2010   This patient presented in  April with an invasive ductal carcinoma that had broken through the skin and was contaminated with MRSA. She was stage III. It was lower inner quadrant. It was receptor positive, HER-2/neu negative, and had a KI-67 of 75%.  She was treated with neoadjuvant chemotherapy. She was recently hospitalized (June, 2012,) with multiple superficial MRSA infections. These have all r  . Depression   . Genital herpes 08/04/2012  . Hematoma   . Herpes   . Hypertension   . Lymph edema   . MRSA (methicillin resistant Staphylococcus aureus)   . Multiple blisters    along surgical site  . PMB (postmenopausal bleeding) 06/23/2013  . Swelling    left foot  . Vaginal delivery 1981, 1984, 1987, 1992    Medications:   Prescriptions Prior to Admission  Medication Sig Dispense Refill Last Dose  . acyclovir (ZOVIRAX) 400 MG tablet Take 400 mg by mouth 2 (two) times daily.   5 10/27/2015 at Unknown time  . ALPRAZolam (XANAX) 1 MG tablet Take 0.5-1 mg by mouth 2 (two) times daily as needed for anxiety.   5 10/27/2015 at Unknown time  . amLODipine (NORVASC) 5 MG tablet Take 5 mg by mouth daily.   10/27/2015 at Unknown time  . clotrimazole (LOTRIMIN) 1 % cream Apply 1 application topically 2 (two) times daily as needed (yeast rash).   unknown  . cyclobenzaprine (FLEXERIL) 10 MG tablet Take 10 mg by mouth 3 (three) times daily as needed for muscle spasms.   2 10/26/2015 at Unknown time  . letrozole (FEMARA) 2.5 MG tablet Take 1 tablet (2.5 mg total) by mouth daily. (Patient taking differently: Take 2.5 mg by mouth every evening. ) 90 tablet 3 10/26/2015 at Unknown time  . lisinopril (PRINIVIL,ZESTRIL) 40 MG tablet TAKE 1 TABLET BY MOUTH ONCE DAILY (Patient taking differently: TAKE 1 TABLET BY MOUTH ONCE DAILY IN THE EVENING) 30 tablet 3 10/26/2015 at Unknown time  . LYRICA 75 MG capsule Take 75 mg by mouth 2 (two) times daily.   3 10/27/2015 at Unknown time  . metoprolol succinate (TOPROL-XL) 25 MG 24 hr tablet Take 25 mg by mouth every evening.   10/26/2015 at Unknown time  . metoprolol tartrate (LOPRESSOR) 25 MG tablet Take 25 mg by mouth 2 (two) times daily.     Marland Kitchen morphine (MS CONTIN) 60 MG 12 hr tablet Take 60 mg by mouth every 12 (twelve) hours.   0 10/27/2015 at Unknown time  . Oxycodone HCl 10 MG TABS Take 1 or 2 tablets every 4 hours to control pain. (Patient taking differently: Take 20 mg by mouth every 4 (four) hours. to control pain.) 150 tablet 0 10/27/2015 at 1200  . palbociclib (IBRANCE) 100 MG capsule Take 100 mg by mouth every evening. Take whole with food.   10/26/2015 at Unknown time  . temazepam (RESTORIL) 15 MG capsule TAKE 1 TO 2 CAPSULES BY MOUTH AT BEDTIME AS NEEDED FOR SLEEP 60 capsule 3 Past Week at Unknown time   . traZODone (DESYREL) 50 MG tablet Take 50 mg by mouth at bedtime.  6 10/26/2015 at Unknown time  . venlafaxine XR (EFFEXOR-XR) 75 MG 24 hr capsule TAKE 3 CAPSULES BY MOUTH DAILY (Patient taking differently: TAKE 3 CAPSULES BY MOUTH DAILY IN THE EVENING) 90 capsule 2 10/26/2015 at 1600time  . tetrahydrozoline-zinc (VISINE-AC) 0.05-0.25 % ophthalmic solution Place 2 drops into both eyes 3 (three) times daily as needed (for allergies).    unknown  . venlafaxine XR (EFFEXOR-XR) 75 MG  24 hr capsule Take 1 capsule (75 mg total) by mouth daily. (Patient not taking: Reported on 10/27/2015)   Not Taking at Unknown time   Scheduled:  . diatrizoate meglumine-sodium      . enoxaparin (LOVENOX) injection  0.5 mg/kg Subcutaneous Q24H  . piperacillin-tazobactam (ZOSYN)  IV  3.375 g Intravenous Q8H  . vancomycin  1,000 mg Intravenous Once   Infusions:  . 0.9 % NaCl with KCl 20 mEq / L     PRN: ondansetron **OR** ondansetron (ZOFRAN) IV Anti-infectives    Start     Dose/Rate Route Frequency Ordered Stop   10/28/15 0300  piperacillin-tazobactam (ZOSYN) IVPB 3.375 g     3.375 g 12.5 mL/hr over 240 Minutes Intravenous Every 8 hours 10/28/15 0020     10/28/15 0030  vancomycin (VANCOCIN) IVPB 1000 mg/200 mL premix     1,000 mg 200 mL/hr over 60 Minutes Intravenous  Once 10/28/15 0019     10/27/15 1815  vancomycin (VANCOCIN) IVPB 1000 mg/200 mL premix     1,000 mg 200 mL/hr over 60 Minutes Intravenous  Once 10/27/15 1810 10/27/15 2045   10/27/15 1815  piperacillin-tazobactam (ZOSYN) IVPB 3.375 g     3.375 g 100 mL/hr over 30 Minutes Intravenous  Once 10/27/15 1810 10/27/15 1935      Assessment:  55 yo c/o weakness and confusion. UA indicative of UTI. Starting vanc and zosyn for sepsis workup.   Goal of Therapy:  Vancomycin trough level 15-20 mcg/ml  Plan:  Preliminary review of pertinent patient information completed.  Protocol will be initiated with a one-time dose(s) of vancomycin 1 gram in  addition to the 1 gram she received in the ED for 2 gram load.  Starting Zosyn 3.375 grams Q8 hours at 0300, 8 hours after ED dose.   Forestine Na clinical pharmacist will complete review during morning rounds to assess patient and finalize treatment regimen.  Nyra Capes, [facility] 10/28/2015,12:29 AM

## 2015-10-29 DIAGNOSIS — G9340 Encephalopathy, unspecified: Secondary | ICD-10-CM

## 2015-10-29 DIAGNOSIS — G893 Neoplasm related pain (acute) (chronic): Secondary | ICD-10-CM

## 2015-10-29 DIAGNOSIS — N179 Acute kidney failure, unspecified: Principal | ICD-10-CM

## 2015-10-29 LAB — COMPREHENSIVE METABOLIC PANEL
ALBUMIN: 2.1 g/dL — AB (ref 3.5–5.0)
ALT: 47 U/L (ref 14–54)
AST: 56 U/L — AB (ref 15–41)
Alkaline Phosphatase: 230 U/L — ABNORMAL HIGH (ref 38–126)
Anion gap: 11 (ref 5–15)
BUN: 15 mg/dL (ref 6–20)
CHLORIDE: 107 mmol/L (ref 101–111)
CO2: 19 mmol/L — AB (ref 22–32)
CREATININE: 1.95 mg/dL — AB (ref 0.44–1.00)
Calcium: 6.7 mg/dL — ABNORMAL LOW (ref 8.9–10.3)
GFR calc Af Amer: 32 mL/min — ABNORMAL LOW (ref >60)
GFR, EST NON AFRICAN AMERICAN: 28 mL/min — AB (ref >60)
GLUCOSE: 129 mg/dL — AB (ref 65–99)
POTASSIUM: 3 mmol/L — AB (ref 3.5–5.1)
SODIUM: 137 mmol/L (ref 135–145)
Total Bilirubin: 0.8 mg/dL (ref 0.3–1.2)
Total Protein: 5.8 g/dL — ABNORMAL LOW (ref 6.5–8.1)

## 2015-10-29 LAB — CBC WITH DIFFERENTIAL/PLATELET
BASOS PCT: 0 %
Basophils Absolute: 0 10*3/uL (ref 0.0–0.1)
Eosinophils Absolute: 0.1 10*3/uL (ref 0.0–0.7)
Eosinophils Relative: 1 %
HEMATOCRIT: 24.9 % — AB (ref 36.0–46.0)
Hemoglobin: 8.3 g/dL — ABNORMAL LOW (ref 12.0–15.0)
LYMPHS ABS: 0.5 10*3/uL — AB (ref 0.7–4.0)
Lymphocytes Relative: 4 %
MCH: 29 pg (ref 26.0–34.0)
MCHC: 33.3 g/dL (ref 30.0–36.0)
MCV: 87.1 fL (ref 78.0–100.0)
MONO ABS: 1.4 10*3/uL — AB (ref 0.1–1.0)
MONOS PCT: 11 %
NEUTROS ABS: 10.8 10*3/uL — AB (ref 1.7–7.7)
Neutrophils Relative %: 84 %
PLATELETS: 449 10*3/uL — AB (ref 150–400)
RBC: 2.86 MIL/uL — AB (ref 3.87–5.11)
RDW: 15.6 % — AB (ref 11.5–15.5)
WBC: 12.8 10*3/uL — ABNORMAL HIGH (ref 4.0–10.5)

## 2015-10-29 LAB — PROCALCITONIN: PROCALCITONIN: 2.09 ng/mL

## 2015-10-29 NOTE — Progress Notes (Signed)
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 She is clearly better now. She conversed meaningfully, and maintained stable hemodynamics.  Cr is improving as well.   K is being repleted.  Cultures were negative thus far.   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 AKI:  Improving.  Will continue with IVF.  Avoid nephrotoxic drugs.  Will d/C Van/Zosyn today. As soon as her AKI resolves, we will d/c her home.  2. Sepsis:  I don't think it was sepsis.  She likely was volume depleted.  Cultures were negative.  Will d/c all antibiotics today. 3. Volume depletion:  Correcting.  She is feeling better.  4.   Acute encephalopathy:  Suspect narcotics induced.  She pain meds have been resume.  We will be cautious.  DVT prophylaxis: SCD Code Status: FULL CODE.  Family Communication: None.  Disposition Plan: D/C home when appropriate.   Consultants:   None.   Procedures:   None.   Antimicrobials: Anti-infectives    Start     Dose/Rate Route Frequency Ordered Stop   10/28/15 0300  piperacillin-tazobactam (ZOSYN) IVPB 3.375 g     3.375 g 12.5 mL/hr over 240 Minutes Intravenous Every 8 hours 10/28/15 0020     10/28/15 0030  vancomycin (VANCOCIN) IVPB 1000 mg/200 mL premix     1,000 mg 200 mL/hr over 60 Minutes Intravenous  Once 10/28/15 0019 10/28/15 0200   10/27/15 1815  vancomycin  (VANCOCIN) IVPB 1000 mg/200 mL premix     1,000 mg 200 mL/hr over 60 Minutes Intravenous  Once 10/27/15 1810 10/27/15 2045   10/27/15 1815  piperacillin-tazobactam (ZOSYN) IVPB 3.375 g     3.375 g 100 mL/hr over 30 Minutes Intravenous  Once 10/27/15 1810 10/27/15 1935       Subjective: I feel fine.    Objective: Vitals:   10/28/15 0800 10/28/15 1405 10/28/15 2315 10/29/15 0456  BP: 105/69 (!) 103/53 (!) 105/54 125/70  Pulse: (!) 120 (!) 121 (!) 119 (!) 125  Resp: 16 16 15 18   Temp:  (!) 100.4 F (38 C) (!) 100.6 F (38.1 C) 99.4 F (37.4 C)  TempSrc:  Oral Oral Oral  SpO2: 99% 100% 96% 96%  Weight:    104.8 kg (231 lb 0.7 oz)  Height:        Intake/Output Summary (Last 24 hours) at 10/29/15 1003 Last data filed at 10/29/15 0600  Gross per 24 hour  Intake             1125 ml  Output              500 ml  Net              625 ml   Filed Weights   10/28/15 0010 10/28/15 0400 10/29/15 0456  Weight: 103.1 kg (227 lb 4.7 oz) 103.5 kg (228 lb  2.8 oz) 104.8 kg (231 lb 0.7 oz)    Examination:  General exam: Appears calm and comfortable  Respiratory system: Clear to auscultation. Respiratory effort normal. Cardiovascular system: S1 & S2 heard, RRR. No JVD, murmurs, rubs, gallops or clicks. No pedal edema. Gastrointestinal system: Abdomen is nondistended, soft and nontender. No organomegaly or masses felt. Normal bowel sounds heard. Central nervous system: Alert and oriented. No focal neurological deficits. Extremities: Symmetric 5 x 5 power. Skin: No rashes, lesions or ulcers Psychiatry: Judgement and insight appear normal. Mood & affect appropriate.   Data Reviewed: I have personally reviewed following labs and imaging studies  CBC:  Recent Labs Lab 10/24/15 0618 10/27/15 1613 10/28/15 0431 10/29/15 0446  WBC 15.4* 17.8* 14.4* 12.8*  NEUTROABS  --  14.8*  --  10.8*  HGB 12.0 10.6* 10.2* 8.3*  HCT 35.0* 32.9* 32.0* 24.9*  MCV 87.1 89.4 92.8 87.1  PLT 516* 521*  373 449*   Basic Metabolic Panel:  Recent Labs Lab 10/24/15 0618 10/27/15 1330 10/27/15 1613 10/27/15 2259 10/28/15 0431 10/29/15 0446  NA 138 134* 134*  --  135 137  K 3.5 2.9* 3.1*  --  3.3* 3.0*  CL 106 101 99*  --  107 107  CO2 20* 20* 20*  --  17* 19*  GLUCOSE 110* 128* 113*  --  90 129*  BUN 10 17 17   --  19 15  CREATININE 0.44 2.41* 2.42*  --  2.21* 1.95*  CALCIUM 8.2* 7.9* 8.1*  --  6.9* 6.7*  MG  --   --   --  2.0  --   --    GFR: Estimated Creatinine Clearance: 38.4 mL/min (by C-G formula based on SCr of 1.95 mg/dL). Liver Function Tests:  Recent Labs Lab 10/27/15 1330 10/27/15 1613 10/28/15 0431 10/29/15 0446  AST 49* 53* 53* 56*  ALT 56* 57* 47 47  ALKPHOS 232* 241* 209* 230*  BILITOT 0.9 0.9 0.8 0.8  PROT 7.2 7.6 6.4* 5.8*  ALBUMIN 2.7* 3.0* 2.6* 2.1*    Recent Labs Lab 10/27/15 2042  AMMONIA 65*   Coagulation Profile:  Recent Labs Lab 10/27/15 2259  INR 1.78   Cardiac Enzymes: CBG:  Recent Labs Lab 10/27/15 2114  GLUCAP 120*   Thyroid Function Tests:  Recent Labs  10/27/15 2259  TSH 0.887   Anemia Panel: Sepsis Labs:  Recent Labs Lab 10/27/15 1654 10/27/15 2042 10/27/15 2258 10/27/15 2325 10/28/15 0431 10/29/15 0446  PROCALCITON  --  1.97  --   --   --  2.09  LATICACIDVEN 2.65*  --  1.2 1.19 1.7  --     Recent Results (from the past 240 hour(s))  Culture, blood (routine x 2)     Status: None (Preliminary result)   Collection Time: 10/27/15 10:35 PM  Result Value Ref Range Status   Specimen Description BLOOD RIGHT HAND  Final   Special Requests BOTTLES DRAWN AEROBIC AND ANAEROBIC 6CC EACH  Final   Culture NO GROWTH 2 DAYS  Final   Report Status PENDING  Incomplete  Culture, blood (routine x 2)     Status: None (Preliminary result)   Collection Time: 10/27/15 10:59 PM  Result Value Ref Range Status   Specimen Description BLOOD RIGHT HAND  Final   Special Requests BOTTLES DRAWN AEROBIC AND ANAEROBIC 6CC EACH   Final   Culture NO GROWTH 2 DAYS  Final   Report Status PENDING  Incomplete  MRSA PCR Screening  Status: None   Collection Time: 10/27/15 11:57 PM  Result Value Ref Range Status   MRSA by PCR NEGATIVE NEGATIVE Final    Comment:        The GeneXpert MRSA Assay (FDA approved for NASAL specimens only), is one component of a comprehensive MRSA colonization surveillance program. It is not intended to diagnose MRSA infection nor to guide or monitor treatment for MRSA infections.      Radiology Studies: US Abdomen Complete  Result Date: 10/28/2015 CLINICAL DATA:  Sepsis. Acute kidney injury. LFTs elevated on admission yesterday. History of stage IV breast cancer with bone metastases. Hepatic steatosis. EXAM: ABDOMEN ULTRASOUND COMPLETE COMPARISON:  CT 03/31/2015 FINDINGS: Gallbladder: Gallbladder has a normal appearance. Gallbladder wall is 2.3 mm, within normal limits. No stones or pericholecystic fluid. No sonographic Murphy's sign. Common bile duct: Diameter: 3.0 mm Liver: The liver is echogenic. There is attenuation of the ultrasound wave, poor visualization of the internal hepatic architecture, and loss of definition of the diaphragm. No focal liver lesions are identified. IVC: No abnormality visualized. Pancreas: Visualized portion unremarkable. Spleen: Size and appearance within normal limits. Right Kidney: Length: 13.1 cm. Echogenicity within normal limits. No mass or hydronephrosis visualized. Left Kidney: Length: 13.7 cm. Echogenicity within normal limits. No mass or hydronephrosis visualized. Abdominal aorta: No aneurysm visualized. Distal abdominal aorta and bifurcation are obscured by bowel gas. Other findings: None. IMPRESSION: 1. No evidence for acute cholecystitis. 2. Hepatic steatosis. 3. No hydronephrosis. Electronically Signed   By: Norva Pavlov M.D.   On: 10/28/2015 11:29   Dg Chest Portable 1 View  Result Date: 10/27/2015 CLINICAL DATA:  Altered mental status and sepsis.  EXAM: PORTABLE CHEST 1 VIEW COMPARISON:  10/16/2015 radiograph FINDINGS: Cardiomegaly and mild pulmonary vascular congestion noted. There is no evidence of focal airspace disease, pulmonary edema, suspicious pulmonary nodule/mass, pleural effusion, or pneumothorax. No acute bony abnormalities are identified. IMPRESSION: Cardiomegaly with mild pulmonary vascular congestion. Electronically Signed   By: Harmon Pier M.D.   On: 10/27/2015 21:19    Scheduled Meds: . enoxaparin (LOVENOX) injection  0.5 mg/kg Subcutaneous Q24H  . piperacillin-tazobactam (ZOSYN)  IV  3.375 g Intravenous Q8H   Continuous Infusions: . dextrose 5 % and 0.9 % NaCl with KCl 20 mEq/L 50 mL/hr at 10/28/15 0830     LOS: 2 days   Malaya Cagley, MD [facility].   If 7PM-7AM, please contact night-coverage [email or web address] Password [email or web address] 10/29/2015, 10:03 AM

## 2015-10-30 DIAGNOSIS — D638 Anemia in other chronic diseases classified elsewhere: Secondary | ICD-10-CM

## 2015-10-30 LAB — BASIC METABOLIC PANEL
ANION GAP: 11 (ref 5–15)
BUN: 10 mg/dL (ref 6–20)
CHLORIDE: 105 mmol/L (ref 101–111)
CO2: 21 mmol/L — ABNORMAL LOW (ref 22–32)
Calcium: 6.8 mg/dL — ABNORMAL LOW (ref 8.9–10.3)
Creatinine, Ser: 1.64 mg/dL — ABNORMAL HIGH (ref 0.44–1.00)
GFR calc Af Amer: 40 mL/min — ABNORMAL LOW (ref >60)
GFR, EST NON AFRICAN AMERICAN: 34 mL/min — AB (ref >60)
GLUCOSE: 127 mg/dL — AB (ref 65–99)
POTASSIUM: 3.1 mmol/L — AB (ref 3.5–5.1)
Sodium: 137 mmol/L (ref 135–145)

## 2015-10-30 LAB — ABO/RH: ABO/RH(D): O POS

## 2015-10-30 LAB — PREPARE RBC (CROSSMATCH)

## 2015-10-30 LAB — HEMOGLOBIN AND HEMATOCRIT, BLOOD
HCT: 30.1 % — ABNORMAL LOW (ref 36.0–46.0)
HEMOGLOBIN: 10.2 g/dL — AB (ref 12.0–15.0)

## 2015-10-30 LAB — URINE CULTURE: Culture: NO GROWTH

## 2015-10-30 MED ORDER — SODIUM CHLORIDE 0.9 % IV SOLN: Freq: Once | INTRAVENOUS | Status: DC

## 2015-10-30 MED ORDER — LACTULOSE 10 GM/15ML PO SOLN
10.0000 g | Freq: Every day | ORAL | Status: DC
  Administered 2015-10-30 – 2015-10-31 (×2): 10 g via ORAL
  Filled 2015-10-30 (×2): qty 30

## 2015-10-30 MED ORDER — PHYTONADIONE 5 MG PO TABS
10.0000 mg | ORAL_TABLET | Freq: Every day | ORAL | Status: AC
  Administered 2015-10-30 – 2015-10-31 (×2): 10 mg via ORAL
  Filled 2015-10-30 (×2): qty 2

## 2015-10-30 MED ORDER — METOPROLOL TARTRATE 5 MG/5ML IV SOLN
5.0000 mg | Freq: Once | INTRAVENOUS | Status: AC
  Administered 2015-10-30: 5 mg via INTRAVENOUS
  Filled 2015-10-30: qty 5

## 2015-10-30 MED ORDER — BISACODYL 5 MG PO TBEC
5.0000 mg | DELAYED_RELEASE_TABLET | Freq: Every day | ORAL | Status: DC | PRN
  Administered 2015-10-30: 5 mg via ORAL
  Filled 2015-10-30: qty 1

## 2015-10-30 NOTE — Progress Notes (Signed)
2320 f/u BP-137/85, P-102 after one time dose Metoprolol 5mg  IV, VS improved.

## 2015-10-30 NOTE — Plan of Care (Addendum)
Pt lost IV access during blood transfusion. Per RN, she didn't get much of the transfusion. Hgb was 8.3 this am, but attending noted symptomatic anemia in chart. NP spoke with RN. Pt is c/o getting stuck so many times. She has a restricted arm, so we are limited. A PICC can not be placed tonight. Per RN, she is eating and drinking. Her VSS and she has no complaints of dizziness at present. Therefore, unless she decompensates tonight, we will hold off on central line placement. RN said, if pt will allow, someone else is going to try to find PIV. NP reviewed note. Antibiotics were d/c'd today. IVF to help improve AKI and creatinine was down today. Discussed with RN, to push po fluids tonight. If we get PIV, will continue IVF and blood. If not, will get labs in am and reevaluate plan of care. Attending note from today actually mentions sending the pt home on 9/5 if creatinine is improved. KJKG, NP [facility] Update: RN able to find small PIV. Will continue blood and IVFs until unable. KJKG, NP [facility]

## 2015-10-30 NOTE — Progress Notes (Signed)
1950 Checked on patient's IV status d/t nurse reporting to this writer that the patient's RIGHT AC IV line was beeping & noted leaking. Nurse reported that she disconnect IV line that was currently running blood transfusion d/t blood leaking from RIGHT AC IV site. RIGHT AC IV site examined by this writer and IV catheter noted bent and not patent. IV catheter removed. IV access attempted in RIGHT FA but unsuccessful. Patient stated "It's bad every time I come here. They have abused that arm to hell." No further attempts for IV access made at this time. AC notified and made aware of patient's loss of IV access and c/o being stuck multiple times to her RIGHT arm. Patient has restricted LEFT arm d/t prior LEFT mastectomy. MD notified with request for PICC line placement order.

## 2015-10-30 NOTE — Care Management Note (Addendum)
Case Management Note  Patient Details  Name: Brittany Blair MRN: [id number] Date of Birth: June 14, 1960  Subjective/Objective: Patient adm with AKI, alert and oriented at this time, much improved mental status compared to last admission recently. She lives with husband, states she has a cane at home. Medicaid pending.                Action/Plan: Anticipate DC home with self care.   Expected Discharge Date:                  Expected Discharge Plan:  Home/Self Care  In-House Referral:  NA  Discharge planning Services  CM Consult  Post Acute Care Choice:  NA Choice offered to:  NA  DME Arranged:    DME Agency:     HH Arranged:    HH Agency:     Status of Service:  Completed, signed off  If discussed at [employer] of Stay Meetings, dates discussed:    Additional Comments:  Laaibah Wartman, Chauncey Reading, RN 10/30/2015, 9:21 AM

## 2015-10-30 NOTE — Progress Notes (Signed)
2218 BP-162/90, P-124. Mid-level notified.

## 2015-10-30 NOTE — Progress Notes (Signed)
2021 Order received to insert PICC line urgent. [facility] called, spoke with Ginger who was made aware of need for urgent PICC line order. Patient information given and Ginger reported that she would call this Probation officer back with ETA of [facility] employee.

## 2015-10-30 NOTE — Progress Notes (Signed)
2036 received call from Catherine from [facility] who reported that no technician is available to come tonight to insert PICC line because it's after holiday hours that ended at 7pm tonight. Catherine reported that a technician could possibly arrive around 10:30am to 11:00am in the morning to insert PICC line. AC and mid-level made aware.  

## 2015-10-30 NOTE — Progress Notes (Signed)
While in the [facility] role I received a call from staff RN Irvona me that Brittany Blair had been stuck multiple times to no avail. Staff RN did inform me she had spoken with the attending physician and got an order for Vascular team to obtain IV access. Crystal stated upon calling [facility] they informed her  they did not have a staff nurse available to come out to the hospital at this time. She was told that the next available time would be after 10 am in the morning. I was able to gain  IV access to patient. A # 24 was placed in the RT forearm, blood was restarted.

## 2015-10-30 NOTE — Progress Notes (Signed)
PROGRESS NOTE    Brittany Blair  WUJ:[id number] DOB: 05-26-1960 DOA: 10/27/2015 PCP: No PCP Per Patient    Brief Narrative: 55 yo female with hx of metastatic breast CA to the bone, anxiety, depression, admitted for AKI, possible sepsis with hypotension, transient altered mental status, felt to be due to medication, started on IV Van/Zosyn, given IVF, and cultured. She is clearly better now. She conversed meaningfully, and maintained stable hemodynamics.  Cr is improving as well.   K is being repleted.  Cultures were negative thus far.   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 AKI:  Improving.  Will continue with IVF.  Avoid nephrotoxic drugs.  Will d/C Van/Zosyn today. As soon as her AKI resolves, we will d/c her home.  Her Cr is 1.64 today.  2. Sepsis:  I don't think it was sepsis.  She likely was volume depleted.  Cultures were negative.  Will d/c all antibiotics today. 3. Volume depletion:  Correcting.  She is feeling better.            4.   Acute encephalopathy:  Suspect narcotics induced.  She pain meds have been resume.  We will be cautious.           5.   Elevated INR:   Suspect low Vit K.  Will supplement and follow up tomorrow.  Could be from liver failure.  With her                               elevated NH3 as well.  Will start her on Lactulose.  Hold Lovenox for now.            6.   Anemia:  With her tachycardia, will transfuse 1 units of PRBC today.   DVT prophylaxis: SCD Code Status: FULL CODE.  Family Communication: None.  Disposition Plan: D/C home when appropriate.   Consultants:   None.   Procedures:   None.   Antimicrobials: Anti-infectives    Start     Dose/Rate  Route Frequency Ordered Stop   10/28/15 0300  piperacillin-tazobactam (ZOSYN) IVPB 3.375 g     3.375 g 12.5 mL/hr over 240 Minutes Intravenous Every 8 hours 10/28/15 0020     10/28/15 0030  vancomycin (VANCOCIN) IVPB 1000 mg/200 mL premix     1,000 mg 200 mL/hr over 60 Minutes Intravenous  Once 10/28/15 0019 10/28/15 0200   10/27/15 1815  vancomycin (VANCOCIN) IVPB 1000 mg/200 mL premix     1,000 mg 200 mL/hr over 60 Minutes Intravenous  Once 10/27/15 1810 10/27/15 2045   10/27/15 1815  piperacillin-tazobactam (ZOSYN) IVPB 3.375 g     3.375 g 100 mL/hr over 30 Minutes Intravenous  Once 10/27/15 1810 10/27/15 1935       Subjective: Feeling well  Objective: Vitals:   10/29/15 0456 10/29/15 1426 10/29/15 2148 10/30/15 0554  BP: 125/70 (!) 143/81 140/80 (!) 170/93  Pulse: (!) 125 (!) 125 (!) 124 (!) 115  Resp: 18 20 20 17   Temp: 99.4 F (37.4 C) (!) 100.6 F (38.1 C) 100.3 F (37.9 C) 97.6 F (36.4 C)  TempSrc: Oral Oral Oral Oral  SpO2: 96% 100% 98% 100%  Weight: 104.8 kg (231 lb 0.7 oz)   103.9 kg (229 lb 0.9 oz)  Height:        Intake/Output Summary (Last 24 hours) at 10/30/15 0940 Last data filed at 10/30/15 0500  Gross per 24 hour  Intake             1440 ml  Output                0 ml  Net             1440 ml   Filed Weights   10/28/15 0400 10/29/15 0456 10/30/15 0554  Weight: 103.5 kg (228 lb 2.8 oz) 104.8 kg (231 lb 0.7 oz) 103.9 kg (229 lb 0.9 oz)    Examination:  General exam: Appears calm and comfortable  Respiratory system: Clear to auscultation. Respiratory effort normal. Cardiovascular system: S1 & S2 heard, RRR. No JVD, murmurs, rubs, gallops or clicks. No pedal edema. Gastrointestinal system: Abdomen is nondistended, soft and nontender. No organomegaly or masses felt. Normal bowel sounds heard. Central nervous system: Alert and oriented. No focal neurological deficits. Extremities: Symmetric 5 x 5 power. Skin: No rashes, lesions or  ulcers Psychiatry: Judgement and insight appear normal. Mood & affect appropriate.   Data Reviewed: I have personally reviewed following labs and imaging studies  CBC:  Recent Labs Lab 10/24/15 0618 10/27/15 1613 10/28/15 0431 10/29/15 0446  WBC 15.4* 17.8* 14.4* 12.8*  NEUTROABS  --  14.8*  --  10.8*  HGB 12.0 10.6* 10.2* 8.3*  HCT 35.0* 32.9* 32.0* 24.9*  MCV 87.1 89.4 92.8 87.1  PLT 516* 521* 373 449*   Basic Metabolic Panel:  Recent Labs Lab 10/27/15 1330 10/27/15 1613 10/27/15 2259 10/28/15 0431 10/29/15 0446 10/30/15 0843  NA 134* 134*  --  135 137 137  K 2.9* 3.1*  --  3.3* 3.0* 3.1*  CL 101 99*  --  107 107 105  CO2 20* 20*  --  17* 19* 21*  GLUCOSE 128* 113*  --  90 129* 127*  BUN 17 17  --  19 15 10   CREATININE 2.41* 2.42*  --  2.21* 1.95* 1.64*  CALCIUM 7.9* 8.1*  --  6.9* 6.7* 6.8*  MG  --   --  2.0  --   --   --    GFR: Estimated Creatinine Clearance: 45.5 mL/min (by C-G formula based on SCr of 1.64 mg/dL). Liver Function Tests:  Recent Labs Lab 10/27/15 1330 10/27/15 1613 10/28/15 0431 10/29/15 0446  AST 49* 53* 53* 56*  ALT 56* 57* 47 47  ALKPHOS 232* 241* 209* 230*  BILITOT 0.9 0.9 0.8 0.8  PROT 7.2 7.6 6.4* 5.8*  ALBUMIN 2.7* 3.0* 2.6* 2.1*    Recent Labs Lab 10/27/15 2042  AMMONIA 65*   Coagulation Profile:  Recent Labs Lab 10/27/15 2259  INR 1.78   CBG:  Recent Labs Lab 10/27/15 2114  GLUCAP 120*   Lipid Profile: Thyroid Function Tests:  Recent Labs  10/27/15 2259  TSH 0.887   Sepsis Labs:  Recent Labs Lab 10/27/15 1654 10/27/15 2042 10/27/15 2258 10/27/15 2325 10/28/15 0431 10/29/15 0446  PROCALCITON  --  1.97  --   --   --  2.09  LATICACIDVEN 2.65*  --  1.2 1.19 1.7  --     Recent Results (from the past 240 hour(s))  Culture, blood (routine x 2)     Status:  None (Preliminary result)   Collection Time: 10/27/15 10:35 PM  Result Value Ref Range Status   Specimen Description BLOOD RIGHT HAND   Final   Special Requests BOTTLES DRAWN AEROBIC AND ANAEROBIC 6CC EACH  Final   Culture NO GROWTH 2 DAYS  Final   Report Status PENDING  Incomplete  Culture, blood (routine x 2)     Status: None (Preliminary result)   Collection Time: 10/27/15 10:59 PM  Result Value Ref Range Status   Specimen Description BLOOD RIGHT HAND  Final   Special Requests BOTTLES DRAWN AEROBIC AND ANAEROBIC 6CC EACH  Final   Culture NO GROWTH 2 DAYS  Final   Report Status PENDING  Incomplete  MRSA PCR Screening     Status: None   Collection Time: 10/27/15 11:57 PM  Result Value Ref Range Status   MRSA by PCR NEGATIVE NEGATIVE Final    Comment:        The GeneXpert MRSA Assay (FDA approved for NASAL specimens only), is one component of a comprehensive MRSA colonization surveillance program. It is not intended to diagnose MRSA infection nor to guide or monitor treatment for MRSA infections.      Radiology Studies: US Abdomen Complete  Result Date: 10/28/2015 CLINICAL DATA:  Sepsis. Acute kidney injury. LFTs elevated on admission yesterday. History of stage IV breast cancer with bone metastases. Hepatic steatosis. EXAM: ABDOMEN ULTRASOUND COMPLETE COMPARISON:  CT 03/31/2015 FINDINGS: Gallbladder: Gallbladder has a normal appearance. Gallbladder wall is 2.3 mm, within normal limits. No stones or pericholecystic fluid. No sonographic Murphy's sign. Common bile duct: Diameter: 3.0 mm Liver: The liver is echogenic. There is attenuation of the ultrasound wave, poor visualization of the internal hepatic architecture, and loss of definition of the diaphragm. No focal liver lesions are identified. IVC: No abnormality visualized. Pancreas: Visualized portion unremarkable. Spleen: Size and appearance within normal limits. Right Kidney: Length: 13.1 cm. Echogenicity within normal limits. No mass or hydronephrosis visualized. Left Kidney: Length: 13.7 cm. Echogenicity within normal limits. No mass or hydronephrosis visualized.  Abdominal aorta: No aneurysm visualized. Distal abdominal aorta and bifurcation are obscured by bowel gas. Other findings: None. IMPRESSION: 1. No evidence for acute cholecystitis. 2. Hepatic steatosis. 3. No hydronephrosis. Electronically Signed   By: Norva Pavlov M.D.   On: 10/28/2015 11:29    Scheduled Meds: . enoxaparin (LOVENOX) injection  0.5 mg/kg Subcutaneous Q24H  . piperacillin-tazobactam (ZOSYN)  IV  3.375 g Intravenous Q8H   Continuous Infusions: . dextrose 5 % and 0.9 % NaCl with KCl 20 mEq/L 100 mL/hr at 10/29/15 1717     LOS: 3 days   Peggy Monk, MD [facility].   If 7PM-7AM, please contact night-coverage [email or web address] Password [email or web address] 10/30/2015, 9:40 AM

## 2015-10-30 NOTE — Progress Notes (Signed)
Late Entry 10/30/2015   Patient states that her current medication regimen is helping her pain.  MD was notified and per patient request she wanted the med interval time changed to q 4hrs PRN vs the 6.  MD stated due to her liver function the medication should stay the same.  I verbalized this to the patient and she verbalized understanding.

## 2015-10-31 LAB — TYPE AND SCREEN
ABO/RH(D): O POS
Antibody Screen: NEGATIVE
Unit division: 0

## 2015-10-31 LAB — BASIC METABOLIC PANEL
Anion gap: 13 (ref 5–15)
BUN: 10 mg/dL (ref 6–20)
CHLORIDE: 103 mmol/L (ref 101–111)
CO2: 20 mmol/L — ABNORMAL LOW (ref 22–32)
CREATININE: 1.26 mg/dL — AB (ref 0.44–1.00)
Calcium: 6.6 mg/dL — ABNORMAL LOW (ref 8.9–10.3)
GFR calc non Af Amer: 47 mL/min — ABNORMAL LOW (ref >60)
GFR, EST AFRICAN AMERICAN: 55 mL/min — AB (ref >60)
Glucose, Bld: 132 mg/dL — ABNORMAL HIGH (ref 65–99)
POTASSIUM: 2.8 mmol/L — AB (ref 3.5–5.1)
SODIUM: 136 mmol/L (ref 135–145)

## 2015-10-31 LAB — PROTIME-INR
INR: 1.29
Prothrombin Time: 16.2 seconds — ABNORMAL HIGH (ref 11.4–15.2)

## 2015-10-31 LAB — PROCALCITONIN: Procalcitonin: 1.66 ng/mL

## 2015-10-31 MED ORDER — BISACODYL 5 MG PO TBEC: 5.0000 mg | DELAYED_RELEASE_TABLET | Freq: Every day | ORAL | 0 refills | Status: AC | PRN

## 2015-10-31 MED ORDER — LACTULOSE 10 GM/15ML PO SOLN: 10.0000 g | Freq: Every day | ORAL | 0 refills | Status: AC

## 2015-10-31 MED ORDER — POTASSIUM CHLORIDE ER 10 MEQ PO TBCR: 40.0000 meq | EXTENDED_RELEASE_TABLET | Freq: Every day | ORAL | 1 refills | Status: AC

## 2015-10-31 MED FILL — oxyCODONE HCL 10 MG TABS: 10 | 13 days supply | Qty: 150 | Fill #0

## 2015-10-31 MED FILL — POTASSIUM CL ER 10 MEQ TAB: 10 | 7 days supply | Qty: 30 | Fill #0

## 2015-10-31 MED FILL — LACTULOSE 10 GM/15 ML SOLN: 10 | 16 days supply | Qty: 240 | Fill #0

## 2015-10-31 NOTE — Discharge Summary (Signed)
Physician Discharge Summary  Brittany Blair [id number] DOB: 1960-10-12 DOA: 10/27/2015  PCP: No PCP Per Patient  Admit date: 10/27/2015 Discharge date: 10/31/2015  Admitted From: Home Disposition:  To home.   Recommendations for Outpatient Follow-up:  1. Follow up with PCP in 1-2 weeks 2. Follow up with oncology as scheduled.   Home Health: None.  Patient did not qualify.  Equipment/Devices: None. Discharge Condition: alert, orient, converse meaningfully.  K of 2.8, INR corrected with Vit K.  Cr improved to 1.26 CODE STATUS:FULL CODE.  Diet recommendation: As tolerated.   Brief/Interim Summary:  Patient was admitted for AKI with elevated Cr from her oncology's office, admitted by Dr Shanon Brow on Sept 2, 2017.  As per her H and P:  "  Brittany Blair is a 55 y.o. 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  Pt had a recent hospitalization for uti and encephalopathy, was treated and d/c on 8/29 to home.  Her renal function today was abnormal and showed worsening AKI with a cr up to almost 3.  She was called and advised to go to the ED.  On arrival to the ED pt was mentating normally, and had no complaints.  However during the course of the first couple of hours she became more confused and somnulent.  She had been hypotensive with sbp in the mid 80s(however looking back at her prev bp she sometimes runs low) was give about 5 liters of ivf in the ED and her mental status continued to deteriorate with somnulence.  Her bp improved and all other vitals remained normal and pt was protecting her airway.  Narcan was intertained to be be given by me and dr Venora Maples in the ED but it was decided to hold off on that and closely monitor.  Over the next couple of hours she began to awaken and become more responsive.  At this time in the stepdown unit she is much more alert.  She  denies any recent illnesses.  She says she has been having a lot of nausea from her chemo treatments but no vomiting or diarrhea.  She denies any respiratory symptoms, no urinary symptoms and no fevers or rashes.  She denies taking any extra pills today.  She says however her pain has been much worse in the last couple of days with the change in weather.  Pt referred for admission for hypotension, AKI and possible sepsis vs dehydration.  Pt is waking up now and is in significant pain per her report.  HOSPITAL COURSE:  Patient was initially started on IV Van/Zosyn, as it was thought she may be septic with hypotension, tachycardia, and altered mental status.  Given her AKI, her Van/Zosyn was promptly discontinued, as soon as it was cleared she was not septic.  Her BC remained negative.  She was given IVF, and kept in the hsopital until her Cr numbers improved.  It went to 1.26 on the day of discharge.  Her nephrotoxic drugs were avoided.  She also was noted to be very lethargic and confused.  Effort was made to avoid polypharmacy on her, and her medication was adjusted.  She was seen by neurology as well.  Her Lyrica and muscle relaxant was discontinued.  Her Morphine long acting was discontinued as well.  She woke up, and became more alert, and conversed meaningfully, and asked to be discharged  home today.  Of note, her NH3 level was slightly elevated to 65, and she was started on some lactullose.  It will be good for her as she had some constipation also.  It was noted that her INR was elevated to 1.8, and vit K was given, in case iIt was dued to antibiotic associated phenomenon with killing Vit K producing gut bacteria.  Her Lovenox was discontinued.  Vit oral Vit K, her INR actually normalized.  She was noted to be anemic, and attempted transfusion was given, but she "blew" her IV site, and blood was stopped. Her Hb did rise to 10g per dL.  Her K was slightly low and will be supplemented.  Her husband was  updated as to everything went on in the hospital, and expressed agreement and appreciation.  She is now stable for discharge, and will be discharged to home today.  She will follow up with her PCP in one week, with her oncologist as schedule.  She did not qualify for HHA, as her insurance did not cover it.  Thank you for allowing me to participate in her care.  Good Day.     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  Avoid taking too much pain or anxiety medication.   Increase activity slowly    Complete by:  As directed       Medication List    STOP taking these medications   ALPRAZolam 1 MG tablet Commonly known as:  XANAX   cyclobenzaprine 10 MG tablet Commonly known as:  FLEXERIL   LYRICA 75 MG capsule Generic drug:  pregabalin   metoprolol tartrate 25 MG tablet Commonly known as:  LOPRESSOR   morphine 60 MG 12 hr tablet Commonly known as:  MS CONTIN   traZODone 50 MG tablet Commonly known as:  DESYREL     TAKE these medications   acyclovir 400 MG tablet Commonly known as:  ZOVIRAX Take 400 mg by mouth 2 (two) times daily.   amLODipine 5 MG tablet Commonly known as:  NORVASC Take 5 mg by mouth daily.   bisacodyl 5 MG EC tablet Commonly known as:  DULCOLAX Take 1 tablet (5 mg total) by mouth daily as needed for moderate constipation.   clotrimazole 1 % cream Commonly known as:  LOTRIMIN Apply 1 application topically 2 (two) times daily as needed (yeast rash).    IBRANCE 100 MG capsule Generic drug:  palbociclib Take 100 mg by mouth every evening. Take whole with food.   lactulose 10 GM/15ML solution Commonly known as:  CHRONULAC Take 15 mLs (10 g total) by mouth daily.   letrozole 2.5 MG tablet Commonly known as:  FEMARA Take 1 tablet (2.5 mg total) by mouth daily. What changed:  when to take this   lisinopril 40 MG tablet Commonly known as:  PRINIVIL,ZESTRIL TAKE 1 TABLET BY MOUTH ONCE DAILY What changed:  See the new instructions.  metoprolol succinate 25 MG 24 hr tablet Commonly known as:  TOPROL-XL Take 25 mg by mouth every evening.   Oxycodone HCl 10 MG Tabs Take 1 or 2 tablets every 4 hours to control pain. What changed:  how much to take  how to take this  when to take this  additional instructions   potassium chloride 10 MEQ tablet Commonly known as:  K-DUR Take 4 tablets (40 mEq total) by mouth daily.   temazepam 15 MG capsule Commonly known as:  RESTORIL TAKE 1 TO 2 CAPSULES BY MOUTH AT BEDTIME AS NEEDED FOR SLEEP   tetrahydrozoline-zinc 0.05-0.25 % ophthalmic solution Commonly known as:  VISINE-AC Place 2 drops into both eyes 3 (three) times daily as needed (for allergies).   venlafaxine XR 75 MG 24 hr capsule Commonly known as:  EFFEXOR-XR TAKE 3 CAPSULES BY MOUTH DAILY What changed:  See the new instructions.       Allergies  Allergen Reactions  . Bee Venom Anaphylaxis    Consultations:  None.    Procedures/Studies: Ct Head Wo Contrast  Result Date: 10/16/2015 CLINICAL DATA:  Metastatic breast cancer, confusion, decreased appetite x 3-4 days EXAM: CT HEAD WITHOUT CONTRAST TECHNIQUE: Contiguous axial images were obtained from the base of the skull through the vertex without intravenous contrast. COMPARISON:  02/16/2014 FINDINGS: Brain: No evidence of acute infarction, hemorrhage, hydrocephalus, extra-axial collection or mass lesion/mass effect. Vascular: No hyperdense vessel or unexpected  calcification. Skull: No evidence of calvarial fracture. Sinuses/Orbits: Layering fluid in the left sphenoid sinus. Visualized mastoid air cells are clear. Other: Mild subcortical white matter and periventricular small vessel ischemic changes. Mild cortical atrophy. No ventriculomegaly. IMPRESSION: No evidence of acute intracranial abnormality. Mild atrophy with small vessel ischemic changes. Electronically Signed   By: Julian Hy M.D.   On: 10/16/2015 18:38   Ct Head W & Wo Contrast  Result Date: 10/20/2015 CLINICAL DATA:  Acute encephalopathy. History of metastatic breast cancer EXAM: CT HEAD WITHOUT AND WITH CONTRAST TECHNIQUE: Contiguous axial images were obtained from the base of the skull through the vertex without and with intravenous contrast CONTRAST:  37mL ISOVUE-300 IOPAMIDOL (ISOVUE-300) INJECTION 61% COMPARISON:  MRI head 10/19/2015.  CT head 10/16/2015 FINDINGS: Brain: Generalized atrophy. Negative for acute or chronic infarction. Negative for hemorrhage or mass. No edema. No enhancing lesion identified postcontrast administration. Vascular: No hyperdense vessel or unexpected calcification. Skull: No lytic or sclerotic metastatic disease to the skull. Sinuses/Orbits: Air-fluid level sphenoid sinus unchanged. Remaining sinuses clear. Other: Negative IMPRESSION: No acute intracranial abnormality.  Negative for metastatic disease. Air-fluid level sphenoid sinus. Electronically Signed   By: Franchot Gallo M.D.   On: 10/20/2015 19:39   Mr Brain Wo Contrast  Result Date: 10/19/2015 CLINICAL DATA:  55 year old female with increased confusion. Stage IV breast cancer. Subsequent encounter. EXAM: MRI HEAD WITHOUT CONTRAST TECHNIQUE: Multiplanar, multiecho pulse sequences of the brain and surrounding structures were obtained without intravenous contrast. COMPARISON:  Head CT without contrast 10/16/2015 and earlier. FINDINGS: Study is mildly degraded by motion artifact despite repeated imaging  attempts. Major intracranial vascular flow voids are preserved. No restricted diffusion to suggest acute infarction. No midline shift, mass effect, evidence of mass lesion, ventriculomegaly, extra-axial collection or acute intracranial hemorrhage. Cervicomedullary junction and pituitary are within normal limits. No encephalomalacia. No chronic cerebral blood products. No contrast was administered, but no areas of cerebral edema are evident noncontrast gray and white matter signal is normal for age. Mildly heterogeneous bone marrow signal in the calvarium  and upper cervical spine, but no destructive osseous lesion identified. Fluid level in the sphenoid sinus. Other paranasal sinuses and mastoids are well pneumatized. Visible internal auditory structures appear normal. Negative orbit and scalp soft tissues. IMPRESSION: 1. No acute intracranial abnormality. Early metastatic disease to the brain is difficult to exclude in the absence of intravenous contrast. 2. Heterogeneous bone marrow signal which is nonspecific. No destructive osseous lesion identified. 3. Fluid level in the right sphenoid sinus suggesting sinusitis in the appropriate clinical setting. Electronically Signed   By: Genevie Ann M.D.   On: 10/19/2015 12:32   US Abdomen Complete  Result Date: 10/28/2015 CLINICAL DATA:  Sepsis. Acute kidney injury. LFTs elevated on admission yesterday. History of stage IV breast cancer with bone metastases. Hepatic steatosis. EXAM: ABDOMEN ULTRASOUND COMPLETE COMPARISON:  CT 03/31/2015 FINDINGS: Gallbladder: Gallbladder has a normal appearance. Gallbladder wall is 2.3 mm, within normal limits. No stones or pericholecystic fluid. No sonographic Murphy's sign. Common bile duct: Diameter: 3.0 mm Liver: The liver is echogenic. There is attenuation of the ultrasound wave, poor visualization of the internal hepatic architecture, and loss of definition of the diaphragm. No focal liver lesions are identified. IVC: No abnormality  visualized. Pancreas: Visualized portion unremarkable. Spleen: Size and appearance within normal limits. Right Kidney: Length: 13.1 cm. Echogenicity within normal limits. No mass or hydronephrosis visualized. Left Kidney: Length: 13.7 cm. Echogenicity within normal limits. No mass or hydronephrosis visualized. Abdominal aorta: No aneurysm visualized. Distal abdominal aorta and bifurcation are obscured by bowel gas. Other findings: None. IMPRESSION: 1. No evidence for acute cholecystitis. 2. Hepatic steatosis. 3. No hydronephrosis. Electronically Signed   By: Nolon Nations M.D.   On: 10/28/2015 11:29   Dg Chest Portable 1 View  Result Date: 10/27/2015 CLINICAL DATA:  Altered mental status and sepsis. EXAM: PORTABLE CHEST 1 VIEW COMPARISON:  10/16/2015 radiograph FINDINGS: Cardiomegaly and mild pulmonary vascular congestion noted. There is no evidence of focal airspace disease, pulmonary edema, suspicious pulmonary nodule/mass, pleural effusion, or pneumothorax. No acute bony abnormalities are identified. IMPRESSION: Cardiomegaly with mild pulmonary vascular congestion. Electronically Signed   By: Margarette Canada M.D.   On: 10/27/2015 21:19   Dg Chest Portable 1 View  Result Date: 10/16/2015 CLINICAL DATA:  Cancer metastatic to bone, right-sided hip pain. Confusion. History of left breast cancer. EXAM: PORTABLE CHEST 1 VIEW COMPARISON:  None. FINDINGS: Study is hypoinspiratory. Given the low lung volumes, in the slight patient obliquity, lungs appear clear. No evidence of pneumonia. No pleural effusion or pneumothorax seen. Heart size is upper normal, likely accentuated by the low lung volumes. Surgical clips are seen about the left axilla. Old healed rib fractures on the left were better seen on earlier CT. No evidence of acute osseous abnormality seen on today's chest x-ray. IMPRESSION: Low lung volumes.  No active disease seen. Electronically Signed   By: Franki Cabot M.D.   On: 10/16/2015 19:50     Subjective:  Help me get home.  I want to go home.   Discharge Exam: Vitals:   10/30/15 2320 10/31/15 0518  BP: 137/85 (!) 171/98  Pulse: (!) 102 (!) 120  Resp: 18 18  Temp: 98.6 F (37 C) 98.3 F (36.8 C)   Vitals:   10/30/15 2125 10/30/15 2216 10/30/15 2320 10/31/15 0518  BP: (!) 166/90 (!) 162/90 137/85 (!) 171/98  Pulse: (!) 106 (!) 124 (!) 102 (!) 120  Resp: 18  18 18   Temp: 98.6 F (37 C)  98.6 F (37  C) 98.3 F (36.8 C)  TempSrc: Oral  Oral Oral  SpO2:   97% 97%  Weight:    101.5 kg (223 lb 12.3 oz)  Height:        General: Pt is alert, awake, not in acute distress Cardiovascular: RRR, S1/S2 +, no rubs, no gallops Respiratory: CTA bilaterally, no wheezing, no rhonchi Abdominal: Soft, NT, ND, bowel sounds + Extremities: no edema, no cyanosis    The results of significant diagnostics from this hospitalization (including imaging, microbiology, ancillary and laboratory) are listed below for reference.     Microbiology: Recent Results (from the past 240 hour(s))  Culture, Urine     Status: None   Collection Time: 10/27/15  6:55 PM  Result Value Ref Range Status   Specimen Description URINE, RANDOM  Final   Special Requests NONE  Final   Culture NO GROWTH Performed at [facility]   Final   Report Status 10/30/2015 FINAL  Final  Culture, blood (routine x 2)     Status: None (Preliminary result)   Collection Time: 10/27/15 10:35 PM  Result Value Ref Range Status   Specimen Description BLOOD RIGHT HAND  Final   Special Requests BOTTLES DRAWN AEROBIC AND ANAEROBIC [place]  Final   Culture NO GROWTH 4 DAYS  Final   Report Status PENDING  Incomplete  Culture, blood (routine x 2)     Status: None (Preliminary result)   Collection Time: 10/27/15 10:59 PM  Result Value Ref Range Status   Specimen Description BLOOD RIGHT HAND  Final   Special Requests BOTTLES DRAWN AEROBIC AND ANAEROBIC 6CC EACH  Final   Culture NO GROWTH 4 DAYS  Final   Report  Status PENDING  Incomplete  MRSA PCR Screening     Status: None   Collection Time: 10/27/15 11:57 PM  Result Value Ref Range Status   MRSA by PCR NEGATIVE NEGATIVE Final    Comment:        The GeneXpert MRSA Assay (FDA approved for NASAL specimens only), is one component of a comprehensive MRSA colonization surveillance program. It is not intended to diagnose MRSA infection nor to guide or monitor treatment for MRSA infections.      Labs: Basic Metabolic Panel:  Recent Labs Lab 10/27/15 1613 10/27/15 2259 10/28/15 0431 10/29/15 0446 10/30/15 0843 10/31/15 0637  NA 134*  --  135 137 137 136  K 3.1*  --  3.3* 3.0* 3.1* 2.8*  CL 99*  --  107 107 105 103  CO2 20*  --  17* 19* 21* 20*  GLUCOSE 113*  --  90 129* 127* 132*  BUN 17  --  19 15 10 10   CREATININE 2.42*  --  2.21* 1.95* 1.64* 1.26*  CALCIUM 8.1*  --  6.9* 6.7* 6.8* 6.6*  MG  --  2.0  --   --   --   --    Liver Function Tests:  Recent Labs Lab 10/27/15 1330 10/27/15 1613 10/28/15 0431 10/29/15 0446  AST 49* 53* 53* 56*  ALT 56* 57* 47 47  ALKPHOS 232* 241* 209* 230*  BILITOT 0.9 0.9 0.8 0.8  PROT 7.2 7.6 6.4* 5.8*  ALBUMIN 2.7* 3.0* 2.6* 2.1*   No results for input(s): LIPASE, AMYLASE in the last 168 hours.  Recent Labs Lab 10/27/15 2042  AMMONIA 65*   CBC:  Recent Labs Lab 10/27/15 1613 10/28/15 0431 10/29/15 0446 10/30/15 2255  WBC 17.8* 14.4* 12.8*  --   NEUTROABS 14.8*  --  10.8*  --   HGB 10.6* 10.2* 8.3* 10.2*  HCT 32.9* 32.0* 24.9* 30.1*  MCV 89.4 92.8 87.1  --   PLT 521* 373 449*  --    CBG:  Recent Labs Lab 10/27/15 2114  GLUCAP 120*  Anemia work up No results for input(s): VITAMINB12, FOLATE, FERRITIN, TIBC, IRON, RETICCTPCT in the last 72 hours. Urinalysis    Component Value Date/Time   COLORURINE YELLOW 10/27/2015 1855   APPEARANCEUR CLEAR 10/27/2015 1855   LABSPEC >1.030 (H) 10/27/2015 1855   PHURINE 5.0 10/27/2015 1855   GLUCOSEU NEGATIVE 10/27/2015 1855    HGBUR LARGE (A) 10/27/2015 1855   BILIRUBINUR MODERATE (A) 10/27/2015 1855   KETONESUR TRACE (A) 10/27/2015 1855   PROTEINUR 100 (A) 10/27/2015 1855   UROBILINOGEN 0.2 10/26/2013 1942   NITRITE NEGATIVE 10/27/2015 1855   LEUKOCYTESUR TRACE (A) 10/27/2015 1855   Sepsis Labs Microbiology Recent Results (from the past 240 hour(s))  Culture, Urine     Status: None   Collection Time: 10/27/15  6:55 PM  Result Value Ref Range Status   Specimen Description URINE, RANDOM  Final   Special Requests NONE  Final   Culture NO GROWTH Performed at [facility]   Final   Report Status 10/30/2015 FINAL  Final  Culture, blood (routine x 2)     Status: None (Preliminary result)   Collection Time: 10/27/15 10:35 PM  Result Value Ref Range Status   Specimen Description BLOOD RIGHT HAND  Final   Special Requests BOTTLES DRAWN AEROBIC AND ANAEROBIC 6CC EACH  Final   Culture NO GROWTH 4 DAYS  Final   Report Status PENDING  Incomplete  Culture, blood (routine x 2)     Status: None (Preliminary result)   Collection Time: 10/27/15 10:59 PM  Result Value Ref Range Status   Specimen Description BLOOD RIGHT HAND  Final   Special Requests BOTTLES DRAWN AEROBIC AND ANAEROBIC 6CC EACH  Final   Culture NO GROWTH 4 DAYS  Final   Report Status PENDING  Incomplete  MRSA PCR Screening     Status: None   Collection Time: 10/27/15 11:57 PM  Result Value Ref Range Status   MRSA by PCR NEGATIVE NEGATIVE Final    Comment:        The GeneXpert MRSA Assay (FDA approved for NASAL specimens only), is one component of a comprehensive MRSA colonization surveillance program. It is not intended to diagnose MRSA infection nor to guide or monitor treatment for MRSA infections.      Time coordinating discharge: Over 30 minutes  SIGNED:   Orvan Falconer, MD FACP [facility] 10/31/2015, 10:39 AM   If 7PM-7AM, please contact night-coverage [email or web address] Password [email or web address]

## 2015-10-31 NOTE — H&P (Signed)
1008-I reviewed the chart and it looks like patient would be a good candidate for a midline. Dr. Marin Comment called and he stated that patient did not need the access today, plan was to discharge her home today. So we cancel procedure on site.

## 2015-10-31 NOTE — Progress Notes (Signed)
Patient discharged home.  IV removed - WNL.  Reviewed DC instructions and meds with husband.  Verbalized understanding.  No questions at this time, assisted off unit via WC in NAD.

## 2015-11-01 LAB — CULTURE, BLOOD (ROUTINE X 2)
CULTURE: NO GROWTH
Culture: NO GROWTH

## 2015-11-03 ENCOUNTER — Telehealth (HOSPITAL_COMMUNITY): Payer: Self-pay | Admitting: *Deleted

## 2015-11-06 NOTE — Telephone Encounter (Signed)
That is taken care of by primary care provider.  We do not place patients in nursing facilities. [name], PA-C 11/06/2015 4:45 PM

## 2015-11-07 MED FILL — TEMAZEPAM 15 MG CAPSULE: 15 | 30 days supply | Qty: 60 | Fill #2

## 2015-11-07 NOTE — Telephone Encounter (Signed)
Left message instructing them to go through PCP and to call if any further questions.

## 2015-11-13 ENCOUNTER — Other Ambulatory Visit (HOSPITAL_COMMUNITY): Payer: Self-pay | Admitting: Pharmacist

## 2015-11-15 ENCOUNTER — Encounter (HOSPITAL_COMMUNITY): Payer: Self-pay | Admitting: Oncology

## 2015-11-15 ENCOUNTER — Encounter (HOSPITAL_BASED_OUTPATIENT_CLINIC_OR_DEPARTMENT_OTHER): Payer: Self-pay | Admitting: Oncology

## 2015-11-15 DIAGNOSIS — Z170 Estrogen receptor positive status [ER+]: Secondary | ICD-10-CM

## 2015-11-15 DIAGNOSIS — I959 Hypotension, unspecified: Secondary | ICD-10-CM

## 2015-11-15 DIAGNOSIS — R627 Adult failure to thrive: Secondary | ICD-10-CM

## 2015-11-15 DIAGNOSIS — C50919 Malignant neoplasm of unspecified site of unspecified female breast: Secondary | ICD-10-CM

## 2015-11-15 DIAGNOSIS — C7951 Secondary malignant neoplasm of bone: Secondary | ICD-10-CM

## 2015-11-15 NOTE — Patient Instructions (Signed)
[place] at [facility] Discharge Instructions  RECOMMENDATIONS MADE BY THE CONSULTANT AND ANY TEST RESULTS WILL BE SENT TO YOUR REFERRING PHYSICIAN.  You were seen today by Kirby Crigler PA-C. He will refer you to Hospice. No follow up is needed. If you have questions or concerns please call us.   Thank you for choosing [place] at [facility] to provide your oncology and hematology care.  To afford each patient quality time with our provider, please arrive at least 15 minutes before your scheduled appointment time.   Beginning January 23rd 2017 lab work for the [facility] will be done in the  Main lab at [facility] on 1st floor. If you have a lab appointment with the [facility] please come in thru the  Main Entrance and check in at the main information desk  You need to re-schedule your appointment should you arrive 10 or more minutes late.  We strive to give you quality time with our providers, and arriving late affects you and other patients whose appointments are after yours.  Also, if you no show three or more times for appointments you may be dismissed from the clinic at the providers discretion.     Again, thank you for choosing [facility].  Our hope is that these requests will decrease the amount of time that you wait before being seen by our physicians.       _____________________________________________________________  Should you have questions after your visit to [facility], please contact our office at [phone] between the hours of 8:30 a.m. and 4:30 p.m.  Voicemails left after 4:30 p.m. will not be returned until the following business day.  For prescription refill requests, have your pharmacy contact our office.         Resources For Cancer Patients and their Caregivers ? American Cancer Society: Can assist with transportation, wigs, general needs, runs Look Good Feel Better.         213-210-0103 ? Cancer Care: Provides financial assistance, online support groups, medication/co-pay assistance.  1-800-813-HOPE 3343062106) ? [facility] Assists [place] cancer patients and their families through emotional , educational and financial support.  773 459 5234 ? [place] DSS Where to apply for food stamps, Medicaid and utility assistance. (713)539-5517 ? [facility]: Transportation to medical appointments. 480-664-6866 ? Social Security Administration: May apply for disability if have a Stage IV cancer. 828-585-5381 (352) 526-5658 ? [employer], Disability and Transit Services: Assists with nutrition, care and transit needs. [facility] Support Programs: @10RELATIVEDAYS @ > Cancer Support Group  2nd Tuesday of the month 1pm-2pm, Journey Room  > Creative Journey  3rd Tuesday of the month 1130am-1pm, Journey Room  > Look Good Feel Better  1st Wednesday of the month 10am-12 noon, Journey Room (Call [place] to register [phone])

## 2015-11-15 NOTE — Progress Notes (Signed)
No PCP Per Patient No address on file  Carcinoma of breast metastatic to bone, unspecified laterality ([place])  CURRENT THERAPY: Ibrance 100 mg daily 3/1 fashion plus letrozole daily. Xgeva beginning on 01/17/2014.  INTERVAL HISTORY: Brittany Blair 55 y.o. female returns for followup of Metastatic breast cancer to bone after undergoing an iliac bone biopsy on 12/08/2013 demonstrating an ER/PR +, Her2 negative breast cancer. She was started on Ibrance plus letrozole will be given with ibrance 100 mg daily for 3 weeks out of every 4 and letrozole 2.5 mg daily.    Carcinoma of breast metastatic to bone ([place])   10/14/2013 Surgery    laparoscopy assisted vaginal hysterectomy, laparoscopic bilateral salpingectomy, and cystoscopy       12/08/2013 Progression    Iliac bone biopsy consistent with adenocarcinoma, ER/PR positive, HER-2/neu not overexpressed      12/23/2013 -  Chemotherapy    Ibrance 100 mg daily for 3 weeks out of every 4 and letrozole 2.5 mg daily.      01/17/2014 Miscellaneous    Xgeva 120 mg SQ every 28 days for bone metastases.      09/16/2014 Imaging    Stable exam. Osseous metastatic disease is seen on the mostbrecent comparison study without substantial interval change.  No evidence for soft tissue metastases in the chest, abdomen, or pelvis.      09/22/2014 Imaging    Bone scan- Stable abnormal uptake the medial aspect of the left clavicle, lateral aspects of 2 mid left ribs, and in the medial aspect of the left iliac bone. Slightly increased conspicuity of activity in the left inferior pubic ramus with new increase...      03/23/2015 Imaging    Bone scan- Findings are stable compared to the nuclear medicine bone scan of 09/22/2014, as detailed above, compatible with the osseous metastases described on previous CT reports.  No new abnormality.      03/31/2015 Imaging    CT CAP- Unchanged skeletal metastatic disease, as discussed above.  No signs of new  extraskeletal metastatic disease identified on today's examination in the chest, abdomen or pelvis.      10/16/2015 - 10/24/2015 Hospital Admission    Admit date: 10/16/2015 Admission diagnosis: 10/24/2015 Additional comments: UTI (lower urinary tract infection), Hyponatremia, Acute encephalopathy, Polypharmacy, Altered mental status      10/19/2015 Imaging    MRI brain- No acute intracranial abnormality. Early metastatic disease to the brain is difficult to exclude in the absence of intravenous contrast.      10/20/2015 Imaging    CT head- No acute intracranial abnormality.  Negative for metastatic disease.      10/27/2015 - 10/31/2015 Hospital Admission    Admit date: 10/27/2015 Admission diagnosis: 10/31/2015 Additional comments: AKI (acute kidney injury), Transaminitis, Hypotension, Acute encephalopathy, Hyperammonemia, Renal failure       She complains of ongoing low back pain that is chronic.  It is more of an issue since discharge from the hospital at which time her long-acting pain medication was discontinued due to concern for polypharmacy.  Her pain was therefore managed with short-acting pain medication.  Her husband, due to her significant pain, slowly introduced and increased her MS Contin own his own accord.  He notes a mild improvement in pain.  She is currently on MS Contin TID.  We had a long discussion today regarding her ongoing care.  We discussed goals of care and end of life issues today.  She notes that she  has never discussed these issues in the past with her husband or children.  She, in no confusing manner, reports that quality of life is most important to her.  Her current pain level is causing major interference with her QOL.  It is currently poorly controlled.  We broached the topic of Hospice and comfort care.  After a long discussion, this was decided to be the patient's best option. The patient's son, Brittany Blair, is very realistic and understanding of patients recent  decline.  We had a long conversation regarding Hospice.  Patient education was given regarding Hospice and the services they provide.  The patient understands that studies report that early enrollment in Hospice actually allows the patient to live longer compared to those who enroll in Hospice nearer to end of life.  Hospice will allow the patient to stay at home at end of life or go to a facility for end of life care.  At this point, the patient would like to remain at home.  Hospice provides the patient with a team of providers to help with care including physicians, nurses, aids, chaplains, and social workers.  Hospice's goal is to keep patients out of the hospital and comfortable by controlling symptoms with medications.  The patient is certainly Hospice appropriate with a life expectancy of less than 6 months.    Review of Systems  Constitutional: Positive for malaise/fatigue. Negative for chills and fever.  HENT: Negative.   Eyes: Negative.  Negative for blurred vision.  Respiratory: Negative.  Negative for cough.   Cardiovascular: Negative.  Negative for chest pain.  Gastrointestinal: Negative.  Negative for constipation, diarrhea, nausea and vomiting.  Genitourinary: Negative.  Negative for dysuria.  Musculoskeletal: Positive for back pain.  Skin: Negative.   Neurological: Positive for weakness.  Endo/Heme/Allergies: Negative.     Past Medical History:  Diagnosis Date  . Anxiety   . B12 deficiency 05/12/2015  . Breast cancer ([place]) dx'd 05/27/2010   chemo/xrt comp 01/2011  . Breast lump    left  . Carcinoma of breast metastatic to bone ([place]) 05/28/2010   This patient presented in April with an invasive ductal carcinoma that had broken through the skin and was contaminated with MRSA. She was stage III. It was lower inner quadrant. It was receptor positive, HER-2/neu negative, and had a KI-67 of 75%.  She was treated with neoadjuvant chemotherapy. She was recently hospitalized (June,  2012,) with multiple superficial MRSA infections. These have all r  . Depression   . Genital herpes 08/04/2012  . Hematoma   . Herpes   . Hypertension   . Lymph edema   . MRSA (methicillin resistant Staphylococcus aureus)   . Multiple blisters    along surgical site  . PMB (postmenopausal bleeding) 06/23/2013  . Swelling    left foot  . Vaginal delivery 1981, 1984, 1987, 1992    Past Surgical History:  Procedure Laterality Date  . BONE BIOPSY Left 11/2013   left post. hip  . CYSTOSCOPY N/A 10/14/2013   Procedure: CYSTOSCOPY;  Surgeon: Ena Dawley, MD;  Location: [facility];  Service: Gynecology;  Laterality: N/A;  . ESOPHAGOGASTRODUODENOSCOPY N/A 10/27/2013   Procedure: ESOPHAGOGASTRODUODENOSCOPY (EGD);  Surgeon: Wonda Horner, MD;  Location: [facility];  Service: Endoscopy;  Laterality: N/A;  . LAPAROSCOPIC ASSISTED VAGINAL HYSTERECTOMY Bilateral 10/14/2013   Procedure: LAPAROSCOPIC ASSISTED VAGINAL HYSTERECTOMY;  Surgeon: Ena Dawley, MD;  Location: [facility];  Service: Gynecology;  Laterality: Bilateral;  . LAPAROSCOPIC BILATERAL SALPINGO OOPHERECTOMY Bilateral 10/14/2013  Procedure: LAPAROSCOPIC BILATERAL SALPINGO OOPHORECTOMY;  Surgeon: Ena Dawley, MD;  Location: [facility];  Service: Gynecology;  Laterality: Bilateral;  . LAPAROSCOPIC LYSIS OF ADHESIONS N/A 10/14/2013   Procedure: LAPAROSCOPIC LYSIS OF ADHESIONS;  Surgeon: Ena Dawley, MD;  Location: [facility];  Service: Gynecology;  Laterality: N/A;  . MASTECTOMY MODIFIED RADICAL  10/10/10    left -Dr Margot Chimes  . TUBAL LIGATION    . WISDOM TOOTH EXTRACTION      Family History  Problem Relation Age of Onset  . Heart disease Father     heart attack  . Hypertension Daughter   . Hypertension Son   . Cancer Maternal Grandmother     ovarian  . Hypertension Maternal Grandfather   . Cancer Paternal Grandmother     ovarian  . Hypertension Paternal Grandfather     Social History   Social History  . Marital status: Married     Spouse name: N/A  . Number of children: N/A  . Years of education: N/A   Social History Main Topics  . Smoking status: Current Every Day Smoker    Packs/day: 0.35    Types: Cigarettes  . Smokeless tobacco: Never Used  . Alcohol use 4.2 oz/week    7 Glasses of wine per week     Comment: 2 days ago.   . Drug use: No  . Sexual activity: Not Currently    Birth control/ protection: None   Other Topics Concern  . None   Social History Narrative  . None     PHYSICAL EXAMINATION  ECOG PERFORMANCE STATUS: 3 - Symptomatic, >50% confined to bed  Vitals:   11/15/15 1519  BP: (!) 81/52  Pulse: (!) 109  Resp: 14  Temp: 97.6 F (36.4 C)    GENERAL:alert, crying, distressed, ill looking, moderate distress, obese, restless and accompanied by husband and son, Brittany Blair. Unkempt. SKIN: skin color, texture, turgor are normal, no rashes or significant lesions HEAD: Normocephalic, No masses, lesions, tenderness or abnormalities EYES: normal, EOMI, Conjunctiva are pink and non-injected EARS: External ears normal OROPHARYNX:lips, buccal mucosa, and tongue normal and mucous membranes are moist  NECK: supple, trachea midline LYMPH:  not examined BREAST:not examined LUNGS: not examined HEART: not examined ABDOMEN:obese BACK: Back symmetric, no curvature. EXTREMITIES:less then 2 second capillary refill, no skin discoloration, no cyanosis  NEURO: alert & oriented x 3 with fluent speech, no focal motor/sensory deficits, in wheelchair.   LABORATORY DATA: CBC    Component Value Date/Time   WBC 12.8 (H) 10/29/2015 0446   RBC 2.86 (L) 10/29/2015 0446   HGB 10.2 (L) 10/30/2015 2255   HGB 14.7 01/24/2012 1539   HCT 30.1 (L) 10/30/2015 2255   HCT 43.7 01/24/2012 1539   PLT 449 (H) 10/29/2015 0446   PLT 258 01/24/2012 1539   MCV 87.1 10/29/2015 0446   MCV 99.5 01/24/2012 1539   MCH 29.0 10/29/2015 0446   MCHC 33.3 10/29/2015 0446   RDW 15.6 (H) 10/29/2015 0446   RDW 14.1 01/24/2012  1539   LYMPHSABS 0.5 (L) 10/29/2015 0446   LYMPHSABS 1.4 01/24/2012 1539   MONOABS 1.4 (H) 10/29/2015 0446   MONOABS 0.6 01/24/2012 1539   EOSABS 0.1 10/29/2015 0446   EOSABS 0.3 01/24/2012 1539   BASOSABS 0.0 10/29/2015 0446   BASOSABS 0.0 01/24/2012 1539      Chemistry      Component Value Date/Time   NA 136 10/31/2015 0637   K 2.8 (L) 10/31/2015 0637   CL 103 10/31/2015 1610  CO2 20 (L) 10/31/2015 0637   BUN 10 10/31/2015 0637   CREATININE 1.26 (H) 10/31/2015 0637      Component Value Date/Time   CALCIUM 6.6 (L) 10/31/2015 0637   ALKPHOS 230 (H) 10/29/2015 0446   AST 56 (H) 10/29/2015 0446   ALT 47 10/29/2015 0446   BILITOT 0.8 10/29/2015 0446        PENDING LABS:   RADIOGRAPHIC STUDIES:  Ct Head Wo Contrast  Result Date: 10/16/2015 CLINICAL DATA:  Metastatic breast cancer, confusion, decreased appetite x 3-4 days EXAM: CT HEAD WITHOUT CONTRAST TECHNIQUE: Contiguous axial images were obtained from the base of the skull through the vertex without intravenous contrast. COMPARISON:  02/16/2014 FINDINGS: Brain: No evidence of acute infarction, hemorrhage, hydrocephalus, extra-axial collection or mass lesion/mass effect. Vascular: No hyperdense vessel or unexpected calcification. Skull: No evidence of calvarial fracture. Sinuses/Orbits: Layering fluid in the left sphenoid sinus. Visualized mastoid air cells are clear. Other: Mild subcortical white matter and periventricular small vessel ischemic changes. Mild cortical atrophy. No ventriculomegaly. IMPRESSION: No evidence of acute intracranial abnormality. Mild atrophy with small vessel ischemic changes. Electronically Signed   By: Julian Hy M.D.   On: 10/16/2015 18:38   Ct Head W & Wo Contrast  Result Date: 10/20/2015 CLINICAL DATA:  Acute encephalopathy. History of metastatic breast cancer EXAM: CT HEAD WITHOUT AND WITH CONTRAST TECHNIQUE: Contiguous axial images were obtained from the base of the skull through the  vertex without and with intravenous contrast CONTRAST:  33m ISOVUE-300 IOPAMIDOL (ISOVUE-300) INJECTION 61% COMPARISON:  MRI head 10/19/2015.  CT head 10/16/2015 FINDINGS: Brain: Generalized atrophy. Negative for acute or chronic infarction. Negative for hemorrhage or mass. No edema. No enhancing lesion identified postcontrast administration. Vascular: No hyperdense vessel or unexpected calcification. Skull: No lytic or sclerotic metastatic disease to the skull. Sinuses/Orbits: Air-fluid level sphenoid sinus unchanged. Remaining sinuses clear. Other: Negative IMPRESSION: No acute intracranial abnormality.  Negative for metastatic disease. Air-fluid level sphenoid sinus. Electronically Signed   By: CFranchot GalloM.D.   On: 10/20/2015 19:39   Mr Brain Wo Contrast  Result Date: 10/19/2015 CLINICAL DATA:  55year old female with increased confusion. Stage IV breast cancer. Subsequent encounter. EXAM: MRI HEAD WITHOUT CONTRAST TECHNIQUE: Multiplanar, multiecho pulse sequences of the brain and surrounding structures were obtained without intravenous contrast. COMPARISON:  Head CT without contrast 10/16/2015 and earlier. FINDINGS: Study is mildly degraded by motion artifact despite repeated imaging attempts. Major intracranial vascular flow voids are preserved. No restricted diffusion to suggest acute infarction. No midline shift, mass effect, evidence of mass lesion, ventriculomegaly, extra-axial collection or acute intracranial hemorrhage. Cervicomedullary junction and pituitary are within normal limits. No encephalomalacia. No chronic cerebral blood products. No contrast was administered, but no areas of cerebral edema are evident noncontrast gray and white matter signal is normal for age. Mildly heterogeneous bone marrow signal in the calvarium and upper cervical spine, but no destructive osseous lesion identified. Fluid level in the sphenoid sinus. Other paranasal sinuses and mastoids are well pneumatized.  Visible internal auditory structures appear normal. Negative orbit and scalp soft tissues. IMPRESSION: 1. No acute intracranial abnormality. Early metastatic disease to the brain is difficult to exclude in the absence of intravenous contrast. 2. Heterogeneous bone marrow signal which is nonspecific. No destructive osseous lesion identified. 3. Fluid level in the right sphenoid sinus suggesting sinusitis in the appropriate clinical setting. Electronically Signed   By: HGenevie AnnM.D.   On: 10/19/2015 12:32   UKoreaAbdomen Complete  Result  Date: 10/28/2015 CLINICAL DATA:  Sepsis. Acute kidney injury. LFTs elevated on admission yesterday. History of stage IV breast cancer with bone metastases. Hepatic steatosis. EXAM: ABDOMEN ULTRASOUND COMPLETE COMPARISON:  CT 03/31/2015 FINDINGS: Gallbladder: Gallbladder has a normal appearance. Gallbladder wall is 2.3 mm, within normal limits. No stones or pericholecystic fluid. No sonographic Murphy's sign. Common bile duct: Diameter: 3.0 mm Liver: The liver is echogenic. There is attenuation of the ultrasound wave, poor visualization of the internal hepatic architecture, and loss of definition of the diaphragm. No focal liver lesions are identified. IVC: No abnormality visualized. Pancreas: Visualized portion unremarkable. Spleen: Size and appearance within normal limits. Right Kidney: Length: 13.1 cm. Echogenicity within normal limits. No mass or hydronephrosis visualized. Left Kidney: Length: 13.7 cm. Echogenicity within normal limits. No mass or hydronephrosis visualized. Abdominal aorta: No aneurysm visualized. Distal abdominal aorta and bifurcation are obscured by bowel gas. Other findings: None. IMPRESSION: 1. No evidence for acute cholecystitis. 2. Hepatic steatosis. 3. No hydronephrosis. Electronically Signed   By: Nolon Nations M.D.   On: 10/28/2015 11:29   Dg Chest Portable 1 View  Result Date: 10/27/2015 CLINICAL DATA:  Altered mental status and sepsis. EXAM:  PORTABLE CHEST 1 VIEW COMPARISON:  10/16/2015 radiograph FINDINGS: Cardiomegaly and mild pulmonary vascular congestion noted. There is no evidence of focal airspace disease, pulmonary edema, suspicious pulmonary nodule/mass, pleural effusion, or pneumothorax. No acute bony abnormalities are identified. IMPRESSION: Cardiomegaly with mild pulmonary vascular congestion. Electronically Signed   By: Margarette Canada M.D.   On: 10/27/2015 21:19   Dg Chest Portable 1 View  Result Date: 10/16/2015 CLINICAL DATA:  Cancer metastatic to bone, right-sided hip pain. Confusion. History of left breast cancer. EXAM: PORTABLE CHEST 1 VIEW COMPARISON:  None. FINDINGS: Study is hypoinspiratory. Given the low lung volumes, in the slight patient obliquity, lungs appear clear. No evidence of pneumonia. No pleural effusion or pneumothorax seen. Heart size is upper normal, likely accentuated by the low lung volumes. Surgical clips are seen about the left axilla. Old healed rib fractures on the left were better seen on earlier CT. No evidence of acute osseous abnormality seen on today's chest x-ray. IMPRESSION: Low lung volumes.  No active disease seen. Electronically Signed   By: Franki Cabot M.D.   On: 10/16/2015 19:50     PATHOLOGY:    ASSESSMENT AND PLAN:  Carcinoma of breast metastatic to bone [facility]) Metastatic breast cancer, ER/PR+, Her2 NEGATIVE malignancy, on Letrozole and Ibrance 100 mg daily 3 weeks on and 1 week off every 4 weeks.  Tolerating well thus far.  On Xgeva therapy for bone metastases with Ca++ and Vit D to prevent hypocalcemia associated with Xgeva.  Recent decline in performance status and now stable/plateued, but with an ECOG of 3.  No role for labs today.  Unfortunately, Majel has been admitted to the hospital x 2.  Her performance status has declined significantly and she is nearly bed bound at this time.  She is requiring full assistance with transfer, bathrooming, etc.  She is nonambulatory at this  time.  As a result, her family members have been very taxed and stressed.  Given her failure to thrive, we have discussed options moving forward.  We discussed goals of care and she is clear in reporting that she wants to be at home, but comfortably.  Quantity of life in not important.  We discussed Hospice intervention with emphasis on comfort care.  After a long discussion, the patient and family are agreeable to  referral to Hospice.  She is advised that treatment will be discontinued.  She is agreeable to this, but this information did raise some concern from her husband.  Pain is a major issue for her.  During discussion she is seen wincing in pain and at one point tearful.  She is currently on MS Contin TID and Oxycodone PRN.  We can increase MS Contin to QID knowing that Hospice can change her pain medication for improved pain control.  Hypotension today is noted and I have advised her to discontinue her antihypertensives.  Referral to Hospice is completed and we will send a copy of today's dictation.  It has been asked that Hospice call's the patient's daughter-in-law Gretchen Portela who has been instrumental in coordinating the patient's care given that most family members are working full-time.  Interestingly, I learned that the patient's daughter is a recovering addict and is involved with AA and NA.  Today is the first day I am meeting her son, Brittany Blair.  He is very realistic regarding his mother's current state and recent decline.  No scheduled return appointment.   ORDERS PLACED FOR THIS ENCOUNTER: No orders of the defined types were placed in this encounter.   MEDICATIONS PRESCRIBED THIS ENCOUNTER: No orders of the defined types were placed in this encounter.   THERAPY PLAN:  Transition to comfort care with Hospice.  All questions were answered. The patient knows to call the clinic with any problems, questions or concerns. We can certainly see the patient much sooner if  necessary.  Patient and plan discussed with Dr. Ancil Linsey and she is in agreement with the aforementioned.   This note is electronically signed by: Doy Mince 11/15/2015 4:52 PM

## 2015-11-15 NOTE — Assessment & Plan Note (Addendum)
Metastatic breast cancer, ER/PR+, Her2 NEGATIVE malignancy, on Letrozole and Ibrance 100 mg daily 3 weeks on and 1 week off every 4 weeks.  Tolerating well thus far.  On Xgeva therapy for bone metastases with Ca++ and Vit D to prevent hypocalcemia associated with Xgeva.  Recent decline in performance status and now stable/plateued, but with an ECOG of 3.  No role for labs today.  Unfortunately, Brittany Blair has been admitted to the hospital x 2.  Her performance status has declined significantly and she is nearly bed bound at this time.  She is requiring full assistance with transfer, bathrooming, etc.  She is nonambulatory at this time.  As a result, her family members have been very taxed and stressed.  Given her failure to thrive, we have discussed options moving forward.  We discussed goals of care and she is clear in reporting that she wants to be at home, but comfortably.  Quantity of life in not important.  We discussed Hospice intervention with emphasis on comfort care.  After a long discussion, the patient and family are agreeable to referral to Hospice.  She is advised that treatment will be discontinued.  She is agreeable to this, but this information did raise some concern from her husband.  Pain is a major issue for her.  During discussion she is seen wincing in pain and at one point tearful.  She is currently on MS Contin TID and Oxycodone PRN.  We can increase MS Contin to QID knowing that Hospice can change her pain medication for improved pain control.  Hypotension today is noted and I have advised her to discontinue her antihypertensives.  Referral to Hospice is completed and we will send a copy of today's dictation.  It has been asked that Hospice call's the patient's daughter-in-law Brittany Blair who has been instrumental in coordinating the patient's care given that most family members are working full-time.  Interestingly, I learned that the patient's daughter is a recovering addict and is  involved with AA and NA.  Today is the first day I am meeting her son, Brittany Blair.  He is very realistic regarding his mother's current state and recent decline.  No scheduled return appointment.

## 2015-11-16 MED FILL — POTASSIUM CL ER 10 MEQ TAB: 10 | 7 days supply | Qty: 30 | Fill #1

## 2015-11-16 MED FILL — ACYCLOVIR 400 MG TABLET: 400 | 30 days supply | Qty: 60 | Fill #5

## 2015-11-20 MED FILL — VENLAFAXINE HCL ER 75 MG CA: 75 | 30 days supply | Qty: 90 | Fill #2

## 2015-11-21 ENCOUNTER — Other Ambulatory Visit (HOSPITAL_COMMUNITY): Payer: Self-pay | Admitting: Oncology

## 2015-11-22 ENCOUNTER — Other Ambulatory Visit (HOSPITAL_COMMUNITY): Payer: Self-pay | Admitting: Oncology

## 2015-11-22 DIAGNOSIS — C50919 Malignant neoplasm of unspecified site of unspecified female breast: Secondary | ICD-10-CM

## 2015-11-22 DIAGNOSIS — G893 Neoplasm related pain (acute) (chronic): Secondary | ICD-10-CM

## 2015-11-22 DIAGNOSIS — C7951 Secondary malignant neoplasm of bone: Principal | ICD-10-CM

## 2015-11-22 MED ORDER — MORPHINE SULFATE ER 60 MG PO TBCR: 60.0000 mg | EXTENDED_RELEASE_TABLET | Freq: Three times a day (TID) | ORAL | 0 refills | Status: AC

## 2015-12-27 DEATH — deceased

## 2016-02-03 ENCOUNTER — Other Ambulatory Visit: Payer: Self-pay | Admitting: Nurse Practitioner

## 2017-07-31 IMAGING — CR DG CHEST 1V PORT
1 series · 1 of 1 positions shown · non-contrast
Comparison: 10/16/2015 radiograph

CLINICAL DATA: Altered mental status and sepsis.

EXAM:
PORTABLE CHEST 1 VIEW

[ap portable]
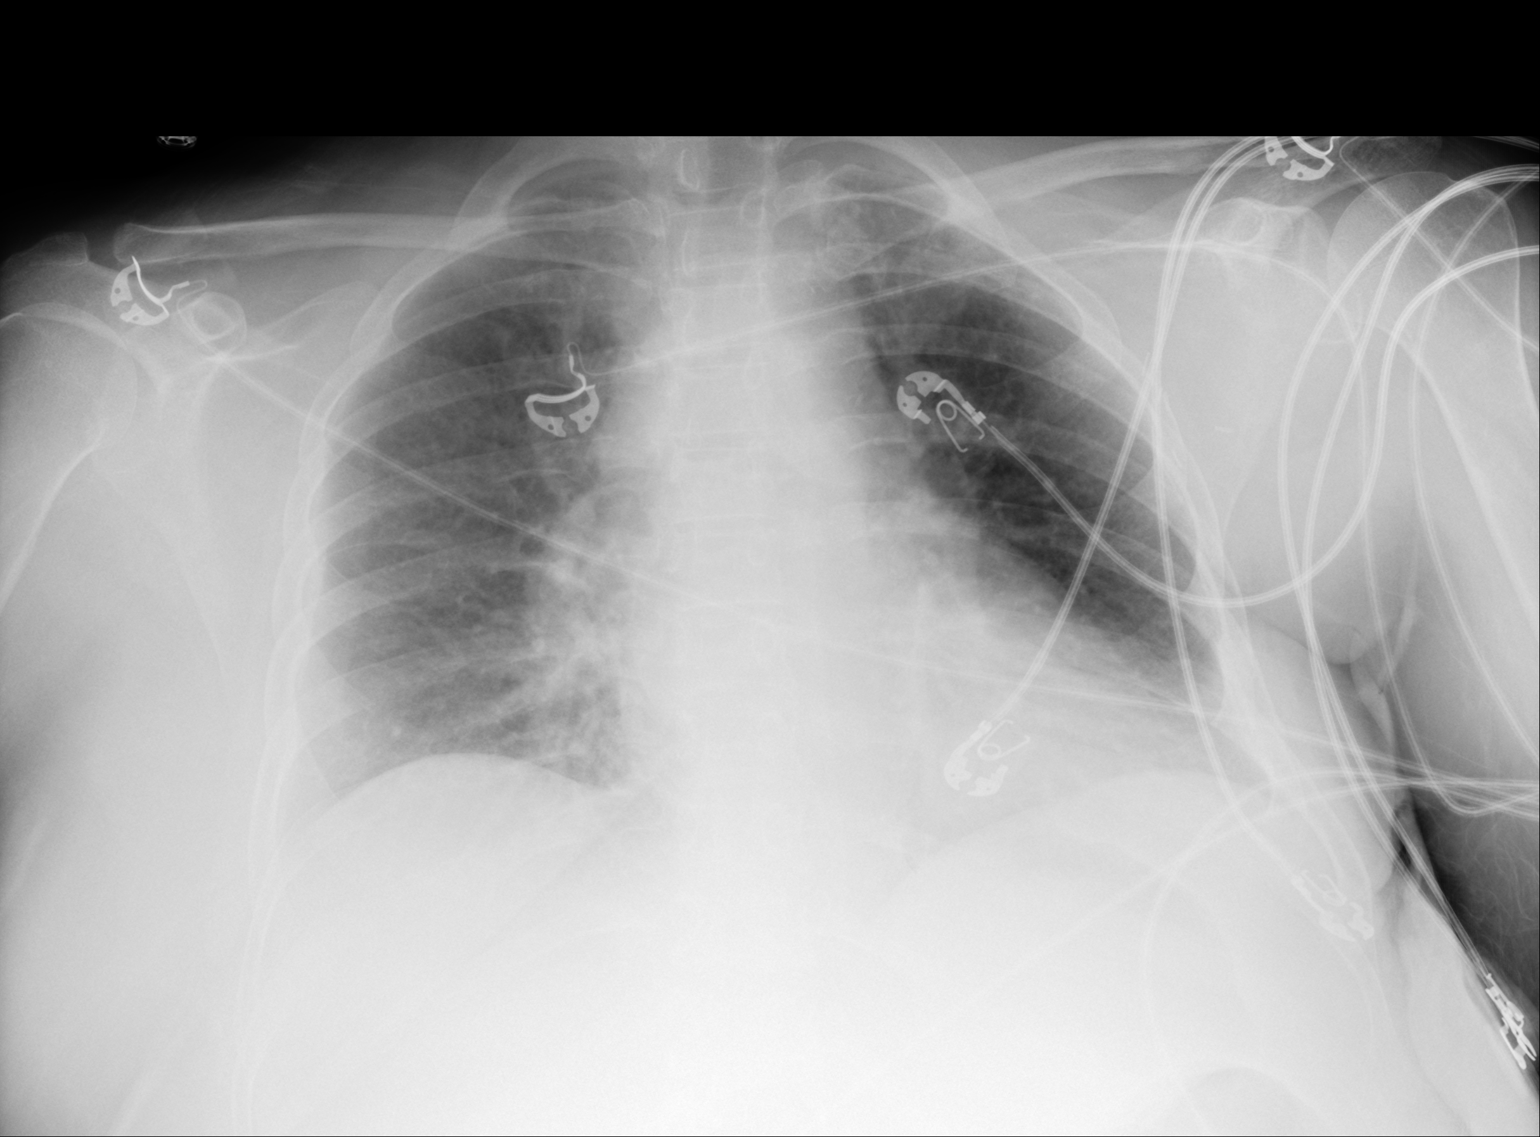

[1 of 1 positions shown; findings below may reference images not displayed]

FINDINGS: Cardiomegaly and mild pulmonary vascular congestion noted.

There is no evidence of focal airspace disease, pulmonary edema,
suspicious pulmonary nodule/mass, pleural effusion, or pneumothorax.

No acute bony abnormalities are identified.
IMPRESSION: Cardiomegaly with mild pulmonary vascular congestion.

## 2017-10-02 IMAGING — US US ABDOMEN COMPLETE
1 series · 13 of 25 positions shown · non-contrast
Comparison: CT 03/31/2015

CLINICAL DATA: Sepsis. Acute kidney injury. LFTs elevated on
admission yesterday. History of stage IV breast cancer with bone
metastases. Hepatic steatosis.

EXAM:
ABDOMEN ULTRASOUND COMPLETE

[Series 1: us abdomen complete · 0.22mm/px · 13 of 122 slices shown]
[im 1/122]
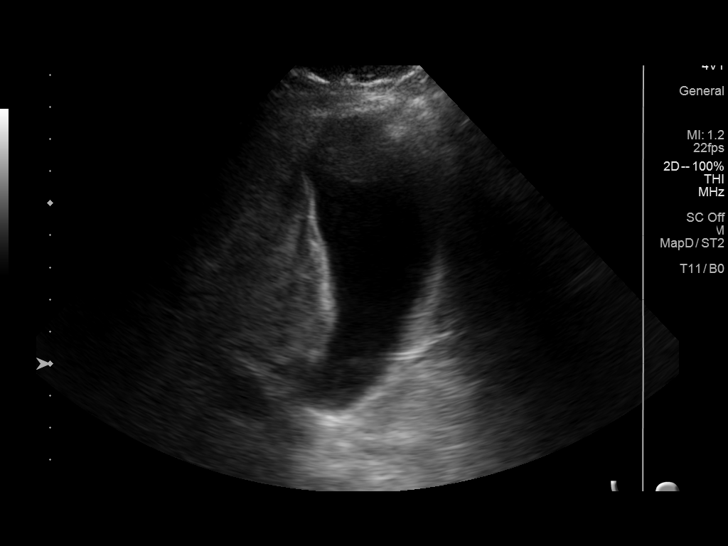
[im 11/122]
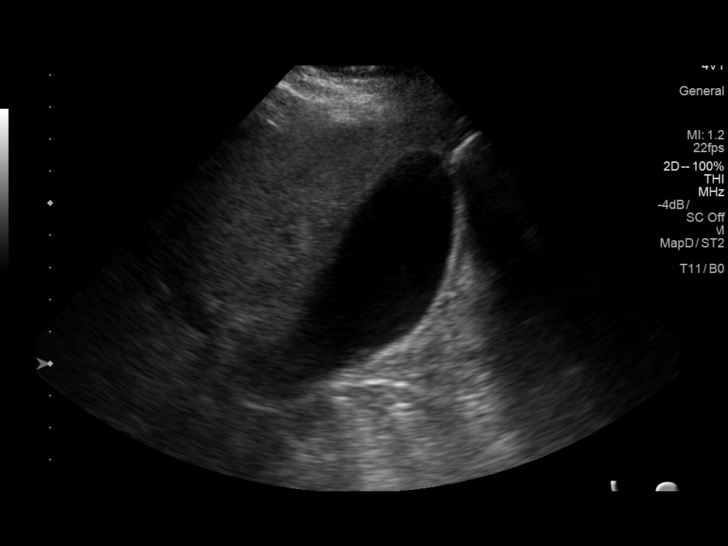
[im 21/122]
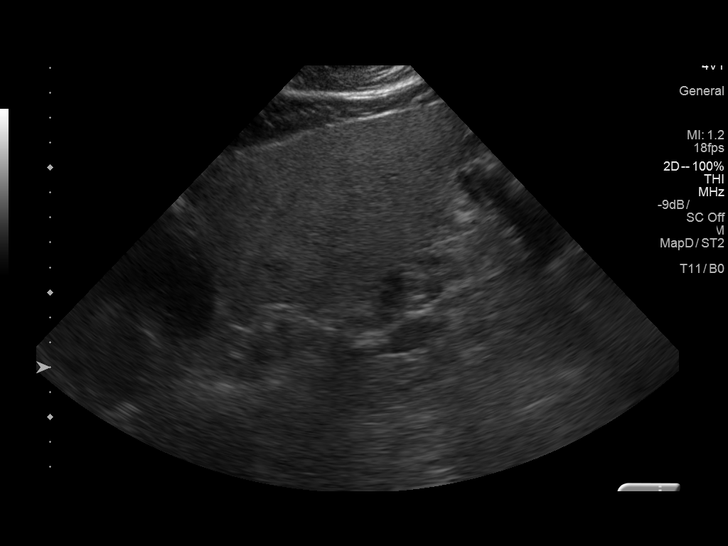
[im 31/122]
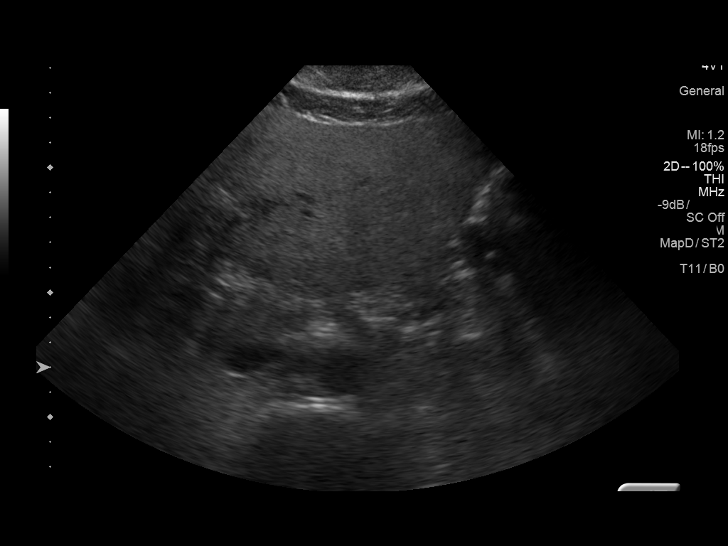
[im 41/122]
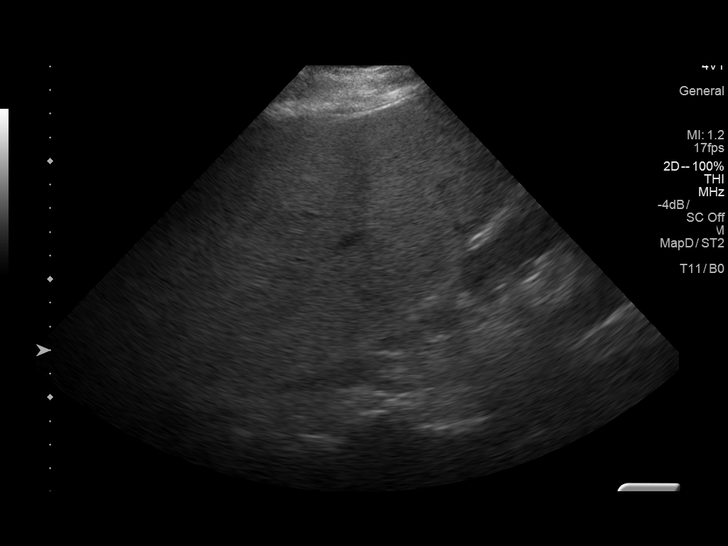
[im 51/122]
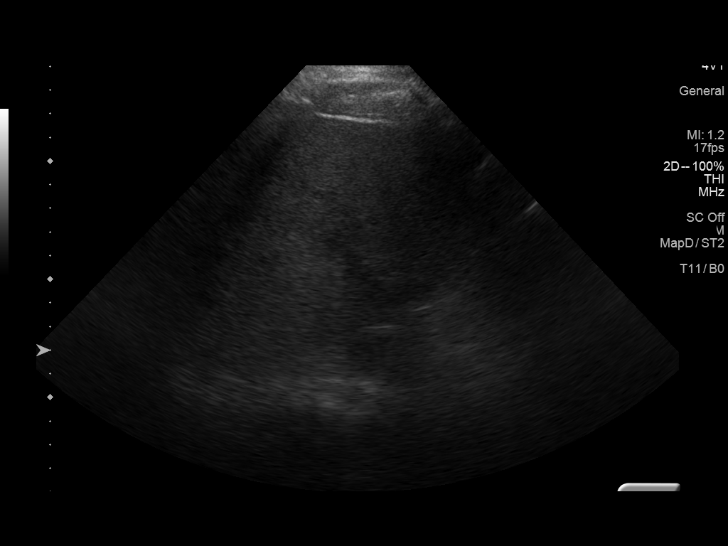
[im 61/122]
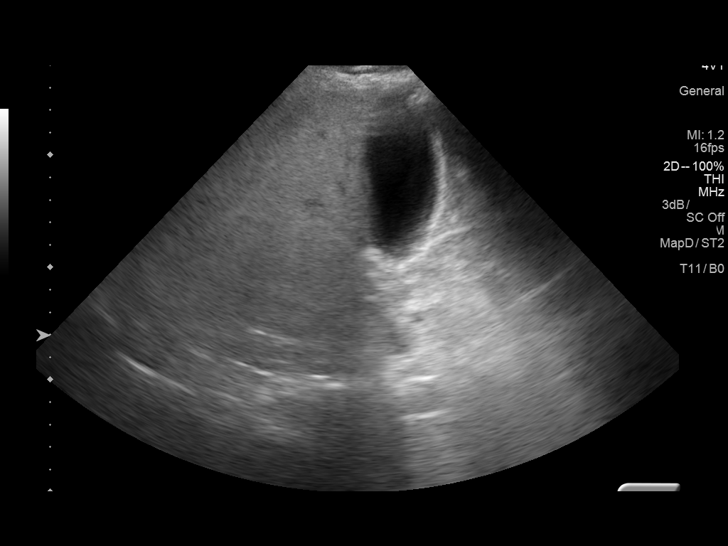
[im 71/122]
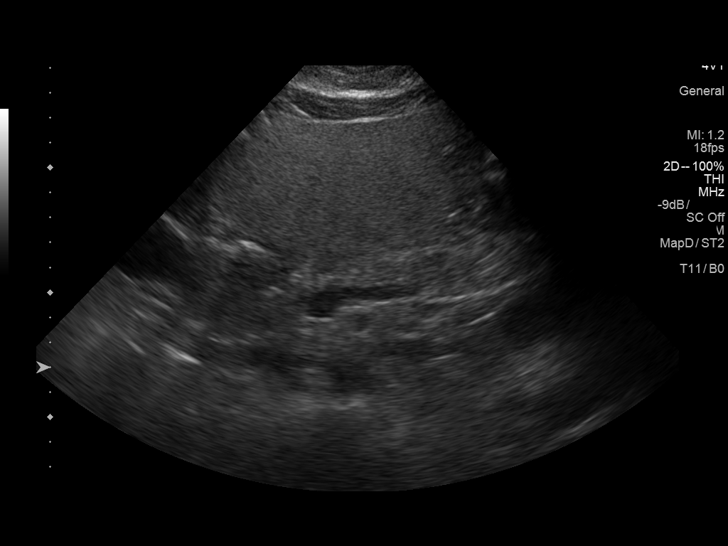
[im 81/122]
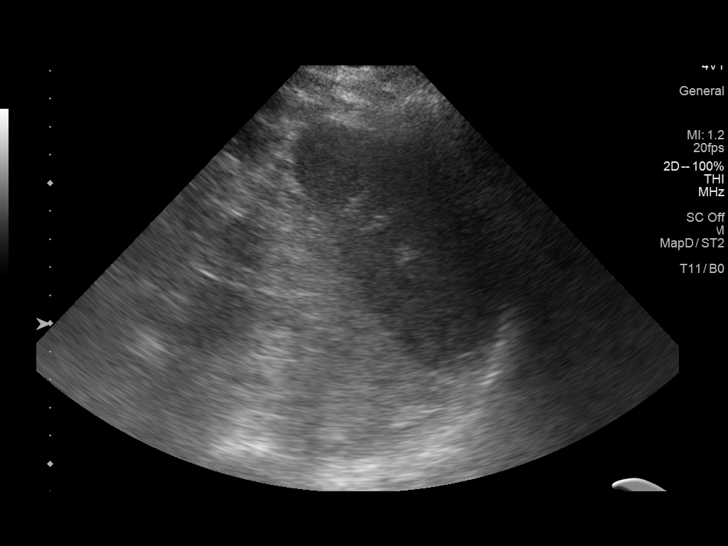
[im 91/122]
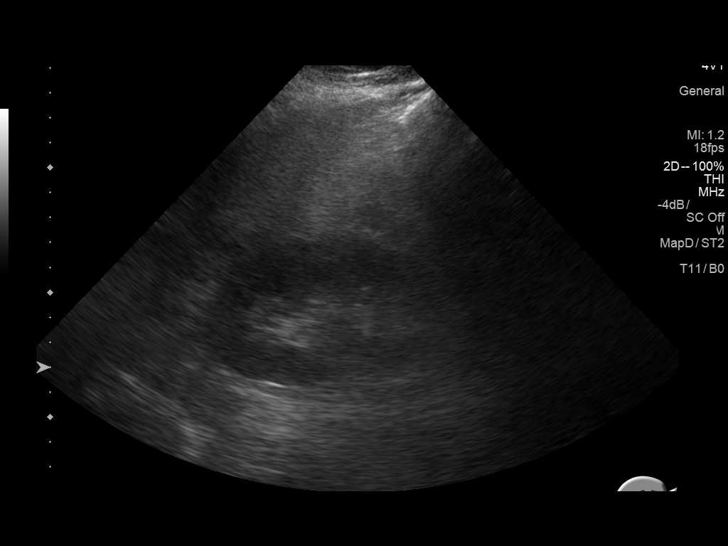
[im 101/122]
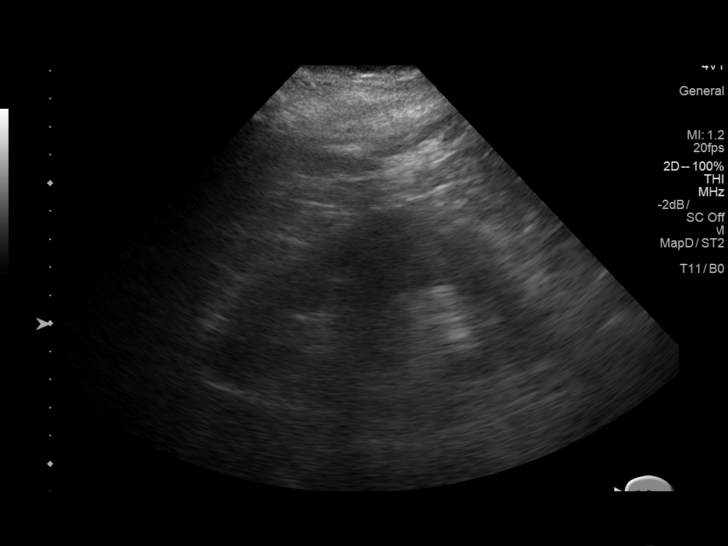
[im 111/122]
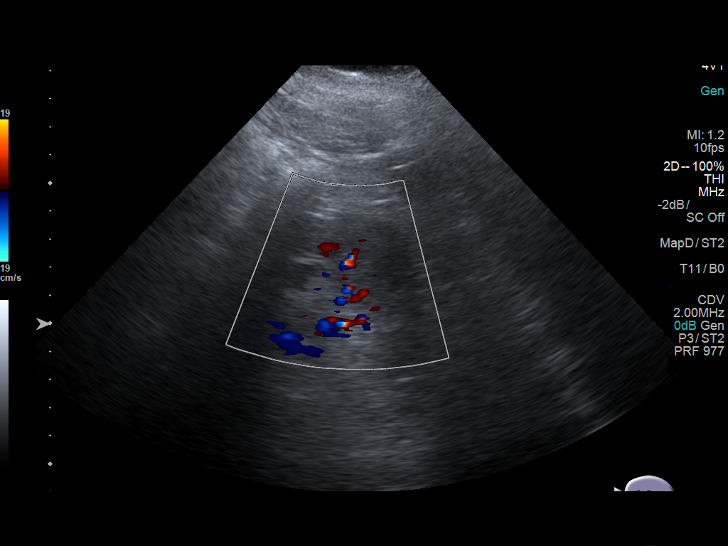
[im 122/122]
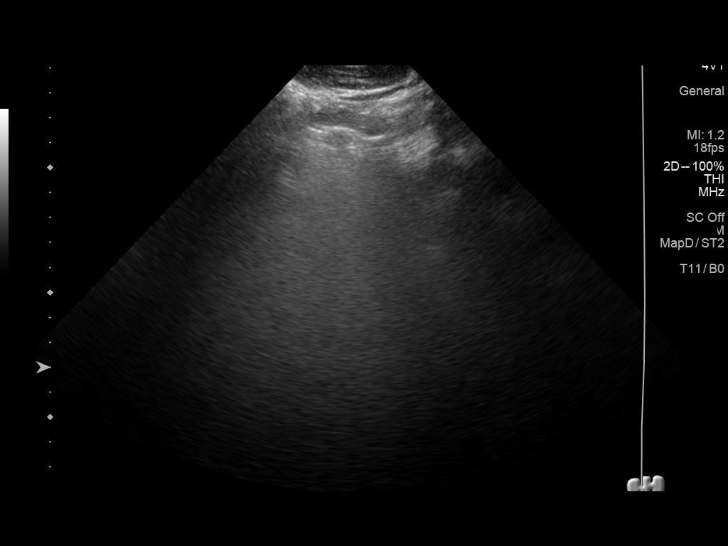

[13 of 25 positions shown; findings below may reference images not displayed]

FINDINGS: Gallbladder: Gallbladder has a normal appearance. Gallbladder wall
is 2.3 mm, within normal limits. No stones or pericholecystic fluid.
No sonographic Murphy's sign.

Common bile duct: Diameter: 3.0 mm

Liver: The liver is echogenic. There is attenuation of the
ultrasound wave, poor visualization of the internal hepatic
architecture, and loss of definition of the diaphragm. No focal
liver lesions are identified.

IVC: No abnormality visualized.

Pancreas: Visualized portion unremarkable.

Spleen: Size and appearance within normal limits.

Right Kidney: Length: 13.1 cm. Echogenicity within normal limits. No
mass or hydronephrosis visualized.

Left Kidney: Length: 13.7 cm. Echogenicity within normal limits. No
mass or hydronephrosis visualized.

Abdominal aorta: No aneurysm visualized. Distal abdominal aorta and
bifurcation are obscured by bowel gas.

Other findings: None.
IMPRESSION: 1. No evidence for acute cholecystitis.
2. Hepatic steatosis.
3. No hydronephrosis.
